# Patient Record
Sex: Female | Born: 1935
Health system: Southern US, Community
[De-identification: ages and names within clinical notes are randomized; demographics above are authoritative.]

## PROBLEM LIST (undated history)

## (undated) DIAGNOSIS — R011 Cardiac murmur, unspecified: Secondary | ICD-10-CM

## (undated) DIAGNOSIS — B019 Varicella without complication: Secondary | ICD-10-CM

## (undated) DIAGNOSIS — I4891 Unspecified atrial fibrillation: Secondary | ICD-10-CM

## (undated) DIAGNOSIS — F32A Depression, unspecified: Secondary | ICD-10-CM

## (undated) DIAGNOSIS — E039 Hypothyroidism, unspecified: Secondary | ICD-10-CM

## (undated) DIAGNOSIS — D649 Anemia, unspecified: Secondary | ICD-10-CM

## (undated) DIAGNOSIS — R001 Bradycardia, unspecified: Secondary | ICD-10-CM

## (undated) DIAGNOSIS — M47816 Spondylosis without myelopathy or radiculopathy, lumbar region: Secondary | ICD-10-CM

## (undated) DIAGNOSIS — K219 Gastro-esophageal reflux disease without esophagitis: Secondary | ICD-10-CM

## (undated) DIAGNOSIS — F329 Major depressive disorder, single episode, unspecified: Secondary | ICD-10-CM

## (undated) DIAGNOSIS — G8929 Other chronic pain: Secondary | ICD-10-CM

## (undated) DIAGNOSIS — R109 Unspecified abdominal pain: Secondary | ICD-10-CM

## (undated) DIAGNOSIS — E785 Hyperlipidemia, unspecified: Secondary | ICD-10-CM

## (undated) DIAGNOSIS — C4490 Unspecified malignant neoplasm of skin, unspecified: Secondary | ICD-10-CM

## (undated) DIAGNOSIS — K5792 Diverticulitis of intestine, part unspecified, without perforation or abscess without bleeding: Secondary | ICD-10-CM

## (undated) DIAGNOSIS — G8918 Other acute postprocedural pain: Secondary | ICD-10-CM

## (undated) DIAGNOSIS — M1990 Unspecified osteoarthritis, unspecified site: Secondary | ICD-10-CM

## (undated) DIAGNOSIS — I10 Essential (primary) hypertension: Secondary | ICD-10-CM

## (undated) DIAGNOSIS — I509 Heart failure, unspecified: Secondary | ICD-10-CM

## (undated) DIAGNOSIS — M7061 Trochanteric bursitis, right hip: Secondary | ICD-10-CM

## (undated) DIAGNOSIS — M5116 Intervertebral disc disorders with radiculopathy, lumbar region: Secondary | ICD-10-CM

## (undated) DIAGNOSIS — S52539A Colles' fracture of unspecified radius, initial encounter for closed fracture: Secondary | ICD-10-CM

## (undated) DIAGNOSIS — M169 Osteoarthritis of hip, unspecified: Secondary | ICD-10-CM

## (undated) HISTORY — DX: Cardiac murmur, unspecified: R01.1

## (undated) HISTORY — DX: Colles' fracture of unspecified radius, initial encounter for closed fracture: S52.539A

## (undated) HISTORY — PX: CATARACT EXTRACTION: SUR2

## (undated) HISTORY — DX: Major depressive disorder, single episode, unspecified: F32.9

## (undated) HISTORY — DX: Intervertebral disc disorders with radiculopathy, lumbar region: M51.16

## (undated) HISTORY — DX: Bradycardia, unspecified: R00.1

## (undated) HISTORY — DX: Other chronic pain: G89.29

## (undated) HISTORY — DX: Anemia, unspecified: D64.9

## (undated) HISTORY — DX: Essential (primary) hypertension: I10

## (undated) HISTORY — DX: Depression, unspecified: F32.A

## (undated) HISTORY — PX: PARTIAL HYSTERECTOMY: SHX80

## (undated) HISTORY — DX: Unspecified osteoarthritis, unspecified site: M19.90

## (undated) HISTORY — DX: Varicella without complication: B01.9

## (undated) HISTORY — DX: Other acute postprocedural pain: G89.18

## (undated) HISTORY — DX: Hyperlipidemia, unspecified: E78.5

## (undated) HISTORY — DX: Diverticulitis of intestine, part unspecified, without perforation or abscess without bleeding: K57.92

## (undated) HISTORY — PX: APPENDECTOMY: SHX54

## (undated) HISTORY — DX: Hypothyroidism, unspecified: E03.9

## (undated) HISTORY — DX: Unspecified atrial fibrillation: I48.91

## (undated) HISTORY — DX: Unspecified malignant neoplasm of skin, unspecified: C44.90

## (undated) HISTORY — DX: Osteoarthritis of hip, unspecified: M16.9

## (undated) HISTORY — DX: Trochanteric bursitis, right hip: M70.61

## (undated) HISTORY — DX: Gastro-esophageal reflux disease without esophagitis: K21.9

## (undated) HISTORY — PX: TONSILLECTOMY: SUR1361

## (undated) HISTORY — DX: Unspecified abdominal pain: R10.9

## (undated) HISTORY — DX: Spondylosis without myelopathy or radiculopathy, lumbar region: M47.816

## (undated) HISTORY — DX: Heart failure, unspecified: I50.9

---

## 2004-09-26 ENCOUNTER — Ambulatory Visit: Payer: Self-pay | Admitting: Family Medicine

## 2005-02-23 ENCOUNTER — Ambulatory Visit: Payer: Self-pay | Admitting: Family Medicine

## 2005-04-16 ENCOUNTER — Ambulatory Visit: Payer: Self-pay | Admitting: Family Medicine

## 2005-10-17 ENCOUNTER — Ambulatory Visit: Payer: Self-pay | Admitting: Ophthalmology

## 2005-10-17 ENCOUNTER — Other Ambulatory Visit: Payer: Self-pay

## 2005-10-19 ENCOUNTER — Ambulatory Visit: Payer: Self-pay | Admitting: Family Medicine

## 2005-10-22 ENCOUNTER — Ambulatory Visit: Payer: Self-pay | Admitting: Family Medicine

## 2005-10-23 ENCOUNTER — Ambulatory Visit: Payer: Self-pay | Admitting: Ophthalmology

## 2005-11-22 ENCOUNTER — Ambulatory Visit: Payer: Self-pay | Admitting: Ophthalmology

## 2005-12-04 ENCOUNTER — Ambulatory Visit: Payer: Self-pay | Admitting: Ophthalmology

## 2006-04-02 ENCOUNTER — Ambulatory Visit: Payer: Self-pay | Admitting: Family Medicine

## 2006-04-17 ENCOUNTER — Ambulatory Visit: Payer: Self-pay | Admitting: Physician Assistant

## 2006-05-13 ENCOUNTER — Ambulatory Visit: Payer: Self-pay | Admitting: General Surgery

## 2006-05-28 ENCOUNTER — Ambulatory Visit: Payer: Self-pay | Admitting: Anesthesiology

## 2006-07-15 ENCOUNTER — Ambulatory Visit: Payer: Self-pay | Admitting: Anesthesiology

## 2006-07-31 ENCOUNTER — Ambulatory Visit: Payer: Self-pay | Admitting: Anesthesiology

## 2006-10-16 ENCOUNTER — Ambulatory Visit: Payer: Self-pay | Admitting: Anesthesiology

## 2006-11-13 ENCOUNTER — Ambulatory Visit: Payer: Self-pay | Admitting: Anesthesiology

## 2007-03-18 ENCOUNTER — Ambulatory Visit: Payer: Self-pay | Admitting: Internal Medicine

## 2007-04-08 ENCOUNTER — Ambulatory Visit: Payer: Self-pay | Admitting: Physical Medicine & Rehabilitation

## 2007-04-10 ENCOUNTER — Ambulatory Visit: Payer: Self-pay | Admitting: Family Medicine

## 2007-04-21 ENCOUNTER — Ambulatory Visit: Payer: Self-pay | Admitting: Physical Medicine & Rehabilitation

## 2007-07-18 ENCOUNTER — Ambulatory Visit: Payer: Self-pay | Admitting: Physical Medicine & Rehabilitation

## 2007-09-03 ENCOUNTER — Ambulatory Visit: Payer: Self-pay | Admitting: Physical Medicine & Rehabilitation

## 2007-12-16 ENCOUNTER — Ambulatory Visit: Payer: Self-pay | Admitting: Internal Medicine

## 2008-04-13 ENCOUNTER — Ambulatory Visit: Payer: Self-pay | Admitting: Internal Medicine

## 2008-06-26 ENCOUNTER — Emergency Department: Payer: Self-pay | Admitting: Emergency Medicine

## 2008-07-20 ENCOUNTER — Ambulatory Visit: Payer: Self-pay | Admitting: Podiatry

## 2009-04-28 ENCOUNTER — Ambulatory Visit: Payer: Self-pay | Admitting: Internal Medicine

## 2009-06-16 ENCOUNTER — Emergency Department: Payer: Self-pay | Admitting: Emergency Medicine

## 2010-02-24 ENCOUNTER — Ambulatory Visit: Payer: Self-pay | Admitting: Internal Medicine

## 2010-05-04 ENCOUNTER — Ambulatory Visit: Payer: Self-pay | Admitting: Internal Medicine

## 2010-07-02 ENCOUNTER — Emergency Department: Payer: Self-pay | Admitting: Emergency Medicine

## 2010-12-19 ENCOUNTER — Ambulatory Visit: Payer: Self-pay

## 2010-12-25 ENCOUNTER — Emergency Department: Payer: Self-pay | Admitting: Emergency Medicine

## 2011-05-22 ENCOUNTER — Ambulatory Visit: Payer: Self-pay | Admitting: Internal Medicine

## 2012-02-08 ENCOUNTER — Ambulatory Visit: Payer: Self-pay | Admitting: Physician Assistant

## 2012-06-11 ENCOUNTER — Ambulatory Visit: Payer: Self-pay | Admitting: Internal Medicine

## 2012-10-21 DIAGNOSIS — Z87898 Personal history of other specified conditions: Secondary | ICD-10-CM | POA: Insufficient documentation

## 2012-10-21 DIAGNOSIS — Z85828 Personal history of other malignant neoplasm of skin: Secondary | ICD-10-CM | POA: Insufficient documentation

## 2012-10-24 ENCOUNTER — Ambulatory Visit: Payer: Self-pay | Admitting: Neurological Surgery

## 2012-12-12 ENCOUNTER — Ambulatory Visit: Payer: Self-pay

## 2013-06-16 ENCOUNTER — Ambulatory Visit: Payer: Self-pay | Admitting: Internal Medicine

## 2013-11-02 ENCOUNTER — Emergency Department: Payer: Self-pay | Admitting: Emergency Medicine

## 2013-11-02 LAB — TROPONIN I: Troponin-I: <0.02 ng/mL

## 2013-11-02 LAB — CBC
HCT: 40.7 % (ref 35.0–47.0)
HGB: 13.4 g/dL (ref 12.0–16.0)
MCH: 32.4 pg (ref 26.0–34.0)
MCHC: 33 g/dL (ref 32.0–36.0)
MCV: 98 fL (ref 80–100)
PLATELETS: 221 10*3/uL (ref 150–440)
RBC: 4.13 10*6/uL (ref 3.80–5.20)
RDW: 14.1 % (ref 11.5–14.5)
WBC: 11.3 10*3/uL — ABNORMAL HIGH (ref 3.6–11.0)

## 2013-11-02 LAB — COMPREHENSIVE METABOLIC PANEL
ALBUMIN: 3.9 g/dL (ref 3.4–5.0)
ALK PHOS: 56 U/L
ALT: 19 U/L (ref 12–78)
Anion Gap: 4 — ABNORMAL LOW (ref 7–16)
BUN: 18 mg/dL (ref 7–18)
Bilirubin,Total: 0.8 mg/dL (ref 0.2–1.0)
CALCIUM: 9 mg/dL (ref 8.5–10.1)
CHLORIDE: 98 mmol/L (ref 98–107)
Co2: 28 mmol/L (ref 21–32)
Creatinine: 0.66 mg/dL (ref 0.60–1.30)
EGFR (African American): >60
EGFR (Non-African Amer.): >60
Glucose: 128 mg/dL — ABNORMAL HIGH (ref 65–99)
OSMOLALITY: 264 (ref 275–301)
Potassium: 3.8 mmol/L (ref 3.5–5.1)
SGOT(AST): 24 U/L (ref 15–37)
Sodium: 130 mmol/L — ABNORMAL LOW (ref 136–145)
TOTAL PROTEIN: 7.6 g/dL (ref 6.4–8.2)

## 2013-11-02 LAB — URINALYSIS, COMPLETE
Bacteria: NONE SEEN
Bilirubin,UR: NEGATIVE
Glucose,UR: NEGATIVE mg/dL (ref 0–75)
KETONE: NEGATIVE
LEUKOCYTE ESTERASE: NEGATIVE
Nitrite: NEGATIVE
PROTEIN: NEGATIVE
Ph: 6 (ref 4.5–8.0)
Specific Gravity: 1.017 (ref 1.003–1.030)
Squamous Epithelial: NONE SEEN
WBC UR: <1 /HPF (ref 0–5)

## 2013-11-02 LAB — LIPASE, BLOOD: Lipase: 181 U/L (ref 73–393)

## 2013-11-18 DIAGNOSIS — M5116 Intervertebral disc disorders with radiculopathy, lumbar region: Secondary | ICD-10-CM

## 2013-11-18 DIAGNOSIS — M5136 Other intervertebral disc degeneration, lumbar region: Secondary | ICD-10-CM | POA: Insufficient documentation

## 2013-11-18 DIAGNOSIS — M7061 Trochanteric bursitis, right hip: Secondary | ICD-10-CM

## 2013-11-18 DIAGNOSIS — M47816 Spondylosis without myelopathy or radiculopathy, lumbar region: Secondary | ICD-10-CM

## 2013-11-18 HISTORY — DX: Spondylosis without myelopathy or radiculopathy, lumbar region: M47.816

## 2013-11-18 HISTORY — DX: Intervertebral disc disorders with radiculopathy, lumbar region: M51.16

## 2013-11-18 HISTORY — DX: Trochanteric bursitis, right hip: M70.61

## 2014-01-16 DIAGNOSIS — F32A Depression, unspecified: Secondary | ICD-10-CM | POA: Insufficient documentation

## 2014-01-16 DIAGNOSIS — F329 Major depressive disorder, single episode, unspecified: Secondary | ICD-10-CM | POA: Insufficient documentation

## 2014-01-16 DIAGNOSIS — F419 Anxiety disorder, unspecified: Secondary | ICD-10-CM

## 2014-01-16 DIAGNOSIS — G8929 Other chronic pain: Secondary | ICD-10-CM

## 2014-01-16 DIAGNOSIS — F3341 Major depressive disorder, recurrent, in partial remission: Secondary | ICD-10-CM | POA: Insufficient documentation

## 2014-01-16 HISTORY — DX: Other chronic pain: G89.29

## 2014-02-05 ENCOUNTER — Inpatient Hospital Stay: Payer: Self-pay | Admitting: Internal Medicine

## 2014-02-05 LAB — URINALYSIS, COMPLETE
BLOOD: NEGATIVE
Bacteria: NONE SEEN
Bilirubin,UR: NEGATIVE
GLUCOSE, UR: NEGATIVE mg/dL (ref 0–75)
Leukocyte Esterase: NEGATIVE
NITRITE: NEGATIVE
PH: 5 (ref 4.5–8.0)
Protein: 30
Specific Gravity: 1.025 (ref 1.003–1.030)
Squamous Epithelial: <1
WBC UR: <1 /HPF (ref 0–5)

## 2014-02-05 LAB — BASIC METABOLIC PANEL
Anion Gap: 11 (ref 7–16)
BUN: 24 mg/dL — ABNORMAL HIGH (ref 7–18)
CO2: 22 mmol/L (ref 21–32)
CREATININE: 1 mg/dL (ref 0.60–1.30)
Calcium, Total: 9 mg/dL (ref 8.5–10.1)
Chloride: 100 mmol/L (ref 98–107)
EGFR (African American): 59 — ABNORMAL LOW
GFR CALC NON AF AMER: 51 — AB
GLUCOSE: 241 mg/dL — AB (ref 65–99)
OSMOLALITY: 278 (ref 275–301)
POTASSIUM: 4.7 mmol/L (ref 3.5–5.1)
SODIUM: 133 mmol/L — AB (ref 136–145)

## 2014-02-05 LAB — CBC
HCT: 41.8 % (ref 35.0–47.0)
HGB: 13.3 g/dL (ref 12.0–16.0)
MCH: 32.3 pg (ref 26.0–34.0)
MCHC: 31.8 g/dL — ABNORMAL LOW (ref 32.0–36.0)
MCV: 102 fL — ABNORMAL HIGH (ref 80–100)
PLATELETS: 242 10*3/uL (ref 150–440)
RBC: 4.12 10*6/uL (ref 3.80–5.20)
RDW: 13.6 % (ref 11.5–14.5)
WBC: 10.9 10*3/uL (ref 3.6–11.0)

## 2014-02-05 LAB — PROTIME-INR
INR: 1.3
Prothrombin Time: 15.5 secs — ABNORMAL HIGH (ref 11.5–14.7)

## 2014-02-05 LAB — CK TOTAL AND CKMB (NOT AT ARMC)
CK, TOTAL: 31 U/L
CK, TOTAL: 31 U/L
CK, Total: 66 U/L
CK-MB: 1.8 ng/mL (ref 0.5–3.6)
CK-MB: 2.1 ng/mL (ref 0.5–3.6)
CK-MB: 2.2 ng/mL (ref 0.5–3.6)

## 2014-02-05 LAB — TROPONIN I
Troponin-I: <0.02 ng/mL
Troponin-I: <0.02 ng/mL

## 2014-02-05 LAB — TSH: Thyroid Stimulating Horm: 3.54 u[IU]/mL

## 2014-02-05 LAB — APTT

## 2014-02-06 DIAGNOSIS — I059 Rheumatic mitral valve disease, unspecified: Secondary | ICD-10-CM

## 2014-02-06 LAB — CBC WITH DIFFERENTIAL/PLATELET
BASOS PCT: 0.4 %
Basophil #: 0 10*3/uL (ref 0.0–0.1)
Eosinophil #: 0.1 10*3/uL (ref 0.0–0.7)
Eosinophil %: 0.7 %
HCT: 35.4 % (ref 35.0–47.0)
HGB: 11.9 g/dL — ABNORMAL LOW (ref 12.0–16.0)
LYMPHS ABS: 2.4 10*3/uL (ref 1.0–3.6)
Lymphocyte %: 27.3 %
MCH: 33 pg (ref 26.0–34.0)
MCHC: 33.7 g/dL (ref 32.0–36.0)
MCV: 98 fL (ref 80–100)
Monocyte #: 0.8 x10 3/mm (ref 0.2–0.9)
Monocyte %: 8.4 %
NEUTROS ABS: 5.7 10*3/uL (ref 1.4–6.5)
NEUTROS PCT: 63.2 %
Platelet: 204 10*3/uL (ref 150–440)
RBC: 3.61 10*6/uL — ABNORMAL LOW (ref 3.80–5.20)
RDW: 13.6 % (ref 11.5–14.5)
WBC: 9 10*3/uL (ref 3.6–11.0)

## 2014-02-06 LAB — BASIC METABOLIC PANEL
Anion Gap: 7 (ref 7–16)
BUN: 22 mg/dL — ABNORMAL HIGH (ref 7–18)
Calcium, Total: 8.1 mg/dL — ABNORMAL LOW (ref 8.5–10.1)
Chloride: 100 mmol/L (ref 98–107)
Co2: 28 mmol/L (ref 21–32)
Creatinine: 0.73 mg/dL (ref 0.60–1.30)
EGFR (African American): >60
GLUCOSE: 83 mg/dL (ref 65–99)
OSMOLALITY: 273 (ref 275–301)
POTASSIUM: 3.3 mmol/L — AB (ref 3.5–5.1)
Sodium: 135 mmol/L — ABNORMAL LOW (ref 136–145)

## 2014-02-07 LAB — CBC WITH DIFFERENTIAL/PLATELET
BASOS ABS: 0 10*3/uL (ref 0.0–0.1)
BASOS PCT: 0.4 %
EOS PCT: 0.8 %
Eosinophil #: 0.1 10*3/uL (ref 0.0–0.7)
HCT: 36.9 % (ref 35.0–47.0)
HGB: 12.6 g/dL (ref 12.0–16.0)
LYMPHS ABS: 2.4 10*3/uL (ref 1.0–3.6)
Lymphocyte %: 22.6 %
MCH: 33.7 pg (ref 26.0–34.0)
MCHC: 34 g/dL (ref 32.0–36.0)
MCV: 99 fL (ref 80–100)
Monocyte #: 1 x10 3/mm — ABNORMAL HIGH (ref 0.2–0.9)
Monocyte %: 9.4 %
NEUTROS ABS: 7 10*3/uL — AB (ref 1.4–6.5)
NEUTROS PCT: 66.8 %
PLATELETS: 232 10*3/uL (ref 150–440)
RBC: 3.73 10*6/uL — ABNORMAL LOW (ref 3.80–5.20)
RDW: 13.5 % (ref 11.5–14.5)
WBC: 10.5 10*3/uL (ref 3.6–11.0)

## 2014-02-07 LAB — BASIC METABOLIC PANEL
Anion Gap: 10 (ref 7–16)
BUN: 21 mg/dL — ABNORMAL HIGH (ref 7–18)
CALCIUM: 8.5 mg/dL (ref 8.5–10.1)
CREATININE: 0.59 mg/dL — AB (ref 0.60–1.30)
Chloride: 99 mmol/L (ref 98–107)
Co2: 28 mmol/L (ref 21–32)
EGFR (African American): >60
Glucose: 89 mg/dL (ref 65–99)
OSMOLALITY: 276 (ref 275–301)
POTASSIUM: 3.9 mmol/L (ref 3.5–5.1)
SODIUM: 137 mmol/L (ref 136–145)

## 2014-02-08 LAB — CBC WITH DIFFERENTIAL/PLATELET
BASOS ABS: 0.1 10*3/uL (ref 0.0–0.1)
Basophil %: 0.8 %
Eosinophil #: 0.1 10*3/uL (ref 0.0–0.7)
Eosinophil %: 0.8 %
HCT: 36.5 % (ref 35.0–47.0)
HGB: 12.2 g/dL (ref 12.0–16.0)
Lymphocyte #: 1.5 10*3/uL (ref 1.0–3.6)
Lymphocyte %: 16.8 %
MCH: 33 pg (ref 26.0–34.0)
MCHC: 33.3 g/dL (ref 32.0–36.0)
MCV: 99 fL (ref 80–100)
MONO ABS: 0.7 x10 3/mm (ref 0.2–0.9)
Monocyte %: 8 %
NEUTROS ABS: 6.7 10*3/uL — AB (ref 1.4–6.5)
Neutrophil %: 73.6 %
Platelet: 235 10*3/uL (ref 150–440)
RBC: 3.69 10*6/uL — AB (ref 3.80–5.20)
RDW: 13.4 % (ref 11.5–14.5)
WBC: 9.1 10*3/uL (ref 3.6–11.0)

## 2014-02-08 LAB — BASIC METABOLIC PANEL
Anion Gap: 7 (ref 7–16)
BUN: 14 mg/dL (ref 7–18)
CALCIUM: 8.3 mg/dL — AB (ref 8.5–10.1)
CREATININE: 0.57 mg/dL — AB (ref 0.60–1.30)
Chloride: 99 mmol/L (ref 98–107)
Co2: 31 mmol/L (ref 21–32)
EGFR (Non-African Amer.): >60
GLUCOSE: 101 mg/dL — AB (ref 65–99)
Osmolality: 274 (ref 275–301)
Potassium: 4 mmol/L (ref 3.5–5.1)
SODIUM: 137 mmol/L (ref 136–145)

## 2014-02-09 LAB — BASIC METABOLIC PANEL
ANION GAP: 8 (ref 7–16)
BUN: 19 mg/dL — ABNORMAL HIGH (ref 7–18)
CALCIUM: 8.4 mg/dL — AB (ref 8.5–10.1)
CREATININE: 0.62 mg/dL (ref 0.60–1.30)
Chloride: 99 mmol/L (ref 98–107)
Co2: 34 mmol/L — ABNORMAL HIGH (ref 21–32)
EGFR (Non-African Amer.): >60
GLUCOSE: 95 mg/dL (ref 65–99)
Osmolality: 283 (ref 275–301)
Potassium: 3.5 mmol/L (ref 3.5–5.1)
Sodium: 141 mmol/L (ref 136–145)

## 2014-02-09 LAB — CBC WITH DIFFERENTIAL/PLATELET
BASOS PCT: 0.6 %
Basophil #: 0 10*3/uL (ref 0.0–0.1)
Eosinophil #: 0.1 10*3/uL (ref 0.0–0.7)
Eosinophil %: 2 %
HCT: 36.2 % (ref 35.0–47.0)
HGB: 11.7 g/dL — AB (ref 12.0–16.0)
Lymphocyte #: 2.1 10*3/uL (ref 1.0–3.6)
Lymphocyte %: 28.3 %
MCH: 32.3 pg (ref 26.0–34.0)
MCHC: 32.4 g/dL (ref 32.0–36.0)
MCV: 100 fL (ref 80–100)
Monocyte #: 0.7 x10 3/mm (ref 0.2–0.9)
Monocyte %: 8.8 %
Neutrophil #: 4.5 10*3/uL (ref 1.4–6.5)
Neutrophil %: 60.3 %
PLATELETS: 248 10*3/uL (ref 150–440)
RBC: 3.63 10*6/uL — ABNORMAL LOW (ref 3.80–5.20)
RDW: 13.7 % (ref 11.5–14.5)
WBC: 7.5 10*3/uL (ref 3.6–11.0)

## 2014-02-10 LAB — CBC WITH DIFFERENTIAL/PLATELET
Basophil #: 0.1 10*3/uL (ref 0.0–0.1)
Basophil %: 0.8 %
EOS ABS: 0.2 10*3/uL (ref 0.0–0.7)
Eosinophil %: 2.1 %
HCT: 36.1 % (ref 35.0–47.0)
HGB: 11.7 g/dL — AB (ref 12.0–16.0)
Lymphocyte #: 2.2 10*3/uL (ref 1.0–3.6)
Lymphocyte %: 30.3 %
MCH: 32.3 pg (ref 26.0–34.0)
MCHC: 32.5 g/dL (ref 32.0–36.0)
MCV: 99 fL (ref 80–100)
MONO ABS: 0.6 x10 3/mm (ref 0.2–0.9)
Monocyte %: 8.6 %
Neutrophil #: 4.2 10*3/uL (ref 1.4–6.5)
Neutrophil %: 58.2 %
PLATELETS: 276 10*3/uL (ref 150–440)
RBC: 3.64 10*6/uL — AB (ref 3.80–5.20)
RDW: 13.8 % (ref 11.5–14.5)
WBC: 7.2 10*3/uL (ref 3.6–11.0)

## 2014-02-10 LAB — BASIC METABOLIC PANEL
Anion Gap: 9 (ref 7–16)
BUN: 19 mg/dL — ABNORMAL HIGH (ref 7–18)
CALCIUM: 8.5 mg/dL (ref 8.5–10.1)
Chloride: 96 mmol/L — ABNORMAL LOW (ref 98–107)
Co2: 34 mmol/L — ABNORMAL HIGH (ref 21–32)
Creatinine: 0.65 mg/dL (ref 0.60–1.30)
GLUCOSE: 97 mg/dL (ref 65–99)
OSMOLALITY: 280 (ref 275–301)
POTASSIUM: 4 mmol/L (ref 3.5–5.1)
Sodium: 139 mmol/L (ref 136–145)

## 2014-02-11 LAB — CBC WITH DIFFERENTIAL/PLATELET
Basophil #: 0.1 10*3/uL (ref 0.0–0.1)
Basophil %: 0.8 %
Eosinophil #: 0.2 10*3/uL (ref 0.0–0.7)
Eosinophil %: 2.1 %
HCT: 37.9 % (ref 35.0–47.0)
HGB: 12.2 g/dL (ref 12.0–16.0)
Lymphocyte #: 2.6 10*3/uL (ref 1.0–3.6)
Lymphocyte %: 30.5 %
MCH: 31.8 pg (ref 26.0–34.0)
MCHC: 32.2 g/dL (ref 32.0–36.0)
MCV: 99 fL (ref 80–100)
Monocyte #: 0.7 x10 3/mm (ref 0.2–0.9)
Monocyte %: 8.2 %
NEUTROS ABS: 4.9 10*3/uL (ref 1.4–6.5)
NEUTROS PCT: 58.4 %
Platelet: 295 10*3/uL (ref 150–440)
RBC: 3.83 10*6/uL (ref 3.80–5.20)
RDW: 13.5 % (ref 11.5–14.5)
WBC: 8.4 10*3/uL (ref 3.6–11.0)

## 2014-02-11 LAB — BASIC METABOLIC PANEL
Anion Gap: 3 — ABNORMAL LOW (ref 7–16)
BUN: 20 mg/dL — ABNORMAL HIGH (ref 7–18)
CALCIUM: 8.3 mg/dL — AB (ref 8.5–10.1)
Chloride: 98 mmol/L (ref 98–107)
Co2: 34 mmol/L — ABNORMAL HIGH (ref 21–32)
Creatinine: 0.7 mg/dL (ref 0.60–1.30)
EGFR (Non-African Amer.): >60
Glucose: 99 mg/dL (ref 65–99)
OSMOLALITY: 273 (ref 275–301)
POTASSIUM: 3.9 mmol/L (ref 3.5–5.1)
SODIUM: 135 mmol/L — AB (ref 136–145)

## 2014-04-02 ENCOUNTER — Ambulatory Visit: Payer: Self-pay | Admitting: Family

## 2014-04-12 ENCOUNTER — Emergency Department: Payer: Self-pay | Admitting: Emergency Medicine

## 2014-08-19 DIAGNOSIS — M169 Osteoarthritis of hip, unspecified: Secondary | ICD-10-CM

## 2014-08-19 HISTORY — DX: Osteoarthritis of hip, unspecified: M16.9

## 2014-10-23 NOTE — Discharge Summary (Signed)
PATIENT NAME:  Amanda Soto, Amanda Soto MR#:  [id number] DATE OF BIRTH:  Feb 02, 1925  DATE OF ADMISSION:  02/05/2014 DATE OF DISCHARGE:  02/11/2014  DISCHARGE DIAGNOSES:  1.  Congestive heart failure, acute, systolic.  2.  Rapid atrial fibrillation, primarily responsible for the above.  3.  Osteoarthritis and generalized weakness noted, longstanding.  4.  Leg lesion, possible squamous cell cancer. Biopsy results pending, per surgery.   DISCHARGE MEDICATIONS: Per [facility] med reconciliation system. Basically, will be on Eliquis 2.5 mg  b.i.d., Toprol XL 50 mg daily, amiodarone 400 mg b.i.d. which we lowered to 400 daily after 5 days and likely 200 daily soon. She will also be on Lasix 20 mg daily, which may need to be stopped soon, and that will be checked soon by home health or we will do it in my office when she follows up next week if they cannot do it prior to that.   HISTORY AND PHYSICAL: Please see detailed history and physical done on admission.   HOSPITAL COURSE: Admitted with rapid atrial fibrillation and shortness of breath. She was diuresed somewhat, she was put on oxygen. Initial admitting doctor had trouble controlling her heart rate with diltiazem and metoprolol given hypotension. Therefore, diltiazem had to be stopped. Amiodarone was added and has worked nicely. Heart rate is now in the 80s, still irregular, however. She wished to try Eliquis instead of warfarin given blood drawn demands, etc. She ambulated 180 feet, felt to be up to going home with home health given that ability. Her echocardiogram showed a left ventricular ejection fraction of 35-40%. She has a severely dilated left atrium with poor windows, given her body habitus, etc. She did rule out for MI by troponins. Urinalysis was basically negative. TSH was normal as well.   Of note, it took approximately 35 minutes to do all discharge tasks today.    ____________________________ Ocie Cornfield. Ouida Sills, MD mwa:lt D: 02/11/2014 07:38:49  ET T: 02/11/2014 08:49:30 ET JOB#: [id number]  cc: Ocie Cornfield. Ouida Sills, MD, <Dictator> Kirk Ruths MD ELECTRONICALLY SIGNED 02/12/2014 8:12

## 2014-10-23 NOTE — H&P (Signed)
PATIENT NAME:  Amanda Soto, Amanda Soto MR#:  [id number] DATE OF BIRTH:  08-22-1924  DATE OF ADMISSION:  02/05/2014  PRIMARY CARE PHYSICIAN:  Kirk Ruths, MD   CHIEF COMPLAINT: Shortness of breath, nausea, vomiting.   HISTORY OF PRESENT ILLNESS: This is a very pleasant 81 year old woman with past medical history of hypothyroidism, recently right leg wound, hypertension, hyperlipidemia, presents after feeling poorly for several days due to nausea, vomiting, decreased p.o. intake. Symptoms escalated to a profound weakness upon waking and extreme dyspnea with any activity this morning. She is accompanied by her daughter-in-law at the time of interview. She denies any recent fevers, chills, sweats, chest pain, or syncope.   PAST MEDICAL HISTORY:  Problem:  1.  Hypothyroidism.  2.  Hypertension.  3.  Chronic back pain.  4.  Right leg wound, this was recently biopsied by surgery. She has a history of skin cancer on the left leg. There is some concern of possible MRSA infection.   PAST SURGICAL HISTORY: 1.  Hysterectomy.  2.  Appendectomy.  3.  Tonsillectomy.   HOME MEDICATIONS:  1.  Vitamin D3 of 2000 units 1 tablet daily.  2.  Tramadol 50 mg 2 tablets 3 times a day as needed for pain.  3.  Tylenol extra strength 2 tablets twice a day as needed for pain.  4.  Synthroid 50 mcg 1 tablet orally once a day.  5.  Ocuvite antioxidant tablet 1 tablet once a day.  6.  Metoprolol tartrate 50 mg 1 tablet 2 times a day.  7.  Clindamycin 150 mg orally 3 times a day.  8.  Atorvastatin 10 mg 1 tablet once a day.  9.  Aspirin 81 mg 1 tablet daily.   ALLERGIES:  The patient has no known allergies.   SOCIAL HISTORY:  The patient lives alone. She is accompanied by her daughter-in-law. They deny any smoking, alcohol, or illicit substances. She reports that she walks at home without assistance of a cane or walker. She does not use oxygen at home. She performs all of her own activities of daily living.    FAMILY HISTORY:  Noncontributory.   REVIEW OF SYSTEMS:  GENERAL:  Positive for fatigue, shortness of breath, negative for fevers, chills, weight change.  HEENT:  Negative for change in vision, eye pain, change in hearing, ear pain, difficulty swallowing.  PULMONARY:  Positive for shortness of breath, positive for wheezing, negative for cough, sputum, hemoptysis.  CARDIOVASCULAR:  Negative for chest pain, positive for palpitations, negative for syncope, negative for edema.  ABDOMEN:  Positive for nausea, vomiting, negative for diarrhea, abdominal pain, hematochezia, or hematemesis.  MUSCULOSKELETAL:  Negative for recent trauma, swollen or tender joints, muscle weakness.  NEUROLOGIC:  Negative for seizure, headache, syncope.  PSYCHIATRIC:  Negative for any new onset depression or anxiety.   PHYSICAL EXAMINATION: VITAL SIGNS: Temperature 98.1, pulse 86, respirations 20, blood pressure 126/70, pulse oximetry 96% on 2 liters.  GENERAL:  The patient is uncomfortable, resting in bed, does seem to be having some difficulty with rapid respiration.   HEENT:  Pupils are equal, round, and reactive; conjunctivae are clear, extraocular motion is intact. Oral mucous membranes are pink and moist; oropharynx is clear with no exudate or ulcer, trachea is midline, no cervical lymphadenopathy, no thyromegaly.  PULMONARY:  There are bibasilar crackles to the mid lung fields, no wheezes, no rhonchi, fair air movement with rapid respirations.  CARDIOVASCULAR:  Irregular, rate controlled, 3/6 systolic ejection murmur, peripheral pulses are 1+,  there is no edema.  ABDOMEN:  Bowel sounds are positive. Abdomen is soft, nontender, no guarding, no rebound, no mass; no hepatosplenomegaly.  MUSCULOSKELETAL:  No tender or swollen joints. Range of motion is normal in all joints; moves all 4 extremities without difficulty.  NEUROLOGIC:  Cranial nerves II through XII are grossly intact, neurologic exam is nonfocal.   PSYCHIATRIC:  The patient is anxious, affect is appropriate.   LABORATORY:  Troponin less than 0.02, sodium 133, potassium 4.7, chloride 100, bicarbonate 22, BUN 24, creatinine 1.0, glucose 241,000, thyroid stimulating hormone is 3.5, white blood cells 10.9, hemoglobin 13.3, platelets 242,000, MCV is 102, INR is 1.3, urinalysis is negative for signs of infection.   IMAGING:  Chest x-ray shows cardiomegaly, bilateral perihilar and lower lobe opacities most compatible with edema/congestive heart failure, there are layering bilateral effusions.   ASSESSMENT AND PLAN: 1.  New onset atrial fibrillation with rapid ventricular rate: She has received diltiazem in the Emergency Room and now started on oral diltiazem with good rate control so far. Also continue home metoprolol. A 2D echocardiogram is ordered. Will need to discuss anticoagulation; she has a high CHADS score with risk factors of age, hypertension.  2.  Pulmonary edema most likely due to uncontrolled atrial fibrillation with rapid ventricular rate: Congestive heart failure is possible, 2D echocardiogram is pending. She has received Lasix 40 mg IV x 1.  3.  Acute respiratory failure with hypoxia due to numbers 1 and 2: She was initially on BiPAP in the Emergency Room, now doing much better after rate control and diuresis. She is currently on nasal cannula with oxygen saturation of 95%. We will continue oxygen and titrate for sats over 90%.  4.  Right leg wound: Possible skin cancer given history of skin cancer on the left leg. She had been treated with clindamycin for possible methicillin resistant Staphylococcus aureus infection. I am not sure where she is in the course of antibiotic treatment. She states that this antibiotic has been causing profound nausea and vomiting so we will hold off for now.  5.  Hypothyroidism: Check thyroid stimulating hormone.  6.  Hypertension: Continue metoprolol.  7.  Back pain: Continue tramadol.   TIME SPENT THIS  ADMISSION:  Was 45 minutes.    ____________________________ Earleen Newport. Volanda Napoleon, MD cpw:nt D: 02/05/2014 21:08:01 ET T: 02/05/2014 22:10:02 ET JOB#: [id number]  cc: Barnetta Chapel P. Volanda Napoleon, MD, <Dictator> Aldean Jewett MD ELECTRONICALLY SIGNED 02/13/2014 13:56

## 2014-10-23 NOTE — Consult Note (Signed)
Chief Complaint:  Subjective/Chief Complaint Patient still has palpitation shortness of breath with weakness slightly better than yesterday   VITAL SIGNS/ANCILLARY NOTES: **Vital Signs.:   10-Aug-15 11:15  Vital Signs Type Routine  Temperature Temperature (F) 98.1  Celsius 36.7  Temperature Source oral  Pulse Pulse 117  Respirations Respirations 20  Systolic BP Systolic BP 826  Diastolic BP (mmHg) Diastolic BP (mmHg) 81  Mean BP 90  Pulse Ox % Pulse Ox % 96  Pulse Ox Activity Level  At rest  Oxygen Delivery 1L  *Intake and Output.:   Daily 10-Aug-15 07:00  Grand Totals Intake:  480 Output:  2050    Net:  -4158 30 Hr.:  -[id number]  Oral Intake      In:  480  Urine ml     Out:  2050  Length of Stay Totals Intake:  600 Output:  3940    Net:  -6808   Brief Assessment:  GEN well developed, well nourished, no acute distress   Cardiac Irregular  murmur present   Respiratory normal resp effort  rhonchi   Gastrointestinal Normal   Gastrointestinal details normal Soft   EXTR negative cyanosis/clubbing, negative edema   Lab Results: Routine Chem:  10-Aug-15 05:09   Glucose, Serum  101  BUN 14  Creatinine (comp)  0.57  Sodium, Serum 137  Potassium, Serum 4.0  Chloride, Serum 99  CO2, Serum 31  Calcium (Total), Serum  8.3  Anion Gap 7  Osmolality (calc) 274  eGFR (African American) >60  eGFR (Non-African American) >60 (eGFR values <43m/min/1.73 m2 may be an indication of chronic kidney disease (CKD). Calculated eGFR is useful in patients with stable renal function. The eGFR calculation will not be reliable in acutely ill patients when serum creatinine is changing rapidly. It is not useful in  patients on dialysis. The eGFR calculation may not be applicable to patients at the low and high extremes of body sizes, pregnant women, and vegetarians.)  Routine Hem:  10-Aug-15 05:09   WBC (CBC) 9.1  RBC (CBC)  3.69  Hemoglobin (CBC) 12.2  Hematocrit (CBC) 36.5   Platelet Count (CBC) 235  MCV 99  MCH 33.0  MCHC 33.3  RDW 13.4  Neutrophil % 73.6  Lymphocyte % 16.8  Monocyte % 8.0  Eosinophil % 0.8  Basophil % 0.8  Neutrophil #  6.7  Lymphocyte # 1.5  Monocyte # 0.7  Eosinophil # 0.1  Basophil # 0.1 (Result(s) reported on 08 Feb 2014 at 05:41AM.)   Radiology Results: XRay:    07-Aug-15 14:09, Chest Portable Single View  Chest Portable Single View   REASON FOR EXAM:    sob  COMMENTS:       PROCEDURE: DXR - DXR PORTABLE CHEST SINGLE VIEW  - Feb 05 2014  2:09PM     CLINICAL DATA:  Shortness of breath, low O2 sats.    EXAM:  PORTABLE CHEST - 1 VIEW    COMPARISON:  11/02/2013    FINDINGS:  Cardiomegaly. Bilateral perihilar and lower lobe opacities most  compatible with edema/ CHF. Layering bilateral effusions. No acute  bony abnormality.     IMPRESSION:  Moderate CHF.  Bilateral effusions.      Electronically Signed    By: KRolm BaptiseM.D.    On: 02/05/2014 14:11         Verified By: KRaelyn Number M.D.,  Cardiology:    07-Aug-15 13:33, ED ECG  Ventricular Rate 125  Atrial Rate 97  QRS  Duration 134  QT 358  QTc 516  R Axis -22  T Axis 139  ECG interpretation   Atrial fibrillation with rapid ventricular response  Left bundle branch block  Abnormal ECG  When compared with ECG of 02-Nov-2013 11:14,  Atrial fibrillation has replaced Sinus rhythm  Vent. rate has increased BY  44 BPM  Left bundle branch block has replaced Non-specific intra-ventricular conduction block  Minimal criteria for Anterior infarct are no longer Present  Criteria for Inferior infarct are no longer Present  ----------unconfirmed----------  Confirmed by OVERREAD, NOT ([id number]), editor PEARSON, BARBARA ([id number]) on 02/09/2014 9:39:21 AM  ED ECG     08-Aug-15 09:00, Echo Doppler  Echo Doppler   REASON FOR EXAM:      COMMENTS:       PROCEDURE: [place] - ECHO DOPPLER COMPLETE(TRANSTHOR)  - Feb 06 2014  9:00AM     RESULT: Echocardiogram  Report    Patient Name:   Amanda Soto Date of Exam: 02/06/2014  Medical Rec #:  [id number]        Custom1:  Date of Birth:  07/02/1925     Height:       59.0 in  Patient Age:    81 years      Weight:       124.0 lb  Patient Gender: F             BSA:          1.50 m??    Indications: Atrial Fib  Sonographer:    Arville Go RDCS  Referring Phys: Frazier Richards, W    Summary:   1. Left ventricular ejection fraction, by visual estimation, is 35 to   40%.   2. Mildly increased left ventricular septal thickness.   3. Decreased left ventricular internal cavity size.   4. Severely dilated left atrium.   5. Mild mitral valve regurgitation.   6. Mild to moderate aortic valve sclerosis/calcification without any   evidence of aortic stenosis.   7. Mildly elevated pulmonary artery systolic pressure.   8. Mild to moderate tricuspid regurgitation.   9. Moderately increased left ventricular posterior wall thickness.  10. Poor acoustic windows limit study Difficult to fully evaluate     regional wall motion as endocardium is hard to see.  2D AND M-MODE MEASUREMENTS (normal ranges within parentheses):  Left Ventricle:          Normal  IVSd (2D):      1.44 cm (0.7-1.1)  LVPWd (2D):     1.33 cm (0.7-1.1) Aorta/LA:                  Normal  LVIDd (2D):     2.86 cm (3.4-5.7) Aortic Root (2D): 2.50 cm (2.4-3.7)  LVIDs (2D):     2.16 cm           Left Atrium (2D): 5.30 cm (1.9-4.0)  LV FS (2D):     24.5 %   (>25%)  LV EF (2D):     50.3 %   (>50%)                                    Right Ventricle:                                    RVd (2D):  LV  DIASTOLIC FUNCTION:  MV Peak E: 1.27 m/s  SPECTRAL DOPPLER ANALYSIS (where applicable):  Aortic Valve: AoV Max Vel: 1.52 m/s AoV Peak PG: 9.2 mmHg AoV Mean PG:  LVOT Vmax: 0.67 m/s LVOT VTI:  LVOT Diameter: 1.90 cm  AoV Area, Vmax: 1.25 cm?? AoV Area, VTI:  AoV Area, Vmn:  Tricuspid Valve and PA/RV Systolic Pressure: TR Max Velocity: 2.92 m/s RA    Pressure: 10 mmHg RVSP/PASP: 44.2 mmHg  Pulmonic Valve:  PV Max Velocity: 0.94 m/s PV Max PG: 3.5 mmHg PV Mean PG:    PHYSICIAN INTERPRETATION:  Left Ventricle: The left ventricular internal cavity size was decreased.   LV septal wall thickness was mildly increased. LV posterior wall   thickness was moderately increased. Left ventricular ejection fraction,   by visual estimation, is 35 to 40%.  Right Ventricle: The right ventricular size is normal. Global RV systolic   function is normal.  Left Atrium: The left atrium is severely dilated.  Right Atrium: The right atrium is normal in size.  Pericardium: There is no evidence of pericardial effusion.  Mitral Valve: Mild mitral valve regurgitation is seen.  Tricuspid Valve: The tricuspid valve is normal. Mild to moderate   tricuspid regurgitation is visualized. The tricuspid regurgitant velocity   is 2.92 m/s, and with an assumed right atrial pressure of 10 mmHg, the   estimated right ventricular systolic pressureis mildly elevated at 44.2   mmHg.  Aortic Valve: Mild to moderate aortic valve sclerosis/calcification is   present, without any evidence of aortic stenosis.  Pulmonic Valve: The pulmonic valve is not well seen. No indication of   pulmonic valve regurgitation.  Venous: The inferior vena cava was dilated with respiratory size   variation less than 50%.  [id number] Dorris Carnes MD  Electronically signed by [id number] Dorris Carnes MD  Signature Date/Time: 02/06/2014/1:50:53 PM    *** Final ***    IMPRESSION: .        Verified By: Fay Records, M.D., MD   Assessment/Plan:  Assessment/Plan:  Assessment IMP  atrial fibrillation  Congestive Heart Failure  Chronic obstructive pulmonary disease  cardiomyopathy  weakness  chronic back pain  shortness of breath .   Plan PLAN  continue Lasix therapy for failure  recommend amiodarone for atrial fibrillation  recommend long-term anticoagulation for AFib  continue pain management  for chronic pain  ACE-inhibitor for cardiomyopathy  rate control for AFib  physical therapy   Electronic Signatures: Lujean Amel D (MD)  (Signed 30-Aug-15 22:42)  Authored: Chief Complaint, VITAL SIGNS/ANCILLARY NOTES, Brief Assessment, Lab Results, Radiology Results, Assessment/Plan   Last Updated: 30-Aug-15 22:42 by Yolonda Kida (MD)

## 2014-10-23 NOTE — Consult Note (Signed)
Chief Complaint:  Subjective/Chief Complaint Patient states improved palpitations tachycardia she does not feel short of breath today has more energy   VITAL SIGNS/ANCILLARY NOTES: **Vital Signs.:   12-Aug-15 11:37  Temperature Temperature (F) 98.9  Celsius 37.1  Temperature Source oral  Pulse Pulse 98  Respirations Respirations 19  Systolic BP Systolic BP 540  Diastolic BP (mmHg) Diastolic BP (mmHg) 78  Mean BP 89  Pulse Ox % Pulse Ox % 94  Pulse Ox Activity Level  At rest  Oxygen Delivery Room Air/ 21 %  *Intake and Output.:   12-Aug-15 13:03  Grand Totals Intake:   Output:  400    Net:  -400 24 Hr.:  -1300  Urine ml     Out:  400  Urinary Method  Void; BSC   Brief Assessment:  GEN well developed, well nourished, no acute distress   Cardiac Irregular   Respiratory normal resp effort   Gastrointestinal Normal   Gastrointestinal details normal Soft   EXTR negative cyanosis/clubbing, negative edema   Lab Results: Routine Chem:  10-Aug-15 05:09   Glucose, Serum  101  BUN 14  Creatinine (comp)  0.57  Sodium, Serum 137  Potassium, Serum 4.0  Chloride, Serum 99  CO2, Serum 31  Calcium (Total), Serum  8.3  Anion Gap 7  Osmolality (calc) 274  eGFR (African American) >60  eGFR (Non-African American) >60 (eGFR values <73m/min/1.73 m2 may be an indication of chronic kidney disease (CKD). Calculated eGFR is useful in patients with stable renal function. The eGFR calculation will not be reliable in acutely ill patients when serum creatinine is changing rapidly. It is not useful in  patients on dialysis. The eGFR calculation may not be applicable to patients at the low and high extremes of body sizes, pregnant women, and vegetarians.)  12-Aug-15 04:57   Glucose, Serum 97  BUN  19  Creatinine (comp) 0.65  Sodium, Serum 139  Potassium, Serum 4.0  Chloride, Serum  96  CO2, Serum  34  Calcium (Total), Serum 8.5  Anion Gap 9  Osmolality (calc) 280  eGFR  (African American) >60  eGFR (Non-African American) >60 (eGFR values <674mmin/1.73 m2 may be an indication of chronic kidney disease (CKD). Calculated eGFR is useful in patients with stable renal function. The eGFR calculation will not be reliable in acutely ill patients when serum creatinine is changing rapidly. It is not useful in  patients on dialysis. The eGFR calculation may not be applicable to patients at the low and high extremes of body sizes, pregnant women, and vegetarians.)  Routine Hem:  10-Aug-15 05:09   WBC (CBC) 9.1  RBC (CBC)  3.69  Hemoglobin (CBC) 12.2  Hematocrit (CBC) 36.5  Platelet Count (CBC) 235  MCV 99  MCH 33.0  MCHC 33.3  RDW 13.4  Neutrophil % 73.6  Lymphocyte % 16.8  Monocyte % 8.0  Eosinophil % 0.8  Basophil % 0.8  Neutrophil #  6.7  Lymphocyte # 1.5  Monocyte # 0.7  Eosinophil # 0.1  Basophil # 0.1 (Result(s) reported on 08 Feb 2014 at 05:41AM.)  12-Aug-15 04:57   WBC (CBC) 7.2  RBC (CBC)  3.64  Hemoglobin (CBC)  11.7  Hematocrit (CBC) 36.1  Platelet Count (CBC) 276  MCV 99  MCH 32.3  MCHC 32.5  RDW 13.8  Neutrophil % 58.2  Lymphocyte % 30.3  Monocyte % 8.6  Eosinophil % 2.1  Basophil % 0.8  Neutrophil # 4.2  Lymphocyte # 2.2  Monocyte # 0.6  Eosinophil # 0.2  Basophil # 0.1 (Result(s) reported on 10 Feb 2014 at 05:19AM.)   Radiology Results: XRay:    07-Aug-15 14:09, Chest Portable Single View  Chest Portable Single View   REASON FOR EXAM:    sob  COMMENTS:       PROCEDURE: DXR - DXR PORTABLE CHEST SINGLE VIEW  - Feb 05 2014  2:09PM     CLINICAL DATA:  Shortness of breath, low O2 sats.    EXAM:  PORTABLE CHEST - 1 VIEW    COMPARISON:  11/02/2013    FINDINGS:  Cardiomegaly. Bilateral perihilar and lower lobe opacities most  compatible with edema/ CHF. Layering bilateral effusions. No acute  bony abnormality.     IMPRESSION:  Moderate CHF.  Bilateral effusions.      Electronically Signed    By: Rolm Baptise  M.D.    On: 02/05/2014 14:11         Verified By: Raelyn Number, M.D.,  Cardiology:    07-Aug-15 13:33, ED ECG  Ventricular Rate 125  Atrial Rate 97  QRS Duration 134  QT 358  QTc 516  R Axis -22  T Axis 139  ECG interpretation   Atrial fibrillation with rapid ventricular response  Left bundle branch block  Abnormal ECG  When compared with ECG of 02-Nov-2013 11:14,  Atrial fibrillation has replaced Sinus rhythm  Vent. rate has increased BY  44 BPM  Left bundle branch block has replaced Non-specific intra-ventricular conduction block  Minimal criteria for Anterior infarct are no longer Present  Criteria for Inferior infarct are no longer Present  ----------unconfirmed----------  Confirmed by OVERREAD, NOT ([id number]), editor PEARSON, BARBARA ([id number]) on 02/09/2014 9:39:21 AM  ED ECG     08-Aug-15 09:00, Echo Doppler  Echo Doppler   REASON FOR EXAM:      COMMENTS:       PROCEDURE: [place] - ECHO DOPPLER COMPLETE(TRANSTHOR)  - Feb 06 2014  9:00AM     RESULT: Echocardiogram Report    Patient Name:   Amanda Soto Date of Exam: 02/06/2014  Medical Rec #:  [id number]        Custom1:  Date of Birth:  1925-02-08     Height:       59.0 in  Patient Age:    81 years      Weight:       124.0 lb  Patient Gender: F             BSA:          1.50 m??    Indications: Atrial Fib  Sonographer:    Arville Go RDCS  Referring Phys: Frazier Richards, W    Summary:   1. Left ventricular ejection fraction, by visual estimation, is 35 to   40%.   2. Mildly increased left ventricular septal thickness.   3. Decreased left ventricular internal cavity size.   4. Severely dilated left atrium.   5. Mild mitral valve regurgitation.   6. Mild to moderate aortic valve sclerosis/calcification without any   evidence of aortic stenosis.   7. Mildly elevated pulmonary artery systolic pressure.   8. Mild to moderate tricuspid regurgitation.   9. Moderately increased left ventricular posterior wall  thickness.  10. Poor acoustic windows limit study Difficult to fully evaluate     regional wall motion as endocardium is hard to see.  2D AND M-MODE MEASUREMENTS (normal ranges within parentheses):  Left Ventricle:  Normal  IVSd (2D):      1.44 cm (0.7-1.1)  LVPWd (2D):     1.33 cm (0.7-1.1) Aorta/LA:                  Normal  LVIDd (2D):     2.86 cm (3.4-5.7) Aortic Root (2D): 2.50 cm (2.4-3.7)  LVIDs (2D):     2.16 cm           Left Atrium (2D): 5.30 cm (1.9-4.0)  LV FS (2D):     24.5 %   (>25%)  LV EF (2D):     50.3 %   (>50%)                                    Right Ventricle:                                    RVd (2D):  LV DIASTOLIC FUNCTION:  MV Peak E: 1.27 m/s  SPECTRAL DOPPLER ANALYSIS (where applicable):  Aortic Valve: AoV Max Vel: 1.52 m/s AoV Peak PG: 9.2 mmHg AoV Mean PG:  LVOT Vmax: 0.67 m/s LVOT VTI:  LVOT Diameter: 1.90 cm  AoV Area, Vmax: 1.25 cm?? AoV Area, VTI:  AoV Area, Vmn:  Tricuspid Valve and PA/RV Systolic Pressure: TR Max Velocity: 2.92 m/s RA   Pressure: 10 mmHg RVSP/PASP: 44.2 mmHg  Pulmonic Valve:  PV Max Velocity: 0.94 m/s PV Max PG: 3.5 mmHg PV Mean PG:    PHYSICIAN INTERPRETATION:  Left Ventricle: The left ventricular internal cavity size was decreased.   LV septal wall thickness was mildly increased. LV posterior wall   thickness was moderately increased. Left ventricular ejection fraction,   by visual estimation, is 35 to 40%.  Right Ventricle: The right ventricular size is normal. Global RV systolic   function is normal.  Left Atrium: The left atrium is severely dilated.  Right Atrium: The right atrium is normal in size.  Pericardium: There is no evidence of pericardial effusion.  Mitral Valve: Mild mitral valve regurgitation is seen.  Tricuspid Valve: The tricuspid valve is normal. Mild to moderate   tricuspid regurgitation is visualized. The tricuspid regurgitant velocity   is 2.92 m/s, and with an assumed right atrial pressure of 10  mmHg, the   estimated right ventricular systolic pressureis mildly elevated at 44.2   mmHg.  Aortic Valve: Mild to moderate aortic valve sclerosis/calcification is   present, without any evidence of aortic stenosis.  Pulmonic Valve: The pulmonic valve is not well seen. No indication of   pulmonic valve regurgitation.  Venous: The inferior vena cava was dilated with respiratory size   variation less than 50%.  [id number] Dorris Carnes MD  Electronically signed by [id number] Dorris Carnes MD  Signature Date/Time: 02/06/2014/1:50:53 PM    *** Final ***    IMPRESSION: .        Verified By: Fay Records, M.D., MD   Assessment/Plan:  Assessment/Plan:  Assessment IMP AFIB SOB Weakness Abn Ekg DJD CHF CM   Plan PLAN  continue amiodarone for rapid atrial fibrillation  Lasix for diuresis  ACE-inhibitor for cardiomyopathy when her blood pressure  is able to tolerate  pain management for DJD  physical therapy for ambulation  hypotension improved  followup Cardiology in 1-2 weeks Do not recommend cardiac catheterization at this point  Electronic Signatures: Lujean Amel D (MD)  (Signed 12-Aug-15 20:18)  Authored: Chief Complaint, VITAL SIGNS/ANCILLARY NOTES, Brief Assessment, Lab Results, Radiology Results, Assessment/Plan   Last Updated: 12-Aug-15 20:18 by Lujean Amel D (MD)

## 2014-10-23 NOTE — Consult Note (Signed)
Chief Complaint:  Subjective/Chief Complaint Patient feeling much better no real palpitations today still has chronic back pain shortness of breath improved   VITAL SIGNS/ANCILLARY NOTES: **Vital Signs.:   11-Aug-15 12:00  Vital Signs Type Routine  Temperature Temperature (F) 98  Celsius 36.6  Temperature Source oral  Pulse Pulse 127  Respirations Respirations 23  Systolic BP Systolic BP 250  Diastolic BP (mmHg) Diastolic BP (mmHg) 71  Mean BP 84  Pulse Ox % Pulse Ox % 91  Pulse Ox Activity Level  At rest  Oxygen Delivery Room Air/ 21 %  *Intake and Output.:   Daily 11-Aug-15 07:00  Grand Totals Intake:  240 Output:  1750    Net:  -1510 36 Hr.:  -1510  Oral Intake      In:  240  Urine ml     Out:  1750  Length of Stay Totals Intake:  840 Output:  5690    Net:  -4850   Brief Assessment:  GEN well developed, well nourished, no acute distress   Cardiac Regular  murmur present  -- LE edema  -- JVD   Respiratory normal resp effort  clear BS   Gastrointestinal Normal   Gastrointestinal details normal Soft  Nontender   EXTR negative cyanosis/clubbing, negative edema   Lab Results: Routine Chem:  10-Aug-15 05:09   Glucose, Serum  101  BUN 14  Creatinine (comp)  0.57  Sodium, Serum 137  Potassium, Serum 4.0  Chloride, Serum 99  CO2, Serum 31  Calcium (Total), Serum  8.3  Anion Gap 7  Osmolality (calc) 274  eGFR (African American) >60  eGFR (Non-African American) >60 (eGFR values <61m/min/1.73 m2 may be an indication of chronic kidney disease (CKD). Calculated eGFR is useful in patients with stable renal function. The eGFR calculation will not be reliable in acutely ill patients when serum creatinine is changing rapidly. It is not useful in  patients on dialysis. The eGFR calculation may not be applicable to patients at the low and high extremes of body sizes, pregnant women, and vegetarians.)  11-Aug-15 03:56   Glucose, Serum 95  BUN  19  Creatinine  (comp) 0.62  Sodium, Serum 141  Potassium, Serum 3.5  Chloride, Serum 99  CO2, Serum  34  Calcium (Total), Serum  8.4  Anion Gap 8  Osmolality (calc) 283  eGFR (African American) >60  eGFR (Non-African American) >60 (eGFR values <657mmin/1.73 m2 may be an indication of chronic kidney disease (CKD). Calculated eGFR is useful in patients with stable renal function. The eGFR calculation will not be reliable in acutely ill patients when serum creatinine is changing rapidly. It is not useful in  patients on dialysis. The eGFR calculation may not be applicable to patients at the low and high extremes of body sizes, pregnant women, and vegetarians.)  Routine Hem:  10-Aug-15 05:09   WBC (CBC) 9.1  RBC (CBC)  3.69  Hemoglobin (CBC) 12.2  Hematocrit (CBC) 36.5  Platelet Count (CBC) 235  MCV 99  MCH 33.0  MCHC 33.3  RDW 13.4  Neutrophil % 73.6  Lymphocyte % 16.8  Monocyte % 8.0  Eosinophil % 0.8  Basophil % 0.8  Neutrophil #  6.7  Lymphocyte # 1.5  Monocyte # 0.7  Eosinophil # 0.1  Basophil # 0.1 (Result(s) reported on 08 Feb 2014 at 05:41AM.)  11-Aug-15 03:56   WBC (CBC) 7.5  RBC (CBC)  3.63  Hemoglobin (CBC)  11.7  Hematocrit (CBC) 36.2  Platelet Count (CBC) 248  MCV 100  MCH 32.3  MCHC 32.4  RDW 13.7  Neutrophil % 60.3  Lymphocyte % 28.3  Monocyte % 8.8  Eosinophil % 2.0  Basophil % 0.6  Neutrophil # 4.5  Lymphocyte # 2.1  Monocyte # 0.7  Eosinophil # 0.1  Basophil # 0.0 (Result(s) reported on 09 Feb 2014 at 05:26AM.)   Radiology Results: XRay:    07-Aug-15 14:09, Chest Portable Single View  Chest Portable Single View   REASON FOR EXAM:    sob  COMMENTS:       PROCEDURE: DXR - DXR PORTABLE CHEST SINGLE VIEW  - Feb 05 2014  2:09PM     CLINICAL DATA:  Shortness of breath, low O2 sats.    EXAM:  PORTABLE CHEST - 1 VIEW    COMPARISON:  11/02/2013    FINDINGS:  Cardiomegaly. Bilateral perihilar and lower lobe opacities most  compatible with edema/ CHF.  Layering bilateral effusions. No acute  bony abnormality.     IMPRESSION:  Moderate CHF.  Bilateral effusions.      Electronically Signed    By: Rolm Baptise M.D.    On: 02/05/2014 14:11         Verified By: Raelyn Number, M.D.,  Cardiology:    07-Aug-15 13:33, ED ECG  Ventricular Rate 125  Atrial Rate 97  QRS Duration 134  QT 358  QTc 516  R Axis -22  T Axis 139  ECG interpretation   Atrial fibrillation with rapid ventricular response  Left bundle branch block  Abnormal ECG  When compared with ECG of 02-Nov-2013 11:14,  Atrial fibrillation has replaced Sinus rhythm  Vent. rate has increased BY  44 BPM  Left bundle branch block has replaced Non-specific intra-ventricular conduction block  Minimal criteria for Anterior infarct are no longer Present  Criteria for Inferior infarct are no longer Present  ----------unconfirmed----------  Confirmed by OVERREAD, NOT ([id number]), editor PEARSON, BARBARA ([id number]) on 02/09/2014 9:39:21 AM  ED ECG     08-Aug-15 09:00, Echo Doppler  Echo Doppler   REASON FOR EXAM:      COMMENTS:       PROCEDURE: [place] - ECHO DOPPLER COMPLETE(TRANSTHOR)  - Feb 06 2014  9:00AM     RESULT: Echocardiogram Report    Patient Name:   Amanda Soto Date of Exam: 02/06/2014  Medical Rec #:  [id number]        Custom1:  Date of Birth:  [birth year]     Height:       59.0 in  Patient Age:    81 years      Weight:       124.0 lb  Patient Gender: F             BSA:          1.50 m??    Indications: Atrial Fib  Sonographer:    Arville Go RDCS  Referring Phys: Frazier Richards, W    Summary:   1. Left ventricular ejection fraction, by visual estimation, is 35 to   40%.   2. Mildly increased left ventricular septal thickness.   3. Decreased left ventricular internal cavity size.   4. Severely dilated left atrium.   5. Mild mitral valve regurgitation.   6. Mild to moderate aortic valve sclerosis/calcification without any   evidence of aortic stenosis.   7. Mildly  elevated pulmonary artery systolic pressure.   8. Mild to moderate tricuspid regurgitation.   9. Moderately increased left ventricular  posterior wall thickness.  10. Poor acoustic windows limit study Difficult to fully evaluate     regional wall motion as endocardium is hard to see.  2D AND M-MODE MEASUREMENTS (normal ranges within parentheses):  Left Ventricle:          Normal  IVSd (2D):      1.44 cm (0.7-1.1)  LVPWd (2D):     1.33 cm (0.7-1.1) Aorta/LA:                  Normal  LVIDd (2D):     2.86 cm (3.4-5.7) Aortic Root (2D): 2.50 cm (2.4-3.7)  LVIDs (2D):     2.16 cm           Left Atrium (2D): 5.30 cm (1.9-4.0)  LV FS (2D):     24.5 %   (>25%)  LV EF (2D):     50.3 %   (>50%)                                    Right Ventricle:                                    RVd (2D):  LV DIASTOLIC FUNCTION:  MV Peak E: 1.27 m/s  SPECTRAL DOPPLER ANALYSIS (where applicable):  Aortic Valve: AoV Max Vel: 1.52 m/s AoV Peak PG: 9.2 mmHg AoV Mean PG:  LVOT Vmax: 0.67 m/s LVOT VTI:  LVOT Diameter: 1.90 cm  AoV Area, Vmax: 1.25 cm?? AoV Area, VTI:  AoV Area, Vmn:  Tricuspid Valve and PA/RV Systolic Pressure: TR Max Velocity: 2.92 m/s RA   Pressure: 10 mmHg RVSP/PASP: 44.2 mmHg  Pulmonic Valve:  PV Max Velocity: 0.94 m/s PV Max PG: 3.5 mmHg PV Mean PG:    PHYSICIAN INTERPRETATION:  Left Ventricle: The left ventricular internal cavity size was decreased.   LV septal wall thickness was mildly increased. LV posterior wall   thickness was moderately increased. Left ventricular ejection fraction,   by visual estimation, is 35 to 40%.  Right Ventricle: The right ventricular size is normal. Global RV systolic   function is normal.  Left Atrium: The left atrium is severely dilated.  Right Atrium: The right atrium is normal in size.  Pericardium: There is no evidence of pericardial effusion.  Mitral Valve: Mild mitral valve regurgitation is seen.  Tricuspid Valve: The tricuspid valve is normal. Mild  to moderate   tricuspid regurgitation is visualized. The tricuspid regurgitant velocity   is 2.92 m/s, and with an assumed right atrial pressure of 10 mmHg, the   estimated right ventricular systolic pressureis mildly elevated at 44.2   mmHg.  Aortic Valve: Mild to moderate aortic valve sclerosis/calcification is   present, without any evidence of aortic stenosis.  Pulmonic Valve: The pulmonic valve is not well seen. No indication of   pulmonic valve regurgitation.  Venous: The inferior vena cava was dilated with respiratory size   variation less than 50%.  [id number] Dorris Carnes MD  Electronically signed by [id number] Dorris Carnes MD  Signature Date/Time: 02/06/2014/1:50:53 PM    *** Final ***    IMPRESSION: .        Verified By: Fay Records, M.D., MD   Assessment/Plan:  Assessment/Plan:  Assessment IMP  atrial fibrillation  Congestive Heart Failure  shortness of breath  weakness  DJD  chronic  pain  cardiomyopathy .   Plan PLAN  recommend long-term anticoagulation for AFib  continue low-dose amiodarone for rhythm control  continue rate control  recommend physical therapy  continue pain medications for chronic back pain  would recommend medical therapy for cardiomyopathy  have the patient follow up with Cardiology 1-2 weeks   Electronic Signatures: Lujean Amel D (MD)  (Signed 30-Aug-15 22:45)  Authored: Chief Complaint, VITAL SIGNS/ANCILLARY NOTES, Brief Assessment, Lab Results, Radiology Results, Assessment/Plan   Last Updated: 30-Aug-15 22:45 by Lujean Amel D (MD)

## 2014-10-23 NOTE — Consult Note (Signed)
Brief Consult Note: Diagnosis: AFIB/CHF.   Patient was seen by consultant.   Consult note dictated.   Recommend further assessment or treatment.   Orders entered.   Discussed with Attending MD.   Comments: IMP AFIB LBBB CHF Resp Failure Chronic leg wound Hypothyroid HTN . PLAN Laisx IV for CHF Agree with ECHO Rate control with b-blockers Consider long term anticoug with CHADS score Continue Bp control Lipid therapy with lipitor Increase activity.  Electronic Signatures: Lujean Amel D (MD)  (Signed 10-Aug-15 07:35)  Authored: Brief Consult Note   Last Updated: 10-Aug-15 07:35 by Yolonda Kida (MD)

## 2014-10-23 NOTE — Consult Note (Signed)
PATIENT NAME:  Amanda Soto, Amanda Soto MR#:  [id number] DATE OF BIRTH:  December 11, 1924  DATE OF CONSULTATION:  02/07/2014  REFERRING PHYSICIAN:  Frazier Richards, MD CONSULTING PHYSICIAN:  Dwayne D. Callwood, MD  INDICATION: Atrial fibrillation and congestive heart failure with shortness of breath.  HISTORY OF PRESENT ILLNESS: Amanda Soto is an 81 year old white female with past history of hypothyroidism, chronic leg wound, hypertension and hyperlipidemia who presents with several days of nausea, vomiting and decreased p.o. intake. She had some profound weakness. When she woke up she felt significantly dyspneic with any activity or exertion. Denied any chest pain. The patient came into the Emergency Room with her family with severe shortness of breath and dyspnea and was found to be in rapid atrial fibrillation so was advised to be admitted for further evaluation and care.   PAST MEDICAL HISTORY: Hypothyroidism, hypertension, chronic back pain, right leg pain.   PAST SURGICAL HISTORY: Biopsy of her right leg wound, hysterectomy, appendectomy, tonsillectomy.   HOME MEDICATIONS: Vitamin D, tramadol 50 mg 3 tablets 3 times a day, Tylenol p.r.n., Synthroid 50 mcg once a day, Optivite 1 tablet daily, metoprolol 50 mg twice a day, clindamycin 150 mg 3 times a day, atorvastatin 10 mg a day, aspirin 81 mg a day.   ALLERGIES: None.   FAMILY HISTORY: Noncontributory.   SOCIAL HISTORY: Widowed. No smoking. No alcohol consumption. Lives alone.   REVIEW OF SYSTEMS: No blackout spells. No syncope. She has had some nausea and vomiting. No diarrhea. Denies fever, chills and sweats. No weight loss. No weight gain. No hemoptysis or hematemesis. Denies bright red blood per rectum. No vision change or hearing change. Denies sputum production and cough. She has had a leg wound that has been nonhealing for a few weeks.   PHYSICAL EXAMINATION: VITAL SIGNS: Blood pressure was 130/70, pulse at this time was 90 and regular,  respiratory rate 14, afebrile.  HEENT: Normocephalic, atraumatic. Pupils equal and reactive to light.  NECK: Supple. No significant JVD, bruits or adenopathy.  LUNGS: Bilateral rales in the bases. Adequate air movement. No wheezing.  HEART: Slightly irregular. Systolic ejection murmur at the apex. PMI nondisplaced.  ABDOMEN: Benign. Positive bowel sounds. No rebound, guarding or tenderness.  EXTREMITIES: Within normal limits. NEUROLOGIC: Intact. SKIN: Normal.  DIAGNOSTIC DATA: Troponin less than 0.02. Sodium 133, potassium 4.7, chloride 100, bicarbonate 22, BUN 24, creatinine 1.0, glucose 241. White count 10.9. TSH 3.5. Hemoglobin 13, platelet count 242,000, MCV 102. Urinalysis was negative.   Chest x-ray: Cardiomegaly, bilateral pulmonary infiltrates suggestive of pulmonary edema.   EKG: Atrial fibrillation, left bundle branch block, rapid ventricular response.   ASSESSMENT: 1.  Rapid atrial fibrillation. 2.  Congestive heart failure.  3.  Respiratory failure. 4.  Bundle branch block. 5.  Leg wound. 6.  Hypothyroidism. 7.  History of hypertension. 8.  Chronic back pain.   PLAN: Agree with admit. Place on telemetry. Rule out for myocardial infarction. Follow up cardiac enzymes. Follow up EKG. I agree with echocardiogram for assessment of left ventricular function and valvular structures and wall motion. Followup chest x-ray. Recommend continue Lasix therapy, IV, for diuresis. Consider adding ACE inhibitor therapy. For the heart failure, continue beta blockade therapy. Would consider long-term anticoagulation because of significant CHADS score. Continue thyroid medications. Continue Lipitor for hyperlipidemia. Do not recommend cardiac catheterization at this stage, but may consider functional study in light of possible coronary artery disease. The patient has had no anginal symptoms but the shortness of breath could be an  anginal equivalent with heart failure. Continue antibiotic therapy for  wound. Consider surgical consult for wound care. Recommend physical therapy for increase in activity. Will recommend conservative therapy for atrial fibrillation, anticoagulation. No clear indication for cardioversion at this point and do not recommend cardiac catheterization at least at this stage. Continue to follow. ____________________________ Amanda Senters Clayborn Bigness, MD ddc:sb D: 02/08/2014 09:00:17 ET T: 02/08/2014 09:44:21 ET JOB#: [id number]  cc: Dwayne D. Clayborn Bigness, MD, <Dictator> Yolonda Kida MD ELECTRONICALLY SIGNED 03/04/2014 12:50

## 2014-11-09 DIAGNOSIS — I482 Chronic atrial fibrillation, unspecified: Secondary | ICD-10-CM

## 2014-11-09 DIAGNOSIS — R001 Bradycardia, unspecified: Secondary | ICD-10-CM

## 2014-11-09 DIAGNOSIS — I10 Essential (primary) hypertension: Secondary | ICD-10-CM

## 2014-11-09 DIAGNOSIS — I4891 Unspecified atrial fibrillation: Secondary | ICD-10-CM | POA: Insufficient documentation

## 2014-11-09 DIAGNOSIS — I5022 Chronic systolic (congestive) heart failure: Secondary | ICD-10-CM | POA: Insufficient documentation

## 2014-12-02 ENCOUNTER — Other Ambulatory Visit: Payer: Self-pay | Admitting: Orthopedic Surgery

## 2014-12-02 DIAGNOSIS — M545 Low back pain, unspecified: Secondary | ICD-10-CM

## 2014-12-09 ENCOUNTER — Ambulatory Visit
Admission: RE | Admit: 2014-12-09 | Discharge: 2014-12-09 | Disposition: A | Discharge status: Home / Self Care | Payer: Medicare Other | Source: Ambulatory Visit | Attending: Orthopedic Surgery | Admitting: Orthopedic Surgery

## 2014-12-09 ENCOUNTER — Encounter
Admission: RE | Admit: 2014-12-09 | Discharge: 2014-12-09 | Disposition: A | Discharge status: Home / Self Care | Payer: Medicare Other | Source: Ambulatory Visit | Attending: Orthopedic Surgery | Admitting: Orthopedic Surgery

## 2014-12-09 DIAGNOSIS — M545 Low back pain, unspecified: Secondary | ICD-10-CM

## 2014-12-09 MED ORDER — TECHNETIUM TC 99M MEDRONATE IV KIT
25.0000 | PACK | Freq: Once | INTRAVENOUS | Status: AC | PRN
  Administered 2014-12-09: 22.41 via INTRAVENOUS

## 2015-05-23 DIAGNOSIS — Z0000 Encounter for general adult medical examination without abnormal findings: Secondary | ICD-10-CM | POA: Insufficient documentation

## 2015-05-23 DIAGNOSIS — Z79899 Other long term (current) drug therapy: Secondary | ICD-10-CM | POA: Insufficient documentation

## 2015-07-06 ENCOUNTER — Ambulatory Visit: Payer: Medicare Other | Admitting: Family Medicine

## 2015-07-25 ENCOUNTER — Encounter: Payer: Self-pay | Admitting: Family Medicine

## 2015-07-25 ENCOUNTER — Ambulatory Visit (INDEPENDENT_AMBULATORY_CARE_PROVIDER_SITE_OTHER): Payer: Medicare Other | Admitting: Family Medicine

## 2015-07-25 VITALS — BP 110/68 | HR 93 | Temp 97.9°F | Ht <= 58 in | Wt 132.0 lb

## 2015-07-25 DIAGNOSIS — R109 Unspecified abdominal pain: Secondary | ICD-10-CM

## 2015-07-25 DIAGNOSIS — M545 Low back pain, unspecified: Secondary | ICD-10-CM

## 2015-07-25 DIAGNOSIS — I482 Chronic atrial fibrillation, unspecified: Secondary | ICD-10-CM

## 2015-07-25 DIAGNOSIS — G8929 Other chronic pain: Secondary | ICD-10-CM

## 2015-07-25 MED ORDER — TRAMADOL HCL 50 MG PO TABS: 100.0000 mg | ORAL_TABLET | Freq: Two times a day (BID) | ORAL | Status: DC | PRN

## 2015-07-25 NOTE — Patient Instructions (Signed)
Nice to meet you. We will order an MRI of her back to evaluate her back pain further. Please monitor her abdominal discomfort and follow-up with GI. You can stop the amitriptyline as we discussed as it can interact with the tramadol. If you develop numbness, weakness, bladder incontinence, numbness between her legs, fever, bleeding, or any new or change in symptoms please seek medical attention.

## 2015-07-25 NOTE — Progress Notes (Signed)
Pre visit review using our clinic review tool, if applicable. No additional management support is needed unless otherwise documented below in the visit note. 

## 2015-07-27 ENCOUNTER — Encounter: Payer: Self-pay | Admitting: Family Medicine

## 2015-07-27 DIAGNOSIS — R109 Unspecified abdominal pain: Secondary | ICD-10-CM | POA: Insufficient documentation

## 2015-07-27 NOTE — Assessment & Plan Note (Signed)
Patient with chronic right low back pain. Possible that the discomfort she has in her right abdomen is radiating pain from her back. Discussed this at length with patient. Also discussed that her leakage of stool could be related to her back pain. Given that this is not an acute change there is no need for emergent imaging. She otherwise has no red flags and is neurologically intact. Discussed the options for workup and treatment which included referral back to the orthopedic surgery, referral to  neurosurgery, or completing lumbar spine imaging with MRI. Patient opted for MRI and then determine where to refer. We will refill her tramadol today. Discussed possibility of serotonin syndrome with tramadol and amitriptyline and patient opted to discontinue the amitriptyline. She's given return precautions.

## 2015-07-27 NOTE — Assessment & Plan Note (Signed)
Chronic right-sided abdominal discomfort. She has a benign abdominal exam today. She's being followed by GI for this. She had negative FOBT in the office today. This could be referred pain from her back. We will workup her back pain. If that is negative would consider further workup of her abdomen. She'll continue to follow-up with GI. She's given return precautions.

## 2015-07-27 NOTE — Progress Notes (Signed)
Patient ID: Amanda Soto, female   DOB: 05-20-1925, 82 y.o.   MRN: [id number]  [name], MD Phone: [phone]  Amanda Soto is a 82 y.o. female who presents today for new patient visit.  Patient notes for 3-4 years she has been having an intermittent discomfort in her right abdomen. She notes it is a soreness. It runs down the side of her abdomen to her groin. She notes she has seen GI for this. She was given amitriptyline as it was felt as though this was related to her back pain. She's never had imaging of her abdomen. She does note she gets loose stools relatively frequently. When she gets these she goes 2-3 times a day. She notes when she has loose stools she leaks stool on herself as well. She states she has had black stool past, and was told that she had blood in her stool previously. She's being followed by GI for this. Denies nausea and vomiting.  Back pain: Patient notes right-sided back pain for about 10 years. No prior injury. She's been followed by orthopedics for this. She's gotten injections and epidurals. She is seeing chiropractor and acupuncturist. Notes MRI of her back has not shown anything in the past. The pain does not radiate. There is no numbness or weakness. She denies urinary incontinence. No saddle anesthesia. No fevers. No history of cancer. She does note some leakage of stool for the last 3-4 years intermittently. She takes tramadol 100 mg 1-2 times a day.  A. fib: Patient notes she is on Eliquis for this. She's also on amiodarone. Denies recent palpitations. No chest pain or shortness of breath. No lightheadedness. Does note black stool though prior hemoglobins have been stable. No other bleeding.  Active Ambulatory Problems    Diagnosis Date Noted  . Chronic systolic heart failure ([place]) 11/09/2014  . Bradycardia 11/09/2014  . Atrial fibrillation ([place]) 11/09/2014  . HTN (hypertension) 11/09/2014  . Back pain 07/27/2015  . Abdominal discomfort 07/27/2015    Resolved Ambulatory Problems    Diagnosis Date Noted  . No Resolved Ambulatory Problems   Past Medical History  Diagnosis Date  . CHF (congestive heart failure) ([place])   . Hypertension   . Hyperlipidemia   . Hypothyroid   . Arthritis   . Depression   . Skin cancer   . Chickenpox   . Diverticulitis   . GERD (gastroesophageal reflux disease)   . Heart murmur     Family History  Problem Relation Age of Onset  . Stroke Maternal Grandmother   . Breast cancer Mother   . Breast cancer Sister   . Heart disease      Parent, grandparent, sons  . Hypertension      Parent, grandparent    Social History   Social History  . Marital Status: Married    Spouse Name: N/A  . Number of Children: N/A  . Years of Education: N/A   Occupational History  . Not on file.   Social History Main Topics  . Smoking status: Never Smoker   . Smokeless tobacco: Never Used  . Alcohol Use: 0.0 oz/week    0 Standard drinks or equivalent per week     Comment: occasional alcohol  . Drug Use: No  . Sexual Activity: Not on file   Other Topics Concern  . Not on file   Social History Narrative    ROS   General:  Negative for nexplained weight loss, fever Skin: Negative for new or  changing mole, sore that won't heal HEENT: Positive for trouble seeing, Negative for trouble hearing, ringing in ears, mouth sores, hoarseness, change in voice, dysphagia. CV:  Has a for palpitations, Negative for chest pain, dyspnea, edema Resp: Negative for cough, dyspnea, hemoptysis GI: Positive for diarrhea, abdominal pain, melena, Negative for nausea, vomiting, constipation, hematochezia GU: Negative for dysuria, incontinence, urinary hesitance, hematuria, vaginal or penile discharge, polyuria, sexual difficulty, lumps in testicle or breasts MSK: Positive for back pain, Negative for muscle cramps or aches, joint pain or swelling Neuro: Negative for headaches, weakness, numbness, dizziness, passing  out/fainting Psych: Negative for depression, anxiety, memory problems  Objective  Physical Exam Filed Vitals:   07/25/15 1322  BP: 110/68  Pulse: 93  Temp: 97.9 F (36.6 C)    BP Readings from Last 3 Encounters:  07/25/15 110/68  04/02/14 135/58   Wt Readings from Last 3 Encounters:  07/25/15 132 lb (59.875 kg)  04/02/14 119 lb (53.978 kg)    Physical Exam  Constitutional: She is well-developed, well-nourished, and in no distress.  HENT:  Head: Normocephalic and atraumatic.  Right Ear: External ear normal.  Left Ear: External ear normal.  Mouth/Throat: Oropharynx is clear and moist.  Eyes: Conjunctivae are normal. Pupils are equal, round, and reactive to light.  Neck: Neck supple.  Cardiovascular: Normal rate, regular rhythm and normal heart sounds.  Exam reveals no gallop and no friction rub.   No murmur heard. Pulmonary/Chest: Effort normal and breath sounds normal. No respiratory distress. She has no wheezes. She has no rales.  Abdominal: Soft. Bowel sounds are normal. She exhibits no distension. There is no tenderness. There is no rebound and no guarding.  Genitourinary: Guaiac negative stool.  Decreased rectal tone, brown stool noted, no blood, intact perineal sensation  Musculoskeletal:  No midline spine tenderness, no midline spine step-off, mild right low back muscular tenderness, no swelling or erythema of the back  Lymphadenopathy:    She has no cervical adenopathy.  Neurological: She is alert. Gait normal.  5 out of 5 strength in bilateral quads, hamstrings, plantar flexion, and dorsiflexion, sensation to light touch intact in bilateral lower extremities, patellar reflexes absent  Skin: Skin is warm and dry. She is not diaphoretic.  Psychiatric: Mood and affect normal.     Assessment/Plan:   Atrial fibrillation Rate controlled at this time. Stool is negative for blood. Doubt significant bleeding if she has bleeding. Hemoglobin has been stable on last  check. We'll continue current medicines and follow up with cardiology.  Back pain Patient with chronic right low back pain. Possible that the discomfort she has in her right abdomen is radiating pain from her back. Discussed this at length with patient. Also discussed that her leakage of stool could be related to her back pain. Given that this is not an acute change there is no need for emergent imaging. She otherwise has no red flags and is neurologically intact. Discussed the options for workup and treatment which included referral back to the orthopedic surgery, referral to  neurosurgery, or completing lumbar spine imaging with MRI. Patient opted for MRI and then determine where to refer. We will refill her tramadol today. Discussed possibility of serotonin syndrome with tramadol and amitriptyline and patient opted to discontinue the amitriptyline. She's given return precautions.   Abdominal discomfort Chronic right-sided abdominal discomfort. She has a benign abdominal exam today. She's being followed by GI for this. She had negative FOBT in the office today. This could be referred pain  from her back. We will workup her back pain. If that is negative would consider further workup of her abdomen. She'll continue to follow-up with GI. She's given return precautions.    Orders Placed This Encounter  Procedures  . MR Lumbar Spine Wo Contrast    Standing Status: Future     Number of Occurrences:      Standing Expiration Date: 09/21/2016    Order Specific Question:  Reason for Exam (SYMPTOM  OR DIAGNOSIS REQUIRED)    Answer:  chronic low back pain with chronic intermittent fecal incontinence    Order Specific Question:  Preferred imaging location?    Answer:  [facility]    Order Specific Question:  Does the patient have a pacemaker or implanted devices?    Answer:  No    Order Specific Question:  What is the patient's sedation requirement?    Answer:  No Sedation    Meds ordered this  encounter  Medications  . DISCONTD: amitriptyline (ELAVIL) 25 MG tablet    Sig: Take by mouth.  . traMADol (ULTRAM) 50 MG tablet    Sig: Take 2 tablets (100 mg total) by mouth every 12 (twelve) hours as needed for moderate pain.    Dispense:  100 tablet    Refill:  0     [name]

## 2015-07-27 NOTE — Assessment & Plan Note (Signed)
Rate controlled at this time. Stool is negative for blood. Doubt significant bleeding if she has bleeding. Hemoglobin has been stable on last check. We'll continue current medicines and follow up with cardiology.

## 2015-08-10 ENCOUNTER — Ambulatory Visit: Payer: Medicare Other | Attending: Gastroenterology | Admitting: Physical Therapy

## 2015-08-10 DIAGNOSIS — R279 Unspecified lack of coordination: Secondary | ICD-10-CM | POA: Diagnosis not present

## 2015-08-10 DIAGNOSIS — Z7409 Other reduced mobility: Secondary | ICD-10-CM | POA: Insufficient documentation

## 2015-08-10 DIAGNOSIS — M629 Disorder of muscle, unspecified: Secondary | ICD-10-CM | POA: Insufficient documentation

## 2015-08-10 DIAGNOSIS — M533 Sacrococcygeal disorders, not elsewhere classified: Secondary | ICD-10-CM | POA: Diagnosis not present

## 2015-08-11 ENCOUNTER — Ambulatory Visit: Payer: Medicare Other

## 2015-08-11 NOTE — Therapy (Signed)
[place] MAIN [facility] SERVICES [address] [place], [place], [address] Phone: [phone]   Fax:  [phone]  Physical Therapy Evaluation  Patient Details  Name: Amanda Soto MRN: [id number] Date of Birth: 1924-09-04 Referring Provider: Ronney Asters, PA-C  Encounter Date: 08/10/2015      PT End of Session - 08/10/15 1554    Visit Number 1   Number of Visits 12   Date for PT Re-Evaluation 11/02/15   Authorization Type 1/10 g code    PT Start Time 1410   PT Stop Time 1525   PT Time Calculation (min) 75 min   Activity Tolerance Patient tolerated treatment well;No increased pain   Behavior During Therapy [facility] for tasks assessed/performed      Past Medical History  Diagnosis Date  . CHF (congestive heart failure) ([place])   . Hypertension   . Hyperlipidemia   . Atrial fibrillation ([place])   . Hypothyroid   . Arthritis   . Depression   . Skin cancer   . Chickenpox   . Diverticulitis   . GERD (gastroesophageal reflux disease)   . Heart murmur     Past Surgical History  Procedure Laterality Date  . Cataract extraction    . Partial hysterectomy    . Appendectomy    . Tonsillectomy      There were no vitals filed for this visit.  Visit Diagnosis:  Sacroiliac joint dysfunction  Fascial defect  Mobility impaired  Lack of coordination      Subjective Assessment - 08/11/15 2253    Subjective Pt reports her chronic low back pain (82 yo) and soreness in her abdomen. Both areas of complaints occur on her right side.  CLBP: 10/10 after waking up and completing the following tasks: make coffee and make her bed. Pt reports having difficulty donning her R sock and shoe and walking her ramp.  Pt takes Tramadol but the relief is short lasting with pain decreasing to 3/10.  Pain increases wtih activites when she is on her feet which includes sweeping, vaccuuming, lifting groceries, making bed.  Pt takes a tylenol  by lunch time to manage  pain, and a tynlenol /tramadol by night time. Denied radiating pain. This LBP has increased across the past week. 2) Abdominal  "sore" (4-5 yo) on R side by groin. 4/10 which occurs intermittently without a dependence on positions nor movement.  Pt reports loose stools for the past year and "not knowing when she is going".  Pt wears pads 2-3 days per week. Stool regularity occurs every 2 days with [facility] Stool Type 6.         Patient is accompained by: --  Amanda Soto   Pertinent History Pt has tried PT, steroid shots, and predenosone in the past but they did not help.    Patient Stated Goals go shopping, church by herself            [facility] PT Assessment - 08/11/15 2152    Assessment   Medical Diagnosis abdomnal pain    Referring Provider Ronney Asters, PA-C   Precautions   Precautions None   Restrictions   Weight Bearing Restrictions No   Home Environment   Living Environment Private residence   Additional Comments 5 STE with rail , ramp    Prior Function   Level of Independence Independent   Observation/Other Assessments   Observations pt arrived in [facility],  severe thoracic kyphosis   Skin Integrity  Other Surveys  --  [place] 74% , ODI 30%    Other:   Other/ Comments sweeping w/ forward flexion, difficulty for sidestepping technique 2/2 limited spinal mobility   AROM   Overall AROM Comments hip ER/abd  R limited > R,  spinal flexion 90 deg w/p!, sidebend L 40% w/ p!, rotaion 405 L, 20% on R no p! (post_Tx: forward bending and side bend without p! )    p! w/ hip flexion on R    PROM   Overall PROM Comments hip 90-90 hip IR ~10 deg R, ~15 deg on L,  hip ext on R in sidelying ~15 deg w/ p!, (post-Tx: hip ext w/o p!)     Strength   Overall Strength Comments 4/5 hip flexion L, 3/5 on R w/ p!    Palpation   SI assessment  R PSIS limited mobility, R sacral torsion, p! w/ counternutation     Palpation comment increased tensions / tenderness around R PSIS, R LQ abdominal scar with  restricted mobility    Bed Mobility   Bed Mobility --  half crunch OOB, excessive cuing for log rolling    Ambulation/Gait   Gait Pattern --  decreased stride, narrow BOS    Gait Comments to be assessed at next session                    [facility] Adult PT Treatment/Exercise - 08/11/15 2152    Self-Care   Self-Care --  POC, anatomy/ physiology, goals, HEP   Exercises   Exercises --  see pt instructions   Manual Therapy   Manual therapy comments SIJ mobility sequence to address sacral torsion, increase mobility R PSIS and sacral nutation   scar massage (myofascial) , guided pt on self massage                PT Education - 08/11/15 2229    Education provided Yes   Education Details HEP, POC, anatomy/physiology, goals, education about role on PT and research on MRIs    Person(s) Educated Patient   Methods Explanation;Demonstration;Tactile cues;Verbal cues   Comprehension Returned demonstration;Verbalized understanding             PT Long Term Goals - 08/10/15 1434    PT LONG TERM GOAL #1   Title Pt will demo proper body mechanics with sweeping, vacuuming, getting in / out car in order to perform ADLs.    Time 12   Period Weeks   Status New   PT LONG TERM GOAL #2   Title Pt will increased scar mobility with no report of pain with deep palpation over scar in order to decrease pain and improve QOL.   Time 12   Period Weeks   Status New   PT LONG TERM GOAL #3   Title Pt will decrease her Collins score from 74% to < 50% in order to participate in community events.    Time 12   Period Weeks   Status New   PT LONG TERM GOAL #4   Title Pt will decrease her ODI score from 30% to < 15% in order to return to shopping.   Time 12   Period Weeks   Status New   PT LONG TERM GOAL #5   Title Pt will demo increase hip ext ~15 deg PROM on RLE in order to progress to walking with less pain.   Time 12   Period Weeks   Status New   Additional Long  Term Goals    Additional Long Term Goals Yes   PT LONG TERM GOAL #6   Title Pt will demo no lumbopelvic instability with deep core ex level 1-4 5 reps in order to perform household chores.    Time 12   Period Weeks   Status New   PT LONG TERM GOAL #7   Title Pt will report decreased pad use from 2-3 x/ week to 1-2x/week in order to demo improved pelvic floor function and improved GI function.   Time 12   Period Weeks   Status New               Plan - 08/10/15 1556    Clinical Impression Statement Pt is a 82 yo female who c/o chronic R abdominal and lumbar pain that impact her ability to perform household chores, and participate in community events. Pt's clinical resonation showed decreased scar mobility over R LQ of abdomen with decreased hip,SIJ, spinal mobility, tenderness and increased tensions over SIJ, pelvic malalignment, and poor deep core coordination and strength. Pt's personal factors include loose stools with dependence on pad wear, chronicity of pain with failed outcomes, decreased activity level in the community,  increased side effects with steroid use, and co-morbidities.  With these factors combined, pt's condition is moderate in complexity and is evolving. Post-Tx, pt reported feeling more relaxed than she has been in a long time.Pt demo'd increased PROM hip ext on RLE, ability to perform spinal side flexion and forward flexion with decreased pain.      Pt will benefit from skilled therapeutic intervention in order to improve on the following deficits Abnormal gait;Cardiopulmonary status limiting activity;Decreased activity tolerance;Decreased balance;Difficulty walking;Impaired flexibility;Decreased safety awareness;Decreased range of motion;Decreased endurance;Hypomobility;Decreased strength;Decreased coordination;Decreased mobility;Decreased scar mobility;Increased muscle spasms;Postural dysfunction;Pain;Improper body mechanics;Increased fascial restrictions;Decreased cognition   Rehab  Potential Good   Clinical Impairments Affecting Rehab Potential high co pay    PT Frequency 1x / week   PT Duration 12 weeks   PT Treatment/Interventions ADLs/Self Care Home Management;Aquatic Therapy;Electrical Stimulation;Cryotherapy;Gait training;Moist Heat;Stair training;Functional mobility training;Therapeutic activities;Therapeutic exercise;Balance training;Neuromuscular re-education;Taping;Manual techniques;Patient/family education;Scar mobilization;Energy conservation;Passive range of motion   Consulted and Agree with Plan of Care Patient;Family member/caregiver          G-Codes - Aug 18, 2015 2228/10/08    Functional Assessment Tool Used PDI 74%   Functional Limitation Mobility: Walking and moving around   Mobility: Walking and Moving Around Current Status [phone]) At least 60 percent but less than 80 percent impaired, limited or restricted   Mobility: Walking and Moving Around Goal Status [phone]) At least 40 percent but less than 60 percent impaired, limited or restricted       Problem List Patient Active Problem List   Diagnosis Date Noted  . Back pain 07/27/2015  . Abdominal discomfort 07/27/2015  . Chronic systolic heart failure ([place]) 11/09/2014  . Bradycardia 11/09/2014  . Atrial fibrillation ([place]) 11/09/2014  . HTN (hypertension) 11/09/2014    Jerl Mina ,PT, DPT, E-RYT  2015/08/18, 10:54 PM  [place] MAIN [facility] SERVICES [address] [place], [place], [address] Phone: [phone]   Fax:  [phone]  Name: Amanda Soto MRN: [id number] Date of Birth: August 07, 1924

## 2015-08-11 NOTE — Patient Instructions (Addendum)
Stretch:  Holding ends of sheet that is placed under R thigh, place foot next to L knee. Inhale , do nothing, exhale, pull sheet  and lift foot off bed 1-2" before the point of pain.  10x 3x day    Scar massage: using a drop of coconut oil / olive oil over R abdominal scar, gentle pressure zig zag (perpendicular) 5 min each night

## 2015-08-15 ENCOUNTER — Ambulatory Visit: Payer: Medicare Other | Admitting: Physical Therapy

## 2015-08-15 DIAGNOSIS — R279 Unspecified lack of coordination: Secondary | ICD-10-CM

## 2015-08-15 DIAGNOSIS — M533 Sacrococcygeal disorders, not elsewhere classified: Secondary | ICD-10-CM

## 2015-08-15 DIAGNOSIS — L570 Actinic keratosis: Secondary | ICD-10-CM | POA: Diagnosis not present

## 2015-08-15 DIAGNOSIS — Z7409 Other reduced mobility: Secondary | ICD-10-CM | POA: Diagnosis not present

## 2015-08-15 DIAGNOSIS — M629 Disorder of muscle, unspecified: Secondary | ICD-10-CM | POA: Diagnosis not present

## 2015-08-15 DIAGNOSIS — D0439 Carcinoma in situ of skin of other parts of face: Secondary | ICD-10-CM | POA: Diagnosis not present

## 2015-08-15 NOTE — Therapy (Addendum)
[place] MAIN [facility] SERVICES [address] [place], [place], [address] Phone: [phone]   Fax:  [phone]  Physical Therapy Treatment  Patient Details  Name: Amanda Soto MRN: [id number] Date of Birth: 1925/01/28 Referring Provider: Ronney Asters, PA-C  Encounter Date: 08/15/2015      PT End of Session - 08/15/15 2322    Visit Number 2   Number of Visits 12   Date for PT Re-Evaluation 11/02/15   Authorization Type 2/10 g code    PT Start Time 1100   PT Stop Time 1230   PT Time Calculation (min) 90 min   Equipment Utilized During Treatment Gait belt  SPV   Activity Tolerance Patient tolerated treatment well;No increased pain   Behavior During Therapy [facility] for tasks assessed/performed      Past Medical History  Diagnosis Date  . CHF (congestive heart failure) ([place])   . Hypertension   . Hyperlipidemia   . Atrial fibrillation ([place])   . Hypothyroid   . Arthritis   . Depression   . Skin cancer   . Chickenpox   . Diverticulitis   . GERD (gastroesophageal reflux disease)   . Heart murmur     Past Surgical History  Procedure Laterality Date  . Cataract extraction    . Partial hysterectomy    . Appendectomy    . Tonsillectomy      There were no vitals filed for this visit.  Visit Diagnosis: Sacroiliac joint dysfunction Fascial defect Mobility impaired  Lack of coordination      Subjective Assessment - 08/15/15 2309    Subjective Pt reported she has been performing her HEP but did not have time this morning. Pt still feels pain after performing activties in in thehome.  Pt stated the stretchign exercise caused pain after performing it.    Patient is accompained by: Family member  [employer]   Pertinent History Pt has tried PT, steroid shots, and predenosone in the past but they did not help.    Patient Stated Goals go shopping, church by herself            [facility] PT Assessment - 08/15/15 1121    Observation/Other Assessments   Observations SIJ pain after 5 reps of deep Core 2    Sit to Stand   Comments --   Palpation   Palpation comment Decreased scar restriction   pain over pubic symphsis, along thoracic SP    Bed Mobility   Bed Mobility --  Required excessive tactile. Verbal cuing for log rolling 2 x   Transfers   Five time sit to stand comments  56.55sec    Comments poor eccentric control on descent, no arms on chairs   Ambulation/Gait   Gait velocity --  11.72 sec/ 10 ft (Pre-Tx), 10.26 sec / 10 ft ( post-Tx)    Gait Comments foward head, wide step length                      [facility] Adult PT Treatment/Exercise - 08/15/15 1121    Therapeutic Activites    Therapeutic Activities --  Assessed 5 STS and gait    Neuro Re-ed    Neuro Re-ed Details  Cues for log rolling    Exercises   Exercises --  see pt instructions   Manual Therapy   Manual therapy comments scar massage locally and pubic symphysis   grade II AP mob along thoracic spine in hooklying  PT Education - 08/15/15 2322    Education provided Yes   Education Details HEP   Person(s) Educated Patient   Methods Explanation;Demonstration;Verbal cues;Handout;Tactile cues   Comprehension Verbalized understanding;Returned demonstration             PT Long Term Goals - 08/15/15 2317    PT LONG TERM GOAL #1   Title Pt will demo proper body mechanics with sweeping, vacuuming, getting in / out car in order to perform ADLs.    Time 12   Period Weeks   Status New   PT LONG TERM GOAL #2   Title Pt will increased scar mobility with no report of pain with deep palpation over scar in order to decrease pain and improve QOL.   Time 12   Period Weeks   Status New   PT LONG TERM GOAL #3   Title Pt will decrease her [place] score from 74% to < 50% in order to participate in community events.    Time 12   Period Weeks   Status New   PT LONG TERM GOAL #4   Title Pt will decrease  her ODI score from 30% to < 15% in order to return to shopping.   Time 12   Period Weeks   Status New   PT LONG TERM GOAL #5   Title Pt will demo increase hip ext ~15 deg PROM on RLE in order to progress to walking with less pain.   Time 12   Period Weeks   Status New   Additional Long Term Goals   Additional Long Term Goals Yes   PT LONG TERM GOAL #6   Title Pt will demo no lumbopelvic instability with deep core ex level 1-4 5 reps in order to perform household chores.    Time 12   Period Weeks   Status New   PT LONG TERM GOAL #7   Title Pt will report decreased pad use from 2-3 x/ week to 1-2x/week in order to demo improved pelvic floor function and improved GI function.   Time 12   Period Weeks   Status New   PT LONG TERM GOAL #8   Title Pt will decrease her 5 STS from 56 sec to < 46 sec in order to demo decreased risk of falls and deep core strength, decreased pain.    Time 12   Period Weeks   Status New               Plan - 08/15/15 2323    Clinical Impression Statement Pt tolerated manual Tx over abdominal scar which showed decreased restriction compared to last session. Pt responded to  passive ROM HEP without complaint but had pain at SIJ with deep core 2 which was then withheld along with piriformis stretch (due to pain) .  Pt showed improved gait speed post Tx. Pt continues to require PT to learn log rolling in order to maintain spinal health and decrease abdominal pain.   Pt will benefit from skilled therapeutic intervention in order to improve on the following deficits Abnormal gait;Cardiopulmonary status limiting activity;Decreased activity tolerance;Decreased balance;Difficulty walking;Impaired flexibility;Decreased safety awareness;Decreased range of motion;Decreased endurance;Hypomobility;Decreased strength;Decreased coordination;Decreased mobility;Decreased scar mobility;Increased muscle spasms;Postural dysfunction;Pain;Improper body mechanics;Increased fascial  restricitons;Decreased cognition   Rehab Potential Good   Clinical Impairments Affecting Rehab Potential high co pay    PT Frequency 1x / week   PT Duration 12 weeks   PT Treatment/Interventions ADLs/Self Care Home Management;Aquatic Therapy;Electrical Stimulation;Cryotherapy;Gait training;Moist Heat;Stair training;Functional  mobility training;Therapeutic activities;Therapeutic exercise;Balance training;Neuromuscular re-education;Taping;Manual techniques;Patient/family education;Scar mobilization;Energy conservation;Passive range of motion   Consulted and Agree with Plan of Care Patient;Family member/caregiver        Problem List Patient Active Problem List   Diagnosis Date Noted  . Back pain 07/27/2015  . Abdominal discomfort 07/27/2015  . Chronic systolic heart failure ([place]) 11/09/2014  . Bradycardia 11/09/2014  . Atrial fibrillation ([place]) 11/09/2014  . HTN (hypertension) 11/09/2014    Jerl Mina ,PT, DPT, E-RYT  08/15/2015, 11:29 PM  [place] MAIN [facility] SERVICES [address] [place], [place], [address] Phone: [phone]   Fax:  [phone]  Name: Amanda Soto MRN: [id number] Date of Birth: 04-29-1925

## 2015-08-23 DIAGNOSIS — H353132 Nonexudative age-related macular degeneration, bilateral, intermediate dry stage: Secondary | ICD-10-CM | POA: Diagnosis not present

## 2015-08-25 ENCOUNTER — Encounter: Payer: Self-pay | Admitting: Emergency Medicine

## 2015-08-25 ENCOUNTER — Ambulatory Visit (INDEPENDENT_AMBULATORY_CARE_PROVIDER_SITE_OTHER): Payer: Medicare Other | Admitting: Family Medicine

## 2015-08-25 ENCOUNTER — Encounter: Payer: Self-pay | Admitting: Family Medicine

## 2015-08-25 ENCOUNTER — Emergency Department
Admission: EM | Admit: 2015-08-25 | Discharge: 2015-08-25 | Disposition: A | Discharge status: Home / Self Care | Payer: Medicare Other | Attending: Emergency Medicine | Admitting: Emergency Medicine

## 2015-08-25 ENCOUNTER — Emergency Department: Payer: Medicare Other

## 2015-08-25 VITALS — BP 118/78 | HR 84 | Temp 97.4°F | Ht <= 58 in | Wt 133.0 lb

## 2015-08-25 DIAGNOSIS — I499 Cardiac arrhythmia, unspecified: Secondary | ICD-10-CM | POA: Diagnosis not present

## 2015-08-25 DIAGNOSIS — R0602 Shortness of breath: Secondary | ICD-10-CM | POA: Diagnosis not present

## 2015-08-25 DIAGNOSIS — R05 Cough: Secondary | ICD-10-CM | POA: Insufficient documentation

## 2015-08-25 DIAGNOSIS — R531 Weakness: Secondary | ICD-10-CM | POA: Insufficient documentation

## 2015-08-25 DIAGNOSIS — I10 Essential (primary) hypertension: Secondary | ICD-10-CM | POA: Insufficient documentation

## 2015-08-25 DIAGNOSIS — Z79899 Other long term (current) drug therapy: Secondary | ICD-10-CM | POA: Insufficient documentation

## 2015-08-25 DIAGNOSIS — Z7901 Long term (current) use of anticoagulants: Secondary | ICD-10-CM | POA: Diagnosis not present

## 2015-08-25 LAB — COMPREHENSIVE METABOLIC PANEL
ALT: 14 U/L (ref 14–54)
AST: 25 U/L (ref 15–41)
Albumin: 3.9 g/dL (ref 3.5–5.0)
Alkaline Phosphatase: 55 U/L (ref 38–126)
Anion gap: 9 (ref 5–15)
BILIRUBIN TOTAL: 1 mg/dL (ref 0.3–1.2)
BUN: 15 mg/dL (ref 6–20)
CO2: 26 mmol/L (ref 22–32)
CREATININE: 0.52 mg/dL (ref 0.44–1.00)
Calcium: 9 mg/dL (ref 8.9–10.3)
Chloride: 100 mmol/L — ABNORMAL LOW (ref 101–111)
GFR calc Af Amer: >60 mL/min (ref >60)
Glucose, Bld: 165 mg/dL — ABNORMAL HIGH (ref 65–99)
Potassium: 3.9 mmol/L (ref 3.5–5.1)
Sodium: 135 mmol/L (ref 135–145)
TOTAL PROTEIN: 7.3 g/dL (ref 6.5–8.1)

## 2015-08-25 LAB — CBC WITH DIFFERENTIAL/PLATELET
BASOS ABS: 0 10*3/uL (ref 0–0.1)
Basophils Relative: 1 %
EOS ABS: 0 10*3/uL (ref 0–0.7)
EOS PCT: 0 %
HCT: 34 % — ABNORMAL LOW (ref 35.0–47.0)
Hemoglobin: 11.3 g/dL — ABNORMAL LOW (ref 12.0–16.0)
Lymphocytes Relative: 21 %
Lymphs Abs: 1.6 10*3/uL (ref 1.0–3.6)
MCH: 31.4 pg (ref 26.0–34.0)
MCHC: 33.3 g/dL (ref 32.0–36.0)
MCV: 94.3 fL (ref 80.0–100.0)
Monocytes Absolute: 0.5 10*3/uL (ref 0.2–0.9)
Monocytes Relative: 7 %
Neutro Abs: 5.3 10*3/uL (ref 1.4–6.5)
Neutrophils Relative %: 71 %
PLATELETS: 279 10*3/uL (ref 150–440)
RBC: 3.61 MIL/uL — AB (ref 3.80–5.20)
RDW: 13.8 % (ref 11.5–14.5)
WBC: 7.4 10*3/uL (ref 3.6–11.0)

## 2015-08-25 LAB — TROPONIN I: Troponin I: 0.03 ng/mL (ref <0.031)

## 2015-08-25 NOTE — Patient Instructions (Signed)
Nice to see you. It is concerning that you're short of breath with exertion. I believe you should be evaluated in the emergency room given your constellation of symptoms for a quicker workup. Please go to the emergency room immediately for evaluation.

## 2015-08-25 NOTE — Assessment & Plan Note (Addendum)
She presents with exertional shortness of breath over the last week. She is in A. fib at this time. Highest heart rate in the office was 114. Lowest oxygen saturation was 90% in the office. Concern would be that the patient's heart rate is intermittently higher than this when active leading to her shortness of breath. Given patient's shortness of breath and increased heart rate with A. fib at rest felt it was warranted to get a more immediate evaluation in the emergency room with lab work and chest x-ray to evaluate this issue further. Discussed transport by EMS, though patient opted for private vehicle transport. Discussed precautions to call EMS in route. CMA called the charge nurse to inform them that the patient was on her way.

## 2015-08-25 NOTE — ED Notes (Signed)
Ambulated patient with 2-person assist Christine. O2 stats remained at 97%.

## 2015-08-25 NOTE — ED Notes (Signed)
Reports getting sob with exertion x 1 wk, states she went in today for her regular check up and told her to come here for rapid a fib

## 2015-08-25 NOTE — Discharge Instructions (Signed)
Please seek medical attention for any high fevers, chest pain, shortness of breath, change in behavior, persistent vomiting, bloody stool or any other new or concerning symptoms. ° ° °Shortness of Breath °Shortness of breath means you have trouble breathing. It could also mean that you have a medical problem. You should get immediate medical care for shortness of breath. °CAUSES  °· Not enough oxygen in the air such as with high altitudes or a smoke-filled room. °· Certain lung diseases, infections, or problems. °· Heart disease or conditions, such as angina or heart failure. °· Low red blood cells (anemia). °· Poor physical fitness, which can cause shortness of breath when you exercise. °· Chest or back injuries or stiffness. °· Being overweight. °· Smoking. °· Anxiety, which can make you feel like you are not getting enough air. °DIAGNOSIS  °Serious medical problems can often be found during your physical exam. Tests may also be done to determine why you are having shortness of breath. Tests may include: °· Chest X-rays. °· Lung function tests. °· Blood tests. °· An electrocardiogram (ECG). °· An ambulatory electrocardiogram. An ambulatory ECG records your heartbeat patterns over a 24-hour period. °· Exercise testing. °· A transthoracic echocardiogram (TTE). During echocardiography, sound waves are used to evaluate how blood flows through your heart. °· A transesophageal echocardiogram (TEE). °· Imaging scans. °Your health care provider may not be able to find a cause for your shortness of breath after your exam. In this case, it is important to have a follow-up exam with your health care provider as directed.  °TREATMENT  °Treatment for shortness of breath depends on the cause of your symptoms and can vary greatly. °HOME CARE INSTRUCTIONS  °· Do not smoke. Smoking is a common cause of shortness of breath. If you smoke, ask for help to quit. °· Avoid being around chemicals or things that may bother your breathing,  such as paint fumes and dust. °· Rest as needed. Slowly resume your usual activities. °· If medicines were prescribed, take them as directed for the full length of time directed. This includes oxygen and any inhaled medicines. °· Keep all follow-up appointments as directed by your health care provider. °SEEK MEDICAL CARE IF:  °· Your condition does not improve in the time expected. °· You have a hard time doing your normal activities even with rest. °· You have any new symptoms. °SEEK IMMEDIATE MEDICAL CARE IF:  °· Your shortness of breath gets worse. °· You feel light-headed, faint, or develop a cough not controlled with medicines. °· You start coughing up blood. °· You have pain with breathing. °· You have chest pain or pain in your arms, shoulders, or abdomen. °· You have a fever. °· You are unable to walk up stairs or exercise the way you normally do. °MAKE SURE YOU: °· Understand these instructions. °· Will watch your condition. °· Will get help right away if you are not doing well or get worse. °  °This information is not intended to replace advice given to you by your health care provider. Make sure you discuss any questions you have with your health care provider. °  °Document Released: 03/13/2001 Document Revised: 06/23/2013 Document Reviewed: 09/03/2011 °Elsevier Interactive Patient Education ©2016 Elsevier Inc. ° °

## 2015-08-25 NOTE — Progress Notes (Signed)
Pre visit review using our clinic review tool, if applicable. No additional management support is needed unless otherwise documented below in the visit note. 

## 2015-08-25 NOTE — Progress Notes (Signed)
Patient ID: Amanda Soto, female   DOB: June 04, 1925, 82 y.o.   MRN: [id number]  [name], MD Phone: [phone]  Amanda Soto is a 82 y.o. female who presents today for follow-up.  Patient notes over the last week she has developed shortness of breath with walking. She also feels overall weak. She has some mild dizziness particularly described as lightheaded when rising. She denies chest pains and palpitations. No numbness or focal weakness. No vision changes. No loss of consciousness. No fevers. No cough. No orthopnea. No PND. She notes she will get warm and feel cold. She has had some weight gain as well. She has a history of atrial fibrillation. She is currently on Eliquis. She's also on amiodarone. She additionally has a history of CHF. No swelling in her legs.  PMH: nonsmoker.   ROS see HPI  Objective  Physical Exam Filed Vitals:   08/25/15 1321  BP: 118/78  Pulse: 84  Temp: 97.4 F (36.3 C)    BP Readings from Last 3 Encounters:  08/25/15 115/40  08/25/15 118/78  07/25/15 110/68   Wt Readings from Last 3 Encounters:  08/25/15 133 lb (60.328 kg)  08/25/15 133 lb (60.328 kg)  07/25/15 132 lb (59.875 kg)   Laying blood pressure 112/74 pulse 102 Sitting blood pressure 98/64 pulse 64 Standing blood pressure 96/66 pulse 74  Physical Exam  Constitutional: No distress.  HENT:  Head: Normocephalic and atraumatic.  Right Ear: External ear normal.  Left Ear: External ear normal.  Mouth/Throat: Oropharynx is clear and moist. No oropharyngeal exudate.  Eyes: Conjunctivae are normal. Pupils are equal, round, and reactive to light.  Neck: Neck supple.  Cardiovascular: Exam reveals no gallop and no friction rub.   No murmur heard. Irregularly irregular, tachycardic  Pulmonary/Chest: Effort normal and breath sounds normal. No respiratory distress. She has no wheezes. She has no rales.  Abdominal: Soft. Bowel sounds are normal. She exhibits no distension. There is no  tenderness. There is no rebound and no guarding.  Musculoskeletal: She exhibits no edema.  Lymphadenopathy:    She has no cervical adenopathy.  Neurological: She is alert.  CN 2-12 intact, 5/5 strength in bilateral biceps, triceps, grip, quads, hamstrings, plantar and dorsiflexion, sensation to light touch intact in bilateral UE and LE, normal gait, 2+ patellar reflexes, negative Romberg, no pronator drift  Skin: Skin is warm and dry. She is not diaphoretic.   EKG: Atrial fibrillation, rate 114, left bundle branch block  Assessment/Plan: Please see individual problem list.  Exertional shortness of breath She presents with exertional shortness of breath over the last week. She is in A. fib at this time. Highest heart rate in the office was 114. Lowest oxygen saturation was 90% in the office. Concern would be that the patient's heart rate is intermittently higher than this when active leading to her shortness of breath. Given patient's shortness of breath and increased heart rate with A. fib at rest felt it was warranted to get a more immediate evaluation in the emergency room with lab work and chest x-ray to evaluate this issue further. Discussed transport by EMS, though patient opted for private vehicle transport. Discussed precautions to call EMS in route. CMA called the charge nurse to inform them that the patient was on her way.    Orders Placed This Encounter  Procedures  . EKG 12-Lead     [name]

## 2015-08-25 NOTE — ED Provider Notes (Signed)
[facility] Emergency Department Provider Note   ____________________________________________  Time seen: ~1930  I have reviewed the triage vital signs and the nursing notes.   HISTORY  Chief Complaint Shortness of Breath   History limited by: Not Limited   HPI Amanda Soto is a 82 y.o. female who presented to the emergency department today from primary care doctor's office because of concerns for weakness and shortness of breath. Patient states that this was a scheduled checkup. She states that for the past roughly week or so she has been feeling more short of breath. She has been having worsening shortness of breath while ambulating.The patient has not had any associated cough. Additionally she has not noticed any associated shortness of breath. At the doctor's office she was noted to be in A. fib with a heart rate into the mid teens. Patient states she has had one admission in the past for rapid atrial fibrillation that was a couple of years ago. She denies any recent fevers, nausea or vomiting    Past Medical History  Diagnosis Date  . CHF (congestive heart failure) ([place])   . Hypertension   . Hyperlipidemia   . Atrial fibrillation ([place])   . Hypothyroid   . Arthritis   . Depression   . Skin cancer   . Chickenpox   . Diverticulitis   . GERD (gastroesophageal reflux disease)   . Heart murmur     Patient Active Problem List   Diagnosis Date Noted  . Exertional shortness of breath 08/25/2015  . Back pain 07/27/2015  . Abdominal discomfort 07/27/2015  . Chronic systolic heart failure ([place]) 11/09/2014  . Bradycardia 11/09/2014  . Atrial fibrillation ([place]) 11/09/2014  . HTN (hypertension) 11/09/2014    Past Surgical History  Procedure Laterality Date  . Cataract extraction    . Partial hysterectomy    . Appendectomy    . Tonsillectomy      Current Outpatient Rx  Name  Route  Sig  Dispense  Refill  . amiodarone (PACERONE) 400 MG tablet    Oral   Take 100 mg by mouth daily.          Marland Kitchen apixaban (ELIQUIS) 2.5 MG TABS tablet   Oral   Take 2.5 mg by mouth 2 (two) times daily.         Marland Kitchen atorvastatin (LIPITOR) 10 MG tablet   Oral   Take 10 mg by mouth daily.         . furosemide (LASIX) 20 MG tablet   Oral   Take 20 mg by mouth daily.         Marland Kitchen levothyroxine (SYNTHROID, LEVOTHROID) 50 MCG tablet   Oral   Take 50 mcg by mouth daily before breakfast.         . traMADol (ULTRAM) 50 MG tablet   Oral   Take 2 tablets (100 mg total) by mouth every 12 (twelve) hours as needed for moderate pain.   100 tablet   0     Allergies Review of patient's allergies indicates no known allergies.  Family History  Problem Relation Age of Onset  . Stroke Maternal Grandmother   . Breast cancer Mother   . Breast cancer Sister   . Heart disease      Parent, grandparent, sons  . Hypertension      Parent, grandparent    Social History Social History  Substance Use Topics  . Smoking status: Never Smoker   . Smokeless tobacco: Never  Used  . Alcohol Use: 0.0 oz/week    0 Standard drinks or equivalent per week     Comment: occasional alcohol    Review of Systems  Constitutional: Negative for fever. Cardiovascular: Negative for chest pain. Respiratory: Positive for shortness of breath. Gastrointestinal: Negative for abdominal pain, vomiting and diarrhea. Neurological: Negative for headaches, focal weakness or numbness.  10-point ROS otherwise negative.  ____________________________________________   PHYSICAL EXAM:  VITAL SIGNS: ED Triage Vitals  Enc Vitals Group     BP 08/25/15 1443 115/40 mmHg     Pulse Rate 08/25/15 1443 105     Resp 08/25/15 1443 20     Temp 08/25/15 1443 97.8 F (36.6 C)     Temp Source 08/25/15 1443 Oral     SpO2 08/25/15 1443 96 %     Weight 08/25/15 1443 133 lb (60.328 kg)     Height 08/25/15 1443 4\' 11"  (1.499 m)     Head Cir --      Peak Flow --      Pain Score 08/25/15  1444 1   Constitutional: Alert and oriented. Well appearing and in no distress. Eyes: Conjunctivae are normal. PERRL. Normal extraocular movements. ENT   Head: Normocephalic and atraumatic.   Nose: No congestion/rhinnorhea.   Mouth/Throat: Mucous membranes are moist.   Neck: No stridor. Hematological/Lymphatic/Immunilogical: No cervical lymphadenopathy. Cardiovascular: Irregularly irregular rhythm.  No murmurs, rubs, or gallops. Respiratory: Normal respiratory effort without tachypnea nor retractions. Breath sounds are clear and equal bilaterally. No wheezes/rales/rhonchi. Gastrointestinal: Soft and nontender. No distention. There is no CVA tenderness. Genitourinary: Deferred Musculoskeletal: Normal range of motion in all extremities. No joint effusions.  No lower extremity tenderness nor edema. Neurologic:  Normal speech and language. No gross focal neurologic deficits are appreciated.  Skin:  Skin is warm, dry and intact. No rash noted. Psychiatric: Mood and affect are normal. Speech and behavior are normal. Patient exhibits appropriate insight and judgment.  ____________________________________________    LABS (pertinent positives/negatives)  Labs Reviewed  COMPREHENSIVE METABOLIC PANEL - Abnormal; Notable for the following:    Chloride 100 (*)    Glucose, Bld 165 (*)    All other components within normal limits  CBC WITH DIFFERENTIAL/PLATELET - Abnormal; Notable for the following:    RBC 3.61 (*)    Hemoglobin 11.3 (*)    HCT 34.0 (*)    All other components within normal limits  TROPONIN I    ____________________________________________   EKG  I, Nance Pear, attending physician, personally viewed and interpreted this EKG  EKG Time: 1447 Rate: 97 Rhythm: atrial fibrillation Axis: left axis deviation Intervals: qtc 431 QRS: LBBB ST changes: no st elevation equivalent Impression: abnormal ekg  LBBB present august 7th  2015 ____________________________________________    RADIOLOGY  CXR IMPRESSION: No acute chest findings.  ____________________________________________   PROCEDURES  Procedure(s) performed: None  Critical Care performed: No  ____________________________________________   INITIAL IMPRESSION / ASSESSMENT AND PLAN / ED COURSE  Pertinent labs & imaging results that were available during my care of the patient were reviewed by me and considered in my medical decision making (see chart for details).  Patient came to the emergency department today from primary care because of concerns for fast heart rate and shortness of breath. Patient's heart rate in the emergency department variably minimally tachypneic 2 normal rate. Patient did not have any concerning findings on workup. We did ambulate the patient with a good ambulatory O2 sat. Patient additionally states that she felt  well during the ambulation and stated that she did not feel short of breath. Patient does have a history of atrial fibrillation and is on a liquids for this. This point no concerning signs of heart damage or lung disease. Will plan on discharging home to follow up with primary care.  ____________________________________________   FINAL CLINICAL IMPRESSION(S) / ED DIAGNOSES  Final diagnoses:  Shortness of breath     Nance Pear, MD 08/25/15 2248

## 2015-08-26 ENCOUNTER — Telehealth: Payer: Self-pay | Admitting: Family Medicine

## 2015-08-26 ENCOUNTER — Telehealth: Payer: Self-pay | Admitting: Physical Therapy

## 2015-08-26 ENCOUNTER — Ambulatory Visit: Payer: Medicare Other | Admitting: Physical Therapy

## 2015-08-26 NOTE — Telephone Encounter (Signed)
I am unsure if the patient has been evaluated for sleep apnea in the past. She has not since I have taken over her care. We will discuss this further with her at her next office visit.

## 2015-08-26 NOTE — Telephone Encounter (Signed)
PT called pt to f/u on appt. Pt stated she went to the ED due to A-fib episode. Pt reported she is following with her PCP. PT will await clearance from MD to continue with next appt on 3/6@2pm . Pt stated her abdominal pain has improved by 95% but her back pain continues to limit her activities. PT explained to pt the focus on deep core strengthening and spinal flexibility at future visits to address her remaining goals. Pt voiced understanding.

## 2015-08-26 NOTE — Telephone Encounter (Signed)
Amanda Soto [id number] PT called from [facility] regarding pt being referred to her for abdomen pain and she had 95 percent improvement with that. Pt missed her appt pt stated she had a Afib episode and went to the ED. Amanda Soto would like to get verbal order to continue PT or if any precaution with her cardiac condition pt next appt is in two weeks. She plan on doing gentle stretches and low intensity activities. Thank you!

## 2015-08-26 NOTE — Telephone Encounter (Signed)
Continue, Amanda Soto called back regarding based on the resrearch that she came across she was wondering if pt had been screened  for sleep apnea it's a association with sleep apnea and Afib she will also fax over some articles. Thank you!

## 2015-08-26 NOTE — Telephone Encounter (Signed)
PT left messages w/ RN at  Dr. Ellen Henri office (Pt's PCP)  requesting clearance for PT (next appt on 3/6) 2/2 pt's recent A-fib episode and RN at Ms. Bridge's office (pt's referring provider) updating her on her recent medical status and progress on abdominal pain. RNs verbalized message will be relayed to providers.

## 2015-08-26 NOTE — Telephone Encounter (Signed)
PT spoke with Burr Medico (PA-C) pt's referring provider and inquired whether pt may benefit from getting screened for OSA, given research showing the correlation with OSA and A-fib. She deferred this question to her PCP.  PT left message with RN of Dr. Biagio Quint re: this question and faxed articles. PT also emailed articles to grandson.   [email or web address]  [email or web address]

## 2015-08-26 NOTE — Telephone Encounter (Signed)
Please advise 

## 2015-08-31 ENCOUNTER — Other Ambulatory Visit: Payer: Self-pay | Admitting: Family Medicine

## 2015-08-31 MED ORDER — TRAMADOL HCL 50 MG PO TABS: 100.0000 mg | ORAL_TABLET | Freq: Two times a day (BID) | ORAL | Status: DC | PRN

## 2015-08-31 NOTE — Telephone Encounter (Signed)
Pt called needing a refill for traMADol (ULTRAM) 50 MG tablet. Pharmacy is [id number] [place], [place] - [address]. Call pt @ [phone]. Pt tried calling [facility] and the number was disconnected and I also tried calling as well same message on number [phone] and [phone].  Thank you!

## 2015-08-31 NOTE — Telephone Encounter (Signed)
Pt is requesting a refill on tramadol 50mg . Pt's last OV 08/25/15, last filled 07/25/15 #100tabs with 0refills. Please advise, okay to refill. Thanks

## 2015-09-01 NOTE — Telephone Encounter (Signed)
Filled

## 2015-09-05 ENCOUNTER — Ambulatory Visit: Payer: Medicare Other | Attending: Gastroenterology | Admitting: Physical Therapy

## 2015-09-05 DIAGNOSIS — Z8719 Personal history of other diseases of the digestive system: Secondary | ICD-10-CM | POA: Diagnosis not present

## 2015-09-05 DIAGNOSIS — I480 Paroxysmal atrial fibrillation: Secondary | ICD-10-CM | POA: Diagnosis not present

## 2015-09-05 DIAGNOSIS — I499 Cardiac arrhythmia, unspecified: Secondary | ICD-10-CM | POA: Diagnosis not present

## 2015-09-05 DIAGNOSIS — M5440 Lumbago with sciatica, unspecified side: Secondary | ICD-10-CM | POA: Diagnosis not present

## 2015-09-06 ENCOUNTER — Encounter: Payer: Medicare Other | Admitting: Physical Therapy

## 2015-09-07 ENCOUNTER — Encounter: Payer: Self-pay | Admitting: Family Medicine

## 2015-09-07 ENCOUNTER — Ambulatory Visit (INDEPENDENT_AMBULATORY_CARE_PROVIDER_SITE_OTHER): Payer: Medicare Other | Admitting: Family Medicine

## 2015-09-07 VITALS — BP 132/74 | HR 91 | Temp 98.3°F | Ht <= 58 in | Wt 133.0 lb

## 2015-09-07 DIAGNOSIS — R7309 Other abnormal glucose: Secondary | ICD-10-CM | POA: Diagnosis not present

## 2015-09-07 DIAGNOSIS — M545 Low back pain, unspecified: Secondary | ICD-10-CM

## 2015-09-07 DIAGNOSIS — I482 Chronic atrial fibrillation, unspecified: Secondary | ICD-10-CM

## 2015-09-07 DIAGNOSIS — G8929 Other chronic pain: Secondary | ICD-10-CM

## 2015-09-07 DIAGNOSIS — D649 Anemia, unspecified: Secondary | ICD-10-CM

## 2015-09-07 LAB — HEMOGLOBIN A1C: HEMOGLOBIN A1C: 5.9 % (ref 4.6–6.5)

## 2015-09-07 LAB — CBC
HCT: 32.1 % — ABNORMAL LOW (ref 36.0–46.0)
Hemoglobin: 10.4 g/dL — ABNORMAL LOW (ref 12.0–15.0)
MCHC: 32.5 g/dL (ref 30.0–36.0)
MCV: 93 fl (ref 78.0–100.0)
PLATELETS: 324 10*3/uL (ref 150.0–400.0)
RBC: 3.46 Mil/uL — AB (ref 3.87–5.11)
RDW: 14.3 % (ref 11.5–15.5)
WBC: 9.1 10*3/uL (ref 4.0–10.5)

## 2015-09-07 NOTE — Assessment & Plan Note (Signed)
Noted on lab work from the emergency room. We will check an A1c today.

## 2015-09-07 NOTE — Assessment & Plan Note (Signed)
Rate control today. Asymptomatic. She will continue to monitor. She was mildly anemic and given that she is on anticoagulation we will check another CBC. Given return precautions.

## 2015-09-07 NOTE — Progress Notes (Signed)
Patient ID: Amanda Soto, female   DOB: 07-14-1924, 82 y.o.   MRN: [id number]  [name], MD Phone: [phone]  Amanda Soto is a 82 y.o. female who presents today for follow-up.  Atrial fibrillation: Patient sent to the hospital after last visit for elevated heart rate. She had a negative troponin and her heart rate returned to normal range. Negative chest x-ray as well. Since then no palpitations, shortness breath, or chest pain. She has followed up with her cardiologist. She feels well from this perspective.  Elevated glucose: This was noted when in the emergency room. She notes no history of diabetes. No polyuria or polydipsia. No prior A1c check per her report. She was also noted to be mildly anemic in the emergency room.  Chronic low back pain: Patient notes chronic lower back pain in the right side. Review of her last lumbar spine film revealed degenerative changes. She's undergone multiple epidurals, injections, physical therapy, chiropractor, and acupuncture for this. She takes tramadol now that doesn't help very much. She notes infrequent bilateral lateral lower extremity numbness just below her knees that occurs very infrequently. No bowel or bladder incontinence. No saddle anesthesia. No fevers. She does have a history of skin cancer. She is interested in a referral to pain management.  PMH: nonsmoker.   ROS see HPI  Objective  Physical Exam Filed Vitals:   09/07/15 1429  BP: 132/74  Pulse: 91  Temp: 98.3 F (36.8 C)    BP Readings from Last 3 Encounters:  09/07/15 132/74  08/25/15 133/68  08/25/15 118/78   Wt Readings from Last 3 Encounters:  09/07/15 133 lb (60.328 kg)  08/25/15 133 lb (60.328 kg)  08/25/15 133 lb (60.328 kg)    Physical Exam  Constitutional: No distress.  HENT:  Head: Normocephalic and atraumatic.  Right Ear: External ear normal.  Left Ear: External ear normal.  Cardiovascular:  Irregularly irregular, normal rate    Pulmonary/Chest: Effort normal and breath sounds normal. No respiratory distress. She has no wheezes. She has no rales.  Musculoskeletal:  No midline spine tenderness or step off, mild right low back muscular tenderness with no skin changes or swelling  Neurological: She is alert. Gait normal.  5/5 strength in bilateral quads, hamstrings, plantar and dorsiflexion, sensation to light touch intact in bilateral LE, normal gait with help of walker, absent patellar reflexes  Skin: Skin is warm and dry. She is not diaphoretic.     Assessment/Plan: Please see individual problem list.  Atrial fibrillation Rate control today. Asymptomatic. She will continue to monitor. She was mildly anemic and given that she is on anticoagulation we will check another CBC. Given return precautions.  Back pain Chronic issue. Neurologically intact at this time. Discussed potentially repeating imaging though this has not changed recently and she is neurologically intact thus we will hold off on further imaging at this time. We will refer to pain management for further evaluation given persistence. If there is any change to her discomfort would consider MRI. Given return precautions  Elevated glucose Noted on lab work from the emergency room. We will check an A1c today.    Orders Placed This Encounter  Procedures  . HgB A1c  . CBC  . Ambulatory referral to Pain Clinic    Referral Priority:  Routine    Referral Type:  Consultation    Referral Reason:  Specialty Services Required    Requested Specialty:  Pain Medicine    Number of Visits Requested:  1  [name], MD [place]

## 2015-09-07 NOTE — Patient Instructions (Addendum)
Nice to see you. We will place a referral to the pain clinic for your back pain. Please monitor for palpitations, chest pain, and shortness of breath. If these occur seek medical attention.  We will check an A1c today to evaluate for diabetes. If you develop chest pain, shortness of breath, numbness, weakness, loss of bowel or bladder function, numbness between your legs, fevers, or any new or changing symptoms please seek medical attention.

## 2015-09-07 NOTE — Progress Notes (Signed)
Pre visit review using our clinic review tool, if applicable. No additional management support is needed unless otherwise documented below in the visit note. 

## 2015-09-07 NOTE — Assessment & Plan Note (Signed)
Chronic issue. Neurologically intact at this time. Discussed potentially repeating imaging though this has not changed recently and she is neurologically intact thus we will hold off on further imaging at this time. We will refer to pain management for further evaluation given persistence. If there is any change to her discomfort would consider MRI. Given return precautions

## 2015-09-09 ENCOUNTER — Telehealth: Payer: Self-pay | Admitting: *Deleted

## 2015-09-09 DIAGNOSIS — D649 Anemia, unspecified: Secondary | ICD-10-CM

## 2015-09-09 NOTE — Telephone Encounter (Signed)
Pt coming in Monday for labs. Needs orders placed

## 2015-09-12 ENCOUNTER — Other Ambulatory Visit (INDEPENDENT_AMBULATORY_CARE_PROVIDER_SITE_OTHER): Payer: Medicare Other

## 2015-09-12 DIAGNOSIS — D649 Anemia, unspecified: Secondary | ICD-10-CM | POA: Diagnosis not present

## 2015-09-12 LAB — CBC
HEMATOCRIT: 33.3 % — AB (ref 36.0–46.0)
Hemoglobin: 10.8 g/dL — ABNORMAL LOW (ref 12.0–15.0)
MCHC: 32.5 g/dL (ref 30.0–36.0)
MCV: 91.5 fl (ref 78.0–100.0)
PLATELETS: 364 10*3/uL (ref 150.0–400.0)
RBC: 3.64 Mil/uL — AB (ref 3.87–5.11)
RDW: 14.6 % (ref 11.5–15.5)
WBC: 9.1 10*3/uL (ref 4.0–10.5)

## 2015-09-12 NOTE — Telephone Encounter (Signed)
Orders placed.

## 2015-09-16 ENCOUNTER — Encounter: Payer: Medicare Other | Admitting: Physical Therapy

## 2015-09-19 ENCOUNTER — Ambulatory Visit: Payer: Medicare Other | Admitting: Physical Therapy

## 2015-09-19 ENCOUNTER — Encounter: Payer: Self-pay | Admitting: Physical Therapy

## 2015-09-19 ENCOUNTER — Telehealth: Payer: Self-pay | Admitting: Physical Therapy

## 2015-09-19 DIAGNOSIS — R279 Unspecified lack of coordination: Secondary | ICD-10-CM

## 2015-09-19 DIAGNOSIS — Z7409 Other reduced mobility: Secondary | ICD-10-CM

## 2015-09-19 DIAGNOSIS — M629 Disorder of muscle, unspecified: Secondary | ICD-10-CM

## 2015-09-19 DIAGNOSIS — M533 Sacrococcygeal disorders, not elsewhere classified: Secondary | ICD-10-CM

## 2015-09-19 NOTE — Telephone Encounter (Signed)
PT called pt to f/u. Pt reported she had called the front desk that she would not be able to come for a while. Pt told PT that her blood levels are being monitored as her energy is low. Pt's primary concern is her back pain.  Her abdominal pain has improved by 80%. Pt will be visiting Pain Clinic as recommended by her MD and cardiologist.  PT explained PT can be beneficial when she is ready to return. Pt voiced understanding.

## 2015-09-19 NOTE — Therapy (Addendum)
[place] [facility] SERVICES [address] [place], [place], [address] Phone: [phone]   Fax:  [phone]  Patient Details  Name: Amanda Soto MRN: [id number] Date of Birth: 1925/03/02 Referring Provider:  Burr Medico   Encounter Date: 09/19/2015   Discharge Summary  Pt has reported her abdominal pain has improved by 80% across two visits. At her last visit, pt's abdominal scar showed significantly decreased scar restriction. Pt is getting d/c at this time as pt has not attended PT for one month 2/2/ A-fib epsiode and additional medical conditions.    PT called pt to f/u today. Pt reported she had called the front desk that she would not be able to come to PT for a while. Pt told PT that her blood levels are being monitored as her energy is low.  Pt's primary concern now  is her back pain.Pt will be visiting Pain Clinic as recommended by her MD and cardiologist. PT explained PT can help with her LBP and when she is ready to return, a new order will be requested from MD for treatment of LBP. Pt voiced understanding.   Pt would benefit from [facility] in order to help pt manage her LBP and perform ADLs.   Thank you for your referral! Lianne Cure  [id number] [email or web address]       Jerl Mina ,PT, DPT, E-RYT  09/19/2015, 3:52 PM  [place] [facility] SERVICES [address] [place], [place], [address] Phone: [phone]   Fax:  [phone]

## 2015-09-23 ENCOUNTER — Telehealth: Payer: Self-pay | Admitting: Physical Therapy

## 2015-09-23 ENCOUNTER — Telehealth: Payer: Self-pay | Admitting: Family Medicine

## 2015-09-23 ENCOUNTER — Encounter: Payer: Medicare Other | Admitting: Physical Therapy

## 2015-09-23 DIAGNOSIS — G8929 Other chronic pain: Secondary | ICD-10-CM

## 2015-09-23 DIAGNOSIS — M545 Low back pain: Principal | ICD-10-CM

## 2015-09-23 NOTE — Telephone Encounter (Signed)
She was referred to Dr. Julious Oka for Right lower abdominal pain through Burr Medico at [place]. She also has issues with her lower back ,however given her other health issues she thinks that the patient would benefit by having home health PT. The patient agrees with the physician. If you are needing to contact Dr. Annamaria Boots her direct # is [phone].

## 2015-09-23 NOTE — Telephone Encounter (Signed)
1) Pelvic health PT called Jaynie Collins -Constance Haw, PA-C, referring provider for her R abd pain and informed her of pt's significant improvement with R LQ pain. PT recommended that pt receive Home Health PT for her remaining c/o LBP 2/2 severe thoracic kyphosis. Pain is limited her ADLs, including driving. Janic Ann-Bridges referred PT to contact PCP with this recommendation.   2) PT left message at Dr. Ellen Henri office re: this recommendation.   3) PT spoke with pt and she thinks Home Health PT would be helpful for her. Pt was concerned about insurance coverage, PT informed pt to call her MD office and insurance to gather more information about coverage.  Pt's son was also emailed.   Jerl Mina, PT, DPT, E-RYT  [phone]

## 2015-09-26 ENCOUNTER — Other Ambulatory Visit (INDEPENDENT_AMBULATORY_CARE_PROVIDER_SITE_OTHER): Payer: Medicare Other

## 2015-09-26 ENCOUNTER — Encounter: Payer: Medicare Other | Admitting: Physical Therapy

## 2015-09-26 DIAGNOSIS — D508 Other iron deficiency anemias: Secondary | ICD-10-CM

## 2015-09-26 NOTE — Telephone Encounter (Signed)
Please advise 

## 2015-09-27 ENCOUNTER — Other Ambulatory Visit: Payer: Self-pay | Admitting: Surgical

## 2015-09-27 ENCOUNTER — Other Ambulatory Visit: Payer: Medicare Other

## 2015-09-27 DIAGNOSIS — D508 Other iron deficiency anemias: Secondary | ICD-10-CM

## 2015-09-28 ENCOUNTER — Other Ambulatory Visit: Payer: Self-pay | Admitting: Family Medicine

## 2015-09-28 LAB — FECAL OCCULT BLOOD, IMMUNOCHEMICAL: FECAL OCCULT BLD: NEGATIVE

## 2015-09-29 NOTE — Telephone Encounter (Signed)
Please advise on refill of Tramadol. 

## 2015-09-30 ENCOUNTER — Encounter: Payer: Medicare Other | Admitting: Physical Therapy

## 2015-10-01 NOTE — Telephone Encounter (Signed)
Order placed for home health PT.

## 2015-10-03 DIAGNOSIS — R21 Rash and other nonspecific skin eruption: Secondary | ICD-10-CM | POA: Diagnosis not present

## 2015-10-05 DIAGNOSIS — R262 Difficulty in walking, not elsewhere classified: Secondary | ICD-10-CM | POA: Diagnosis not present

## 2015-10-05 DIAGNOSIS — I4891 Unspecified atrial fibrillation: Secondary | ICD-10-CM | POA: Diagnosis not present

## 2015-10-05 DIAGNOSIS — M545 Low back pain: Secondary | ICD-10-CM | POA: Diagnosis not present

## 2015-10-05 DIAGNOSIS — I5022 Chronic systolic (congestive) heart failure: Secondary | ICD-10-CM | POA: Diagnosis not present

## 2015-10-06 ENCOUNTER — Telehealth: Payer: Self-pay | Admitting: Family Medicine

## 2015-10-06 NOTE — Telephone Encounter (Signed)
Left message on PT voice mail that it was ok to do PT. If he has any questions can call back.

## 2015-10-06 NOTE — Telephone Encounter (Signed)
Amanda Soto [phone] called from [facility] home health PT. Pt was eval with sig pain and weakness of lower extremity increase fall risk. Pt will be seen two times a week for three weeks to treat those issues. Verbal approval. Thank you!

## 2015-10-07 DIAGNOSIS — I5022 Chronic systolic (congestive) heart failure: Secondary | ICD-10-CM | POA: Diagnosis not present

## 2015-10-07 DIAGNOSIS — I4891 Unspecified atrial fibrillation: Secondary | ICD-10-CM | POA: Diagnosis not present

## 2015-10-07 DIAGNOSIS — R262 Difficulty in walking, not elsewhere classified: Secondary | ICD-10-CM | POA: Diagnosis not present

## 2015-10-07 DIAGNOSIS — M545 Low back pain: Secondary | ICD-10-CM | POA: Diagnosis not present

## 2015-10-10 DIAGNOSIS — I4891 Unspecified atrial fibrillation: Secondary | ICD-10-CM | POA: Diagnosis not present

## 2015-10-10 DIAGNOSIS — I5022 Chronic systolic (congestive) heart failure: Secondary | ICD-10-CM | POA: Diagnosis not present

## 2015-10-10 DIAGNOSIS — M545 Low back pain: Secondary | ICD-10-CM | POA: Diagnosis not present

## 2015-10-10 DIAGNOSIS — R262 Difficulty in walking, not elsewhere classified: Secondary | ICD-10-CM | POA: Diagnosis not present

## 2015-10-13 DIAGNOSIS — I5022 Chronic systolic (congestive) heart failure: Secondary | ICD-10-CM | POA: Diagnosis not present

## 2015-10-13 DIAGNOSIS — M545 Low back pain: Secondary | ICD-10-CM | POA: Diagnosis not present

## 2015-10-13 DIAGNOSIS — R262 Difficulty in walking, not elsewhere classified: Secondary | ICD-10-CM | POA: Diagnosis not present

## 2015-10-13 DIAGNOSIS — I4891 Unspecified atrial fibrillation: Secondary | ICD-10-CM | POA: Diagnosis not present

## 2015-10-17 DIAGNOSIS — R262 Difficulty in walking, not elsewhere classified: Secondary | ICD-10-CM | POA: Diagnosis not present

## 2015-10-17 DIAGNOSIS — M545 Low back pain: Secondary | ICD-10-CM | POA: Diagnosis not present

## 2015-10-17 DIAGNOSIS — I5022 Chronic systolic (congestive) heart failure: Secondary | ICD-10-CM | POA: Diagnosis not present

## 2015-10-17 DIAGNOSIS — I4891 Unspecified atrial fibrillation: Secondary | ICD-10-CM | POA: Diagnosis not present

## 2015-10-20 ENCOUNTER — Telehealth: Payer: Self-pay | Admitting: Surgical

## 2015-10-20 DIAGNOSIS — I4891 Unspecified atrial fibrillation: Secondary | ICD-10-CM | POA: Diagnosis not present

## 2015-10-20 DIAGNOSIS — I5022 Chronic systolic (congestive) heart failure: Secondary | ICD-10-CM | POA: Diagnosis not present

## 2015-10-20 DIAGNOSIS — R262 Difficulty in walking, not elsewhere classified: Secondary | ICD-10-CM | POA: Diagnosis not present

## 2015-10-20 DIAGNOSIS — M545 Low back pain: Secondary | ICD-10-CM | POA: Diagnosis not present

## 2015-10-20 NOTE — Telephone Encounter (Signed)
Noted  

## 2015-10-20 NOTE — Telephone Encounter (Signed)
Lemuel from [facility] called for verbal to extend PT for Amanda Soto. Going to extend 2 times a week for 1 week and then 1 time a week for 2 weeks. Gave verbal for this.

## 2015-10-24 DIAGNOSIS — M545 Low back pain: Secondary | ICD-10-CM | POA: Diagnosis not present

## 2015-10-24 DIAGNOSIS — I5022 Chronic systolic (congestive) heart failure: Secondary | ICD-10-CM | POA: Diagnosis not present

## 2015-10-24 DIAGNOSIS — I4891 Unspecified atrial fibrillation: Secondary | ICD-10-CM | POA: Diagnosis not present

## 2015-10-24 DIAGNOSIS — R262 Difficulty in walking, not elsewhere classified: Secondary | ICD-10-CM | POA: Diagnosis not present

## 2015-10-27 ENCOUNTER — Other Ambulatory Visit: Payer: Self-pay | Admitting: Family Medicine

## 2015-10-27 DIAGNOSIS — I4891 Unspecified atrial fibrillation: Secondary | ICD-10-CM | POA: Diagnosis not present

## 2015-10-27 DIAGNOSIS — I5022 Chronic systolic (congestive) heart failure: Secondary | ICD-10-CM | POA: Diagnosis not present

## 2015-10-27 DIAGNOSIS — M545 Low back pain: Secondary | ICD-10-CM | POA: Diagnosis not present

## 2015-10-27 DIAGNOSIS — R262 Difficulty in walking, not elsewhere classified: Secondary | ICD-10-CM | POA: Diagnosis not present

## 2015-10-27 NOTE — Telephone Encounter (Signed)
Refill given

## 2015-10-27 NOTE — Telephone Encounter (Signed)
Refill request for Tramadol 

## 2015-10-28 NOTE — Telephone Encounter (Signed)
RX faxed to pharmacy.

## 2015-10-31 DIAGNOSIS — R262 Difficulty in walking, not elsewhere classified: Secondary | ICD-10-CM | POA: Diagnosis not present

## 2015-10-31 DIAGNOSIS — I5022 Chronic systolic (congestive) heart failure: Secondary | ICD-10-CM | POA: Diagnosis not present

## 2015-10-31 DIAGNOSIS — M545 Low back pain: Secondary | ICD-10-CM | POA: Diagnosis not present

## 2015-10-31 DIAGNOSIS — I4891 Unspecified atrial fibrillation: Secondary | ICD-10-CM | POA: Diagnosis not present

## 2015-11-07 ENCOUNTER — Encounter: Payer: Self-pay | Admitting: Family Medicine

## 2015-11-07 ENCOUNTER — Ambulatory Visit (INDEPENDENT_AMBULATORY_CARE_PROVIDER_SITE_OTHER): Payer: Medicare Other | Admitting: Family Medicine

## 2015-11-07 VITALS — BP 102/64 | HR 96 | Temp 97.5°F | Ht <= 58 in | Wt 127.1 lb

## 2015-11-07 DIAGNOSIS — R42 Dizziness and giddiness: Secondary | ICD-10-CM

## 2015-11-07 DIAGNOSIS — E039 Hypothyroidism, unspecified: Secondary | ICD-10-CM

## 2015-11-07 DIAGNOSIS — M545 Low back pain, unspecified: Secondary | ICD-10-CM

## 2015-11-07 LAB — CBC
HCT: 25.1 % — ABNORMAL LOW (ref 36.0–46.0)
MCHC: 32.2 g/dL (ref 30.0–36.0)
MCV: 80.2 fl (ref 78.0–100.0)
Platelets: 369 10*3/uL (ref 150.0–400.0)
RBC: 3.13 Mil/uL — ABNORMAL LOW (ref 3.87–5.11)
RDW: 17.3 % — AB (ref 11.5–15.5)
WBC: 10.9 10*3/uL — AB (ref 4.0–10.5)

## 2015-11-07 LAB — TSH: TSH: 0.66 u[IU]/mL (ref 0.35–4.50)

## 2015-11-07 MED ORDER — TRAMADOL HCL ER 200 MG PO TB24: 200.0000 mg | ORAL_TABLET | Freq: Every day | ORAL | Status: DC

## 2015-11-07 NOTE — Assessment & Plan Note (Signed)
Intermittent issue. Orthostatics positive from a diastolic perspective. Blood pressures are borderline low. Per review of her cardiologist last note she was to hold antihypertensives. Based on her last cardiology note it appears her most recent EF was 55%. Discussed holding her Lasix and monitoring her weight as outlined in the AVS. We'll additionally check a CBC to ensure that she is not more anemic than previously. She's given return precautions.

## 2015-11-07 NOTE — Assessment & Plan Note (Signed)
Check TSH.  Continue Synthroid. 

## 2015-11-07 NOTE — Assessment & Plan Note (Signed)
Chronic issue. Not improved. No red flags. Neurologically intact. We'll trial tramadol extended release. Warned of this making her drowsy. She'll see the pain clinic next week. Given return precautions.

## 2015-11-07 NOTE — Progress Notes (Signed)
Pre-visit discussion using our clinic review tool. No additional management support is needed unless otherwise documented below in the visit note.  

## 2015-11-07 NOTE — Patient Instructions (Addendum)
Nice to see you. Please keep your follow-up appointment with pain clinic. Please rise slowly from seated or lying positions. You can hold the Lasix at this time. Please monitor your weight daily. If you develop weight gain of 3 pounds or more in one day or 5 pounds or more in one week please let us know immediately. If you develop shortness of breath or difficulty breathing when lying flat he seek medical attention. We will change you to the extended release tramadol. You will take this once a day. If you become too drowsy while taking this please let us know. If you develop chest pain, shortness of breath, palpitations, numbness, weakness, loss of bowel or bladder function, numbness between her legs, drowsiness, lightheadedness, or any new or changing symptoms please seek medical attention.

## 2015-11-07 NOTE — Progress Notes (Signed)
Patient ID: Amanda Soto, female   DOB: March 20, 1925, 82 y.o.   MRN: [id number]  [name], MD Phone: [phone]  Amanda Soto is a 82 y.o. female who presents today for follow-up visit.  Right low back pain: Patient notes this is stable. She's been doing physical therapy though this has not been very beneficial. She notes she is using a walker and this does help some. No numbness, weakness, loss of bowel or bladder function, saddle anesthesia, fevers, or history of cancer. She is being evaluated at the pain clinic on Monday. Tramadol 2 times daily does not help. Does not quite last long enough.  Occasionally she notes she does feel a little lightheaded. Can occur on standing or with sitting. Blood pressures at home typically run between 100-110/50-60. She not had any chest pain, shortness breath, or palpitations. She is taking her fluid pill.  HYPOTHYROIDISM Disease Monitoring Weight changes: None noted  Skin Changes: No changes though does note dry skin Palpitations: No       Does note feeling tired. No constipation. Medication Monitoring Compliance:  Taking Synthroid    PMH: nonsmoker.   ROS see history of present illness  Objective  Physical Exam Filed Vitals:   11/07/15 1411  BP: 102/64  Pulse: 96  Temp: 97.5 F (36.4 C)    BP Readings from Last 3 Encounters:  11/07/15 102/64  09/07/15 132/74  08/25/15 133/68   Wt Readings from Last 3 Encounters:  11/07/15 127 lb 2 oz (57.664 kg)  09/07/15 133 lb (60.328 kg)  08/25/15 133 lb (60.328 kg)   Laying blood pressure 120/82 pulse 85 Sitting blood pressure 108/72 pulse 87 Standing blood pressure 120/62 pulse 64  Physical Exam  Constitutional: She is well-developed, well-nourished, and in no distress.  HENT:  Head: Normocephalic and atraumatic.  Right Ear: External ear normal.  Left Ear: External ear normal.  Cardiovascular: Normal rate and normal heart sounds.  An irregularly irregular rhythm present.    Pulmonary/Chest: Effort normal and breath sounds normal.  Musculoskeletal: She exhibits no edema.  No midline spine tenderness, no midline spine step-off, no muscular back tenderness, no back swelling  Neurological: She is alert.  CN 2-12 intact, 5/5 strength in bilateral biceps, triceps, grip, quads, hamstrings, plantar and dorsiflexion, sensation to light touch intact in bilateral UE and LE, normal gait, 2+ patellar reflexes  Skin: Skin is warm and dry. She is not diaphoretic.     Assessment/Plan: Please see individual problem list.  Hypothyroidism Check TSH. Continue Synthroid.  Back pain Chronic issue. Not improved. No red flags. Neurologically intact. We'll trial tramadol extended release. Warned of this making her drowsy. She'll see the pain clinic next week. Given return precautions.  Lightheadedness Intermittent issue. Orthostatics positive from a diastolic perspective. Blood pressures are borderline low. Per review of her cardiologist last note she was to hold antihypertensives. Based on her last cardiology note it appears her most recent EF was 55%. Discussed holding her Lasix and monitoring her weight as outlined in the AVS. We'll additionally check a CBC to ensure that she is not more anemic than previously. She's given return precautions.    Orders Placed This Encounter  Procedures  . TSH  . CBC    Meds ordered this encounter  Medications  . traMADol (ULTRAM-ER) 200 MG 24 hr tablet    Sig: Take 1 tablet (200 mg total) by mouth daily.    Dispense:  30 tablet    Refill:  0  [name], MD [place]

## 2015-11-08 ENCOUNTER — Other Ambulatory Visit: Payer: Self-pay | Admitting: Family Medicine

## 2015-11-08 DIAGNOSIS — R262 Difficulty in walking, not elsewhere classified: Secondary | ICD-10-CM | POA: Diagnosis not present

## 2015-11-08 DIAGNOSIS — M545 Low back pain: Secondary | ICD-10-CM | POA: Diagnosis not present

## 2015-11-08 DIAGNOSIS — I5022 Chronic systolic (congestive) heart failure: Secondary | ICD-10-CM | POA: Diagnosis not present

## 2015-11-08 DIAGNOSIS — I4891 Unspecified atrial fibrillation: Secondary | ICD-10-CM | POA: Diagnosis not present

## 2015-11-08 DIAGNOSIS — D649 Anemia, unspecified: Secondary | ICD-10-CM

## 2015-11-09 ENCOUNTER — Other Ambulatory Visit (INDEPENDENT_AMBULATORY_CARE_PROVIDER_SITE_OTHER): Payer: Medicare Other

## 2015-11-09 ENCOUNTER — Encounter: Payer: Self-pay | Admitting: *Deleted

## 2015-11-09 ENCOUNTER — Telehealth: Payer: Self-pay | Admitting: *Deleted

## 2015-11-09 ENCOUNTER — Observation Stay
Admission: EM | Admit: 2015-11-09 | Discharge: 2015-11-11 | Disposition: A | Discharge status: Home / Self Care | Payer: Medicare Other | Attending: Internal Medicine | Admitting: Internal Medicine

## 2015-11-09 ENCOUNTER — Telehealth: Payer: Self-pay | Admitting: Family Medicine

## 2015-11-09 DIAGNOSIS — Z79899 Other long term (current) drug therapy: Secondary | ICD-10-CM | POA: Diagnosis not present

## 2015-11-09 DIAGNOSIS — Z90710 Acquired absence of both cervix and uterus: Secondary | ICD-10-CM | POA: Diagnosis not present

## 2015-11-09 DIAGNOSIS — I5022 Chronic systolic (congestive) heart failure: Secondary | ICD-10-CM | POA: Diagnosis not present

## 2015-11-09 DIAGNOSIS — E785 Hyperlipidemia, unspecified: Secondary | ICD-10-CM | POA: Insufficient documentation

## 2015-11-09 DIAGNOSIS — Z7901 Long term (current) use of anticoagulants: Secondary | ICD-10-CM | POA: Insufficient documentation

## 2015-11-09 DIAGNOSIS — I4891 Unspecified atrial fibrillation: Secondary | ICD-10-CM | POA: Diagnosis not present

## 2015-11-09 DIAGNOSIS — Z9049 Acquired absence of other specified parts of digestive tract: Secondary | ICD-10-CM | POA: Diagnosis not present

## 2015-11-09 DIAGNOSIS — D649 Anemia, unspecified: Secondary | ICD-10-CM

## 2015-11-09 DIAGNOSIS — R42 Dizziness and giddiness: Secondary | ICD-10-CM | POA: Diagnosis not present

## 2015-11-09 DIAGNOSIS — I110 Hypertensive heart disease with heart failure: Secondary | ICD-10-CM | POA: Diagnosis not present

## 2015-11-09 DIAGNOSIS — Z85828 Personal history of other malignant neoplasm of skin: Secondary | ICD-10-CM | POA: Diagnosis not present

## 2015-11-09 DIAGNOSIS — E039 Hypothyroidism, unspecified: Secondary | ICD-10-CM | POA: Insufficient documentation

## 2015-11-09 DIAGNOSIS — D509 Iron deficiency anemia, unspecified: Secondary | ICD-10-CM | POA: Diagnosis not present

## 2015-11-09 DIAGNOSIS — E876 Hypokalemia: Secondary | ICD-10-CM | POA: Diagnosis not present

## 2015-11-09 DIAGNOSIS — G8929 Other chronic pain: Secondary | ICD-10-CM | POA: Insufficient documentation

## 2015-11-09 DIAGNOSIS — Z881 Allergy status to other antibiotic agents status: Secondary | ICD-10-CM | POA: Insufficient documentation

## 2015-11-09 DIAGNOSIS — Z803 Family history of malignant neoplasm of breast: Secondary | ICD-10-CM | POA: Insufficient documentation

## 2015-11-09 DIAGNOSIS — Z8249 Family history of ischemic heart disease and other diseases of the circulatory system: Secondary | ICD-10-CM | POA: Insufficient documentation

## 2015-11-09 DIAGNOSIS — M549 Dorsalgia, unspecified: Secondary | ICD-10-CM | POA: Diagnosis not present

## 2015-11-09 DIAGNOSIS — M1990 Unspecified osteoarthritis, unspecified site: Secondary | ICD-10-CM | POA: Insufficient documentation

## 2015-11-09 DIAGNOSIS — F329 Major depressive disorder, single episode, unspecified: Secondary | ICD-10-CM | POA: Diagnosis not present

## 2015-11-09 DIAGNOSIS — Z823 Family history of stroke: Secondary | ICD-10-CM | POA: Insufficient documentation

## 2015-11-09 DIAGNOSIS — K219 Gastro-esophageal reflux disease without esophagitis: Secondary | ICD-10-CM | POA: Diagnosis not present

## 2015-11-09 DIAGNOSIS — Z885 Allergy status to narcotic agent status: Secondary | ICD-10-CM | POA: Insufficient documentation

## 2015-11-09 DIAGNOSIS — R531 Weakness: Secondary | ICD-10-CM | POA: Diagnosis not present

## 2015-11-09 HISTORY — DX: Anemia, unspecified: D64.9

## 2015-11-09 LAB — URINALYSIS COMPLETE WITH MICROSCOPIC (ARMC ONLY)
BACTERIA UA: NONE SEEN
BILIRUBIN URINE: NEGATIVE
GLUCOSE, UA: NEGATIVE mg/dL
Hgb urine dipstick: NEGATIVE
Ketones, ur: NEGATIVE mg/dL
NITRITE: NEGATIVE
Protein, ur: 30 mg/dL — AB
SPECIFIC GRAVITY, URINE: 1.019 (ref 1.005–1.030)
pH: 5 (ref 5.0–8.0)

## 2015-11-09 LAB — BASIC METABOLIC PANEL
Anion gap: 11 (ref 5–15)
BUN: 14 mg/dL (ref 6–20)
CALCIUM: 8.7 mg/dL — AB (ref 8.9–10.3)
CO2: 22 mmol/L (ref 22–32)
CREATININE: 0.66 mg/dL (ref 0.44–1.00)
Chloride: 102 mmol/L (ref 101–111)
GFR calc Af Amer: >60 mL/min (ref >60)
GLUCOSE: 139 mg/dL — AB (ref 65–99)
Potassium: 3.1 mmol/L — ABNORMAL LOW (ref 3.5–5.1)
SODIUM: 135 mmol/L (ref 135–145)

## 2015-11-09 LAB — CBC
HCT: 24.5 % — ABNORMAL LOW (ref 36.0–46.0)
HCT: 24 % — ABNORMAL LOW (ref 35.0–47.0)
Hemoglobin: 7.8 g/dL — CL (ref 12.0–15.0)
Hemoglobin: 7.5 g/dL — ABNORMAL LOW (ref 12.0–16.0)
MCH: 24.6 pg — ABNORMAL LOW (ref 26.0–34.0)
MCHC: 31.8 g/dL (ref 30.0–36.0)
MCHC: 31.2 g/dL — AB (ref 32.0–36.0)
MCV: 80.1 fl (ref 78.0–100.0)
MCV: 78.8 fL — ABNORMAL LOW (ref 80.0–100.0)
PLATELETS: 366 10*3/uL (ref 150.0–400.0)
PLATELETS: 329 10*3/uL (ref 150–440)
RBC: 3.06 Mil/uL — AB (ref 3.87–5.11)
RBC: 3.05 MIL/uL — ABNORMAL LOW (ref 3.80–5.20)
RDW: 17.2 % — ABNORMAL HIGH (ref 11.5–15.5)
RDW: 17.3 % — AB (ref 11.5–14.5)
WBC: 10.1 10*3/uL (ref 4.0–10.5)
WBC: 8.7 10*3/uL (ref 3.6–11.0)

## 2015-11-09 LAB — ABO/RH: ABO/RH(D): A POS

## 2015-11-09 LAB — PREPARE RBC (CROSSMATCH)

## 2015-11-09 LAB — TROPONIN I: Troponin I: <0.03 ng/mL (ref <0.031)

## 2015-11-09 MED ORDER — ONDANSETRON HCL 4 MG PO TABS: 4.0000 mg | ORAL_TABLET | Freq: Four times a day (QID) | ORAL | Status: DC | PRN

## 2015-11-09 MED ORDER — LEVOTHYROXINE SODIUM 75 MCG PO TABS
75.0000 ug | ORAL_TABLET | Freq: Every day | ORAL | Status: DC
  Administered 2015-11-10 – 2015-11-11 (×2): 75 ug via ORAL
  Filled 2015-11-09 (×2): qty 1

## 2015-11-09 MED ORDER — FUROSEMIDE 20 MG PO TABS
20.0000 mg | ORAL_TABLET | Freq: Every day | ORAL | Status: DC
  Administered 2015-11-10 – 2015-11-11 (×2): 20 mg via ORAL
  Filled 2015-11-09 (×2): qty 1

## 2015-11-09 MED ORDER — ACETAMINOPHEN 650 MG RE SUPP: 650.0000 mg | Freq: Four times a day (QID) | RECTAL | Status: DC | PRN

## 2015-11-09 MED ORDER — TRAZODONE HCL 50 MG PO TABS: 25.0000 mg | ORAL_TABLET | Freq: Every evening | ORAL | Status: DC | PRN

## 2015-11-09 MED ORDER — POTASSIUM CHLORIDE CRYS ER 20 MEQ PO TBCR
40.0000 meq | EXTENDED_RELEASE_TABLET | Freq: Once | ORAL | Status: AC
  Administered 2015-11-10: 40 meq via ORAL
  Filled 2015-11-09: qty 2

## 2015-11-09 MED ORDER — AMIODARONE HCL 200 MG PO TABS
100.0000 mg | ORAL_TABLET | Freq: Every day | ORAL | Status: DC
  Administered 2015-11-11: 100 mg via ORAL
  Filled 2015-11-09 (×2): qty 1

## 2015-11-09 MED ORDER — SODIUM CHLORIDE 0.9% FLUSH
3.0000 mL | Freq: Two times a day (BID) | INTRAVENOUS | Status: DC
Start: 2015-11-09 — End: 2015-11-11
  Administered 2015-11-10 – 2015-11-11 (×2): 3 mL via INTRAVENOUS

## 2015-11-09 MED ORDER — ONDANSETRON HCL 4 MG/2ML IJ SOLN: 4.0000 mg | Freq: Four times a day (QID) | INTRAMUSCULAR | Status: DC | PRN

## 2015-11-09 MED ORDER — TRAMADOL HCL 50 MG PO TABS
50.0000 mg | ORAL_TABLET | Freq: Four times a day (QID) | ORAL | Status: DC
  Administered 2015-11-10 – 2015-11-11 (×5): 50 mg via ORAL
  Filled 2015-11-09 (×5): qty 1

## 2015-11-09 MED ORDER — SODIUM CHLORIDE 0.9 % IV SOLN
INTRAVENOUS | Status: DC
Start: 2015-11-09 — End: 2015-11-10
  Administered 2015-11-10 (×2): via INTRAVENOUS

## 2015-11-09 MED ORDER — ACETAMINOPHEN 325 MG PO TABS: 650.0000 mg | ORAL_TABLET | Freq: Four times a day (QID) | ORAL | Status: DC | PRN

## 2015-11-09 MED ORDER — BISACODYL 5 MG PO TBEC: 5.0000 mg | DELAYED_RELEASE_TABLET | Freq: Every day | ORAL | Status: DC | PRN

## 2015-11-09 MED ORDER — ATORVASTATIN CALCIUM 10 MG PO TABS
10.0000 mg | ORAL_TABLET | Freq: Every day | ORAL | Status: DC
  Administered 2015-11-10 – 2015-11-11 (×2): 10 mg via ORAL
  Filled 2015-11-09 (×2): qty 1

## 2015-11-09 MED ORDER — SODIUM CHLORIDE 0.9 % IV SOLN
10.0000 mL/h | Freq: Once | INTRAVENOUS | Status: AC
  Administered 2015-11-10: 10 mL/h via INTRAVENOUS

## 2015-11-09 MED ORDER — DOCUSATE SODIUM 100 MG PO CAPS
100.0000 mg | ORAL_CAPSULE | Freq: Two times a day (BID) | ORAL | Status: DC
  Administered 2015-11-10 – 2015-11-11 (×3): 100 mg via ORAL
  Filled 2015-11-09 (×4): qty 1

## 2015-11-09 NOTE — ED Notes (Signed)
Pt to triage via wheelchair.  Pt reports she had lab drawn today and her hgb is low.  Pt reports dizziness.  Pt alert.

## 2015-11-09 NOTE — ED Provider Notes (Signed)
[facility] Emergency Department Provider Note   ____________________________________________  Time seen: Approximately 9 PM  I have reviewed the triage vital signs and the nursing notes.   HISTORY  Chief Complaint Abnormal Lab   HPI Amanda Soto is a 82 y.o. female on eliquis for atrial fibrillation was presenting to the emergency Department for symptomatically anemia. She says that over the past several months she has had increasing weakness as well as dizziness. Her family has also noticed that she has been held. She says that she has had lack stools. However, several months ago she sent a card for Hemoccult attacks which was negative. Denies any vomiting or nausea.   Past Medical History  Diagnosis Date  . CHF (congestive heart failure) ([place])   . Hypertension   . Hyperlipidemia   . Atrial fibrillation ([place])   . Hypothyroid   . Arthritis   . Depression   . Skin cancer   . Chickenpox   . Diverticulitis   . GERD (gastroesophageal reflux disease)   . Heart murmur     Patient Active Problem List   Diagnosis Date Noted  . Hypothyroidism 11/07/2015  . Lightheadedness 11/07/2015  . Elevated glucose 09/07/2015  . Exertional shortness of breath 08/25/2015  . Back pain 07/27/2015  . Abdominal discomfort 07/27/2015  . Chronic systolic heart failure ([place]) 11/09/2014  . Bradycardia 11/09/2014  . Atrial fibrillation ([place]) 11/09/2014  . HTN (hypertension) 11/09/2014    Past Surgical History  Procedure Laterality Date  . Cataract extraction    . Partial hysterectomy    . Appendectomy    . Tonsillectomy      Current Outpatient Rx  Name  Route  Sig  Dispense  Refill  . amiodarone (PACERONE) 400 MG tablet   Oral   Take 100 mg by mouth daily.          Marland Kitchen apixaban (ELIQUIS) 2.5 MG TABS tablet   Oral   Take 2.5 mg by mouth 2 (two) times daily.         Marland Kitchen atorvastatin (LIPITOR) 10 MG tablet   Oral   Take 10 mg by mouth daily.           . furosemide (LASIX) 20 MG tablet   Oral   Take 20 mg by mouth daily.         Marland Kitchen levothyroxine (SYNTHROID, LEVOTHROID) 50 MCG tablet   Oral   Take 75 mcg by mouth daily before breakfast.          . traMADol (ULTRAM-ER) 200 MG 24 hr tablet   Oral   Take 1 tablet (200 mg total) by mouth daily.   30 tablet   0     Allergies Hydrocodone and Ciprofloxacin  Family History  Problem Relation Age of Onset  . Stroke Maternal Grandmother   . Breast cancer Mother   . Breast cancer Sister   . Heart disease      Parent, grandparent, sons  . Hypertension      Parent, grandparent    Social History Social History  Substance Use Topics  . Smoking status: Never Smoker   . Smokeless tobacco: Never Used  . Alcohol Use: No     Comment: occasional alcohol    Review of Systems Constitutional: No fever/chills Eyes: No visual changes. ENT: No sore throat. Cardiovascular: Denies chest pain. Respiratory: Denies shortness of breath. Gastrointestinal: No abdominal pain.  No nausea, no vomiting.  No diarrhea.  No constipation. Genitourinary: Negative for  dysuria. Musculoskeletal: Negative for back pain. Skin: Negative for rash. Neurological: Negative for headaches, focal weakness or numbness.  10-point ROS otherwise negative.  ____________________________________________   PHYSICAL EXAM:  VITAL SIGNS: ED Triage Vitals  Enc Vitals Group     BP 11/09/15 1742 126/65 mmHg     Pulse Rate 11/09/15 1742 95     Resp 11/09/15 1742 20     Temp 11/09/15 1742 98 F (36.7 C)     Temp Source 11/09/15 1742 Oral     SpO2 11/09/15 1742 95 %     Weight 11/09/15 1742 123 lb (55.792 kg)     Height 11/09/15 1742 4\' 11"  (1.499 m)     Head Cir --      Peak Flow --      Pain Score --      Pain Loc --      Pain Edu? --      Excl. in [place]? --     Constitutional: Alert and oriented. Well appearing and in no acute distress. Eyes: Conjunctivae are pale. PERRL. EOMI. Head: Atraumatic. Nose: No  congestion/rhinnorhea. Mouth/Throat: Mucous membranes are moist.   Neck: No stridor.   Cardiovascular: Normal rate, regular rhythm. Grossly normal heart sounds.   Respiratory: Normal respiratory effort.  No retractions. Lungs CTAB. Gastrointestinal: Soft and nontender. No distention. No abdominal bruits. No CVA tenderness. Heme-negative brown stool on the rectal exam. Musculoskeletal: No lower extremity tenderness nor edema.  No joint effusions. Neurologic:  Normal speech and language. No gross focal neurologic deficits are appreciated.  Skin:  Skin is warm, dry and intact. No rash noted. Psychiatric: Mood and affect are normal. Speech and behavior are normal.  ____________________________________________   LABS (all labs ordered are listed, but only abnormal results are displayed)  Labs Reviewed  BASIC METABOLIC PANEL - Abnormal; Notable for the following:    Potassium 3.1 (*)    Glucose, Bld 139 (*)    Calcium 8.7 (*)    All other components within normal limits  CBC - Abnormal; Notable for the following:    RBC 3.05 (*)    Hemoglobin 7.5 (*)    HCT 24.0 (*)    MCV 78.8 (*)    MCH 24.6 (*)    MCHC 31.2 (*)    RDW 17.3 (*)    All other components within normal limits  URINALYSIS COMPLETEWITH MICROSCOPIC ([facility] ONLY) - Abnormal; Notable for the following:    Color, Urine YELLOW (*)    APPearance CLEAR (*)    Protein, ur 30 (*)    Leukocytes, UA 1+ (*)    Squamous Epithelial / LPF 0-5 (*)    All other components within normal limits  TROPONIN I  TYPE AND SCREEN  ABO/RH   ____________________________________________  EKG  ED ECG REPORT I, Doran Stabler, the attending physician, personally viewed and interpreted this ECG.   Date: 11/09/2015  EKG Time: 1759  Rate: 96  Rhythm: atrial fibrillation, rate 96  Axis: Normal axis  Intervals:left bundle branch block  ST&T Change: No ST segment elevation or depression. T-wave inversion in 1 as well as  aVL.  ____________________________________________  RADIOLOGY   ____________________________________________   PROCEDURES    ____________________________________________   INITIAL IMPRESSION / ASSESSMENT AND PLAN / ED COURSE  Pertinent labs & imaging results that were available during my care of the patient were reviewed by me and considered in my medical decision making (see chart for details).  Patient with symptomatic anemia but with  an unclear cause. She says she has been eating less lately. Possibly nutritionally related. Appears to be microcytic. Discussed the need for admission to the hospital as well as blood transfusion. The patient and family understand and are willing to comply. Signed out to Dr. Manuella Ghazi.   ____________________________________________   FINAL CLINICAL IMPRESSION(S) / ED DIAGNOSES  Symptomatic anemia.    NEW MEDICATIONS STARTED DURING THIS VISIT:  New Prescriptions   No medications on file     Note:  This document was prepared using Dragon voice recognition software and may include unintentional dictation errors.    Orbie Pyo, MD 11/09/15 [phone]

## 2015-11-09 NOTE — Telephone Encounter (Signed)
Spoke with patient and advised patient that she will need to go to the ED due to her hemoglobin being a 7.8 for further evaluation. Patient stated that she was still feeling very lightheaded. She was going to call a family member to come pick her up to take her.

## 2015-11-09 NOTE — Telephone Encounter (Signed)
See other phone note from today.

## 2015-11-09 NOTE — Telephone Encounter (Signed)
Critical lab value called in of hemoglobin of 7.8. Given down trending hemoglobin in patient with light headedness and cardiac disease it would be prudent to be evaluated in the ED for potential transfusion and possible GI work up for cause of bleeding. Will have CMA call the patient to inform of this.

## 2015-11-09 NOTE — H&P (Addendum)
Amanda Soto at [place] NAME: Amanda Soto    MR#:  [id number]  DATE OF BIRTH:  1925-03-21  DATE OF ADMISSION:  11/09/2015  PRIMARY CARE PHYSICIAN: [name], MD   REQUESTING/REFERRING PHYSICIAN: Orbie Pyo, MD  CHIEF COMPLAINT:   Chief Complaint  Patient presents with  . Abnormal Lab    HISTORY OF PRESENT ILLNESS:  Amanda Soto  is a 82 y.o. female with a known history of CHF, hypertension, atrial fibrillation on Eliquis was sent over to the emergency department by her primary care physician for symptomatic anemia. She says that over the past several months she has had increasing weakness as well as dizziness. Her family has also noticed that she has been pale. She says that she has had dark stools. However, several months ago she sent a card for Hemoccult attacks which was negative. Denies any vomiting or nausea.She also had stool Hemoccult negative in the emergency department. PAST MEDICAL HISTORY:   Past Medical History  Diagnosis Date  . CHF (congestive heart failure) ([place])   . Hypertension   . Hyperlipidemia   . Atrial fibrillation ([place])   . Hypothyroid   . Arthritis   . Depression   . Skin cancer   . Chickenpox   . Diverticulitis   . GERD (gastroesophageal reflux disease)   . Heart murmur     PAST SURGICAL HISTORY:   Past Surgical History  Procedure Laterality Date  . Cataract extraction    . Partial hysterectomy    . Appendectomy    . Tonsillectomy      SOCIAL HISTORY:   Social History  Substance Use Topics  . Smoking status: Never Smoker   . Smokeless tobacco: Never Used  . Alcohol Use: No     Comment: occasional alcohol    FAMILY HISTORY:   Family History  Problem Relation Age of Onset  . Stroke Maternal Grandmother   . Breast cancer Mother   . Breast cancer Sister   . Heart disease      Parent, grandparent, sons  . Hypertension      Parent, grandparent    DRUG ALLERGIES:    Allergies  Allergen Reactions  . Hydrocodone Other (See Comments)    "Funny feeling" "Funny feeling"  . Ciprofloxacin Nausea And Vomiting    REVIEW OF SYSTEMS:   Review of Systems  Constitutional: Positive for malaise/fatigue. Negative for fever, weight loss and diaphoresis.  HENT: Negative for ear discharge, ear pain, hearing loss, nosebleeds, sore throat and tinnitus.   Eyes: Negative for blurred vision and pain.  Respiratory: Negative for cough, hemoptysis, shortness of breath and wheezing.   Cardiovascular: Negative for chest pain, palpitations, orthopnea and leg swelling.  Gastrointestinal: Negative for heartburn, nausea, vomiting, abdominal pain, diarrhea, constipation and blood in stool.  Genitourinary: Negative for dysuria, urgency and frequency.  Musculoskeletal: Negative for myalgias and back pain.  Skin: Negative for itching and rash.  Neurological: Positive for dizziness and weakness. Negative for tingling, tremors, focal weakness, seizures and headaches.  Psychiatric/Behavioral: Negative for depression. The patient is not nervous/anxious.    MEDICATIONS AT HOME:   Prior to Admission medications   Medication Sig Start Date End Date Taking? Authorizing Provider  amiodarone (PACERONE) 400 MG tablet Take 100 mg by mouth daily.     Historical Provider, MD  apixaban (ELIQUIS) 2.5 MG TABS tablet Take 2.5 mg by mouth 2 (two) times daily.    Historical Provider, MD  atorvastatin (LIPITOR)  10 MG tablet Take 10 mg by mouth daily.    Historical Provider, MD  furosemide (LASIX) 20 MG tablet Take 20 mg by mouth daily.    Historical Provider, MD  levothyroxine (SYNTHROID, LEVOTHROID) 50 MCG tablet Take 75 mcg by mouth daily before breakfast.     Historical Provider, MD  traMADol (ULTRAM-ER) 200 MG 24 hr tablet Take 1 tablet (200 mg total) by mouth daily. 11/07/15   [name], MD      VITAL SIGNS:  Blood pressure 118/91, pulse 92, temperature 98 F (36.7 C), temperature  source Oral, resp. rate 19, height 4\' 11"  (1.499 m), weight 55.792 kg (123 lb), SpO2 98 %.  PHYSICAL EXAMINATION:  Physical Exam  Constitutional: She is oriented to person, place, and time and well-developed, well-nourished, and in no distress.  HENT:  Head: Normocephalic and atraumatic.  Eyes: Conjunctivae and EOM are normal. Pupils are equal, round, and reactive to light.  Neck: Normal range of motion. Neck supple. No tracheal deviation present. No thyromegaly present.  Cardiovascular: Normal rate, regular rhythm and normal heart sounds.   Pulmonary/Chest: Effort normal and breath sounds normal. No respiratory distress. She has no wheezes. She exhibits no tenderness.  Abdominal: Soft. Bowel sounds are normal. She exhibits no distension. There is no tenderness.  Musculoskeletal: Normal range of motion.  Neurological: She is alert and oriented to person, place, and time. No cranial nerve deficit.  Skin: Skin is warm and dry. No rash noted.  Psychiatric: Mood and affect normal.   LABORATORY PANEL:   CBC  Recent Labs Lab 11/09/15 1747  WBC 8.7  HGB 7.5*  HCT 24.0*  PLT 329   ------------------------------------------------------------------------------------------------------------------  Chemistries   Recent Labs Lab 11/09/15 1747  NA 135  K 3.1*  CL 102  CO2 22  GLUCOSE 139*  BUN 14  CREATININE 0.66  CALCIUM 8.7*   ------------------------------------------------------------------------------------------------------------------  Cardiac Enzymes  Recent Labs Lab 11/09/15 1747  TROPONINI <0.03   ------------------------------------------------------------------------------------------------------------------  RADIOLOGY:  No results found.  IMPRESSION AND PLAN:  82 year old female with known history of hypertension, CHF, atrial fibrillation on Eliquis is being admitted for  * Symptomatic anemia - Hemoglobin of 7.5  - Hold Eliquis - Stool for Hemoccult  negative While in the emergency department - 1 unit of packed red blood cells ordered by ED physician for transfusion - We will consult GI - Order anemia panel - She is scheduled to have outpatient GI follow-up with [facility] clinic next Wednesday  * Hypokalemia - Replete and recheck  * Atrial fibrillation - Rate is controlled.  We will place on off unit telemetry - Hold Eliquis, but continue amiodarone  * Chronic back pain - Has scheduled follow-up on coming Monday with her pain specialist - Continue tramadol , although she says it is not working well.  She normally takes extended-release tramadol at 200 mg once a day.  We will split into 50 mg every 6 hours for regular tramadol  * Advance care planning - Son and daughter-in-law at bedside, son is the decision maker and they would like to keep her full code for now   All the records are reviewed and case discussed with ED provider. Management plans discussed with the patient, family and they are in agreement.  CODE STATUS: Full code  TOTAL TIME TAKING CARE OF THIS PATIENT: 45 minutes.    [facility], Marcianne Ozbun M.D on 11/09/2015 at 10:07 PM  Between 7am to 6pm - Pager - [phone]  After 6pm go to  [email or web address] - password [email or web address] [place] [facility]  Office  [phone]  CC: Primary care physician; [name], MD   Note: This dictation was prepared with Dragon dictation along with smaller phrase technology. Any transcriptional errors that result from this process are unintentional.

## 2015-11-09 NOTE — ED Notes (Signed)
Pt in via triage; pt reports was called by PCP and advised to come to ER to be evaluated due to low Hemoglobin which has been trending down.  Pt reports being on eliquis for her Afibb, reports increasing dizziness, weakness x a few weeks.  Pt reports dark stools, but with negative hemacult test at PCP; appointment schedule with GI for next Wed.  Pt A/Ox4, vitals WDL, pale in color, no immediate distress at this time.

## 2015-11-09 NOTE — Telephone Encounter (Signed)
Noted  

## 2015-11-09 NOTE — Telephone Encounter (Signed)
Critical  Hemoglobin: 7.8  Hematocrit: 24.5

## 2015-11-10 DIAGNOSIS — D649 Anemia, unspecified: Secondary | ICD-10-CM

## 2015-11-10 DIAGNOSIS — E876 Hypokalemia: Secondary | ICD-10-CM | POA: Diagnosis not present

## 2015-11-10 DIAGNOSIS — M549 Dorsalgia, unspecified: Secondary | ICD-10-CM | POA: Diagnosis not present

## 2015-11-10 DIAGNOSIS — I4891 Unspecified atrial fibrillation: Secondary | ICD-10-CM | POA: Diagnosis not present

## 2015-11-10 HISTORY — DX: Anemia, unspecified: D64.9

## 2015-11-10 LAB — RETICULOCYTES
RBC.: 3.48 MIL/uL — AB (ref 3.80–5.20)
Retic Count, Absolute: 55.7 10*3/uL (ref 19.0–183.0)
Retic Ct Pct: 1.6 % (ref 0.4–3.1)

## 2015-11-10 LAB — BASIC METABOLIC PANEL
Anion gap: 7 (ref 5–15)
BUN: 14 mg/dL (ref 6–20)
CALCIUM: 8.4 mg/dL — AB (ref 8.9–10.3)
CO2: 26 mmol/L (ref 22–32)
CREATININE: 0.67 mg/dL (ref 0.44–1.00)
Chloride: 102 mmol/L (ref 101–111)
GFR calc Af Amer: >60 mL/min (ref >60)
GFR calc non Af Amer: >60 mL/min (ref >60)
GLUCOSE: 106 mg/dL — AB (ref 65–99)
Potassium: 3.6 mmol/L (ref 3.5–5.1)
Sodium: 135 mmol/L (ref 135–145)

## 2015-11-10 LAB — GLUCOSE, CAPILLARY: Glucose-Capillary: 103 mg/dL — ABNORMAL HIGH (ref 65–99)

## 2015-11-10 LAB — CBC
HCT: 28.1 % — ABNORMAL LOW (ref 35.0–47.0)
HEMOGLOBIN: 9.1 g/dL — AB (ref 12.0–16.0)
MCH: 25.8 pg — AB (ref 26.0–34.0)
MCHC: 32.3 g/dL (ref 32.0–36.0)
MCV: 79.8 fL — AB (ref 80.0–100.0)
Platelets: 303 10*3/uL (ref 150–440)
RBC: 3.52 MIL/uL — ABNORMAL LOW (ref 3.80–5.20)
RDW: 16.7 % — ABNORMAL HIGH (ref 11.5–14.5)
WBC: 7.7 10*3/uL (ref 3.6–11.0)

## 2015-11-10 LAB — FERRITIN: FERRITIN: 20 ng/mL (ref 11–307)

## 2015-11-10 LAB — MAGNESIUM: MAGNESIUM: 2.1 mg/dL (ref 1.7–2.4)

## 2015-11-10 LAB — IRON AND TIBC
IRON: 37 ug/dL (ref 28–170)
SATURATION RATIOS: 9 % — AB (ref 10.4–31.8)
TIBC: 400 ug/dL (ref 250–450)
UIBC: 363 ug/dL

## 2015-11-10 LAB — FOLATE: Folate: 18.9 ng/mL (ref >5.9)

## 2015-11-10 LAB — VITAMIN B12: VITAMIN B 12: 227 pg/mL (ref 180–914)

## 2015-11-10 NOTE — Care Management (Addendum)
Presents due to abnormal hemoglobin/hemotocrit results obtained during routine pcp visit.  She presents from home. She is currently followed by [facility] for physical therapy.  Agency informed of admission. Patient has an appointment with GI next week.  She is current with her pcp and independent in her adls.  Hemocult negative in ED. Has received one unit of packed cells.  GI consult has been ordered and is pending. Hgb this morning is 9.1.

## 2015-11-10 NOTE — Progress Notes (Signed)
Patient ID: Amanda Soto, female   DOB: July 10, 1924, 82 y.o.   MRN: [id number] [place] [facility] [place] [place] NAME: Amanda Soto    MR#:  [id number]  DATE OF BIRTH:  1925/01/25  SUBJECTIVE:   Patient has been admitted for low hemoglobin as noted on outpatient blood work. Hemoccult stool 2 negative. No active bleeding. Patient feels weak and fatigued. REVIEW OF SYSTEMS:   Review of Systems  Constitutional: Negative for fever, chills and weight loss.  HENT: Negative for ear discharge, ear pain and nosebleeds.   Eyes: Negative for blurred vision, pain and discharge.  Respiratory: Negative for sputum production, shortness of breath, wheezing and stridor.   Cardiovascular: Negative for chest pain, palpitations, orthopnea and PND.  Gastrointestinal: Negative for nausea, vomiting, abdominal pain and diarrhea.  Genitourinary: Negative for urgency and frequency.  Musculoskeletal: Negative for back pain and joint pain.  Neurological: Positive for weakness. Negative for sensory change, speech change and focal weakness.  Psychiatric/Behavioral: Negative for depression and hallucinations. The patient is not nervous/anxious.   All other systems reviewed and are negative.  Tolerating Diet:yes Tolerating PT: not needed  DRUG ALLERGIES:   Allergies  Allergen Reactions  . Hydrocodone Other (See Comments)    "Funny feeling"  . Ciprofloxacin Nausea And Vomiting    VITALS:  Blood pressure 136/62, pulse 99, temperature 98.2 F (36.8 C), temperature source Oral, resp. rate 20, height 4\' 11"  (1.499 m), weight 57.471 kg (126 lb 11.2 oz), SpO2 97 %.  PHYSICAL EXAMINATION:   Physical Exam  GENERAL:  82 y.o.-year-old patient lying in the bed with no acute distress.  EYES: Pupils equal, round, reactive to light and accommodation. No scleral icterus. Extraocular muscles intact.  HEENT: Head atraumatic, normocephalic. Oropharynx and nasopharynx clear.  NECK:   Supple, no jugular venous distention. No thyroid enlargement, no tenderness.  LUNGS: Normal breath sounds bilaterally, no wheezing, rales, rhonchi. No use of accessory muscles of respiration.  CARDIOVASCULAR: S1, S2 normal. No murmurs, rubs, or gallops.  ABDOMEN: Soft, nontender, nondistended. Bowel sounds present. No organomegaly or mass.  EXTREMITIES: No cyanosis, clubbing or edema b/l.    NEUROLOGIC: Cranial nerves II through XII are intact. No focal Motor or sensory deficits b/l.   PSYCHIATRIC:  patient is alert and oriented x 3.  SKIN: No obvious rash, lesion, or ulcer.   LABORATORY PANEL:  CBC  Recent Labs Lab 11/10/15 0436  WBC 7.7  HGB 9.1*  HCT 28.1*  PLT 303    Chemistries   Recent Labs Lab 11/10/15 0436  NA 135  K 3.6  CL 102  CO2 26  GLUCOSE 106*  BUN 14  CREATININE 0.67  CALCIUM 8.4*  MG 2.1   Cardiac Enzymes  Recent Labs Lab 11/09/15 1747  TROPONINI <0.03   RADIOLOGY:  No results found. ASSESSMENT AND PLAN:   82 year old female with known history of hypertension, CHF, atrial fibrillation on Eliquis is being admitted for  * Symptomatic anemia - Hemoglobin of 7.5 -- 1unit BT--9.2 - Hold Eliquis - Stool for Hemoccult negative While in the emergency department - We will consult GI (not on call today) - iron studies ok. Check B12 - She is scheduled to have outpatient GI follow-up with [facility] clinic next Wednesday  * Hypokalemia - Replete and recheck  * Atrial fibrillation - Rate is controlled. We will place on off unit telemetry - Hold Eliquis, but continue amiodarone  * Chronic back pain - Has scheduled follow-up on  coming Monday with her pain specialist - Continue tramadol , although she says it is not working well. She normally takes extended-release tramadol at 200 mg once a day. We will split into 50 mg every 6 hours for regular tramadol  * Advance care planning - Son and daughter-in-law at bedside, son is the decision maker and  they would like to keep her full code for now   Case discussed with Care Management/Social Worker. Management plans discussed with the patient, family and they are in agreement.  CODE STATUS: full  DVT Prophylaxis: TEDS TOTAL TIME TAKING CARE OF THIS PATIENT: 30 minutes.  >50% time spent on counselling and coordination of care  POSSIBLE D/C IN 1 DAYS, DEPENDING ON CLINICAL CONDITION.  Note: This dictation was prepared with Dragon dictation along with smaller phrase technology. Any transcriptional errors that result from this process are unintentional.  Chelsey Redondo M.D on 11/10/2015 at 2:03 PM  Between 7am to 6pm - Pager - [phone]  After 6pm go to [email or web address] - password [email or web address] [facility]  [facility]  Office  [phone]  CC: Primary care physician; [name], MD

## 2015-11-11 ENCOUNTER — Telehealth: Payer: Self-pay | Admitting: Family Medicine

## 2015-11-11 ENCOUNTER — Telehealth: Payer: Self-pay

## 2015-11-11 DIAGNOSIS — M549 Dorsalgia, unspecified: Secondary | ICD-10-CM | POA: Diagnosis not present

## 2015-11-11 DIAGNOSIS — I4891 Unspecified atrial fibrillation: Secondary | ICD-10-CM | POA: Diagnosis not present

## 2015-11-11 DIAGNOSIS — K649 Unspecified hemorrhoids: Secondary | ICD-10-CM | POA: Diagnosis not present

## 2015-11-11 DIAGNOSIS — E876 Hypokalemia: Secondary | ICD-10-CM | POA: Diagnosis not present

## 2015-11-11 DIAGNOSIS — D649 Anemia, unspecified: Secondary | ICD-10-CM | POA: Diagnosis not present

## 2015-11-11 DIAGNOSIS — Z7902 Long term (current) use of antithrombotics/antiplatelets: Secondary | ICD-10-CM | POA: Diagnosis not present

## 2015-11-11 LAB — TYPE AND SCREEN
ABO/RH(D): A POS
ANTIBODY SCREEN: NEGATIVE
UNIT DIVISION: 0
UNIT DIVISION: 0

## 2015-11-11 MED ORDER — POLYSACCHARIDE IRON COMPLEX 150 MG PO CAPS
150.0000 mg | ORAL_CAPSULE | Freq: Every day | ORAL | Status: DC
  Filled 2015-11-11: qty 1

## 2015-11-11 MED ORDER — POLYSACCHARIDE IRON COMPLEX 150 MG PO CAPS: 150.0000 mg | ORAL_CAPSULE | Freq: Every day | ORAL | Status: DC

## 2015-11-11 NOTE — Progress Notes (Signed)
Patient ID: Amanda Soto, female   DOB: [birth year], 82 y.o.   MRN: [id number] [place] [facility] [place] NAME: Nicolle Hussong    MR#:  [id number]  DATE OF BIRTH:  29-Apr-1925  SUBJECTIVE:   Patient has been admitted for low hemoglobin as noted on outpatient blood work. Hemoccult stool 2 negative. No active bleedingNo new complaints. Patient desires to go home. Awaiting GI consultation. REVIEW OF SYSTEMS:   Review of Systems  Constitutional: Negative for fever, chills and weight loss.  HENT: Negative for ear discharge, ear pain and nosebleeds.   Eyes: Negative for blurred vision, pain and discharge.  Respiratory: Negative for sputum production, shortness of breath, wheezing and stridor.   Cardiovascular: Negative for chest pain, palpitations, orthopnea and PND.  Gastrointestinal: Negative for nausea, vomiting, abdominal pain and diarrhea.  Genitourinary: Negative for urgency and frequency.  Musculoskeletal: Negative for back pain and joint pain.  Neurological: Positive for weakness. Negative for sensory change, speech change and focal weakness.  Psychiatric/Behavioral: Negative for depression and hallucinations. The patient is not nervous/anxious.   All other systems reviewed and are negative.  Tolerating Diet:yes Tolerating PT: not needed  DRUG ALLERGIES:   Allergies  Allergen Reactions  . Hydrocodone Other (See Comments)    "Funny feeling"  . Ciprofloxacin Nausea And Vomiting    VITALS:  Blood pressure 112/57, pulse 103, temperature 98.7 F (37.1 C), temperature source Oral, resp. rate 18, height 4\' 11"  (1.499 m), weight 58.832 kg (129 lb 11.2 oz), SpO2 99 %.  PHYSICAL EXAMINATION:   Physical Exam  GENERAL:  82 y.o.-year-old patient lying in the bed with no acute distress.  EYES: Pupils equal, round, reactive to light and accommodation. No scleral icterus. Extraocular muscles intact.  HEENT: Head atraumatic, normocephalic.  Oropharynx and nasopharynx clear.  NECK:  Supple, no jugular venous distention. No thyroid enlargement, no tenderness.  LUNGS: Normal breath sounds bilaterally, no wheezing, rales, rhonchi. No use of accessory muscles of respiration.  CARDIOVASCULAR: S1, S2 normal. No murmurs, rubs, or gallops.  ABDOMEN: Soft, nontender, nondistended. Bowel sounds present. No organomegaly or mass.  EXTREMITIES: No cyanosis, clubbing or edema b/l.    NEUROLOGIC: Cranial nerves II through XII are intact. No focal Motor or sensory deficits b/l.   PSYCHIATRIC:  patient is alert and oriented x 3.  SKIN: No obvious rash, lesion, or ulcer.   LABORATORY PANEL:  CBC  Recent Labs Lab 11/10/15 0436  WBC 7.7  HGB 9.1*  HCT 28.1*  PLT 303    Chemistries   Recent Labs Lab 11/10/15 0436  NA 135  K 3.6  CL 102  CO2 26  GLUCOSE 106*  BUN 14  CREATININE 0.67  CALCIUM 8.4*  MG 2.1   Cardiac Enzymes  Recent Labs Lab 11/09/15 1747  TROPONINI <0.03   RADIOLOGY:  No results found. ASSESSMENT AND PLAN:   82 year old female with known history of hypertension, CHF, atrial fibrillation on Eliquis is being admitted for  * Symptomatic anemia - Hemoglobin of 7.5 -- 1unit BT--9.2 - Hold Eliquis - Stool for Hemoccult negative While in the emergency department - We will consult GI (not on call today) - iron studies ok. Check B12 - She is scheduled to have outpatient GI follow-up with [place] clinic next Wednesday -spoke with Dr Candace Cruise to see pt today  * Hypokalemia - Repleted  * Atrial fibrillation - Rate is controlled.  - Hold Eliquis, but continue amiodarone  * Chronic back pain -  Has scheduled follow-up on coming Monday with her pain specialist - Continue tramadol , although she says it is not working well. She normally takes extended-release tramadol at 200 mg once a day. We will split into 50 mg every 6 hours for regular tramadol  If GI plans to do w/u as out pt then will d/c pt  home  Case discussed with Care Management/Social Worker. Management plans discussed with the patient, family and they are in agreement.  CODE STATUS: full  DVT Prophylaxis: TEDS TOTAL TIME TAKING CARE OF THIS PATIENT: 30 minutes.  >50% time spent on counselling and coordination of care  POSSIBLE D/C IN 1 DAYS, DEPENDING ON CLINICAL CONDITION.  Note: This dictation was prepared with Dragon dictation along with smaller phrase technology. Any transcriptional errors that result from this process are unintentional.  Michael Ventresca M.D on 11/11/2015 at 11:49 AM  Between 7am to 6pm - Pager - [phone]  After 6pm go to [email or web address] - password [email or web address] [facility]  [facility]  Office  [phone]  CC: Primary care physician; [name], MD

## 2015-11-11 NOTE — Consult Note (Signed)
GI Inpatient Consult Note  Reason for Consult: Anemia, symptomatic    Attending Requesting Consult: Dr. Posey Pronto  History of Present Illness: Amanda Soto is a 82 y.o. female seen for evaluation of symptomatic anemia at the request of Dr. Posey Pronto.   She was last seen in [facility] GI clinic for fecal incontinence in 07/2015. At that time had a BioIQ test positive for occult blood, given her advanced age and Hgb 12.5 in 05/2015 was not reccommended to proceed w/ colonoscopy.  Referred to pelvic floor therapy.   She reports that her fecal incontinence resolved w/ pelvic floor therapy. She denies any current issues. She is currently having a BM about q 1-3 days. She reports stools are soft-firm, usually dark-black in color. She denies any BRB in stoools. She denies any abdominal pain, diarrhea.   She denies nausea, vomiting, epigastric pain, dysphagia, GERD. She denies NSAID use. She is on Eliquis daily. No prior issues w/ anemia requiring blood transfusion. She presented to ED for weakness due to anemia, she has very occasional SOB. Reports a good appetite.    Last Colonoscopy: Colonoscopy October 2004-1 5 mm tubular adenoma in transverse colon, one 5 mm tubular adenoma in sigmoid colon at 30 cm, one 5 mm tubular adenoma at 25 cm  Last Endoscopy: None prior.    Past Medical History:  Past Medical History  Diagnosis Date  . CHF (congestive heart failure) ([place])   . Hypertension   . Hyperlipidemia   . Atrial fibrillation ([place])   . Hypothyroid   . Arthritis   . Depression   . Skin cancer   . Chickenpox   . Diverticulitis   . GERD (gastroesophageal reflux disease)   . Heart murmur     Problem List: Patient Active Problem List   Diagnosis Date Noted  . Anemia 11/10/2015  . Symptomatic anemia 11/09/2015  . Hypothyroidism 11/07/2015  . Lightheadedness 11/07/2015  . Elevated glucose 09/07/2015  . Exertional shortness of breath 08/25/2015  . Back pain 07/27/2015  . Abdominal discomfort  07/27/2015  . Chronic systolic heart failure ([place]) 11/09/2014  . Bradycardia 11/09/2014  . Atrial fibrillation ([place]) 11/09/2014  . HTN (hypertension) 11/09/2014    Past Surgical History: Past Surgical History  Procedure Laterality Date  . Cataract extraction    . Partial hysterectomy    . Appendectomy    . Tonsillectomy      Allergies: Allergies  Allergen Reactions  . Hydrocodone Other (See Comments)    "Funny feeling"  . Ciprofloxacin Nausea And Vomiting    Home Medications: Prescriptions prior to admission  Medication Sig Dispense Refill Last Dose  . acetaminophen (TYLENOL) 325 MG tablet Take 650 mg by mouth every 6 (six) hours as needed for moderate pain, fever or headache.   prn at prn  . amiodarone (PACERONE) 100 MG tablet Take 100 mg by mouth daily.   11/09/2015 at Unknown time  . apixaban (ELIQUIS) 2.5 MG TABS tablet Take 2.5 mg by mouth 2 (two) times daily.   11/09/2015 at Unknown time  . atorvastatin (LIPITOR) 10 MG tablet Take 10 mg by mouth daily.   11/09/2015 at Unknown time  . SYNTHROID 75 MCG tablet Take 75 mcg by mouth daily.  11 11/09/2015 at Unknown time  . traMADol (ULTRAM-ER) 200 MG 24 hr tablet Take 1 tablet (200 mg total) by mouth daily. 30 tablet 0 11/09/2015 at Unknown time   Home medication reconciliation was completed with the patient.   Scheduled Inpatient Medications:   .  amiodarone  100 mg Oral Daily  . atorvastatin  10 mg Oral Daily  . docusate sodium  100 mg Oral BID  . furosemide  20 mg Oral Daily  . levothyroxine  75 mcg Oral QAC breakfast  . sodium chloride flush  3 mL Intravenous Q12H  . traMADol  50 mg Oral Q6H    Continuous Inpatient Infusions:     PRN Inpatient Medications:  acetaminophen **OR** acetaminophen, bisacodyl, ondansetron **OR** ondansetron (ZOFRAN) IV, traZODone  Family History: family history includes Breast cancer in her mother and sister; Stroke in her maternal grandmother.  Denies family hx of colon polyps or CRC.    Social History:   reports that she has never smoked. She has never used smokeless tobacco. She reports that she does not drink alcohol or use illicit drugs.    Review of Systems: Constitutional: Weight is stable.  Eyes: No changes in vision. ENT: No oral lesions, sore throat.  GI: see HPI.  Heme/Lymph: No easy bruising.  CV: No chest pain.  GU: No hematuria.  Integumentary: No rashes.  Neuro: No headaches.  Psych: No depression/anxiety.  Endocrine: No heat/cold intolerance.  Allergic/Immunologic: No urticaria.  Resp: + SOB.  Musculoskeletal: No joint swelling.    Physical Examination: BP 112/57 mmHg  Pulse 103  Temp(Src) 98.7 F (37.1 C) (Oral)  Resp 18  Ht 4\' 11"  (1.499 m)  Wt 129 lb 11.2 oz (58.832 kg)  BMI 26.18 kg/m2  SpO2 99%  Gen: NAD, alert and oriented x 4. Stands w/ assistance.  HEENT: PEERLA, EOMI, Neck: supple, no JVD or thyromegaly Chest: CTA bilaterally, no wheezes, crackles, or other adventitious sounds CV: RRR, no m/g/c/r Abd: soft, NT, ND, +BS in all four quadrants; no HSM, guarding, ridigity, or rebound tenderness Rectal-no stool in rectal vault, mucous is heme negative.  Ext: no edema, well perfused with 2+ pulses, Skin: no rash or lesions noted Lymph: no LAD  Data: Lab Results  Component Value Date   WBC 7.7 11/10/2015   HGB 9.1* 11/10/2015   HCT 28.1* 11/10/2015   MCV 79.8* 11/10/2015   PLT 303 11/10/2015    Recent Labs Lab 11/09/15 1410 11/09/15 1747 11/10/15 0436  HGB 7.8 Repeated and verified X2.* 7.5* 9.1*   Lab Results  Component Value Date   NA 135 11/10/2015   K 3.6 11/10/2015   CL 102 11/10/2015   CO2 26 11/10/2015   BUN 14 11/10/2015   CREATININE 0.67 11/10/2015   Lab Results  Component Value Date   ALT 14 08/25/2015   AST 25 08/25/2015   ALKPHOS 55 08/25/2015   BILITOT 1.0 08/25/2015   No results for input(s): APTT, INR, PTT in the last 168 hours. Assessment/Plan: [name] is a 82 y.o. female admitted for  weakness.    1. Anemia - Hgb steadily dropping since 08/2015 (11.3 at that time)--10.8--7.8 on admission. Occult blood negative x 2. Iron deficient w/ ferritin 20. Normal Folate & B12. Microcytic. Improved to 9.1 after transfusion. Given her advanced age and atrial fib (on Eliquis as outpatient) would be hesitant for endoscopic procedures unless continues to require transfusions. Needs iron supplement at discharge.     Recommendations: 1. Begin iron supplement 2. Restarting eliquis per primary team/cardiology 3. Monitor H/h (reccomend office follow up within next 1-3 weeks)   Case discussed w/ Dr. Candace Cruise   Thank you for the consult. Please call with questions or concerns.  Ronney Asters, PA-C [place]

## 2015-11-11 NOTE — Telephone Encounter (Signed)
Unable to reach patient.  Attempt to follow up with transitional care management.  Appointment scheduled with PCP this upcoming Monday via discharge.  Will continue to follow as appropriate.

## 2015-11-11 NOTE — Discharge Instructions (Signed)
Blood Transfusion, Care After Refer to this sheet in the next few weeks. These instructions provide you with information about caring for yourself after your procedure. Your health care provider may also give you more specific instructions. Your treatment has been planned according to current medical practices, but problems sometimes occur. Call your health care provider if you have any problems or questions after your procedure. WHAT TO EXPECT AFTER THE PROCEDURE After your procedure, it is common to have:  Bruising and soreness at the IV site.  Chills or fever.  Headache. HOME CARE INSTRUCTIONS  Take medicines only as directed by your health care provider. Ask your health care provider if you can take an over-the-counter pain reliever in case you have a fever or headache a day or two after your transfusion.  Return to your normal activities as directed by your health care provider. SEEK MEDICAL CARE IF:   You develop redness or irritation at your IV site.  You have persistent fever, chills, or headache.  Your urine is darker than normal.  Your urine turns pink, red, or brown.   The white part of your eye turns yellow (jaundice).   You feel weak after doing your normal activities.  SEEK IMMEDIATE MEDICAL CARE IF:   You have trouble breathing.  You have fever and chills along with:  Anxiety.  Chest or back pain.  Flushed skin.  Clammy skin.  A rapid heartbeat.  Nausea.   This information is not intended to replace advice given to you by your health care provider. Make sure you discuss any questions you have with your health care provider.   Document Released: 07/09/2014 Document Reviewed: 07/09/2014 Elsevier Interactive Patient Education 2016 [place].  Blood Transfusion, Care After Refer to this sheet in the next few weeks. These instructions provide you with information about caring for yourself after your procedure. Your health care provider may also  give you more specific instructions. Your treatment has been planned according to current medical practices, but problems sometimes occur. Call your health care provider if you have any problems or questions after your procedure. WHAT TO EXPECT AFTER THE PROCEDURE After your procedure, it is common to have:  Bruising and soreness at the IV site.  Chills or fever.  Headache. HOME CARE INSTRUCTIONS  Take medicines only as directed by your health care provider. Ask your health care provider if you can take an over-the-counter pain reliever in case you have a fever or headache a day or two after your transfusion.  Return to your normal activities as directed by your health care provider. SEEK MEDICAL CARE IF:   You develop redness or irritation at your IV site.  You have persistent fever, chills, or headache.  Your urine is darker than normal.  Your urine turns pink, red, or brown.   The white part of your eye turns yellow (jaundice).   You feel weak after doing your normal activities.  SEEK IMMEDIATE MEDICAL CARE IF:   You have trouble breathing.  You have fever and chills along with:  Anxiety.  Chest or back pain.  Flushed skin.  Clammy skin.  A rapid heartbeat.  Nausea.   This information is not intended to replace advice given to you by your health care provider. Make sure you discuss any questions you have with your health care provider.   Document Released: 07/09/2014 Document Reviewed: 07/09/2014 Elsevier Interactive Patient Education [employer].

## 2015-11-11 NOTE — Discharge Summary (Signed)
[facility] [place] NAME: Amanda Soto    MR#:  [id number]  DATE OF BIRTH:  23-Aug-1924  DATE OF ADMISSION:  11/09/2015 ADMITTING PHYSICIAN: Max Sane, MD  DATE OF DISCHARGE: 11/11/2015  PRIMARY CARE PHYSICIAN: [name], MD    ADMISSION DIAGNOSIS:  Symptomatic anemia [D64.9]  DISCHARGE DIAGNOSIS:  Anemia-microcytic status post 1 unit of blood transfusion Atrial fibrillation on chronic anticoagulation  SECONDARY DIAGNOSIS:   Past Medical History  Diagnosis Date  . CHF (congestive heart failure) (Amanda Soto)   . Hypertension   . Hyperlipidemia   . Atrial fibrillation (Amanda Soto)   . Hypothyroid   . Arthritis   . Depression   . Skin cancer   . Chickenpox   . Diverticulitis   . GERD (gastroesophageal reflux disease)   . Heart murmur     HOSPITAL COURSE:   82 year old female with known history of hypertension, CHF, atrial fibrillation on Eliquis is being admitted for  * Symptomatic anemia - Hemoglobin of 7.5 -- 1unit BT--9.2 - Stool for Hemoccult negative While in the emergency department - Spoke with Dr. Candace Cruise GI and recommendations are to continue IM peeled and follow up with GI as outpatient. No indication for urgent for luminal evaluation. Patient has not shown any signs symptoms of GI bleeding. - iron studies ok. Folate and B12 within normal limit  - She is scheduled to have outpatient GI follow-up with [facility] next Wednesday -Spoke with patient and family Amanda Soto regarding a low plans. We'll resume eliquis for now. Patient understand she is still at risk of GI bleeding given being on blood thinner which is been taking for a while. -She wants to discuss with her cardiologist if she can stop it. We'll defer to cardiology as outpatient.  * Hypokalemia - Repleted  * Atrial fibrillation - Rate is controlled.  - resumed Eliquis and continue amiodarone  * Chronic back pain - Has scheduled follow-up on coming  Monday with her pain specialist - Continue tramadol , although she says it is not working well. She normally takes extended-release tramadol at 200 mg once a day. We will split into 50 mg every 6 hours for regular tramadol  Since no plans for GI workup further discussed with patient and family and they are agreeable for patient to go home. CONSULTS OBTAINED:  Treatment Team:  Hulen Luster, MD  DRUG ALLERGIES:   Allergies  Allergen Reactions  . Hydrocodone Other (See Comments)    "Funny feeling"  . Ciprofloxacin Nausea And Vomiting    DISCHARGE MEDICATIONS:   Current Discharge Medication List    START taking these medications   Details  iron polysaccharides (NIFEREX) 150 MG capsule Take 1 capsule (150 mg total) by mouth daily. Qty: 30 capsule, Refills: 2      CONTINUE these medications which have NOT CHANGED   Details  acetaminophen (TYLENOL) 325 MG tablet Take 650 mg by mouth every 6 (six) hours as needed for moderate pain, fever or headache.    amiodarone (PACERONE) 100 MG tablet Take 100 mg by mouth daily.    apixaban (ELIQUIS) 2.5 MG TABS tablet Take 2.5 mg by mouth 2 (two) times daily.    atorvastatin (LIPITOR) 10 MG tablet Take 10 mg by mouth daily.    SYNTHROID 75 MCG tablet Take 75 mcg by mouth daily. Refills: 11    traMADol (ULTRAM-ER) 200 MG 24 hr tablet Take 1 tablet (200 mg total) by mouth daily. Qty: 30 tablet,  Refills: 0      STOP taking these medications     furosemide (LASIX) 20 MG tablet         If you experience worsening of your admission symptoms, develop shortness of breath, life threatening emergency, suicidal or homicidal thoughts you must seek medical attention immediately by calling 911 or calling your MD immediately  if symptoms less severe.  You Must read complete instructions/literature along with all the possible adverse reactions/side effects for all the Medicines you take and that have been prescribed to you. Take any new Medicines  after you have completely understood and accept all the possible adverse reactions/side effects.   Please note  You were cared for by a hospitalist during your hospital stay. If you have any questions about your discharge medications or the care you received while you were in the hospital after you are discharged, you can call the unit and asked to speak with the hospitalist on call if the hospitalist that took care of you is not available. Once you are discharged, your primary care physician will handle any further medical issues. Please note that NO REFILLS for any discharge medications will be authorized once you are discharged, as it is imperative that you return to your primary care physician (or establish a relationship with a primary care physician if you do not have one) for your aftercare needs so that they can reassess your need for medications and monitor your lab values.   CBC   Recent Labs Lab 11/10/15 0436  WBC 7.7  HGB 9.1*  HCT 28.1*  PLT 303    Chemistries   Recent Labs Lab 11/10/15 0436  NA 135  K 3.6  CL 102  CO2 26  GLUCOSE 106*  BUN 14  CREATININE 0.67  CALCIUM 8.4*  MG 2.1    Microbiology Results   No results found for this or any previous visit (from the past 240 hour(s)).  RADIOLOGY:  No results found.   Management plans discussed with the patient, family and they are in agreement.  CODE STATUS:     Code Status Orders        Start     Ordered   11/09/15 2323  Full code   Continuous     11/09/15 2322    Code Status History    Date Active Date Inactive Code Status Order ID Comments User Context   This patient has a current code status but no historical code status.    Advance Directive Documentation        Most Recent Value   Type of Advance Directive  Healthcare Power of Attorney, Living will   Pre-existing out of facility DNR order (yellow form or pink MOST form)     "MOST" Form in Place?        TOTAL TIME TAKING CARE OF THIS  PATIENT: 40 minutes.    [name] M.D on 11/11/2015 at 2:29 PM  Between 7am to 6pm - Pager - [phone] After 6pm go to [email or web address] - password [email or web address] [facility]  [facility]  Office  [phone]  CC: Primary care physician; [name], MD

## 2015-11-11 NOTE — Consult Note (Signed)
  Pt seen and examined. Please see Rushie Chestnut' notes. Symptomatic anemia on eliquis. Heme neg. Hx of colon polyps in the past. Start Fe supplements. Pt has f/u with [place] next week in [place]. Will repeat CBC then. Would hold off on colonoscopy at this time due to her age. However, if hgb keeps falling on eliquis, may have to consider colonoscopy later. Will sign off. Thanks.

## 2015-11-11 NOTE — Telephone Encounter (Signed)
Hospital follow up discharged today 5.12.17

## 2015-11-11 NOTE — Care Management Obs Status (Signed)
[place] NOTIFICATION   Patient Details  Name: Amanda Soto MRN: [id number] Date of Birth: Nov 10, 1924   Medicare Observation Status Notification Given:  Yes Code [address], RN 11/11/2015, 9:04 AM

## 2015-11-11 NOTE — Telephone Encounter (Signed)
Thank you.  Will continue to follow as appropriate.

## 2015-11-11 NOTE — Progress Notes (Signed)
MD making rounds. Discharge orders received. IV discontinued. Prescriptions E-Scribed to pharmacy. Provided with Education Handouts. Discharge paperwork provided, explained, signed and witnessed. No unanswered questions. Discharged via wheelchair by Nursing Staff. Belongings sent with patient and family.

## 2015-11-11 NOTE — Care Management Note (Signed)
Case Management Note  Patient Details  Name: BYANCA KASPER MRN: [id number] Date of Birth: 03/12/1925  Subjective/Objective:                    Action/Plan: Met with patient to deliver and explain OBServation letter left with patient. She states that she is followed by [facility] on [address] She states she owns a walker but does not use it to ambulate. She denies RNCM needs.   Expected Discharge Date:                  Expected Discharge Plan:     In-House Referral:     Discharge planning Services  CM Consult  Post Acute Care Choice:    Choice offered to:  Patient  DME Arranged:    DME Agency:     HH Arranged:    [place] Agency:     Status of Service:  In process, will continue to follow  Medicare Important Message Given:    Date Medicare IM Given:    Medicare IM give by:    Date Additional Medicare IM Given:    Additional Medicare Important Message give by:     If discussed at [place] of Stay Meetings, dates discussed:    Additional Comments:  Marshell Garfinkel, RN 11/11/2015, 11:59 AM

## 2015-11-14 ENCOUNTER — Ambulatory Visit (INDEPENDENT_AMBULATORY_CARE_PROVIDER_SITE_OTHER): Payer: Medicare Other | Admitting: Family Medicine

## 2015-11-14 ENCOUNTER — Ambulatory Visit
Admission: RE | Admit: 2015-11-14 | Discharge: 2015-11-14 | Disposition: A | Discharge status: Home / Self Care | Payer: Medicare Other | Source: Ambulatory Visit | Attending: Pain Medicine | Admitting: Pain Medicine

## 2015-11-14 ENCOUNTER — Encounter: Payer: Self-pay | Admitting: Family Medicine

## 2015-11-14 ENCOUNTER — Ambulatory Visit (HOSPITAL_BASED_OUTPATIENT_CLINIC_OR_DEPARTMENT_OTHER): Payer: Medicare Other | Admitting: Pain Medicine

## 2015-11-14 ENCOUNTER — Encounter: Payer: Self-pay | Admitting: Pain Medicine

## 2015-11-14 VITALS — BP 104/86 | HR 95 | Temp 98.4°F | Resp 16 | Ht 59.0 in | Wt 127.0 lb

## 2015-11-14 VITALS — BP 108/76 | HR 99 | Temp 98.0°F | Wt 127.6 lb

## 2015-11-14 DIAGNOSIS — Q762 Congenital spondylolisthesis: Secondary | ICD-10-CM | POA: Diagnosis not present

## 2015-11-14 DIAGNOSIS — I700 Atherosclerosis of aorta: Secondary | ICD-10-CM | POA: Diagnosis not present

## 2015-11-14 DIAGNOSIS — M810 Age-related osteoporosis without current pathological fracture: Secondary | ICD-10-CM | POA: Insufficient documentation

## 2015-11-14 DIAGNOSIS — M4310 Spondylolisthesis, site unspecified: Secondary | ICD-10-CM

## 2015-11-14 DIAGNOSIS — G8929 Other chronic pain: Secondary | ICD-10-CM | POA: Diagnosis not present

## 2015-11-14 DIAGNOSIS — M5124 Other intervertebral disc displacement, thoracic region: Secondary | ICD-10-CM

## 2015-11-14 DIAGNOSIS — M545 Low back pain, unspecified: Secondary | ICD-10-CM

## 2015-11-14 DIAGNOSIS — M8588 Other specified disorders of bone density and structure, other site: Secondary | ICD-10-CM | POA: Diagnosis not present

## 2015-11-14 DIAGNOSIS — R42 Dizziness and giddiness: Secondary | ICD-10-CM

## 2015-11-14 DIAGNOSIS — I482 Chronic atrial fibrillation, unspecified: Secondary | ICD-10-CM

## 2015-11-14 DIAGNOSIS — E785 Hyperlipidemia, unspecified: Secondary | ICD-10-CM | POA: Insufficient documentation

## 2015-11-14 DIAGNOSIS — M4316 Spondylolisthesis, lumbar region: Secondary | ICD-10-CM | POA: Diagnosis not present

## 2015-11-14 DIAGNOSIS — M5134 Other intervertebral disc degeneration, thoracic region: Secondary | ICD-10-CM

## 2015-11-14 DIAGNOSIS — M419 Scoliosis, unspecified: Secondary | ICD-10-CM

## 2015-11-14 DIAGNOSIS — D649 Anemia, unspecified: Secondary | ICD-10-CM

## 2015-11-14 DIAGNOSIS — E039 Hypothyroidism, unspecified: Secondary | ICD-10-CM | POA: Insufficient documentation

## 2015-11-14 DIAGNOSIS — M1611 Unilateral primary osteoarthritis, right hip: Secondary | ICD-10-CM

## 2015-11-14 DIAGNOSIS — M47816 Spondylosis without myelopathy or radiculopathy, lumbar region: Secondary | ICD-10-CM | POA: Diagnosis not present

## 2015-11-14 DIAGNOSIS — M4126 Other idiopathic scoliosis, lumbar region: Secondary | ICD-10-CM

## 2015-11-14 DIAGNOSIS — R011 Cardiac murmur, unspecified: Secondary | ICD-10-CM | POA: Insufficient documentation

## 2015-11-14 DIAGNOSIS — I340 Nonrheumatic mitral (valve) insufficiency: Secondary | ICD-10-CM | POA: Insufficient documentation

## 2015-11-14 LAB — CBC
HCT: 29.2 % — ABNORMAL LOW (ref 36.0–46.0)
HEMOGLOBIN: 9.2 g/dL — AB (ref 12.0–15.0)
MCHC: 31.7 g/dL (ref 30.0–36.0)
MCV: 80.2 fl (ref 78.0–100.0)
Platelets: 375 10*3/uL (ref 150.0–400.0)
RBC: 3.64 Mil/uL — ABNORMAL LOW (ref 3.87–5.11)
RDW: 18 % — ABNORMAL HIGH (ref 11.5–15.5)
WBC: 9.4 10*3/uL (ref 4.0–10.5)

## 2015-11-14 NOTE — Assessment & Plan Note (Signed)
Rate controlled today. Asymptomatic today. She will be set up a follow-up appointment with her cardiologist to discuss her anticoagulation given her recent anemia. She's given return precautions.

## 2015-11-14 NOTE — Assessment & Plan Note (Signed)
Symptomatically improved. Minimal lightheadedness. Minimal weakness. Minimal getting winded at start of exertion though this improves with exertion. Suspect this was the cause of her lightheadedness last week. We will recheck a CBC today. We'll arrange follow-up with her cardiologist to discuss her eliquis. She will keep her follow-up with GI. She will resume her Lasix. She'll continue to monitor. She is given return precautions.

## 2015-11-14 NOTE — Telephone Encounter (Signed)
Patient scheduled for 11/14/15 at 11:30

## 2015-11-14 NOTE — Patient Instructions (Signed)
GENERAL RISKS AND COMPLICATIONS  What are the risk, side effects and possible complications? Generally speaking, most procedures are safe.  However, with any procedure there are risks, side effects, and the possibility of complications.  The risks and complications are dependent upon the sites that are lesioned, or the type of nerve block to be performed.  The closer the procedure is to the spine, the more serious the risks are.  Great care is taken when placing the radio frequency needles, block needles or lesioning probes, but sometimes complications can occur. 1. Infection: Any time there is an injection through the skin, there is a risk of infection.  This is why sterile conditions are used for these blocks.  There are four possible types of infection. 1. Localized skin infection. 2. Central Nervous System Infection-This can be in the form of Meningitis, which can be deadly. 3. Epidural Infections-This can be in the form of an epidural abscess, which can cause pressure inside of the spine, causing compression of the spinal cord with subsequent paralysis. This would require an emergency surgery to decompress, and there are no guarantees that the patient would recover from the paralysis. 4. Discitis-This is an infection of the intervertebral discs.  It occurs in about 1% of discography procedures.  It is difficult to treat and it may lead to surgery.        2. Pain: the needles have to go through skin and soft tissues, will cause soreness.       3. Damage to internal structures:  The nerves to be lesioned may be near blood vessels or    other nerves which can be potentially damaged.       4. Bleeding: Bleeding is more common if the patient is taking blood thinners such as  aspirin, Coumadin, Ticiid, Plavix, etc., or if he/she have some genetic predisposition  such as hemophilia. Bleeding into the spinal canal can cause compression of the spinal  cord with subsequent paralysis.  This would require an  emergency surgery to  decompress and there are no guarantees that the patient would recover from the  paralysis.       5. Pneumothorax:  Puncturing of a lung is a possibility, every time a needle is introduced in  the area of the chest or upper back.  Pneumothorax refers to free air around the  collapsed lung(s), inside of the thoracic cavity (chest cavity).  Another two possible  complications related to a similar event would include: Hemothorax and Chylothorax.   These are variations of the Pneumothorax, where instead of air around the collapsed  lung(s), you may have blood or chyle, respectively.       6. Spinal headaches: They may occur with any procedures in the area of the spine.       7. Persistent CSF (Cerebro-Spinal Fluid) leakage: This is a rare problem, but may occur  with prolonged intrathecal or epidural catheters either due to the formation of a fistulous  track or a dural tear.       8. Nerve damage: By working so close to the spinal cord, there is always a possibility of  nerve damage, which could be as serious as a permanent spinal cord injury with  paralysis.       9. Death:  Although rare, severe deadly allergic reactions known as "Anaphylactic  reaction" can occur to any of the medications used.      10. Worsening of the symptoms:  We can always make thing worse.    What are the chances of something like this happening? Chances of any of this occuring are extremely low.  By statistics, you have more of a chance of getting killed in a motor vehicle accident: while driving to the hospital than any of the above occurring .  Nevertheless, you should be aware that they are possibilities.  In general, it is similar to taking a shower.  Everybody knows that you can slip, hit your head and get killed.  Does that mean that you should not shower again?  Nevertheless always keep in mind that statistics do not mean anything if you happen to be on the wrong side of them.  Even if a procedure has a 1  (one) in a 1,000,000 (million) chance of going wrong, it you happen to be that one..Also, keep in mind that by statistics, you have more of a chance of having something go wrong when taking medications.  Who should not have this procedure? If you are on a blood thinning medication (e.g. Coumadin, Plavix, see list of "Blood Thinners"), or if you have an active infection going on, you should not have the procedure.  If you are taking any blood thinners, please inform your physician.  How should I prepare for this procedure?  Do not eat or drink anything at least six hours prior to the procedure.  Bring a driver with you .  It cannot be a taxi.  Come accompanied by an adult that can drive you back, and that is strong enough to help you if your legs get weak or numb from the local anesthetic.  Take all of your medicines the morning of the procedure with just enough water to swallow them.  If you have diabetes, make sure that you are scheduled to have your procedure done first thing in the morning, whenever possible.  If you have diabetes, take only half of your insulin dose and notify our nurse that you have done so as soon as you arrive at the clinic.  If you are diabetic, but only take blood sugar pills (oral hypoglycemic), then do not take them on the morning of your procedure.  You may take them after you have had the procedure.  Do not take aspirin or any aspirin-containing medications, at least eleven (11) days prior to the procedure.  They may prolong bleeding.  Wear loose fitting clothing that may be easy to take off and that you would not mind if it got stained with Betadine or blood.  Do not wear any jewelry or perfume  Remove any nail coloring.  It will interfere with some of our monitoring equipment.  NOTE: Remember that this is not meant to be interpreted as a complete list of all possible complications.  Unforeseen problems may occur.  BLOOD THINNERS The following drugs  contain aspirin or other products, which can cause increased bleeding during surgery and should not be taken for 2 weeks prior to and 1 week after surgery.  If you should need take something for relief of minor pain, you may take acetaminophen which is found in Tylenol,m Datril, Anacin-3 and Panadol. It is not blood thinner. The products listed below are.  Do not take any of the products listed below in addition to any listed on your instruction sheet.  A.P.C or A.P.C with Codeine Codeine Phosphate Capsules #3 Ibuprofen Ridaura  ABC compound Congesprin Imuran rimadil  Advil Cope Indocin Robaxisal  Alka-Seltzer Effervescent Pain Reliever and Antacid Coricidin or Coricidin-D  Indomethacin Rufen    Alka-Seltzer plus Cold Medicine Cosprin Ketoprofen S-A-C Tablets  Anacin Analgesic Tablets or Capsules Coumadin Korlgesic Salflex  Anacin Extra Strength Analgesic tablets or capsules CP-2 Tablets Lanoril Salicylate  Anaprox Cuprimine Capsules Levenox Salocol  Anexsia-D Dalteparin Magan Salsalate  Anodynos Darvon compound Magnesium Salicylate Sine-off  Ansaid Dasin Capsules Magsal Sodium Salicylate  Anturane Depen Capsules Marnal Soma  APF Arthritis pain formula Dewitt's Pills Measurin Stanback  Argesic Dia-Gesic Meclofenamic Sulfinpyrazone  Arthritis Bayer Timed Release Aspirin Diclofenac Meclomen Sulindac  Arthritis pain formula Anacin Dicumarol Medipren Supac  Analgesic (Safety coated) Arthralgen Diffunasal Mefanamic Suprofen  Arthritis Strength Bufferin Dihydrocodeine Mepro Compound Suprol  Arthropan liquid Dopirydamole Methcarbomol with Aspirin Synalgos  ASA tablets/Enseals Disalcid Micrainin Tagament  Ascriptin Doan's Midol Talwin  Ascriptin A/D Dolene Mobidin Tanderil  Ascriptin Extra Strength Dolobid Moblgesic Ticlid  Ascriptin with Codeine Doloprin or Doloprin with Codeine Momentum Tolectin  Asperbuf Duoprin Mono-gesic Trendar  Aspergum Duradyne Motrin or Motrin IB Triminicin  Aspirin  plain, buffered or enteric coated Durasal Myochrisine Trigesic  Aspirin Suppositories Easprin Nalfon Trillsate  Aspirin with Codeine Ecotrin Regular or Extra Strength Naprosyn Uracel  Atromid-S Efficin Naproxen Ursinus  Auranofin Capsules Elmiron Neocylate Vanquish  Axotal Emagrin Norgesic Verin  Azathioprine Empirin or Empirin with Codeine Normiflo Vitamin E  Azolid Emprazil Nuprin Voltaren  Bayer Aspirin plain, buffered or children's or timed BC Tablets or powders Encaprin Orgaran Warfarin Sodium  Buff-a-Comp Enoxaparin Orudis Zorpin  Buff-a-Comp with Codeine Equegesic Os-Cal-Gesic   Buffaprin Excedrin plain, buffered or Extra Strength Oxalid   Bufferin Arthritis Strength Feldene Oxphenbutazone   Bufferin plain or Extra Strength Feldene Capsules Oxycodone with Aspirin   Bufferin with Codeine Fenoprofen Fenoprofen Pabalate or Pabalate-SF   Buffets II Flogesic Panagesic   Buffinol plain or Extra Strength Florinal or Florinal with Codeine Panwarfarin   Buf-Tabs Flurbiprofen Penicillamine   Butalbital Compound Four-way cold tablets Penicillin   Butazolidin Fragmin Pepto-Bismol   Carbenicillin Geminisyn Percodan   Carna Arthritis Reliever Geopen Persantine   Carprofen Gold's salt Persistin   Chloramphenicol Goody's Phenylbutazone   Chloromycetin Haltrain Piroxlcam   Clmetidine heparin Plaquenil   Cllnoril Hyco-pap Ponstel   Clofibrate Hydroxy chloroquine Propoxyphen         Before stopping any of these medications, be sure to consult the physician who ordered them.  Some, such as Coumadin (Warfarin) are ordered to prevent or treat serious conditions such as "deep thrombosis", "pumonary embolisms", and other heart problems.  The amount of time that you may need off of the medication may also vary with the medication and the reason for which you were taking it.  If you are taking any of these medications, please make sure you notify your pain physician before you undergo any  procedures.         Facet Blocks Patient Information  Description: The facets are joints in the spine between the vertebrae.  Like any joints in the body, facets can become irritated and painful.  Arthritis can also effect the facets.  By injecting steroids and local anesthetic in and around these joints, we can temporarily block the nerve supply to them.  Steroids act directly on irritated nerves and tissues to reduce selling and inflammation which often leads to decreased pain.  Facet blocks may be done anywhere along the spine from the neck to the low back depending upon the location of your pain.   After numbing the skin with local anesthetic (like Novocaine), a small needle is passed onto the facet joints under x-ray guidance.    You may experience a sensation of pressure while this is being done.  The entire block usually lasts about 15-25 minutes.   Conditions which may be treated by facet blocks:   Low back/buttock pain  Neck/shoulder pain  Certain types of headaches  Preparation for the injection:  1. Do not eat any solid food or dairy products within 8 hours of your appointment. 2. You may drink clear liquid up to 3 hours before appointment.  Clear liquids include water, black coffee, juice or soda.  No milk or cream please. 3. You may take your regular medication, including pain medications, with a sip of water before your appointment.  Diabetics should hold regular insulin (if taken separately) and take 1/2 normal NPH dose the morning of the procedure.  Carry some sugar containing items with you to your appointment. 4. A driver must accompany you and be prepared to drive you home after your procedure. 5. Bring all your current medications with you. 6. An IV may be inserted and sedation may be given at the discretion of the physician. 7. A blood pressure cuff, EKG and other monitors will often be applied during the procedure.  Some patients may need to have extra oxygen  administered for a short period. 8. You will be asked to provide medical information, including your allergies and medications, prior to the procedure.  We must know immediately if you are taking blood thinners (like Coumadin/Warfarin) or if you are allergic to IV iodine contrast (dye).  We must know if you could possible be pregnant.  Possible side-effects:   Bleeding from needle site  Infection (rare, may require surgery)  Nerve injury (rare)  Numbness & tingling (temporary)  Difficulty urinating (rare, temporary)  Spinal headache (a headache worse with upright posture)  Light-headedness (temporary)  Pain at injection site (serveral days)  Decreased blood pressure (rare, temporary)  Weakness in arm/leg (temporary)  Pressure sensation in back/neck (temporary)   Call if you experience:   Fever/chills associated with headache or increased back/neck pain  Headache worsened by an upright position  New onset, weakness or numbness of an extremity below the injection site  Hives or difficulty breathing (go to the emergency room)  Inflammation or drainage at the injection site(s)  Severe back/neck pain greater than usual  New symptoms which are concerning to you  Please note:  Although the local anesthetic injected can often make your back or neck feel good for several hours after the injection, the pain will likely return. It takes 3-7 days for steroids to work.  You may not notice any pain relief for at least one week.  If effective, we will often do a series of 2-3 injections spaced 3-6 weeks apart to maximally decrease your pain.  After the initial series, you may be a candidate for a more permanent nerve block of the facets.  If you have any questions, please call [phone] [facility] 

## 2015-11-14 NOTE — Progress Notes (Signed)
Pre visit review using our clinic review tool, if applicable. No additional management support is needed unless otherwise documented below in the visit note. 

## 2015-11-14 NOTE — Progress Notes (Signed)
Patient ID: Amanda Soto, female   DOB: 11-13-1924, 82 y.o.   MRN: [id number]  [name], MD Phone: [phone]  Amanda Soto is a 82 y.o. female who presents today for hospital follow-up.  Patient was hospitalized last week due to symptomatic anemia. Hemoglobin on recheck in the office was 7.8 and she was sent to the emergency room. She was given 1 unit of blood. Next hemoglobin was 9.1. She notes she feels better. Not quite as weak. Minimal lightheadedness. Minimally getting winded at the start of exertion though this improves and resolves with more exertion. No chest pain. No palpitations. No bleeding noted. She had negative FOBT in the office several months ago and negative FOBT in the ED. GI was consulted and deferred workup at this time. She has follow-up with GI later this week. Patient also notes she started taking her fluid pill again. This was stopped given that she had been lightheaded. She is taking amiodarone for her atrial fibrillation. She does not have follow-up set up with cardiology at this time.  PMH: nonsmoker.   ROS see history of present illness  Objective  Physical Exam Filed Vitals:   11/14/15 1135  BP: 108/76  Pulse: 99  Temp: 98 F (36.7 C)    BP Readings from Last 3 Encounters:  11/14/15 108/76  11/11/15 112/52  11/07/15 102/64   Wt Readings from Last 3 Encounters:  11/14/15 127 lb 9.6 oz (57.879 kg)  11/11/15 129 lb 11.2 oz (58.832 kg)  11/07/15 127 lb 2 oz (57.664 kg)    Physical Exam  Constitutional: She is well-developed, well-nourished, and in no distress.  HENT:  Head: Normocephalic and atraumatic.  Cardiovascular: Normal rate and normal heart sounds.  An irregularly irregular rhythm present.  Pulmonary/Chest: Effort normal and breath sounds normal.  Musculoskeletal: She exhibits no edema.  Neurological: She is alert. Gait normal.  Skin: Skin is warm and dry. She is not diaphoretic.     Assessment/Plan: Please see individual  problem list.  Anemia Symptomatically improved. Minimal lightheadedness. Minimal weakness. Minimal getting winded at start of exertion though this improves with exertion. Suspect this was the cause of her lightheadedness last week. We will recheck a CBC today. We'll arrange follow-up with her cardiologist to discuss her eliquis. She will keep her follow-up with GI. She will resume her Lasix. She'll continue to monitor. She is given return precautions.  Lightheadedness Improving. Suspect related to anemia. She will continue Lasix as she has already restarted this. He will continue to monitor. Given return precautions.  Atrial fibrillation Rate controlled today. Asymptomatic today. She will be set up a follow-up appointment with her cardiologist to discuss her anticoagulation given her recent anemia. She's given return precautions.    Orders Placed This Encounter  Procedures  . CBC     [name], MD [place]

## 2015-11-14 NOTE — Progress Notes (Signed)
Safety precautions to be maintained throughout the outpatient stay will include: orient to surroundings, keep bed in low position, maintain call bell within reach at all times, provide assistance with transfer out of bed and ambulation.  

## 2015-11-14 NOTE — Patient Instructions (Signed)
Nice to see you. I am glad you have improved from your hospitalization. We'll recheck your blood counts today. We will have you your cardiologist to discuss the eloquis and her atrial fibrillation. Please keep your follow-up with GI. If you develop worsening lightheadedness, chest pain, shortness of breath, palpitations, bleeding, or any new or changing symptoms please seek medical attention.

## 2015-11-14 NOTE — Assessment & Plan Note (Addendum)
Improving. Suspect related to anemia. She will continue Lasix as she has already restarted this. He will continue to monitor. Given return precautions.

## 2015-11-14 NOTE — Progress Notes (Signed)
Patient's Name: Amanda Soto  Patient type: New patient  MRN: [id number]  Service setting: Ambulatory outpatient  DOB: 1925/03/29  Location: [facility]  DOS: 11/14/2015  Primary Care Physician: [name], MD  Note by: Kathlen Brunswick. Dossie Arbour, M.D, DABA, DABAPM, DABPM, DABIPP, FIPP  Referring Physician: [name], MD  Specialty: Board-Certified Interventional Pain Management     Primary Reason(s) for Visit: Initial Patient Evaluation CC: Back Pain   HPI  Amanda Soto is a 82 y.o. year old, female patient, who comes today for an initial evaluation. She has Chronic systolic heart failure ([place]); Bradycardia; Atrial fibrillation ([place]); HTN (hypertension); Abdominal discomfort; Exertional shortness of breath; Elevated glucose; Hypothyroidism; Lightheadedness; Symptomatic anemia; Anemia; Anxiety; Degeneration of intervertebral disc of lumbar region; Recurrent major depressive disorder, in partial remission ([place]); Encounter for general adult medical examination without abnormal findings; Cardiac murmur; H/O neoplasm; HLD (hyperlipidemia); Adult hypothyroidism; MI (mitral incompetence); OP (osteoporosis); Chronic pain; Chronic low back pain (Location of Primary Source of Pain) (Right); Lumbar facet syndrome (Location of Primary Source of Pain) (Right); Grade 1 Anterolisthesis of L4 over L5 (5 mm); Lumbar spondylosis; Scoliosis of lumbar spine (concave to right side); Lumbar facet arthropathy; Osteoarthritis of hip (Right); and Thoracic paracentral T7-8 disc protrusion on her problem list.. Her primarily concern today is the Back Pain   Pain Assessment: Self-Reported Pain Score: 10-Worst pain ever (when walking), clinically she looks like a 3/10. Reported level of pain is not compatible with clinical observations. This symptom exaggeration may be due to malingering, an emotional response, Somatic Symptom Disorder, or a lack of understanding on how the pain scale works. the  patient was provided today with information about the pain score. Pain Location: Back Pain Orientation: Lower, Right Pain Descriptors / Indicators: Aching, Sharp Pain Frequency: Intermittent  Onset and Duration: Gradual, Date of onset: 10 years ago and Present longer than 3 months Cause of pain: Arthritis Severity: Getting worse, NAS-11 at its worse: 10/10, NAS-11 at its best: 1/10, NAS-11 now: 3/10 and NAS-11 on the average: 6/10 Timing: Not influenced by the time of the day and During activity or exercise Aggravating Factors: Bending, Climbing, Eating, Kneeling, Lifiting, Motion, Prolonged standing, Stooping  and Walking Alleviating Factors: Resting, Sitting and Sleeping Associated Problems: Inability to concentrate Quality of Pain: Aching, Agonizing, Annoying, Intermittent, Disabling, Distressing, Exhausting, Nagging, Pressure-like, Sharp, Shooting, Stabbing, Tender, Toothache-like and Uncomfortable Previous Examinations or Tests: CT scan, MRI scan and Nerve block Previous Treatments: Epidural steroid injections and Narcotic medications  The patient comes into the clinics today indicating that she is not interested in getting any type of pain medication. She will like to see if there is anything that we can do to eliminate some of this right lower back pain that she is experiencing. She apparently has been seen by Dr. Sharlet Salina in the past. She indicates having had an epidural steroid injection which did not give her any significant relief of the pain.  Historic Controlled Substance Pharmacotherapy Review  Previously Prescribed Opioids: Tramadol Currently Prescribed Analgesic: Tramadol Pharmacodynamics: N/A Historical Background Evaluation: N/A Risk Assessment: Opioid Risk Tool (ORT) Score: Total Score: 0 Low Risk for SUD (Score <3) Depression Scale Score: PHQ-2: PHQ-2 Total Score: 0 No depression (0) PHQ-9: PHQ-9 Total Score: 0 No depression (0-4)  Pharmacologic Plan: the patient  has requested not to be given any pain medication.  Meds  The patient has a current medication list which includes the following prescription(s): acetaminophen, amiodarone, apixaban, atorvastatin, iron polysaccharides, synthroid,  and tramadol.  ROS  Cardiovascular History: Abnormal heart rhythm and Blood thinners:  Anticoagulant (Elaquis) (atrial fibrillation) Pulmonary or Respiratory History: Negative for bronchial asthma, emphysema, chronic smoking, chronic bronchitis, sarcoidosis, tuberculosis or sleep apena Neurological History: Negative for epilepsy, stroke, urinary or fecal inontinence, spina bifida or tethered cord syndrome Review of Past Neurological Studies: No results found for this or any previous visit. Psychological-Psychiatric History: Negative for anxiety, depression, schizophrenia, bipolar disorders or suicidal ideations or attempts Gastrointestinal History: Negative for peptic ulcer disease, hiatal hernia, GERD, IBS, hepatitis, cirrhosis or pancreatitis Genitourinary History: Negative for nephrolithiasis, hematuria, renal failure or chronic kidney disease Hematological History: Anemia, Brusing easily and Thrombocytopenia Endocrine History: Hypothyroidism Rheumatologic History: Negative for lupus, osteoarthritis, rheumatoid arthritis, myositis, polymyositis or fibromyagia Musculoskeletal History: Negative for myasthenia gravis, muscular dystrophy, multiple sclerosis or malignant hyperthermia Work History: Retired  Allergies  Ms. Soto is allergic to hydrocodone and ciprofloxacin.  [place]  Medical:  Ms. Amanda  has a past medical history of CHF (congestive heart failure) ([place]); Hypertension; Hyperlipidemia; Atrial fibrillation ([place]); Hypothyroid; Arthritis; Depression; Skin cancer; Chickenpox; Diverticulitis; GERD (gastroesophageal reflux disease); Heart murmur; Chronic abdominal pain (01/16/2014); Neuritis or radiculitis due to rupture of lumbar intervertebral disc (11/18/2013);  Trochanteric bursitis of right hip (11/18/2013); Degenerative arthritis of lumbar spine (11/18/2013); and Degenerative arthritis of hip (08/19/2014). Family: family history includes Breast cancer in her mother and sister; Stroke in her maternal grandmother. Surgical:  has past surgical history that includes Cataract extraction; Partial hysterectomy; Appendectomy; and Tonsillectomy. Tobacco:  reports that she has never smoked. She has never used smokeless tobacco. Alcohol:  reports that she does not drink alcohol. Drug:  reports that she does not use illicit drugs. Active Ambulatory Problems    Diagnosis Date Noted  . Chronic systolic heart failure ([place]) 11/09/2014  . Bradycardia 11/09/2014  . Atrial fibrillation ([place]) 11/09/2014  . HTN (hypertension) 11/09/2014  . Abdominal discomfort 07/27/2015  . Exertional shortness of breath 08/25/2015  . Elevated glucose 09/07/2015  . Hypothyroidism 11/07/2015  . Lightheadedness 11/07/2015  . Symptomatic anemia 11/09/2015  . Anemia 11/10/2015  . Anxiety 01/16/2014  . Degeneration of intervertebral disc of lumbar region 11/18/2013  . Recurrent major depressive disorder, in partial remission ([place]) 01/16/2014  . Encounter for general adult medical examination without abnormal findings 05/23/2015  . Cardiac murmur 11/14/2015  . H/O neoplasm 10/21/2012  . HLD (hyperlipidemia) 11/14/2015  . Adult hypothyroidism 11/14/2015  . MI (mitral incompetence) 11/14/2015  . OP (osteoporosis) 11/14/2015  . Chronic pain 11/14/2015  . Chronic low back pain (Location of Primary Source of Pain) (Right) 11/14/2015  . Lumbar facet syndrome (Location of Primary Source of Pain) (Right) 11/14/2015  . Grade 1 Anterolisthesis of L4 over L5 (5 mm) 11/14/2015  . Lumbar spondylosis 11/15/2015  . Scoliosis of lumbar spine (concave to right side) 11/15/2015  . Lumbar facet arthropathy 11/15/2015  . Osteoarthritis of hip (Right) 11/15/2015  . Thoracic paracentral T7-8 disc  protrusion 11/15/2015   Resolved Ambulatory Problems    Diagnosis Date Noted  . No Resolved Ambulatory Problems   Past Medical History  Diagnosis Date  . CHF (congestive heart failure) ([place])   . Hypertension   . Hyperlipidemia   . Hypothyroid   . Arthritis   . Depression   . Skin cancer   . Chickenpox   . Diverticulitis   . GERD (gastroesophageal reflux disease)   . Heart murmur   . Chronic abdominal pain 01/16/2014  . Neuritis or radiculitis due to rupture of  lumbar intervertebral disc 11/18/2013  . Trochanteric bursitis of right hip 11/18/2013  . Degenerative arthritis of lumbar spine 11/18/2013  . Degenerative arthritis of hip 08/19/2014    Constitutional Exam  Vitals: Blood pressure 104/86, pulse 95, temperature 98.4 F (36.9 C), resp. rate 16, height 4\' 11"  (1.499 m), weight 127 lb (57.607 kg), SpO2 95 %. General appearance: Well nourished, well developed, and well hydrated. In no acute distress Calculated BMI/Body habitus: Body mass index is 25.64 kg/(m^2). (25-29.9 kg/m2) Overweight - 20% higher incidence of chronic pain Psych/Mental status: Alert and oriented x 3 (person, place, & time) Eyes: PERLA Respiratory: No evidence of acute respiratory distress  Cervical Spine Exam  Inspection: No masses, redness, or swelling Alignment: Symmetrical ROM: Functional: Adequate ROM Active: Unrestricted ROM Stability: No instability detected Muscle strength & Tone: Functionally intact Sensory: Unimpaired Palpation: No complaints of tenderness  Upper Extremity (UE) Exam    Side: Right upper extremity  Side: Left upper extremity  Inspection: No masses, redness, swelling, or asymmetry  Inspection: No masses, redness, swelling, or asymmetry  ROM:  ROM:  Functional: Adequate ROM  Functional: Adequate ROM  Active: Unrestricted ROM  Active: Unrestricted ROM  Muscle strength & Tone: Functionally intact  Muscle strength & Tone: Functionally intact  Sensory: Unimpaired  Sensory:  Unimpaired  Palpation: Non-contributory  Palpation: Non-contributory   Thoracic Spine Exam  Inspection: No masses, redness, or swelling Alignment: Symmetrical ROM: Functional: Adequate ROM Active: Unrestricted ROM Stability: No instability detected Sensory: Unimpaired Muscle strength & Tone: Functionally intact Palpation: No complaints of tenderness  Lumbar Spine Exam  Inspection: No masses, redness, or swelling Alignment: Symmetrical ROM: Functional: Limited ROM Active: Limited ROM Stability: No instability detected Muscle strength & Tone: Functionally intact Sensory: Unimpaired Palpation: Tender Provocative Tests: Lumbar Hyperextension and rotation test: Positive for right lumbar facet pain Patrick's Maneuver: deferred  Gait & Posture Assessment  Gait: Patient ambulates using a cane. Antalgic Posture: Difficulty with positional changes  Lower Extremity Exam    Side: Right lower extremity  Side: Left lower extremity  Inspection: No masses, redness, swelling, or asymmetry ROM:  Inspection: No masses, redness, swelling, or asymmetry ROM:  Functional: Adequate ROM  Functional: Adequate ROM  Active: Unrestricted ROM  Active: Unrestricted ROM  Muscle strength & Tone: Severe deconditioning  Muscle strength & Tone: Severe deconditioning  Sensory: Unimpaired  Sensory: Unimpaired  Palpation: Non-contributory  Palpation: Non-contributory   Assessment  Primary Diagnosis & Pertinent Problem List: The primary encounter diagnosis was Chronic pain. Diagnoses of Chronic low back pain (Location of Primary Source of Pain) (Right), Lumbar facet syndrome (Location of Primary Source of Pain) (Right), Grade 1 Anterolisthesis of L4 over L5, Lumbar spondylosis, unspecified spinal osteoarthritis, Scoliosis of lumbar spine (concave to right side), Lumbar facet arthropathy, Primary osteoarthritis of right hip, and Bulge of thoracic disc without myelopathy were also pertinent to this  visit.  Visit Diagnosis: 1. Chronic pain   2. Chronic low back pain (Location of Primary Source of Pain) (Right)   3. Lumbar facet syndrome (Location of Primary Source of Pain) (Right)   4. Grade 1 Anterolisthesis of L4 over L5   5. Lumbar spondylosis, unspecified spinal osteoarthritis   6. Scoliosis of lumbar spine (concave to right side)   7. Lumbar facet arthropathy   8. Primary osteoarthritis of right hip   9. Bulge of thoracic disc without myelopathy     Assessment: No problem-specific assessment & plan notes found for this encounter.   Plan of Care  Initial  Treatment Plan:  The patient has requested not to be evaluated or given any pain medication.  Problem List Items Addressed This Visit      High   Chronic low back pain (Location of Primary Source of Pain) (Right) (Chronic)   Relevant Orders   DG Lumbar Spine Complete W/Bend (Completed)   Chronic pain - Primary (Chronic)   Grade 1 Anterolisthesis of L4 over L5 (5 mm) (Chronic)   Relevant Orders   DG Lumbar Spine Complete W/Bend (Completed)   Lumbar facet arthropathy (Chronic)   Lumbar facet syndrome (Location of Primary Source of Pain) (Right) (Chronic)   Relevant Orders   LUMBAR FACET(MEDIAL BRANCH NERVE BLOCK) MBNB   Lumbar spondylosis (Chronic)   Osteoarthritis of hip (Right) (Chronic)   Scoliosis of lumbar spine (concave to right side) (Chronic)   Thoracic paracentral T7-8 disc protrusion (Chronic)      Pharmacotherapy (Medications Ordered): No orders of the defined types were placed in this encounter.    Lab-work & Procedure Ordered: Orders Placed This Encounter  Procedures  . LUMBAR FACET(MEDIAL BRANCH NERVE BLOCK) MBNB  . DG Lumbar Spine Complete W/Bend    Imaging Ordered: None  Interventional Therapies: Scheduled: Diagnostic right-sided lumbar facet block under fluoroscopic guidance, no sedation. Considering: Lumbar facet radiofrequency ablation PRN Procedures: None at this time.    Referral(s) or Consult(s): None  Medications administered during this visit: Ms. Reuther does not currently have medications on file.  Prescriptions ordered during this visit: New Prescriptions   No medications on file    Requested PM Follow-up: Return for Procedure (Scheduled).  Future Appointments Date Time Provider [place]  11/24/2015 10:00 AM Milinda Pointer, MD [facility] None  12/21/2015 10:30 AM [name], MD [facility] None  01/11/2016 1:30 PM [name], MD [facility] None     Primary Care Physician: [name], MD Location: [facility] Note by: Kathlen Brunswick. Dossie Arbour, M.D, DABA, DABAPM, DABPM, DABIPP, FIPP  Pain Score Disclaimer: We use the NRS-11 scale. This is a self-reported, subjective measurement of pain severity with only modest accuracy. It is used primarily to identify changes within a particular patient. It must be understood that outpatient pain scales are significantly less accurate that those used for research, where they can be applied under ideal controlled circumstances with minimal exposure to variables. In reality, the score is likely to be a combination of pain intensity and pain affect, where pain affect describes the degree of emotional arousal or changes in action readiness caused by the sensory experience of pain. Factors such as social and work situation, setting, emotional state, anxiety levels, expectation, and prior pain experience may influence pain perception and show large inter-individual differences that may also be affected by time variables.  Patient instructions provided during this appointment: Patient Instructions   GENERAL RISKS AND COMPLICATIONS  What are the risk, side effects and possible complications? Generally speaking, most procedures are safe.  However, with any procedure there are risks, side effects, and the possibility of complications.  The risks and complications are  dependent upon the sites that are lesioned, or the type of nerve block to be performed.  The closer the procedure is to the spine, the more serious the risks are.  Great care is taken when placing the radio frequency needles, block needles or lesioning probes, but sometimes complications can occur. 1. Infection: Any time there is an injection through the skin, there is a risk of infection.  This is why sterile conditions are  used for these blocks.  There are four possible types of infection. 1. Localized skin infection. 2. Central Nervous System Infection-This can be in the form of Meningitis, which can be deadly. 3. Epidural Infections-This can be in the form of an epidural abscess, which can cause pressure inside of the spine, causing compression of the spinal cord with subsequent paralysis. This would require an emergency surgery to decompress, and there are no guarantees that the patient would recover from the paralysis. 4. Discitis-This is an infection of the intervertebral discs.  It occurs in about 1% of discography procedures.  It is difficult to treat and it may lead to surgery.        2. Pain: the needles have to go through skin and soft tissues, will cause soreness.       3. Damage to internal structures:  The nerves to be lesioned may be near blood vessels or    other nerves which can be potentially damaged.       4. Bleeding: Bleeding is more common if the patient is taking blood thinners such as  aspirin, Coumadin, Ticiid, Plavix, etc., or if he/she have some genetic predisposition  such as hemophilia. Bleeding into the spinal canal can cause compression of the spinal  cord with subsequent paralysis.  This would require an emergency surgery to  decompress and there are no guarantees that the patient would recover from the  paralysis.       5. Pneumothorax:  Puncturing of a lung is a possibility, every time a needle is introduced in  the area of the chest or upper back.  Pneumothorax refers  to free air around the  collapsed lung(s), inside of the thoracic cavity (chest cavity).  Another two possible  complications related to a similar event would include: Hemothorax and Chylothorax.   These are variations of the Pneumothorax, where instead of air around the collapsed  lung(s), you may have blood or chyle, respectively.       6. Spinal headaches: They may occur with any procedures in the area of the spine.       7. Persistent CSF (Cerebro-Spinal Fluid) leakage: This is a rare problem, but may occur  with prolonged intrathecal or epidural catheters either due to the formation of a fistulous  track or a dural tear.       8. Nerve damage: By working so close to the spinal cord, there is always a possibility of  nerve damage, which could be as serious as a permanent spinal cord injury with  paralysis.       9. Death:  Although rare, severe deadly allergic reactions known as "Anaphylactic  reaction" can occur to any of the medications used.      10. Worsening of the symptoms:  We can always make thing worse.  What are the chances of something like this happening? Chances of any of this occuring are extremely low.  By statistics, you have more of a chance of getting killed in a motor vehicle accident: while driving to the hospital than any of the above occurring .  Nevertheless, you should be aware that they are possibilities.  In general, it is similar to taking a shower.  Everybody knows that you can slip, hit your head and get killed.  Does that mean that you should not shower again?  Nevertheless always keep in mind that statistics do not mean anything if you happen to be on the wrong side of them.  Even if  a procedure has a 1 (one) in a 1,000,000 (million) chance of going wrong, it you happen to be that one..Also, keep in mind that by statistics, you have more of a chance of having something go wrong when taking medications.  Who should not have this procedure? If you are on a blood thinning  medication (e.g. Coumadin, Plavix, see list of "Blood Thinners"), or if you have an active infection going on, you should not have the procedure.  If you are taking any blood thinners, please inform your physician.  How should I prepare for this procedure?  Do not eat or drink anything at least six hours prior to the procedure.  Bring a driver with you .  It cannot be a taxi.  Come accompanied by an adult that can drive you back, and that is strong enough to help you if your legs get weak or numb from the local anesthetic.  Take all of your medicines the morning of the procedure with just enough water to swallow them.  If you have diabetes, make sure that you are scheduled to have your procedure done first thing in the morning, whenever possible.  If you have diabetes, take only half of your insulin dose and notify our nurse that you have done so as soon as you arrive at the clinic.  If you are diabetic, but only take blood sugar pills (oral hypoglycemic), then do not take them on the morning of your procedure.  You may take them after you have had the procedure.  Do not take aspirin or any aspirin-containing medications, at least eleven (11) days prior to the procedure.  They may prolong bleeding.  Wear loose fitting clothing that may be easy to take off and that you would not mind if it got stained with Betadine or blood.  Do not wear any jewelry or perfume  Remove any nail coloring.  It will interfere with some of our monitoring equipment.  NOTE: Remember that this is not meant to be interpreted as a complete list of all possible complications.  Unforeseen problems may occur.  BLOOD THINNERS The following drugs contain aspirin or other products, which can cause increased bleeding during surgery and should not be taken for 2 weeks prior to and 1 week after surgery.  If you should need take something for relief of minor pain, you may take acetaminophen which is found in Tylenol,m  Datril, Anacin-3 and Panadol. It is not blood thinner. The products listed below are.  Do not take any of the products listed below in addition to any listed on your instruction sheet.  A.P.C or A.P.C with Codeine Codeine Phosphate Capsules #3 Ibuprofen Ridaura  ABC compound Congesprin Imuran rimadil  Advil Cope Indocin Robaxisal  Alka-Seltzer Effervescent Pain Reliever and Antacid Coricidin or Coricidin-D  Indomethacin Rufen  Alka-Seltzer plus Cold Medicine Cosprin Ketoprofen S-A-C Tablets  Anacin Analgesic Tablets or Capsules Coumadin Korlgesic Salflex  Anacin Extra Strength Analgesic tablets or capsules CP-2 Tablets Lanoril Salicylate  Anaprox Cuprimine Capsules Levenox Salocol  Anexsia-D Dalteparin Magan Salsalate  Anodynos Darvon compound Magnesium Salicylate Sine-off  Ansaid Dasin Capsules Magsal Sodium Salicylate  Anturane Depen Capsules Marnal Soma  APF Arthritis pain formula Dewitt's Pills Measurin Stanback  Argesic Dia-Gesic Meclofenamic Sulfinpyrazone  Arthritis Bayer Timed Release Aspirin Diclofenac Meclomen Sulindac  Arthritis pain formula Anacin Dicumarol Medipren Supac  Analgesic (Safety coated) Arthralgen Diffunasal Mefanamic Suprofen  Arthritis Strength Bufferin Dihydrocodeine Mepro Compound Suprol  Arthropan liquid Dopirydamole Methcarbomol with Aspirin Synalgos  ASA tablets/Enseals Disalcid Micrainin Tagament  Ascriptin Doan's Midol Talwin  Ascriptin A/D Dolene Mobidin Tanderil  Ascriptin Extra Strength Dolobid Moblgesic Ticlid  Ascriptin with Codeine Doloprin or Doloprin with Codeine Momentum Tolectin  Asperbuf Duoprin Mono-gesic Trendar  Aspergum Duradyne Motrin or Motrin IB Triminicin  Aspirin plain, buffered or enteric coated Durasal Myochrisine Trigesic  Aspirin Suppositories Easprin Nalfon Trillsate  Aspirin with Codeine Ecotrin Regular or Extra Strength Naprosyn Uracel  Atromid-S Efficin Naproxen Ursinus  Auranofin Capsules Elmiron Neocylate Vanquish   Axotal Emagrin Norgesic Verin  Azathioprine Empirin or Empirin with Codeine Normiflo Vitamin E  Azolid Emprazil Nuprin Voltaren  Bayer Aspirin plain, buffered or children's or timed BC Tablets or powders Encaprin Orgaran Warfarin Sodium  Buff-a-Comp Enoxaparin Orudis Zorpin  Buff-a-Comp with Codeine Equegesic Os-Cal-Gesic   Buffaprin Excedrin plain, buffered or Extra Strength Oxalid   Bufferin Arthritis Strength Feldene Oxphenbutazone   Bufferin plain or Extra Strength Feldene Capsules Oxycodone with Aspirin   Bufferin with Codeine Fenoprofen Fenoprofen Pabalate or Pabalate-SF   Buffets II Flogesic Panagesic   Buffinol plain or Extra Strength Florinal or Florinal with Codeine Panwarfarin   Buf-Tabs Flurbiprofen Penicillamine   Butalbital Compound Four-way cold tablets Penicillin   Butazolidin Fragmin Pepto-Bismol   Carbenicillin Geminisyn Percodan   Carna Arthritis Reliever Geopen Persantine   Carprofen Gold's salt Persistin   Chloramphenicol Goody's Phenylbutazone   Chloromycetin Haltrain Piroxlcam   Clmetidine heparin Plaquenil   Cllnoril Hyco-pap Ponstel   Clofibrate Hydroxy chloroquine Propoxyphen         Before stopping any of these medications, be sure to consult the physician who ordered them.  Some, such as Coumadin (Warfarin) are ordered to prevent or treat serious conditions such as "deep thrombosis", "pumonary embolisms", and other heart problems.  The amount of time that you may need off of the medication may also vary with the medication and the reason for which you were taking it.  If you are taking any of these medications, please make sure you notify your pain physician before you undergo any procedures.         Facet Blocks Patient Information  Description: The facets are joints in the spine between the vertebrae.  Like any joints in the body, facets can become irritated and painful.  Arthritis can also effect the facets.  By injecting steroids and local  anesthetic in and around these joints, we can temporarily block the nerve supply to them.  Steroids act directly on irritated nerves and tissues to reduce selling and inflammation which often leads to decreased pain.  Facet blocks may be done anywhere along the spine from the neck to the low back depending upon the location of your pain.   After numbing the skin with local anesthetic (like Novocaine), a small needle is passed onto the facet joints under x-ray guidance.  You may experience a sensation of pressure while this is being done.  The entire block usually lasts about 15-25 minutes.   Conditions which may be treated by facet blocks:   Low back/buttock pain  Neck/shoulder pain  Certain types of headaches  Preparation for the injection:  1. Do not eat any solid food or dairy products within 8 hours of your appointment. 2. You may drink clear liquid up to 3 hours before appointment.  Clear liquids include water, black coffee, juice or soda.  No milk or cream please. 3. You may take your regular medication, including pain medications, with a sip of water before your appointment.  Diabetics should  hold regular insulin (if taken separately) and take 1/2 normal NPH dose the morning of the procedure.  Carry some sugar containing items with you to your appointment. 4. A driver must accompany you and be prepared to drive you home after your procedure. 5. Bring all your current medications with you. 6. An IV may be inserted and sedation may be given at the discretion of the physician. 7. A blood pressure cuff, EKG and other monitors will often be applied during the procedure.  Some patients may need to have extra oxygen administered for a short period. 8. You will be asked to provide medical information, including your allergies and medications, prior to the procedure.  We must know immediately if you are taking blood thinners (like Coumadin/Warfarin) or if you are allergic to IV iodine contrast  (dye).  We must know if you could possible be pregnant.  Possible side-effects:   Bleeding from needle site  Infection (rare, may require surgery)  Nerve injury (rare)  Numbness & tingling (temporary)  Difficulty urinating (rare, temporary)  Spinal headache (a headache worse with upright posture)  Light-headedness (temporary)  Pain at injection site (serveral days)  Decreased blood pressure (rare, temporary)  Weakness in arm/leg (temporary)  Pressure sensation in back/neck (temporary)   Call if you experience:   Fever/chills associated with headache or increased back/neck pain  Headache worsened by an upright position  New onset, weakness or numbness of an extremity below the injection site  Hives or difficulty breathing (go to the emergency room)  Inflammation or drainage at the injection site(s)  Severe back/neck pain greater than usual  New symptoms which are concerning to you  Please note:  Although the local anesthetic injected can often make your back or neck feel good for several hours after the injection, the pain will likely return. It takes 3-7 days for steroids to work.  You may not notice any pain relief for at least one week.  If effective, we will often do a series of 2-3 injections spaced 3-6 weeks apart to maximally decrease your pain.  After the initial series, you may be a candidate for a more permanent nerve block of the facets.  If you have any questions, please call #[phone]) [facility]

## 2015-11-15 ENCOUNTER — Encounter: Payer: Self-pay | Admitting: Pain Medicine

## 2015-11-15 DIAGNOSIS — M419 Scoliosis, unspecified: Secondary | ICD-10-CM | POA: Insufficient documentation

## 2015-11-15 DIAGNOSIS — M47816 Spondylosis without myelopathy or radiculopathy, lumbar region: Secondary | ICD-10-CM | POA: Insufficient documentation

## 2015-11-15 DIAGNOSIS — M5124 Other intervertebral disc displacement, thoracic region: Secondary | ICD-10-CM | POA: Insufficient documentation

## 2015-11-15 DIAGNOSIS — M5134 Other intervertebral disc degeneration, thoracic region: Secondary | ICD-10-CM | POA: Insufficient documentation

## 2015-11-15 DIAGNOSIS — M1611 Unilateral primary osteoarthritis, right hip: Secondary | ICD-10-CM | POA: Insufficient documentation

## 2015-11-16 DIAGNOSIS — M545 Low back pain: Secondary | ICD-10-CM | POA: Diagnosis not present

## 2015-11-16 DIAGNOSIS — Z01818 Encounter for other preprocedural examination: Secondary | ICD-10-CM | POA: Diagnosis not present

## 2015-11-16 DIAGNOSIS — I480 Paroxysmal atrial fibrillation: Secondary | ICD-10-CM | POA: Diagnosis not present

## 2015-11-16 DIAGNOSIS — I499 Cardiac arrhythmia, unspecified: Secondary | ICD-10-CM | POA: Diagnosis not present

## 2015-11-24 ENCOUNTER — Ambulatory Visit: Payer: Medicare Other | Attending: Pain Medicine | Admitting: Pain Medicine

## 2015-11-24 ENCOUNTER — Encounter: Payer: Self-pay | Admitting: Pain Medicine

## 2015-11-24 VITALS — BP 130/59 | HR 78 | Temp 97.6°F | Resp 18 | Ht 59.0 in | Wt 122.0 lb

## 2015-11-24 DIAGNOSIS — R42 Dizziness and giddiness: Secondary | ICD-10-CM | POA: Insufficient documentation

## 2015-11-24 DIAGNOSIS — M545 Low back pain, unspecified: Secondary | ICD-10-CM

## 2015-11-24 DIAGNOSIS — F329 Major depressive disorder, single episode, unspecified: Secondary | ICD-10-CM | POA: Insufficient documentation

## 2015-11-24 DIAGNOSIS — I5022 Chronic systolic (congestive) heart failure: Secondary | ICD-10-CM | POA: Diagnosis not present

## 2015-11-24 DIAGNOSIS — M1611 Unilateral primary osteoarthritis, right hip: Secondary | ICD-10-CM | POA: Insufficient documentation

## 2015-11-24 DIAGNOSIS — E785 Hyperlipidemia, unspecified: Secondary | ICD-10-CM | POA: Insufficient documentation

## 2015-11-24 DIAGNOSIS — R001 Bradycardia, unspecified: Secondary | ICD-10-CM | POA: Insufficient documentation

## 2015-11-24 DIAGNOSIS — I4891 Unspecified atrial fibrillation: Secondary | ICD-10-CM | POA: Insufficient documentation

## 2015-11-24 DIAGNOSIS — I10 Essential (primary) hypertension: Secondary | ICD-10-CM | POA: Insufficient documentation

## 2015-11-24 DIAGNOSIS — G8929 Other chronic pain: Secondary | ICD-10-CM | POA: Diagnosis not present

## 2015-11-24 DIAGNOSIS — R109 Unspecified abdominal pain: Secondary | ICD-10-CM | POA: Insufficient documentation

## 2015-11-24 DIAGNOSIS — R011 Cardiac murmur, unspecified: Secondary | ICD-10-CM | POA: Insufficient documentation

## 2015-11-24 DIAGNOSIS — D649 Anemia, unspecified: Secondary | ICD-10-CM | POA: Insufficient documentation

## 2015-11-24 DIAGNOSIS — M5136 Other intervertebral disc degeneration, lumbar region: Secondary | ICD-10-CM | POA: Diagnosis not present

## 2015-11-24 DIAGNOSIS — M47816 Spondylosis without myelopathy or radiculopathy, lumbar region: Secondary | ICD-10-CM | POA: Diagnosis not present

## 2015-11-24 DIAGNOSIS — R0602 Shortness of breath: Secondary | ICD-10-CM | POA: Diagnosis not present

## 2015-11-24 DIAGNOSIS — E039 Hypothyroidism, unspecified: Secondary | ICD-10-CM | POA: Insufficient documentation

## 2015-11-24 DIAGNOSIS — M5124 Other intervertebral disc displacement, thoracic region: Secondary | ICD-10-CM | POA: Insufficient documentation

## 2015-11-24 DIAGNOSIS — F419 Anxiety disorder, unspecified: Secondary | ICD-10-CM | POA: Insufficient documentation

## 2015-11-24 DIAGNOSIS — M4316 Spondylolisthesis, lumbar region: Secondary | ICD-10-CM | POA: Diagnosis not present

## 2015-11-24 DIAGNOSIS — M4186 Other forms of scoliosis, lumbar region: Secondary | ICD-10-CM | POA: Insufficient documentation

## 2015-11-24 DIAGNOSIS — M549 Dorsalgia, unspecified: Secondary | ICD-10-CM | POA: Diagnosis present

## 2015-11-24 MED ORDER — LIDOCAINE HCL (PF) 1 % IJ SOLN
10.0000 mL | Freq: Once | INTRAMUSCULAR | Status: AC
  Administered 2015-11-24: 10 mL
  Filled 2015-11-24: qty 10

## 2015-11-24 MED ORDER — MIDAZOLAM HCL 5 MG/5ML IJ SOLN
1.0000 mg | INTRAMUSCULAR | Status: DC | PRN
  Filled 2015-11-24: qty 5

## 2015-11-24 MED ORDER — TRIAMCINOLONE ACETONIDE 40 MG/ML IJ SUSP
40.0000 mg | Freq: Once | INTRAMUSCULAR | Status: AC
  Administered 2015-11-24: 40 mg
  Filled 2015-11-24: qty 1

## 2015-11-24 MED ORDER — ROPIVACAINE HCL 2 MG/ML IJ SOLN
9.0000 mL | Freq: Once | INTRAMUSCULAR | Status: AC
  Administered 2015-11-24: 9 mL via EPIDURAL
  Filled 2015-11-24: qty 10

## 2015-11-24 MED ORDER — LACTATED RINGERS IV SOLN: 1000.0000 mL | Freq: Once | INTRAVENOUS | Status: DC

## 2015-11-24 MED ORDER — FENTANYL CITRATE (PF) 100 MCG/2ML IJ SOLN
25.0000 ug | INTRAMUSCULAR | Status: DC | PRN
  Filled 2015-11-24: qty 2

## 2015-11-24 NOTE — Progress Notes (Signed)
Safety precautions to be maintained throughout the outpatient stay will include: orient to surroundings, keep bed in low position, maintain call bell within reach at all times, provide assistance with transfer out of bed and ambulation.  

## 2015-11-24 NOTE — Progress Notes (Signed)
Patient's Name: Amanda Soto  Patient type: Established  MRN: [id number]  Service setting: Ambulatory outpatient  DOB: 1925/02/21  Location: [facility]  DOS: 11/24/2015  Primary Care Physician: [name], MD  Note by: Kathlen Brunswick. Dossie Arbour, M.D, DABA, DABAPM, DABPM, DABIPP, FIPP  Referring Physician: [name], MD  Specialty: Board-Certified Interventional Pain Management  Last Visit to Pain Management: 11/14/2015   Primary Reason(s) for Visit: Interventional Pain Management Treatment. CC: Back Pain  Primary Diagnosis: Facet syndrome, lumbar [M54.5]   Procedure:  Anesthesia, Analgesia, Anxiolysis:  Type: Diagnostic Medial Branch Facet Block Region: Lumbar Level: L2, L3, L4, L5, & S1 Medial Branch Level(s) Laterality: Right  Indications: 1. Lumbar facet syndrome (Location of Primary Source of Pain) (Right)   2. Lumbar spondylosis, unspecified spinal osteoarthritis   3. Chronic low back pain (Location of Primary Source of Pain) (Right)     Pre-procedure Pain Score: 10 over 10 Reported level of pain is compatible with clinical observations Post-procedure Pain Score: 0-No pain  Type: Moderate (Conscious) Sedation & Local Anesthesia Local Anesthetic: Lidocaine 1% Route: Intravenous (IV) IV Access: Secured Sedation: Meaningful verbal contact was maintained at all times during the procedure  Indication(s): Analgesia & Anxiolysis   Pre-Procedure Assessment:  Amanda Soto is a 82 y.o. year old, female patient, seen today for interventional treatment. She has Chronic systolic heart failure ([place]); Bradycardia; Atrial fibrillation ([place]); HTN (hypertension); Abdominal discomfort; Exertional shortness of breath; Elevated glucose; Hypothyroidism; Lightheadedness; Symptomatic anemia; Anemia; Anxiety; Degeneration of intervertebral disc of lumbar region; Recurrent major depressive disorder, in partial remission ([place]); Encounter for general adult medical  examination without abnormal findings; Cardiac murmur; H/O neoplasm; HLD (hyperlipidemia); Adult hypothyroidism; MI (mitral incompetence); OP (osteoporosis); Chronic pain; Chronic low back pain (Location of Primary Source of Pain) (Right); Lumbar facet syndrome (Location of Primary Source of Pain) (Right); Grade 1 Anterolisthesis of L4 over L5 (5 mm); Lumbar spondylosis; Scoliosis of lumbar spine (concave to right side); Lumbar facet arthropathy; Osteoarthritis of hip (Right); and Thoracic paracentral T7-8 disc protrusion on her problem list.. Her primarily concern today is the Back Pain   Pain Type: Chronic pain Pain Location: Back Pain Orientation: Lower Pain Descriptors / Indicators: Aching, Sharp Pain Frequency: Constant  Date of Last Visit: 11/14/15 Service Provided on Last Visit: Evaluation  Verification of the correct person, correct site (including marking of site), and correct procedure were performed and confirmed by the patient.  Consent: Secured. Under the influence of no sedatives a written informed consent was obtained, after having provided information on the risks and possible complications. To fulfill our ethical and legal obligations, as recommended by the American Medical Association's Code of Ethics, we have provided information to the patient about our clinical impression; the nature and purpose of the treatment or procedure; the risks, benefits, and possible complications of the intervention; alternatives; the risk(s) and benefit(s) of the alternative treatment(s) or procedure(s); and the risk(s) and benefit(s) of doing nothing. The patient was provided information about the risks and possible complications associated with the procedure. These include, but are not limited to, failure to achieve desired goals, infection, bleeding, organ or nerve damage, allergic reactions, paralysis, and death. In the case of spinal procedures these may include, but are not limited to, failure to  achieve desired goals, infection, bleeding, organ or nerve damage, allergic reactions, paralysis, and death. In addition, the patient was informed that Medicine is not an exact science; therefore, there is also the possibility of unforeseen risks and  possible complications that may result in a catastrophic outcome. The patient indicated having understood very clearly. We have given the patient no guarantees and we have made no promises. Enough time was given to the patient to ask questions, all of which were answered to the patient's satisfaction.  Consent Attestation: I, the ordering provider, attest that I have discussed with the patient the benefits, risks, side-effects, alternatives, likelihood of achieving goals, and potential problems during recovery for the procedure that I have provided informed consent.  Pre-Procedure Preparation: Safety Precautions: Allergies reviewed. Appropriate site, procedure, and patient were confirmed by following the Joint Commission's Universal Protocol (UP.01.01.01), in the form of a "Time Out". The patient was asked to confirm marked site and procedure, before commencing. The patient was asked about blood thinners, or active infections, both of which were denied. Patient was assessed for positional comfort and all pressure points were checked before starting procedure. Allergies: She is allergic to hydrocodone and ciprofloxacin.. Infection Control Precautions: Sterile technique used. Standard Universal Precautions were taken as recommended by the Department of [facility] for Disease Control and Prevention (CDC). Standard pre-surgical skin prep was conducted. Respiratory hygiene and cough etiquette was practiced. Hand hygiene observed. Safe injection practices and needle disposal techniques followed. SDV (single dose vial) medications used. Medications properly checked for expiration dates and contaminants. Personal protective equipment (PPE) used: Sterile  Radiation-resistant gloves. Monitoring:  As per clinic protocol. Filed Vitals:   11/24/15 1055 11/24/15 1100 11/24/15 1115 11/24/15 1125  BP: 125/92 105/66 121/60 96/61  Pulse: 88 89 86 85  Temp:   97.7 F (36.5 C)   TempSrc:   Temporal   Resp: 16 16 17 28   Height:      Weight:      SpO2: 100% 100% 92% 96%  Calculated BMI: Body mass index is 24.63 kg/(m^2).  Description of Procedure Process:   Time-out: "Time-out" completed before starting procedure, as per protocol. Position: Prone Target Area: For Lumbar Facet blocks, the target is the groove formed by the junction of the transverse process and superior articular process. For the L5 dorsal ramus, the target is the notch between superior articular process and sacral ala. For the S1 dorsal ramus, the target is the superior and lateral edge of the posterior S1 Sacral foramen. Approach: Paramedial approach. Area Prepped: Entire Posterior Lumbosacral Region Prepping solution: ChloraPrep (2% chlorhexidine gluconate and 70% isopropyl alcohol) Safety Precautions: Aspiration looking for blood return was conducted prior to all injections. At no point did we inject any substances, as a needle was being advanced. No attempts were made at seeking any paresthesias. Safe injection practices and needle disposal techniques used. Medications properly checked for expiration dates. SDV (single dose vial) medications used.   Description of the Procedure: Protocol guidelines were followed. The patient was placed in position over the fluoroscopy table. The target area was identified and the area prepped in the usual manner. Skin desensitized using vapocoolant spray. Skin & deeper tissues infiltrated with local anesthetic. Appropriate amount of time allowed to pass for local anesthetics to take effect. The procedure needle was introduced through the skin, ipsilateral to the reported pain, and advanced to the target area. Employing the "Medial Branch Technique", the  needles were advanced to the angle made by the superior and medial portion of the transverse process, and the lateral and inferior portion of the superior articulating process of the targeted vertebral bodies. This area is known as "Burton's Eye" or the "Eye of the [place] Dog". A  procedure needle was introduced through the skin, and this time advanced to the angle made by the superior and medial border of the sacral ala, and the lateral border of the S1 vertebral body. This last needle was later repositioned at the superior and lateral border of the posterior S1 foramen. Negative aspiration confirmed. Solution injected in intermittent fashion, asking for systemic symptoms every 0.5cc of injectate. The needles were then removed and the area cleansed, making sure to leave some of the prepping solution back to take advantage of its long term bactericidal properties. EBL: None Materials & Medications Used:  Needle(s) Used: 22g - 3.5" Spinal Needle(s)  Imaging Guidance:   Type of Imaging Technique: Fluoroscopy Guidance (Spinal) Indication(s): Assistance in needle guidance and placement for procedures requiring needle placement in or near specific anatomical locations not easily accessible without such assistance. Exposure Time: Please see nurses notes. Contrast: None required. Fluoroscopic Guidance: I was personally present in the fluoroscopy suite, where the patient was placed in position for the procedure, over the fluoroscopy-compatible table. Fluoroscopy was manipulated, using "Tunnel Vision Technique", to obtain the best possible view of the target area, on the affected side. Parallax error was corrected before commencing the procedure. A "direction-depth-direction" technique was used to introduce the needle under continuous pulsed fluoroscopic guidance. Once the target was reached, antero-posterior, oblique, and lateral fluoroscopic projection views were taken to confirm needle placement in all planes.  Permanently recorded images stored by scanning into EMR. Interpretation: Intraoperative imaging interpretation by performing Physician. Adequate needle placement confirmed. Adequate needle placement confirmed in AP, lateral, & Oblique Views. No contrast injected.  Antibiotic Prophylaxis:  Indication(s): No indications identified. Type:  Antibiotics Given (last 72 hours)    None       Post-operative Assessment:   Complications: No immediate post-treatment complications were observed. Disposition: Return to clinic for follow-up evaluation. The patient tolerated the entire procedure well. A repeat set of vitals were taken after the procedure and the patient was kept under observation following institutional policy, for this procedure. Post-procedural neurological assessment was performed, showing return to baseline, prior to discharge. The patient was discharged home, once institutional criteria were met. The patient was provided with post-procedure discharge instructions, including a section on how to identify potential problems. Should any problems arise concerning this procedure, the patient was given instructions to immediately contact us, at any time, without hesitation. In any case, we plan to contact the patient by telephone for a follow-up status report regarding this interventional procedure. Comments:  No additional relevant information.  Medications administered during this visit: We administered triamcinolone acetonide, lidocaine (PF), and ropivacaine (PF) 2 mg/ml (0.2%).  Prescriptions ordered during this visit: New Prescriptions   No medications on file    Requested PM Follow-up: Return for Post-Procedure Eval (2 weeks).  Future Appointments Date Time Provider [place]  12/08/2015 8:20 AM Milinda Pointer, MD [facility] None  12/21/2015 10:30 AM [name], MD [facility] None  01/11/2016 1:30 PM [name], MD [facility] None    Primary Care Physician:  [name], MD Location: [facility] Note by: Kathlen Brunswick. Dossie Arbour, M.D, DABA, DABAPM, DABPM, DABIPP, FIPP   Illustration of the posterior view of the lumbar spine and the posterior neural structures. Laminae of L2 through S1 are labeled. DPRL5, dorsal primary ramus of L5; DPRS1, dorsal primary ramus of S1; DPR3, dorsal primary ramus of L3; FJ, facet (zygapophyseal) joint L3-L4; I, inferior articular process of L4; LB1, lateral branch of dorsal primary  ramus of L1; IAB, inferior articular branches from L3 medial branch (supplies L4-L5 facet joint); IBP, intermediate branch plexus; MB3, medial branch of dorsal primary ramus of L3; NR3, third lumbar nerve root; S, superior articular process of L5; SAB, superior articular branches from L4 (supplies L4-5 facet joint also); TP3, transverse process of L3.  Disclaimer:  Medicine is not an Chief Strategy Officer. The only guarantee in medicine is that nothing is guaranteed. It is important to note that the decision to proceed with this intervention was based on the information collected from the patient. The Data and conclusions were drawn from the patient's questionnaire, the interview, and the physical examination. Because the information was provided in large part by the patient, it cannot be guaranteed that it has not been purposely or unconsciously manipulated. Every effort has been made to obtain as much relevant data as possible for this evaluation. It is important to note that the conclusions that lead to this procedure are derived in large part from the available data. Always take into account that the treatment will also be dependent on availability of resources and existing treatment guidelines, considered by other Pain Management Practitioners as being common knowledge and practice, at the time of the intervention. For Medico-Legal purposes, it is also important to point out that variation in procedural techniques and  pharmacological choices are the acceptable norm. The indications, contraindications, technique, and results of the above procedure should only be interpreted and judged by a Board-Certified Interventional Pain Specialist with extensive familiarity and expertise in the same exact procedure and technique. Attempts at providing opinions without similar or greater experience and expertise than that of the treating physician will be considered as inappropriate and unethical, and shall result in a formal complaint to the state medical board and applicable specialty societies.

## 2015-11-24 NOTE — Progress Notes (Signed)
0.5 cc of versed given, fent 23mcg pre procedure

## 2015-11-24 NOTE — Patient Instructions (Signed)
Pain Management Discharge Instructions  General Discharge Instructions :  If you need to reach your doctor call: Monday-Friday 8:00 am - 4:00 pm at [phone] or toll free [phone].  After clinic hours [phone] to have operator reach doctor.  Bring all of your medication bottles to all your appointments in the pain clinic.  To cancel or reschedule your appointment with Pain Management please remember to call 24 hours in advance to avoid a fee.  Refer to the educational materials which you have been given on: General Risks, I had my Procedure. Discharge Instructions, Post Sedation.  Post Procedure Instructions:  The drugs you were given will stay in your system until tomorrow, so for the next 24 hours you should not drive, make any legal decisions or drink any alcoholic beverages.  You may eat anything you prefer, but it is better to start with liquids then soups and crackers, and gradually work up to solid foods.  Please notify your doctor immediately if you have any unusual bleeding, trouble breathing or pain that is not related to your normal pain.  Depending on the type of procedure that was done, some parts of your body may feel week and/or numb.  This usually clears up by tonight or the next day.  Walk with the use of an assistive device or accompanied by an adult for the 24 hours.  You may use ice on the affected area for the first 24 hours.  Put ice in a Ziploc bag and cover with a towel and place against area 15 minutes on 15 minutes off.  You may switch to heat after 24 hours.GENERAL RISKS AND COMPLICATIONS  What are the risk, side effects and possible complications? Generally speaking, most procedures are safe.  However, with any procedure there are risks, side effects, and the possibility of complications.  The risks and complications are dependent upon the sites that are lesioned, or the type of nerve block to be performed.  The closer the procedure is to the spine,  the more serious the risks are.  Great care is taken when placing the radio frequency needles, block needles or lesioning probes, but sometimes complications can occur. 1. Infection: Any time there is an injection through the skin, there is a risk of infection.  This is why sterile conditions are used for these blocks.  There are four possible types of infection. 1. Localized skin infection. 2. Central Nervous System Infection-This can be in the form of Meningitis, which can be deadly. 3. Epidural Infections-This can be in the form of an epidural abscess, which can cause pressure inside of the spine, causing compression of the spinal cord with subsequent paralysis. This would require an emergency surgery to decompress, and there are no guarantees that the patient would recover from the paralysis. 4. Discitis-This is an infection of the intervertebral discs.  It occurs in about 1% of discography procedures.  It is difficult to treat and it may lead to surgery.        2. Pain: the needles have to go through skin and soft tissues, will cause soreness.       3. Damage to internal structures:  The nerves to be lesioned may be near blood vessels or    other nerves which can be potentially damaged.       4. Bleeding: Bleeding is more common if the patient is taking blood thinners such as  aspirin, Coumadin, Ticiid, Plavix, etc., or if he/she have some genetic predisposition  such as   hemophilia. Bleeding into the spinal canal can cause compression of the spinal  cord with subsequent paralysis.  This would require an emergency surgery to  decompress and there are no guarantees that the patient would recover from the  paralysis.       5. Pneumothorax:  Puncturing of a lung is a possibility, every time a needle is introduced in  the area of the chest or upper back.  Pneumothorax refers to free air around the  collapsed lung(s), inside of the thoracic cavity (chest cavity).  Another two possible  complications  related to a similar event would include: Hemothorax and Chylothorax.   These are variations of the Pneumothorax, where instead of air around the collapsed  lung(s), you may have blood or chyle, respectively.       6. Spinal headaches: They may occur with any procedures in the area of the spine.       7. Persistent CSF (Cerebro-Spinal Fluid) leakage: This is a rare problem, but may occur  with prolonged intrathecal or epidural catheters either due to the formation of a fistulous  track or a dural tear.       8. Nerve damage: By working so close to the spinal cord, there is always a possibility of  nerve damage, which could be as serious as a permanent spinal cord injury with  paralysis.       9. Death:  Although rare, severe deadly allergic reactions known as "Anaphylactic  reaction" can occur to any of the medications used.      10. Worsening of the symptoms:  We can always make thing worse.  What are the chances of something like this happening? Chances of any of this occuring are extremely low.  By statistics, you have more of a chance of getting killed in a motor vehicle accident: while driving to the hospital than any of the above occurring .  Nevertheless, you should be aware that they are possibilities.  In general, it is similar to taking a shower.  Everybody knows that you can slip, hit your head and get killed.  Does that mean that you should not shower again?  Nevertheless always keep in mind that statistics do not mean anything if you happen to be on the wrong side of them.  Even if a procedure has a 1 (one) in a 1,000,000 (million) chance of going wrong, it you happen to be that one..Also, keep in mind that by statistics, you have more of a chance of having something go wrong when taking medications.  Who should not have this procedure? If you are on a blood thinning medication (e.g. Coumadin, Plavix, see list of "Blood Thinners"), or if you have an active infection going on, you should not  have the procedure.  If you are taking any blood thinners, please inform your physician.  How should I prepare for this procedure?  Do not eat or drink anything at least six hours prior to the procedure.  Bring a driver with you .  It cannot be a taxi.  Come accompanied by an adult that can drive you back, and that is strong enough to help you if your legs get weak or numb from the local anesthetic.  Take all of your medicines the morning of the procedure with just enough water to swallow them.  If you have diabetes, make sure that you are scheduled to have your procedure done first thing in the morning, whenever possible.  If you have diabetes,   take only half of your insulin dose and notify our nurse that you have done so as soon as you arrive at the clinic.  If you are diabetic, but only take blood sugar pills (oral hypoglycemic), then do not take them on the morning of your procedure.  You may take them after you have had the procedure.  Do not take aspirin or any aspirin-containing medications, at least eleven (11) days prior to the procedure.  They may prolong bleeding.  Wear loose fitting clothing that may be easy to take off and that you would not mind if it got stained with Betadine or blood.  Do not wear any jewelry or perfume  Remove any nail coloring.  It will interfere with some of our monitoring equipment.  NOTE: Remember that this is not meant to be interpreted as a complete list of all possible complications.  Unforeseen problems may occur.  BLOOD THINNERS The following drugs contain aspirin or other products, which can cause increased bleeding during surgery and should not be taken for 2 weeks prior to and 1 week after surgery.  If you should need take something for relief of minor pain, you may take acetaminophen which is found in Tylenol,m Datril, Anacin-3 and Panadol. It is not blood thinner. The products listed below are.  Do not take any of the products listed below  in addition to any listed on your instruction sheet.  A.P.C or A.P.C with Codeine Codeine Phosphate Capsules #3 Ibuprofen Ridaura  ABC compound Congesprin Imuran rimadil  Advil Cope Indocin Robaxisal  Alka-Seltzer Effervescent Pain Reliever and Antacid Coricidin or Coricidin-D  Indomethacin Rufen  Alka-Seltzer plus Cold Medicine Cosprin Ketoprofen S-A-C Tablets  Anacin Analgesic Tablets or Capsules Coumadin Korlgesic Salflex  Anacin Extra Strength Analgesic tablets or capsules CP-2 Tablets Lanoril Salicylate  Anaprox Cuprimine Capsules Levenox Salocol  Anexsia-D Dalteparin Magan Salsalate  Anodynos Darvon compound Magnesium Salicylate Sine-off  Ansaid Dasin Capsules Magsal Sodium Salicylate  Anturane Depen Capsules Marnal Soma  APF Arthritis pain formula Dewitt's Pills Measurin Stanback  Argesic Dia-Gesic Meclofenamic Sulfinpyrazone  Arthritis Bayer Timed Release Aspirin Diclofenac Meclomen Sulindac  Arthritis pain formula Anacin Dicumarol Medipren Supac  Analgesic (Safety coated) Arthralgen Diffunasal Mefanamic Suprofen  Arthritis Strength Bufferin Dihydrocodeine Mepro Compound Suprol  Arthropan liquid Dopirydamole Methcarbomol with Aspirin Synalgos  ASA tablets/Enseals Disalcid Micrainin Tagament  Ascriptin Doan's Midol Talwin  Ascriptin A/D Dolene Mobidin Tanderil  Ascriptin Extra Strength Dolobid Moblgesic Ticlid  Ascriptin with Codeine Doloprin or Doloprin with Codeine Momentum Tolectin  Asperbuf Duoprin Mono-gesic Trendar  Aspergum Duradyne Motrin or Motrin IB Triminicin  Aspirin plain, buffered or enteric coated Durasal Myochrisine Trigesic  Aspirin Suppositories Easprin Nalfon Trillsate  Aspirin with Codeine Ecotrin Regular or Extra Strength Naprosyn Uracel  Atromid-S Efficin Naproxen Ursinus  Auranofin Capsules Elmiron Neocylate Vanquish  Axotal Emagrin Norgesic Verin  Azathioprine Empirin or Empirin with Codeine Normiflo Vitamin E  Azolid Emprazil Nuprin Voltaren  Bayer  Aspirin plain, buffered or children's or timed BC Tablets or powders Encaprin Orgaran Warfarin Sodium  Buff-a-Comp Enoxaparin Orudis Zorpin  Buff-a-Comp with Codeine Equegesic Os-Cal-Gesic   Buffaprin Excedrin plain, buffered or Extra Strength Oxalid   Bufferin Arthritis Strength Feldene Oxphenbutazone   Bufferin plain or Extra Strength Feldene Capsules Oxycodone with Aspirin   Bufferin with Codeine Fenoprofen Fenoprofen Pabalate or Pabalate-SF   Buffets II Flogesic Panagesic   Buffinol plain or Extra Strength Florinal or Florinal with Codeine Panwarfarin   Buf-Tabs Flurbiprofen Penicillamine   Butalbital Compound Four-way cold tablets   Penicillin   Butazolidin Fragmin Pepto-Bismol   Carbenicillin Geminisyn Percodan   Carna Arthritis Reliever Geopen Persantine   Carprofen Gold's salt Persistin   Chloramphenicol Goody's Phenylbutazone   Chloromycetin Haltrain Piroxlcam   Clmetidine heparin Plaquenil   Cllnoril Hyco-pap Ponstel   Clofibrate Hydroxy chloroquine Propoxyphen         Before stopping any of these medications, be sure to consult the physician who ordered them.  Some, such as Coumadin (Warfarin) are ordered to prevent or treat serious conditions such as "deep thrombosis", "pumonary embolisms", and other heart problems.  The amount of time that you may need off of the medication may also vary with the medication and the reason for which you were taking it.  If you are taking any of these medications, please make sure you notify your pain physician before you undergo any procedures.    Pain diary given to patient

## 2015-11-25 ENCOUNTER — Telehealth: Payer: Self-pay

## 2015-11-25 NOTE — Telephone Encounter (Signed)
Denies any needs at this time- instructed to call if needed

## 2015-11-29 ENCOUNTER — Other Ambulatory Visit: Payer: Self-pay | Admitting: Gastroenterology

## 2015-11-29 DIAGNOSIS — R1013 Epigastric pain: Secondary | ICD-10-CM | POA: Diagnosis not present

## 2015-11-29 DIAGNOSIS — D649 Anemia, unspecified: Secondary | ICD-10-CM | POA: Diagnosis not present

## 2015-11-29 DIAGNOSIS — Z86010 Personal history of colonic polyps: Secondary | ICD-10-CM | POA: Diagnosis not present

## 2015-12-07 ENCOUNTER — Ambulatory Visit
Admission: RE | Admit: 2015-12-07 | Discharge: 2015-12-07 | Disposition: A | Discharge status: Home / Self Care | Payer: Medicare Other | Source: Ambulatory Visit | Attending: Gastroenterology | Admitting: Gastroenterology

## 2015-12-07 DIAGNOSIS — R1013 Epigastric pain: Secondary | ICD-10-CM | POA: Diagnosis not present

## 2015-12-07 DIAGNOSIS — K219 Gastro-esophageal reflux disease without esophagitis: Secondary | ICD-10-CM | POA: Insufficient documentation

## 2015-12-08 ENCOUNTER — Ambulatory Visit: Payer: Medicare Other | Attending: Pain Medicine | Admitting: Pain Medicine

## 2015-12-08 ENCOUNTER — Encounter: Payer: Self-pay | Admitting: Pain Medicine

## 2015-12-08 VITALS — BP 113/44 | HR 44 | Temp 98.1°F | Resp 16 | Ht 59.0 in | Wt 117.0 lb

## 2015-12-08 DIAGNOSIS — D649 Anemia, unspecified: Secondary | ICD-10-CM | POA: Diagnosis not present

## 2015-12-08 DIAGNOSIS — F339 Major depressive disorder, recurrent, unspecified: Secondary | ICD-10-CM | POA: Insufficient documentation

## 2015-12-08 DIAGNOSIS — M1990 Unspecified osteoarthritis, unspecified site: Secondary | ICD-10-CM | POA: Diagnosis not present

## 2015-12-08 DIAGNOSIS — M5124 Other intervertebral disc displacement, thoracic region: Secondary | ICD-10-CM | POA: Diagnosis not present

## 2015-12-08 DIAGNOSIS — F419 Anxiety disorder, unspecified: Secondary | ICD-10-CM | POA: Diagnosis not present

## 2015-12-08 DIAGNOSIS — M545 Low back pain: Secondary | ICD-10-CM | POA: Insufficient documentation

## 2015-12-08 DIAGNOSIS — K219 Gastro-esophageal reflux disease without esophagitis: Secondary | ICD-10-CM | POA: Insufficient documentation

## 2015-12-08 DIAGNOSIS — E785 Hyperlipidemia, unspecified: Secondary | ICD-10-CM | POA: Diagnosis not present

## 2015-12-08 DIAGNOSIS — I4891 Unspecified atrial fibrillation: Secondary | ICD-10-CM | POA: Insufficient documentation

## 2015-12-08 DIAGNOSIS — M5136 Other intervertebral disc degeneration, lumbar region: Secondary | ICD-10-CM | POA: Insufficient documentation

## 2015-12-08 DIAGNOSIS — I5022 Chronic systolic (congestive) heart failure: Secondary | ICD-10-CM | POA: Diagnosis not present

## 2015-12-08 DIAGNOSIS — I340 Nonrheumatic mitral (valve) insufficiency: Secondary | ICD-10-CM | POA: Diagnosis not present

## 2015-12-08 DIAGNOSIS — M47816 Spondylosis without myelopathy or radiculopathy, lumbar region: Secondary | ICD-10-CM | POA: Insufficient documentation

## 2015-12-08 DIAGNOSIS — I10 Essential (primary) hypertension: Secondary | ICD-10-CM | POA: Diagnosis not present

## 2015-12-08 DIAGNOSIS — Q762 Congenital spondylolisthesis: Secondary | ICD-10-CM

## 2015-12-08 DIAGNOSIS — M4316 Spondylolisthesis, lumbar region: Secondary | ICD-10-CM | POA: Diagnosis not present

## 2015-12-08 DIAGNOSIS — M1611 Unilateral primary osteoarthritis, right hip: Secondary | ICD-10-CM | POA: Diagnosis not present

## 2015-12-08 DIAGNOSIS — M4310 Spondylolisthesis, site unspecified: Secondary | ICD-10-CM

## 2015-12-08 DIAGNOSIS — M810 Age-related osteoporosis without current pathological fracture: Secondary | ICD-10-CM | POA: Diagnosis not present

## 2015-12-08 DIAGNOSIS — G8929 Other chronic pain: Secondary | ICD-10-CM | POA: Insufficient documentation

## 2015-12-08 DIAGNOSIS — R001 Bradycardia, unspecified: Secondary | ICD-10-CM | POA: Diagnosis not present

## 2015-12-08 DIAGNOSIS — M549 Dorsalgia, unspecified: Secondary | ICD-10-CM | POA: Diagnosis present

## 2015-12-08 DIAGNOSIS — E039 Hypothyroidism, unspecified: Secondary | ICD-10-CM | POA: Diagnosis not present

## 2015-12-08 DIAGNOSIS — M1288 Other specific arthropathies, not elsewhere classified, other specified site: Secondary | ICD-10-CM | POA: Diagnosis not present

## 2015-12-08 DIAGNOSIS — Z7901 Long term (current) use of anticoagulants: Secondary | ICD-10-CM | POA: Insufficient documentation

## 2015-12-08 NOTE — Progress Notes (Signed)
Patient's Name: Amanda Soto  Patient type: Established  MRN: [id number]  Service setting: Ambulatory outpatient  DOB: 1924/09/22  Location: [facility]  DOS: 12/08/2015  Primary Care Physician: [name], MD  Note by: Kathlen Brunswick. Dossie Arbour, M.D, DABA, DABAPM, DABPM, DABIPP, FIPP  Referring Physician: [name], MD  Specialty: Board-Certified Interventional Pain Management  Last Visit to Pain Management: 11/25/2015   Primary Reason(s) for Visit: Encounter for post-procedure evaluation of chronic illness with mild to moderate exacerbation CC: Back Pain   HPI  Ms. Mowdy is a 82 y.o. year old, female patient, who returns today as an established patient. She has Chronic systolic heart failure ([place]); Bradycardia; Atrial fibrillation ([place]); HTN (hypertension); Abdominal discomfort; Exertional shortness of breath; Elevated glucose; Hypothyroidism; Lightheadedness; Symptomatic anemia; Anemia; Anxiety; Degeneration of intervertebral disc of lumbar region; Recurrent major depressive disorder, in partial remission ([place]); Encounter for general adult medical examination without abnormal findings; Cardiac murmur; H/O neoplasm; HLD (hyperlipidemia); Adult hypothyroidism; MI (mitral incompetence); OP (osteoporosis); Chronic pain; Chronic low back pain (Location of Primary Source of Pain) (Right); Lumbar facet syndrome (Location of Primary Source of Pain) (Right); Grade 1 Anterolisthesis of L4 over L5 (5 mm); Lumbar spondylosis; Scoliosis of lumbar spine (concave to right side); Lumbar facet arthropathy; Osteoarthritis of hip (Right); and Thoracic paracentral T7-8 disc protrusion on her problem list.. Her primarily concern today is the Back Pain   Pain Assessment: Self-Reported Pain Score: 5  Reported level is compatible with observation Pain Type: Chronic pain Pain Location: Back Pain Orientation: Lower Pain Descriptors / Indicators: Aching, Sharp Pain Frequency:  Constant  The patient comes into the clinics today for post-procedure evaluation on the interventional treatment done on 11/24/2015.  Date of Last Visit: 11/24/15 Service Provided on Last Visit: Procedure (Right Lumbar facet)  Post-Procedure Assessment  Procedure done on last visit: Diagnostic right-sided lumbar facet block under fluoroscopic guidance and IV sedation #1 Side-effects or Adverse reactions: None reported Sedation: Please see nurses note  Results: Ultra-Short Term Relief (First 1 hour after procedure): 100 %  Analgesia during this period is likely to be Local Anesthetic and/or IV Sedative (Analgesic/Anxiolitic) related Short Term Relief (Initial 4-6 hrs after procedure): 100 % Complete relief confirms area to be the source of pain Long Term Relief : 0 % No benefit could suggest etiology to be non-inflammatory, possibly compressive   Current Relief (Now): 0%  Recurrance of pain could suggest persistent aggravating factors Interpretation of Results: The results of this diagnostic procedure with suggest that the facet joints are related directly to the etiology of the pain and therefore we will repeat the procedure to see if she may get some benefit by a second dose of steroids, if she doesn't, then we will move on to radiofrequency.  Laboratory Chemistry  Inflammation Markers No results found for: ESRSEDRATE, CRP  Renal Function Lab Results  Component Value Date   BUN 14 11/10/2015   CREATININE 0.67 11/10/2015   GFRAA >60 11/10/2015   GFRNONAA >60 11/10/2015    Hepatic Function Lab Results  Component Value Date   AST 25 08/25/2015   ALT 14 08/25/2015   ALBUMIN 3.9 08/25/2015    Electrolytes Lab Results  Component Value Date   NA 135 11/10/2015   K 3.6 11/10/2015   CL 102 11/10/2015   CALCIUM 8.4* 11/10/2015   MG 2.1 11/10/2015    Pain Modulating Vitamins Lab Results  Component Value Date   VITAMINB12 227 11/10/2015    Coagulation Parameters  Lab  Results  Component Value Date   INR 1.3 02/05/2014   LABPROT 15.5* 02/05/2014   APTT < 23.0* 02/05/2014   PLT 375.0 11/14/2015    Note: Labs Reviewed.  Recent Diagnostic Imaging  Dg Ugi W/small Bowel  12/07/2015  CLINICAL DATA:  Recent blood transfusion; ? Bleeding. EXAM: UPPER GI SERIES WITH SMALL BOWEL FOLLOW-THROUGH FLUOROSCOPY TIME:  Radiation Exposure Index (as provided by the fluoroscopic device): 15.4 mGy TECHNIQUE: Combined double contrast and single contrast upper GI series using effervescent crystals, thick barium, and thin barium. Subsequently, serial images of the small bowel were obtained including spot views of the terminal ileum. COMPARISON:  None. FINDINGS: Examination of the esophagus demonstrated normal esophageal motility. Normal esophageal morphology without evidence of esophagitis or ulceration. No esophageal stricture, diverticula, or mass lesion. No evidence of hiatal hernia. There is mild gastroesophageal reflux. Examination of the stomach demonstrated normal rugal folds and areae gastricae. The gastric mucosa appeared unremarkable without evidence of ulceration, scarring, or mass lesion. Gastric motility and emptying was normal. Fluoroscopic examination of the duodenum demonstrates normal motility and morphology without evidence of ulceration or mass lesion. Medium density barium was periodically observed under fluoroscopy to travel from the stomach to the ascending colon (over a 150 minute time period). There is no evidence of small bowel stricture or obstruction. No large filling defects to suggest mass lesion. In addition, there is no evidence of tethering or definite inflammatory changes present within the small bowel. IMPRESSION: 1. Mild gastroesophageal reflux. 2. Otherwise normal upper GI and small-bowel follow-through. Electronically Signed   By: Kathreen Devoid   On: 12/07/2015 12:33    Meds  The patient has a current medication list which includes the following  prescription(s): acetaminophen, amiodarone, apixaban, atorvastatin, iron polysaccharides, synthroid, and tramadol.  Current Outpatient Prescriptions on File Prior to Visit  Medication Sig  . acetaminophen (TYLENOL) 325 MG tablet Take 650 mg by mouth every 6 (six) hours as needed for moderate pain, fever or headache.  Marland Kitchen amiodarone (PACERONE) 100 MG tablet Take 100 mg by mouth daily.  Marland Kitchen apixaban (ELIQUIS) 2.5 MG TABS tablet Take 2.5 mg by mouth 2 (two) times daily.  Marland Kitchen atorvastatin (LIPITOR) 10 MG tablet Take 10 mg by mouth daily.  . iron polysaccharides (NIFEREX) 150 MG capsule Take 1 capsule (150 mg total) by mouth daily.  Marland Kitchen SYNTHROID 75 MCG tablet Take 75 mcg by mouth daily.  . traMADol (ULTRAM-ER) 200 MG 24 hr tablet Take 1 tablet (200 mg total) by mouth daily.   No current facility-administered medications on file prior to visit.    ROS  Constitutional: Denies any fever or chills Gastrointestinal: No reported hemesis, hematochezia, vomiting, or acute GI distress Musculoskeletal: Denies any acute onset joint swelling, redness, loss of ROM, or weakness Neurological: No reported episodes of acute onset apraxia, aphasia, dysarthria, agnosia, amnesia, paralysis, loss of coordination, or loss of consciousness  Allergies  Ms. Mctee is allergic to hydrocodone and ciprofloxacin.  [place]  Medical:  Ms. Mccommon  has a past medical history of CHF (congestive heart failure) ([place]); Hypertension; Hyperlipidemia; Atrial fibrillation ([place]); Hypothyroid; Arthritis; Depression; Skin cancer; Chickenpox; Diverticulitis; GERD (gastroesophageal reflux disease); Heart murmur; Chronic abdominal pain (01/16/2014); Neuritis or radiculitis due to rupture of lumbar intervertebral disc (11/18/2013); Trochanteric bursitis of right hip (11/18/2013); Degenerative arthritis of lumbar spine (11/18/2013); and Degenerative arthritis of hip (08/19/2014). Family: family history includes Breast cancer in her mother and sister; Stroke  in her maternal grandmother. Surgical:  has past surgical  history that includes Cataract extraction; Partial hysterectomy; Appendectomy; and Tonsillectomy. Tobacco:  reports that she has never smoked. She has never used smokeless tobacco. Alcohol:  reports that she does not drink alcohol. Drug:  reports that she does not use illicit drugs.  Constitutional Exam  Vitals: Blood pressure 113/44, pulse 44, temperature 98.1 F (36.7 C), resp. rate 16, height 4\' 11"  (1.499 m), weight 117 lb (53.071 kg), SpO2 96 %. General appearance: Well nourished, well developed, and well hydrated. In no acute distress Calculated BMI/Body habitus: Body mass index is 23.62 kg/(m^2). (18.5-24.9 kg/m2) Ideal body weight Psych/Mental status: Alert and oriented x 3 (person, place, & time) Eyes: PERLA Respiratory: No evidence of acute respiratory distress  Cervical Spine Exam  Inspection: No masses, redness, or swelling Alignment: Symmetrical ROM: Functional: ROM is within functional limits [facility]) Stability: No instability detected Muscle strength & Tone: Functionally intact Sensory: Unimpaired Palpation: No complaints of tenderness  Upper Extremity (UE) Exam    Side: Right upper extremity  Side: Left upper extremity  Inspection: No masses, redness, swelling, or asymmetry  Inspection: No masses, redness, swelling, or asymmetry  ROM:  ROM:  Functional: ROM is within functional limits [facility])  Functional: ROM is within functional limits [facility])  Muscle strength & Tone: Functionally intact  Muscle strength & Tone: Functionally intact  Sensory: Unimpaired  Sensory: Unimpaired  Palpation: Non-contributory  Palpation: Non-contributory   Thoracic Spine Exam  Inspection: No masses, redness, or swelling Alignment: Symmetrical ROM: Functional: ROM is within functional limits [facility]) Stability: No instability detected Sensory: Unimpaired Muscle strength & Tone: Functionally intact Palpation: No complaints of  tenderness  Lumbar Spine Exam  Inspection: No masses, redness, or swelling Alignment: Symmetrical ROM: Functional: Decreased ROM Stability: No instability detected Muscle strength & Tone: Functionally intact Sensory: Unimpaired Palpation: Tender Provocative Tests: Lumbar Hyperextension and rotation test: Positive for right-sided lumbar facet pain Patrick's Maneuver: deferred  Gait & Posture Assessment  Ambulation: Patient ambulates using a wheel chair Gait: Antalgic gait (limping) Posture: Antalgic  Lower Extremity Exam    Side: Right lower extremity  Side: Left lower extremity  Inspection: No masses, redness, swelling, or asymmetry ROM:  Inspection: No masses, redness, swelling, or asymmetry ROM:  Functional: Diminished ROM for knees and hips   Functional: Diminished ROM for knees and hip   Muscle strength & Tone: Functionally intact  Muscle strength & Tone: Functionally intact  Sensory: Unimpaired  Sensory: Unimpaired  Palpation: Non-contributory  Palpation: Non-contributory   Assessment & Plan  Primary Diagnosis & Pertinent Problem List: The primary encounter diagnosis was Lumbar facet syndrome (Location of Primary Source of Pain) (Right). Diagnoses of Lumbar facet arthropathy, Lumbar spondylosis, unspecified spinal osteoarthritis, and Grade 1 Anterolisthesis of L4 over L5 (5 mm) were also pertinent to this visit.  Visit Diagnosis: 1. Lumbar facet syndrome (Location of Primary Source of Pain) (Right)   2. Lumbar facet arthropathy   3. Lumbar spondylosis, unspecified spinal osteoarthritis   4. Grade 1 Anterolisthesis of L4 over L5 (5 mm)     Problem-specific Plan(s): No problem-specific assessment & plan notes found for this encounter.   Plan of Care   Problem List Items Addressed This Visit      High   Grade 1 Anterolisthesis of L4 over L5 (5 mm) (Chronic)   Relevant Orders   LUMBAR FACET(MEDIAL BRANCH NERVE BLOCK) MBNB   Lumbar facet arthropathy (Chronic)    Relevant Orders   LUMBAR FACET(MEDIAL BRANCH NERVE BLOCK) MBNB   Lumbar facet syndrome (Location of  Primary Source of Pain) (Right) - Primary (Chronic)   Relevant Orders   LUMBAR FACET(MEDIAL BRANCH NERVE BLOCK) MBNB   Lumbar spondylosis (Chronic)   Relevant Orders   LUMBAR FACET(MEDIAL BRANCH NERVE BLOCK) MBNB       Pharmacotherapy (Medications Ordered): No orders of the defined types were placed in this encounter.    Lab-work & Procedure Ordered: Orders Placed This Encounter  Procedures  . LUMBAR FACET(MEDIAL BRANCH NERVE BLOCK) MBNB    Standing Status: Future     Number of Occurrences:      Standing Expiration Date: 12/07/2016    Scheduling Instructions:     Side: Right-sided     Level: L2, L3, L4, L5, & S1 Medial Branch Nerve     Sedation: With Sedation.     Timeframe: ASAA    Order Specific Question:  Where will this procedure be performed?    Answer:  [facility]    Imaging Ordered: None  Interventional Therapies: Scheduled:  Diagnostic right-sided lumbar facet block #2 under fluoroscopic guidance and IV sedation.    Considering:  Right-sided lumbar facet radiofrequency ablation.    PRN Procedures:  None at this time.    Referral(s) or Consult(s): None at this time.  Medications administered during this visit: Ms. Baumeister had no medications administered during this visit.  Requested PM Follow-up: Return for Procedure (Scheduled).  Future Appointments Date Time Provider [place]  12/21/2015 10:30 AM [name], MD [facility] None  12/22/2015 12:30 PM Milinda Pointer, MD [facility] None  01/11/2016 1:30 PM [name], MD [facility] None    Primary Care Physician: [name], MD Location: [facility] Note by: Kathlen Brunswick. Dossie Arbour, M.D, DABA, DABAPM, DABPM, DABIPP, FIPP  Pain Score Disclaimer: We use the NRS-11 scale. This is a self-reported, subjective measurement of pain severity with only modest  accuracy. It is used primarily to identify changes within a particular patient. It must be understood that outpatient pain scales are significantly less accurate that those used for research, where they can be applied under ideal controlled circumstances with minimal exposure to variables. In reality, the score is likely to be a combination of pain intensity and pain affect, where pain affect describes the degree of emotional arousal or changes in action readiness caused by the sensory experience of pain. Factors such as social and work situation, setting, emotional state, anxiety levels, expectation, and prior pain experience may influence pain perception and show large inter-individual differences that may also be affected by time variables.  Patient instructions provided during this appointment: Patient Instructions   Radiofrequency Lesioning Radiofrequency lesioning is a procedure that is performed to relieve pain. The procedure is often used for back, neck, or arm pain. Radiofrequency lesioning involves the use of a machine that creates radio waves to make heat. During the procedure, the heat is applied to the nerve that carries the pain signal. The heat damages the nerve and interferes with the pain signal. Pain relief usually lasts for 6 months to 1 year. LET [facility] CARE PROVIDER KNOW ABOUT:  Any allergies you have.  All medicines you are taking, including vitamins, herbs, eye drops, creams, and over-the-counter medicines.  Previous problems you or members of your family have had with the use of anesthetics.  Any blood disorders you have.  Previous surgeries you have had.  Any medical conditions you have.  Whether you are pregnant or may be pregnant. RISKS AND COMPLICATIONS Generally, this is a safe procedure. However,  problems may occur, including:  Pain or soreness at the injection site.  Infection at the injection site.  Damage to nerves or blood vessels. BEFORE THE  PROCEDURE  Ask your health care provider about:  Changing or stopping your regular medicines. This is especially important if you are taking diabetes medicines or blood thinners.  Taking medicines such as aspirin and ibuprofen. These medicines can thin your blood. Do not take these medicines before your procedure if your health care provider instructs you not to.  Follow instructions from your health care provider about eating or drinking restrictions.  Plan to have someone take you home after the procedure.  If you go home right after the procedure, plan to have someone with you for 24 hours. PROCEDURE  You will be given one or more of the following:  A medicine to help you relax (sedative).  A medicine to numb the area (local anesthetic).  You will be awake during the procedure. You will need to be able to talk with the health care provider during the procedure.  With the help of a type of X-ray (fluoroscopy), the health care provider will insert a radiofrequency needle into the area to be treated.  Next, a wire that carries the radio waves (electrode) will be put through the radiofrequency needle. An electrical pulse will be sent through the electrode to verify the correct nerve. You will feel a tingling sensation, and you may have muscle twitching.  Then, the tissue that is around the needle tip will be heated by an electric current that is passed using the radiofrequency machine. This will numb the nerves.  A bandage (dressing) will be put on the insertion area after the procedure is done. The procedure may vary among health care providers and hospitals. AFTER THE PROCEDURE  Your blood pressure, heart rate, breathing rate, and blood oxygen level will be monitored often until the medicines you were given have worn off.  Return to your normal activities as directed by your health care provider.   This information is not intended to replace advice given to you by your health  care provider. Make sure you discuss any questions you have with your health care provider.   Document Released: 02/14/2011 Document Revised: 03/09/2015 Document Reviewed: 07/26/2014 Elsevier Interactive Patient Education 2016 [place]  What are the risk, side effects and possible complications? Generally speaking, most procedures are safe.  However, with any procedure there are risks, side effects, and the possibility of complications.  The risks and complications are dependent upon the sites that are lesioned, or the type of nerve block to be performed.  The closer the procedure is to the spine, the more serious the risks are.  Great care is taken when placing the radio frequency needles, block needles or lesioning probes, but sometimes complications can occur.  Infection: Any time there is an injection through the skin, there is a risk of infection.  This is why sterile conditions are used for these blocks.  There are four possible types of infection.  Localized skin infection.  Central Nervous System Infection-This can be in the form of Meningitis, which can be deadly.  Epidural Infections-This can be in the form of an epidural abscess, which can cause pressure inside of the spine, causing compression of the spinal cord with subsequent paralysis. This would require an emergency surgery to decompress, and there are no guarantees that the patient would recover from the paralysis.  Discitis-This is an infection  of the intervertebral discs.  It occurs in about 1% of discography procedures.  It is difficult to treat and it may lead to surgery.        2. Pain: the needles have to go through skin and soft tissues, will cause soreness.       3. Damage to internal structures:  The nerves to be lesioned may be near blood vessels or    other nerves which can be potentially damaged.       4. Bleeding: Bleeding is more common if the patient is taking blood thinners  such as  aspirin, Coumadin, Ticiid, Plavix, etc., or if he/she have some genetic predisposition  such as hemophilia. Bleeding into the spinal canal can cause compression of the spinal  cord with subsequent paralysis.  This would require an emergency surgery to  decompress and there are no guarantees that the patient would recover from the  paralysis.       5. Pneumothorax:  Puncturing of a lung is a possibility, every time a needle is introduced in  the area of the chest or upper back.  Pneumothorax refers to free air around the  collapsed lung(s), inside of the thoracic cavity (chest cavity).  Another two possible  complications related to a similar event would include: Hemothorax and Chylothorax.   These are variations of the Pneumothorax, where instead of air around the collapsed  lung(s), you may have blood or chyle, respectively.       6. Spinal headaches: They may occur with any procedures in the area of the spine.       7. Persistent CSF (Cerebro-Spinal Fluid) leakage: This is a rare problem, but may occur  with prolonged intrathecal or epidural catheters either due to the formation of a fistulous  track or a dural tear.       8. Nerve damage: By working so close to the spinal cord, there is always a possibility of  nerve damage, which could be as serious as a permanent spinal cord injury with  paralysis.       9. Death:  Although rare, severe deadly allergic reactions known as "Anaphylactic  reaction" can occur to any of the medications used.      10. Worsening of the symptoms:  We can always make thing worse.  What are the chances of something like this happening? Chances of any of this occuring are extremely low.  By statistics, you have more of a chance of getting killed in a motor vehicle accident: while driving to the hospital than any of the above occurring .  Nevertheless, you should be aware that they are possibilities.  In general, it is similar to taking a shower.  Everybody knows that you  can slip, hit your head and get killed.  Does that mean that you should not shower again?  Nevertheless always keep in mind that statistics do not mean anything if you happen to be on the wrong side of them.  Even if a procedure has a 1 (one) in a 1,000,000 (million) chance of going wrong, it you happen to be that one..Also, keep in mind that by statistics, you have more of a chance of having something go wrong when taking medications.  Who should not have this procedure? If you are on a blood thinning medication (e.g. Coumadin, Plavix, see list of "Blood Thinners"), or if you have an active infection going on, you should not have the procedure.  If you are taking any blood  thinners, please inform your physician.  How should I prepare for this procedure?  Do not eat or drink anything at least six hours prior to the procedure.  Bring a driver with you .  It cannot be a taxi.  Come accompanied by an adult that can drive you back, and that is strong enough to help you if your legs get weak or numb from the local anesthetic.  Take all of your medicines the morning of the procedure with just enough water to swallow them.  If you have diabetes, make sure that you are scheduled to have your procedure done first thing in the morning, whenever possible.  If you have diabetes, take only half of your insulin dose and notify our nurse that you have done so as soon as you arrive at the clinic.  If you are diabetic, but only take blood sugar pills (oral hypoglycemic), then do not take them on the morning of your procedure.  You may take them after you have had the procedure.  Do not take aspirin or any aspirin-containing medications, at least eleven (11) days prior to the procedure.  They may prolong bleeding.  Wear loose fitting clothing that may be easy to take off and that you would not mind if it got stained with Betadine or blood.  Do not wear any jewelry or perfume  Remove any nail coloring.  It  will interfere with some of our monitoring equipment.  NOTE: Remember that this is not meant to be interpreted as a complete list of all possible complications.  Unforeseen problems may occur.  BLOOD THINNERS The following drugs contain aspirin or other products, which can cause increased bleeding during surgery and should not be taken for 2 weeks prior to and 1 week after surgery.  If you should need take something for relief of minor pain, you may take acetaminophen which is found in Tylenol,m Datril, Anacin-3 and Panadol. It is not blood thinner. The products listed below are.  Do not take any of the products listed below in addition to any listed on your instruction sheet.  A.P.C or A.P.C with Codeine Codeine Phosphate Capsules #3 Ibuprofen Ridaura  ABC compound Congesprin Imuran rimadil  Advil Cope Indocin Robaxisal  Alka-Seltzer Effervescent Pain Reliever and Antacid Coricidin or Coricidin-D  Indomethacin Rufen  Alka-Seltzer plus Cold Medicine Cosprin Ketoprofen S-A-C Tablets  Anacin Analgesic Tablets or Capsules Coumadin Korlgesic Salflex  Anacin Extra Strength Analgesic tablets or capsules CP-2 Tablets Lanoril Salicylate  Anaprox Cuprimine Capsules Levenox Salocol  Anexsia-D Dalteparin Magan Salsalate  Anodynos Darvon compound Magnesium Salicylate Sine-off  Ansaid Dasin Capsules Magsal Sodium Salicylate  Anturane Depen Capsules Marnal Soma  APF Arthritis pain formula Dewitt's Pills Measurin Stanback  Argesic Dia-Gesic Meclofenamic Sulfinpyrazone  Arthritis Bayer Timed Release Aspirin Diclofenac Meclomen Sulindac  Arthritis pain formula Anacin Dicumarol Medipren Supac  Analgesic (Safety coated) Arthralgen Diffunasal Mefanamic Suprofen  Arthritis Strength Bufferin Dihydrocodeine Mepro Compound Suprol  Arthropan liquid Dopirydamole Methcarbomol with Aspirin Synalgos  ASA tablets/Enseals Disalcid Micrainin Tagament  Ascriptin Doan's Midol Talwin  Ascriptin A/D Dolene Mobidin Tanderil   Ascriptin Extra Strength Dolobid Moblgesic Ticlid  Ascriptin with Codeine Doloprin or Doloprin with Codeine Momentum Tolectin  Asperbuf Duoprin Mono-gesic Trendar  Aspergum Duradyne Motrin or Motrin IB Triminicin  Aspirin plain, buffered or enteric coated Durasal Myochrisine Trigesic  Aspirin Suppositories Easprin Nalfon Trillsate  Aspirin with Codeine Ecotrin Regular or Extra Strength Naprosyn Uracel  Atromid-S Efficin Naproxen Ursinus  Auranofin Capsules Elmiron Neocylate Vanquish  Axotal Emagrin Norgesic Verin  Azathioprine Empirin or Empirin with Codeine Normiflo Vitamin E  Azolid Emprazil Nuprin Voltaren  Bayer Aspirin plain, buffered or children's or timed BC Tablets or powders Encaprin Orgaran Warfarin Sodium  Buff-a-Comp Enoxaparin Orudis Zorpin  Buff-a-Comp with Codeine Equegesic Os-Cal-Gesic   Buffaprin Excedrin plain, buffered or Extra Strength Oxalid   Bufferin Arthritis Strength Feldene Oxphenbutazone   Bufferin plain or Extra Strength Feldene Capsules Oxycodone with Aspirin   Bufferin with Codeine Fenoprofen Fenoprofen Pabalate or Pabalate-SF   Buffets II Flogesic Panagesic   Buffinol plain or Extra Strength Florinal or Florinal with Codeine Panwarfarin   Buf-Tabs Flurbiprofen Penicillamine   Butalbital Compound Four-way cold tablets Penicillin   Butazolidin Fragmin Pepto-Bismol   Carbenicillin Geminisyn Percodan   Carna Arthritis Reliever Geopen Persantine   Carprofen Gold's salt Persistin   Chloramphenicol Goody's Phenylbutazone   Chloromycetin Haltrain Piroxlcam   Clmetidine heparin Plaquenil   Cllnoril Hyco-pap Ponstel   Clofibrate Hydroxy chloroquine Propoxyphen         Before stopping any of these medications, be sure to consult the physician who ordered them.  Some, such as Coumadin (Warfarin) are ordered to prevent or treat serious conditions such as "deep thrombosis", "pumonary embolisms", and other heart problems.  The amount of time that you may need off  of the medication may also vary with the medication and the reason for which you were taking it.  If you are taking any of these medications, please make sure you notify your pain physician before you undergo any procedures.         Facet Blocks Patient Information  Description: The facets are joints in the spine between the vertebrae.  Like any joints in the body, facets can become irritated and painful.  Arthritis can also effect the facets.  By injecting steroids and local anesthetic in and around these joints, we can temporarily block the nerve supply to them.  Steroids act directly on irritated nerves and tissues to reduce selling and inflammation which often leads to decreased pain.  Facet blocks may be done anywhere along the spine from the neck to the low back depending upon the location of your pain.   After numbing the skin with local anesthetic (like Novocaine), a small needle is passed onto the facet joints under x-ray guidance.  You may experience a sensation of pressure while this is being done.  The entire block usually lasts about 15-25 minutes.   Conditions which may be treated by facet blocks:   Low back/buttock pain  Neck/shoulder pain  Certain types of headaches  Preparation for the injection:   Do not eat any solid food or dairy products within 8 hours of your appointment.  You may drink clear liquid up to 3 hours before appointment.  Clear liquids include water, black coffee, juice or soda.  No milk or cream please.  You may take your regular medication, including pain medications, with a sip of water before your appointment.  Diabetics should hold regular insulin (if taken separately) and take 1/2 normal NPH dose the morning of the procedure.  Carry some sugar containing items with you to your appointment.  A driver must accompany you and be prepared to drive you home after your procedure.  Bring all your current medications with you.  An IV may be inserted  and sedation may be given at the discretion of the physician.  A blood pressure cuff, EKG and other monitors will often be applied during the procedure.  Some patients may need to have extra oxygen administered for a short period.  You will be asked to provide medical information, including your allergies and medications, prior to the procedure.  We must know immediately if you are taking blood thinners (like Coumadin/Warfarin) or if you are allergic to IV iodine contrast (dye).  We must know if you could possible be pregnant.  Possible side-effects:   Bleeding from needle site  Infection (rare, may require surgery)  Nerve injury (rare)  Numbness & tingling (temporary)  Difficulty urinating (rare, temporary)  Spinal headache (a headache worse with upright posture)  Light-headedness (temporary)  Pain at injection site (serveral days)  Decreased blood pressure (rare, temporary)  Weakness in arm/leg (temporary)  Pressure sensation in back/neck (temporary)   Call if you experience:   Fever/chills associated with headache or increased back/neck pain  Headache worsened by an upright position  New onset, weakness or numbness of an extremity below the injection site  Hives or difficulty breathing (go to the emergency room)  Inflammation or drainage at the injection site(s)  Severe back/neck pain greater than usual  New symptoms which are concerning to you  Please note:  Although the local anesthetic injected can often make your back or neck feel good for several hours after the injection, the pain will likely return. It takes 3-7 days for steroids to work.  You may not notice any pain relief for at least one week.  If effective, we will often do a series of 2-3 injections spaced 3-6 weeks apart to maximally decrease your pain.  After the initial series, you may be a candidate for a more permanent nerve block of the facets.  If you have any questions, please call #[phone]  [facility]

## 2015-12-08 NOTE — Patient Instructions (Signed)
Radiofrequency Lesioning Radiofrequency lesioning is a procedure that is performed to relieve pain. The procedure is often used for back, neck, or arm pain. Radiofrequency lesioning involves the use of a machine that creates radio waves to make heat. During the procedure, the heat is applied to the nerve that carries the pain signal. The heat damages the nerve and interferes with the pain signal. Pain relief usually lasts for 6 months to 1 year. LET [facility] CARE PROVIDER KNOW ABOUT:  Any allergies you have.  All medicines you are taking, including vitamins, herbs, eye drops, creams, and over-the-counter medicines.  Previous problems you or members of your family have had with the use of anesthetics.  Any blood disorders you have.  Previous surgeries you have had.  Any medical conditions you have.  Whether you are pregnant or may be pregnant. RISKS AND COMPLICATIONS Generally, this is a safe procedure. However, problems may occur, including:  Pain or soreness at the injection site.  Infection at the injection site.  Damage to nerves or blood vessels. BEFORE THE PROCEDURE  Ask your health care provider about:  Changing or stopping your regular medicines. This is especially important if you are taking diabetes medicines or blood thinners.  Taking medicines such as aspirin and ibuprofen. These medicines can thin your blood. Do not take these medicines before your procedure if your health care provider instructs you not to.  Follow instructions from your health care provider about eating or drinking restrictions.  Plan to have someone take you home after the procedure.  If you go home right after the procedure, plan to have someone with you for 24 hours. PROCEDURE  You will be given one or more of the following:  A medicine to help you relax (sedative).  A medicine to numb the area (local anesthetic).  You will be awake during the procedure. You will need to be able to  talk with the health care provider during the procedure.  With the help of a type of X-ray (fluoroscopy), the health care provider will insert a radiofrequency needle into the area to be treated.  Next, a wire that carries the radio waves (electrode) will be put through the radiofrequency needle. An electrical pulse will be sent through the electrode to verify the correct nerve. You will feel a tingling sensation, and you may have muscle twitching.  Then, the tissue that is around the needle tip will be heated by an electric current that is passed using the radiofrequency machine. This will numb the nerves.  A bandage (dressing) will be put on the insertion area after the procedure is done. The procedure may vary among health care providers and hospitals. AFTER THE PROCEDURE  Your blood pressure, heart rate, breathing rate, and blood oxygen level will be monitored often until the medicines you were given have worn off.  Return to your normal activities as directed by your health care provider.   This information is not intended to replace advice given to you by your health care provider. Make sure you discuss any questions you have with your health care provider.   Document Released: 02/14/2011 Document Revised: 03/09/2015 Document Reviewed: 07/26/2014 Elsevier Interactive Patient Education 2016 [place]  What are the risk, side effects and possible complications? Generally speaking, most procedures are safe.  However, with any procedure there are risks, side effects, and the possibility of complications.  The risks and complications are dependent upon the sites that are lesioned, or the  type of nerve block to be performed.  The closer the procedure is to the spine, the more serious the risks are.  Great care is taken when placing the radio frequency needles, block needles or lesioning probes, but sometimes complications can occur.  Infection: Any time  there is an injection through the skin, there is a risk of infection.  This is why sterile conditions are used for these blocks.  There are four possible types of infection.  Localized skin infection.  Central Nervous System Infection-This can be in the form of Meningitis, which can be deadly.  Epidural Infections-This can be in the form of an epidural abscess, which can cause pressure inside of the spine, causing compression of the spinal cord with subsequent paralysis. This would require an emergency surgery to decompress, and there are no guarantees that the patient would recover from the paralysis.  Discitis-This is an infection of the intervertebral discs.  It occurs in about 1% of discography procedures.  It is difficult to treat and it may lead to surgery.        2. Pain: the needles have to go through skin and soft tissues, will cause soreness.       3. Damage to internal structures:  The nerves to be lesioned may be near blood vessels or    other nerves which can be potentially damaged.       4. Bleeding: Bleeding is more common if the patient is taking blood thinners such as  aspirin, Coumadin, Ticiid, Plavix, etc., or if he/she have some genetic predisposition  such as hemophilia. Bleeding into the spinal canal can cause compression of the spinal  cord with subsequent paralysis.  This would require an emergency surgery to  decompress and there are no guarantees that the patient would recover from the  paralysis.       5. Pneumothorax:  Puncturing of a lung is a possibility, every time a needle is introduced in  the area of the chest or upper back.  Pneumothorax refers to free air around the  collapsed lung(s), inside of the thoracic cavity (chest cavity).  Another two possible  complications related to a similar event would include: Hemothorax and Chylothorax.   These are variations of the Pneumothorax, where instead of air around the collapsed  lung(s), you may have blood or chyle,  respectively.       6. Spinal headaches: They may occur with any procedures in the area of the spine.       7. Persistent CSF (Cerebro-Spinal Fluid) leakage: This is a rare problem, but may occur  with prolonged intrathecal or epidural catheters either due to the formation of a fistulous  track or a dural tear.       8. Nerve damage: By working so close to the spinal cord, there is always a possibility of  nerve damage, which could be as serious as a permanent spinal cord injury with  paralysis.       9. Death:  Although rare, severe deadly allergic reactions known as "Anaphylactic  reaction" can occur to any of the medications used.      10. Worsening of the symptoms:  We can always make thing worse.  What are the chances of something like this happening? Chances of any of this occuring are extremely low.  By statistics, you have more of a chance of getting killed in a motor vehicle accident: while driving to the hospital than any of the above occurring .  Nevertheless, you should be aware that they are possibilities.  In general, it is similar to taking a shower.  Everybody knows that you can slip, hit your head and get killed.  Does that mean that you should not shower again?  Nevertheless always keep in mind that statistics do not mean anything if you happen to be on the wrong side of them.  Even if a procedure has a 1 (one) in a 1,000,000 (million) chance of going wrong, it you happen to be that one..Also, keep in mind that by statistics, you have more of a chance of having something go wrong when taking medications.  Who should not have this procedure? If you are on a blood thinning medication (e.g. Coumadin, Plavix, see list of "Blood Thinners"), or if you have an active infection going on, you should not have the procedure.  If you are taking any blood thinners, please inform your physician.  How should I prepare for this procedure?  Do not eat or drink anything at least six hours prior to the  procedure.  Bring a driver with you .  It cannot be a taxi.  Come accompanied by an adult that can drive you back, and that is strong enough to help you if your legs get weak or numb from the local anesthetic.  Take all of your medicines the morning of the procedure with just enough water to swallow them.  If you have diabetes, make sure that you are scheduled to have your procedure done first thing in the morning, whenever possible.  If you have diabetes, take only half of your insulin dose and notify our nurse that you have done so as soon as you arrive at the clinic.  If you are diabetic, but only take blood sugar pills (oral hypoglycemic), then do not take them on the morning of your procedure.  You may take them after you have had the procedure.  Do not take aspirin or any aspirin-containing medications, at least eleven (11) days prior to the procedure.  They may prolong bleeding.  Wear loose fitting clothing that may be easy to take off and that you would not mind if it got stained with Betadine or blood.  Do not wear any jewelry or perfume  Remove any nail coloring.  It will interfere with some of our monitoring equipment.  NOTE: Remember that this is not meant to be interpreted as a complete list of all possible complications.  Unforeseen problems may occur.  BLOOD THINNERS The following drugs contain aspirin or other products, which can cause increased bleeding during surgery and should not be taken for 2 weeks prior to and 1 week after surgery.  If you should need take something for relief of minor pain, you may take acetaminophen which is found in Tylenol,m Datril, Anacin-3 and Panadol. It is not blood thinner. The products listed below are.  Do not take any of the products listed below in addition to any listed on your instruction sheet.  A.P.C or A.P.C with Codeine Codeine Phosphate Capsules #3 Ibuprofen Ridaura  ABC compound Congesprin Imuran rimadil  Advil Cope Indocin  Robaxisal  Alka-Seltzer Effervescent Pain Reliever and Antacid Coricidin or Coricidin-D  Indomethacin Rufen  Alka-Seltzer plus Cold Medicine Cosprin Ketoprofen S-A-C Tablets  Anacin Analgesic Tablets or Capsules Coumadin Korlgesic Salflex  Anacin Extra Strength Analgesic tablets or capsules CP-2 Tablets Lanoril Salicylate  Anaprox Cuprimine Capsules Levenox Salocol  Anexsia-D Dalteparin Magan Salsalate  Anodynos Darvon compound Magnesium Salicylate Sine-off  Ansaid Dasin Capsules Magsal Sodium Salicylate  Anturane Depen Capsules Marnal Soma  APF Arthritis pain formula Dewitt's Pills Measurin Stanback  Argesic Dia-Gesic Meclofenamic Sulfinpyrazone  Arthritis Bayer Timed Release Aspirin Diclofenac Meclomen Sulindac  Arthritis pain formula Anacin Dicumarol Medipren Supac  Analgesic (Safety coated) Arthralgen Diffunasal Mefanamic Suprofen  Arthritis Strength Bufferin Dihydrocodeine Mepro Compound Suprol  Arthropan liquid Dopirydamole Methcarbomol with Aspirin Synalgos  ASA tablets/Enseals Disalcid Micrainin Tagament  Ascriptin Doan's Midol Talwin  Ascriptin A/D Dolene Mobidin Tanderil  Ascriptin Extra Strength Dolobid Moblgesic Ticlid  Ascriptin with Codeine Doloprin or Doloprin with Codeine Momentum Tolectin  Asperbuf Duoprin Mono-gesic Trendar  Aspergum Duradyne Motrin or Motrin IB Triminicin  Aspirin plain, buffered or enteric coated Durasal Myochrisine Trigesic  Aspirin Suppositories Easprin Nalfon Trillsate  Aspirin with Codeine Ecotrin Regular or Extra Strength Naprosyn Uracel  Atromid-S Efficin Naproxen Ursinus  Auranofin Capsules Elmiron Neocylate Vanquish  Axotal Emagrin Norgesic Verin  Azathioprine Empirin or Empirin with Codeine Normiflo Vitamin E  Azolid Emprazil Nuprin Voltaren  Bayer Aspirin plain, buffered or children's or timed BC Tablets or powders Encaprin Orgaran Warfarin Sodium  Buff-a-Comp Enoxaparin Orudis Zorpin  Buff-a-Comp with Codeine Equegesic Os-Cal-Gesic    Buffaprin Excedrin plain, buffered or Extra Strength Oxalid   Bufferin Arthritis Strength Feldene Oxphenbutazone   Bufferin plain or Extra Strength Feldene Capsules Oxycodone with Aspirin   Bufferin with Codeine Fenoprofen Fenoprofen Pabalate or Pabalate-SF   Buffets II Flogesic Panagesic   Buffinol plain or Extra Strength Florinal or Florinal with Codeine Panwarfarin   Buf-Tabs Flurbiprofen Penicillamine   Butalbital Compound Four-way cold tablets Penicillin   Butazolidin Fragmin Pepto-Bismol   Carbenicillin Geminisyn Percodan   Carna Arthritis Reliever Geopen Persantine   Carprofen Gold's salt Persistin   Chloramphenicol Goody's Phenylbutazone   Chloromycetin Haltrain Piroxlcam   Clmetidine heparin Plaquenil   Cllnoril Hyco-pap Ponstel   Clofibrate Hydroxy chloroquine Propoxyphen         Before stopping any of these medications, be sure to consult the physician who ordered them.  Some, such as Coumadin (Warfarin) are ordered to prevent or treat serious conditions such as "deep thrombosis", "pumonary embolisms", and other heart problems.  The amount of time that you may need off of the medication may also vary with the medication and the reason for which you were taking it.  If you are taking any of these medications, please make sure you notify your pain physician before you undergo any procedures.         Facet Blocks Patient Information  Description: The facets are joints in the spine between the vertebrae.  Like any joints in the body, facets can become irritated and painful.  Arthritis can also effect the facets.  By injecting steroids and local anesthetic in and around these joints, we can temporarily block the nerve supply to them.  Steroids act directly on irritated nerves and tissues to reduce selling and inflammation which often leads to decreased pain.  Facet blocks may be done anywhere along the spine from the neck to the low back depending upon the location of your  pain.   After numbing the skin with local anesthetic (like Novocaine), a small needle is passed onto the facet joints under x-ray guidance.  You may experience a sensation of pressure while this is being done.  The entire block usually lasts about 15-25 minutes.   Conditions which may be treated by facet blocks:   Low back/buttock pain  Neck/shoulder pain  Certain types of headaches  Preparation for  the injection:   Do not eat any solid food or dairy products within 8 hours of your appointment.  You may drink clear liquid up to 3 hours before appointment.  Clear liquids include water, black coffee, juice or soda.  No milk or cream please.  You may take your regular medication, including pain medications, with a sip of water before your appointment.  Diabetics should hold regular insulin (if taken separately) and take 1/2 normal NPH dose the morning of the procedure.  Carry some sugar containing items with you to your appointment.  A driver must accompany you and be prepared to drive you home after your procedure.  Bring all your current medications with you.  An IV may be inserted and sedation may be given at the discretion of the physician.  A blood pressure cuff, EKG and other monitors will often be applied during the procedure.  Some patients may need to have extra oxygen administered for a short period.  You will be asked to provide medical information, including your allergies and medications, prior to the procedure.  We must know immediately if you are taking blood thinners (like Coumadin/Warfarin) or if you are allergic to IV iodine contrast (dye).  We must know if you could possible be pregnant.  Possible side-effects:   Bleeding from needle site  Infection (rare, may require surgery)  Nerve injury (rare)  Numbness & tingling (temporary)  Difficulty urinating (rare, temporary)  Spinal headache (a headache worse with upright posture)  Light-headedness  (temporary)  Pain at injection site (serveral days)  Decreased blood pressure (rare, temporary)  Weakness in arm/leg (temporary)  Pressure sensation in back/neck (temporary)   Call if you experience:   Fever/chills associated with headache or increased back/neck pain  Headache worsened by an upright position  New onset, weakness or numbness of an extremity below the injection site  Hives or difficulty breathing (go to the emergency room)  Inflammation or drainage at the injection site(s)  Severe back/neck pain greater than usual  New symptoms which are concerning to you  Please note:  Although the local anesthetic injected can often make your back or neck feel good for several hours after the injection, the pain will likely return. It takes 3-7 days for steroids to work.  You may not notice any pain relief for at least one week.  If effective, we will often do a series of 2-3 injections spaced 3-6 weeks apart to maximally decrease your pain.  After the initial series, you may be a candidate for a more permanent nerve block of the facets.  If you have any questions, please call #[phone]) [facility]

## 2015-12-08 NOTE — Progress Notes (Signed)
Safety precautions to be maintained throughout the outpatient stay will include: orient to surroundings, keep bed in low position, maintain call bell within reach at all times, provide assistance with transfer out of bed and ambulation.  

## 2015-12-15 ENCOUNTER — Other Ambulatory Visit: Payer: Self-pay | Admitting: Family Medicine

## 2015-12-21 ENCOUNTER — Encounter: Payer: Self-pay | Admitting: Family Medicine

## 2015-12-21 ENCOUNTER — Ambulatory Visit (INDEPENDENT_AMBULATORY_CARE_PROVIDER_SITE_OTHER): Payer: Medicare Other | Admitting: Family Medicine

## 2015-12-21 VITALS — BP 121/62 | HR 54 | Temp 98.0°F | Wt 119.2 lb

## 2015-12-21 DIAGNOSIS — F329 Major depressive disorder, single episode, unspecified: Secondary | ICD-10-CM

## 2015-12-21 DIAGNOSIS — D649 Anemia, unspecified: Secondary | ICD-10-CM

## 2015-12-21 DIAGNOSIS — I482 Chronic atrial fibrillation, unspecified: Secondary | ICD-10-CM

## 2015-12-21 DIAGNOSIS — F32A Depression, unspecified: Secondary | ICD-10-CM | POA: Insufficient documentation

## 2015-12-21 LAB — CBC
HCT: 34.4 % — ABNORMAL LOW (ref 36.0–46.0)
Hemoglobin: 10.7 g/dL — ABNORMAL LOW (ref 12.0–15.0)
MCHC: 31.1 g/dL (ref 30.0–36.0)
MCV: 80.6 fl (ref 78.0–100.0)
PLATELETS: 328 10*3/uL (ref 150.0–400.0)
RBC: 4.26 Mil/uL (ref 3.87–5.11)
RDW: 23.3 % — ABNORMAL HIGH (ref 11.5–15.5)
WBC: 9.8 10*3/uL (ref 4.0–10.5)

## 2015-12-21 NOTE — Patient Instructions (Signed)
Nice to see you. We are going to recheck your hemoglobin today. Your tiredness could be related to your continued anemia versus depression. If you develop chest pain, shortness of breath, palpitations, worsening lightheadedness, thoughts of harming herself, worsening depression or anxiety, or any new or changing symptoms please seek medical attention.

## 2015-12-21 NOTE — Assessment & Plan Note (Signed)
Rate controlled today. Heart rate of 54 on my calculation. Asymptomatic. Recently switched to sotalol. Currently off Eliquis that she is awaiting injection tomorrow. We'll fax note to patient's cardiologist.

## 2015-12-21 NOTE — Progress Notes (Signed)
Patient ID: Amanda Soto, female   DOB: 09-09-1924, 82 y.o.   MRN: [id number]  Marikay Alar, MD Phone: [phone]  Amanda Soto is a 82 y.o. female who presents today for follow-up.  Patient reports she continues to feel tired. Recently diagnosed with anemia related to GI bleed. Is followed with GI and they have her on omeprazole. She denies stomach discomfort and blood per rectum. No chest pain or shortness of breath. No palpitations. She is seeing cardiology and they advised that she could come off Eliquis for a back injection she is having tomorrow. She goes back on Eliquis right after this. She was switched to sotalol from amiodarone as well. Notes not having much energy. Notes the energy level has been low prior to switching to sotalol. She does note feeling depressed and having decreased interest in things. Not on medication for this at this time. She does report she has been sleeping more than usual.  Depression screen [facility] 2/9 12/21/2015 12/08/2015 11/24/2015  Decreased Interest 3 0 0  Down, Depressed, Hopeless 3 0 0  PHQ - 2 Score 6 0 0  Altered sleeping 2 - -  Tired, decreased energy 3 - -  Change in appetite 1 - -  Feeling bad or failure about yourself  0 - -  Trouble concentrating 0 - -  Moving slowly or fidgety/restless 0 - -  Suicidal thoughts 0 - -  PHQ-9 Score 12 - -  Difficult doing work/chores Somewhat difficult - -   GAD 7 : Generalized Anxiety Score 12/21/2015  Nervous, Anxious, on Edge 1  Control/stop worrying 0  Worry too much - different things 0  Trouble relaxing 0  Restless 0  Easily annoyed or irritable 0  Afraid - awful might happen 0  Total GAD 7 Score 1  Anxiety Difficulty Not difficult at all    PMH: nonsmoker.   ROS see history of present illness  Objective  Physical Exam Filed Vitals:   12/21/15 1021  BP: 121/62  Pulse: 54  Temp: 98 F (36.7 C)    BP Readings from Last 3 Encounters:  12/21/15 121/62  12/08/15 113/44  11/24/15 130/59    Wt Readings from Last 3 Encounters:  12/21/15 119 lb 3.2 oz (54.069 kg)  12/08/15 117 lb (53.071 kg)  11/24/15 122 lb (55.339 kg)    Physical Exam  Constitutional: She is well-developed, well-nourished, and in no distress.  HENT:  Head: Normocephalic and atraumatic.  Right Ear: External ear normal.  Left Ear: External ear normal.  Cardiovascular: Normal rate, regular rhythm and normal heart sounds.   Pulmonary/Chest: Effort normal and breath sounds normal.  Abdominal: Soft. She exhibits no distension. There is no tenderness.  Neurological: She is alert.  Skin: Skin is warm and dry. She is not diaphoretic.  Psychiatric:  Mood depressed, affect normal     Assessment/Plan: Please see individual problem list.  Anemia Overall patient is still fatigued. She's been taking her iron supplements. Also on omeprazole. Has seen GI. No blood per rectum noted. Benign abdominal exam. She will continue iron. We'll check a CBC today. She'll continue to monitor. Given return precautions.  Depression Patient notes depression relating to her medical illnesses. Suspect this is likely contributing to her fatigue and increased sleeping. Discussed potential treatments including seeing a therapist or medication. Patient declined therapist and medication at this time. She wants to continue to monitor and see if getting out and about more will be of benefit. She is given return  precautions.  Atrial fibrillation ([place]) Rate controlled today. Heart rate of 54 on my calculation. Asymptomatic. Recently switched to sotalol. Currently off Eliquis that she is awaiting injection tomorrow. We'll fax note to patient's cardiologist.    Orders Placed This Encounter  Procedures  . CBC      [name], MD [place]

## 2015-12-21 NOTE — Assessment & Plan Note (Signed)
Overall patient is still fatigued. She's been taking her iron supplements. Also on omeprazole. Has seen GI. No blood per rectum noted. Benign abdominal exam. She will continue iron. We'll check a CBC today. She'll continue to monitor. Given return precautions.

## 2015-12-21 NOTE — Assessment & Plan Note (Addendum)
Patient notes depression relating to her medical illnesses. Suspect this is likely contributing to her fatigue and increased sleeping. Discussed potential treatments including seeing a therapist or medication. Patient declined therapist and medication at this time. She wants to continue to monitor and see if getting out and about more will be of benefit. She is given return precautions.

## 2015-12-22 ENCOUNTER — Ambulatory Visit: Payer: Medicare Other | Attending: Pain Medicine | Admitting: Pain Medicine

## 2015-12-22 ENCOUNTER — Encounter: Payer: Self-pay | Admitting: Pain Medicine

## 2015-12-22 VITALS — BP 116/38 | HR 40 | Temp 97.8°F | Resp 20 | Ht 59.0 in | Wt 119.0 lb

## 2015-12-22 DIAGNOSIS — I5022 Chronic systolic (congestive) heart failure: Secondary | ICD-10-CM | POA: Insufficient documentation

## 2015-12-22 DIAGNOSIS — R011 Cardiac murmur, unspecified: Secondary | ICD-10-CM | POA: Insufficient documentation

## 2015-12-22 DIAGNOSIS — M4316 Spondylolisthesis, lumbar region: Secondary | ICD-10-CM | POA: Diagnosis not present

## 2015-12-22 DIAGNOSIS — M1611 Unilateral primary osteoarthritis, right hip: Secondary | ICD-10-CM | POA: Diagnosis not present

## 2015-12-22 DIAGNOSIS — G8929 Other chronic pain: Secondary | ICD-10-CM | POA: Insufficient documentation

## 2015-12-22 DIAGNOSIS — M549 Dorsalgia, unspecified: Secondary | ICD-10-CM | POA: Diagnosis present

## 2015-12-22 DIAGNOSIS — I10 Essential (primary) hypertension: Secondary | ICD-10-CM | POA: Insufficient documentation

## 2015-12-22 DIAGNOSIS — M810 Age-related osteoporosis without current pathological fracture: Secondary | ICD-10-CM | POA: Insufficient documentation

## 2015-12-22 DIAGNOSIS — M5134 Other intervertebral disc degeneration, thoracic region: Secondary | ICD-10-CM | POA: Insufficient documentation

## 2015-12-22 DIAGNOSIS — Q762 Congenital spondylolisthesis: Secondary | ICD-10-CM | POA: Diagnosis not present

## 2015-12-22 DIAGNOSIS — M4310 Spondylolisthesis, site unspecified: Secondary | ICD-10-CM

## 2015-12-22 DIAGNOSIS — R001 Bradycardia, unspecified: Secondary | ICD-10-CM | POA: Insufficient documentation

## 2015-12-22 DIAGNOSIS — I4891 Unspecified atrial fibrillation: Secondary | ICD-10-CM | POA: Diagnosis not present

## 2015-12-22 DIAGNOSIS — M5136 Other intervertebral disc degeneration, lumbar region: Secondary | ICD-10-CM | POA: Diagnosis not present

## 2015-12-22 DIAGNOSIS — M4186 Other forms of scoliosis, lumbar region: Secondary | ICD-10-CM | POA: Diagnosis not present

## 2015-12-22 DIAGNOSIS — E039 Hypothyroidism, unspecified: Secondary | ICD-10-CM | POA: Insufficient documentation

## 2015-12-22 DIAGNOSIS — M545 Low back pain: Secondary | ICD-10-CM | POA: Diagnosis not present

## 2015-12-22 DIAGNOSIS — F419 Anxiety disorder, unspecified: Secondary | ICD-10-CM | POA: Insufficient documentation

## 2015-12-22 DIAGNOSIS — D649 Anemia, unspecified: Secondary | ICD-10-CM | POA: Diagnosis not present

## 2015-12-22 DIAGNOSIS — E785 Hyperlipidemia, unspecified: Secondary | ICD-10-CM | POA: Insufficient documentation

## 2015-12-22 DIAGNOSIS — M1288 Other specific arthropathies, not elsewhere classified, other specified site: Secondary | ICD-10-CM | POA: Insufficient documentation

## 2015-12-22 DIAGNOSIS — M47816 Spondylosis without myelopathy or radiculopathy, lumbar region: Secondary | ICD-10-CM | POA: Diagnosis not present

## 2015-12-22 DIAGNOSIS — F339 Major depressive disorder, recurrent, unspecified: Secondary | ICD-10-CM | POA: Insufficient documentation

## 2015-12-22 DIAGNOSIS — R109 Unspecified abdominal pain: Secondary | ICD-10-CM | POA: Diagnosis not present

## 2015-12-22 MED ORDER — LACTATED RINGERS IV SOLN: 1000.0000 mL | Freq: Once | INTRAVENOUS | Status: DC

## 2015-12-22 MED ORDER — FENTANYL CITRATE (PF) 100 MCG/2ML IJ SOLN: 25.0000 ug | INTRAMUSCULAR | Status: DC | PRN

## 2015-12-22 MED ORDER — ROPIVACAINE HCL 2 MG/ML IJ SOLN
INTRAMUSCULAR | Status: AC
  Administered 2015-12-22: 13:00:00
  Filled 2015-12-22: qty 10

## 2015-12-22 MED ORDER — MIDAZOLAM HCL 5 MG/5ML IJ SOLN: 1.0000 mg | INTRAMUSCULAR | Status: DC | PRN

## 2015-12-22 MED ORDER — TRIAMCINOLONE ACETONIDE 40 MG/ML IJ SUSP: 40.0000 mg | Freq: Once | INTRAMUSCULAR | Status: DC

## 2015-12-22 MED ORDER — FENTANYL CITRATE (PF) 100 MCG/2ML IJ SOLN
INTRAMUSCULAR | Status: AC
  Administered 2015-12-22: 100 ug
  Filled 2015-12-22: qty 2

## 2015-12-22 MED ORDER — LIDOCAINE HCL (PF) 1 % IJ SOLN
10.0000 mL | Freq: Once | INTRAMUSCULAR | Status: AC
  Administered 2015-12-22: 10 mL

## 2015-12-22 MED ORDER — TRIAMCINOLONE ACETONIDE 40 MG/ML IJ SUSP
INTRAMUSCULAR | Status: AC
  Administered 2015-12-22: 13:00:00
  Filled 2015-12-22: qty 1

## 2015-12-22 MED ORDER — MIDAZOLAM HCL 5 MG/5ML IJ SOLN
INTRAMUSCULAR | Status: AC
  Administered 2015-12-22: 1 mg
  Filled 2015-12-22: qty 5

## 2015-12-22 MED ORDER — ROPIVACAINE HCL 2 MG/ML IJ SOLN: 9.0000 mL | Freq: Once | INTRAMUSCULAR | Status: DC

## 2015-12-22 NOTE — Patient Instructions (Signed)

## 2015-12-22 NOTE — Progress Notes (Signed)
Patient's Name: Amanda Soto  Patient type: Established  MRN: [id number]  Service setting: Ambulatory outpatient  DOB: [birth year]  Location: [facility]  DOS: 12/22/2015  Primary Care Physician: [name], MD  Note by: Kathlen Brunswick. Dossie Arbour, M.D, DABA, DABAPM, DABPM, Milagros Evener, FIPP  Referring Physician: Milinda Pointer, MD  Specialty: Board-Certified Interventional Pain Management  Last Visit to Pain Management: 12/08/2015   Primary Reason(s) for Visit: Interventional Pain Management Treatment. CC: Back Pain  Primary Diagnosis: Facet syndrome, lumbar [M54.5]   Procedure:  Anesthesia, Analgesia, Anxiolysis:  Type: Diagnostic Medial Branch Facet Block Region: Lumbar Level: L2, L3, L4, L5, & S1 Medial Branch Level(s) Laterality: Right  Indications: 1. Lumbar facet syndrome (Location of Primary Source of Pain) (Right)   2. Lumbar facet arthropathy   3. Lumbar spondylosis, unspecified spinal osteoarthritis   4. Grade 1 Anterolisthesis of L4 over L5 (5 mm)     Pre-procedure Pain Score: 2/10 Reported level of pain is compatible with clinical observations Post-procedure Pain Score: 0-No pain  Type: Moderate (Conscious) Sedation & Local Anesthesia Local Anesthetic: Lidocaine 1% Route: Intravenous (IV) IV Access: Secured Sedation: Meaningful verbal contact was maintained at all times during the procedure  Indication(s): Analgesia & Anxiolysis   Pre-Procedure Assessment:  Ms. Daniel is a 82 y.o. year old, female patient, seen today for interventional treatment. She has Chronic systolic heart failure ([place]); Bradycardia; Atrial fibrillation ([place]); HTN (hypertension); Abdominal discomfort; Exertional shortness of breath; Elevated glucose; Hypothyroidism; Lightheadedness; Symptomatic anemia; Anemia; Anxiety; Degeneration of intervertebral disc of lumbar region; Recurrent major depressive disorder, in partial remission ([place]); Encounter for general adult medical  examination without abnormal findings; Cardiac murmur; H/O neoplasm; HLD (hyperlipidemia); Adult hypothyroidism; MI (mitral incompetence); OP (osteoporosis); Chronic pain; Chronic low back pain (Location of Primary Source of Pain) (Right); Lumbar facet syndrome (Location of Primary Source of Pain) (Right); Grade 1 Anterolisthesis of L4 over L5 (5 mm); Lumbar spondylosis; Scoliosis of lumbar spine (concave to right side); Lumbar facet arthropathy; Osteoarthritis of hip (Right); Thoracic paracentral T7-8 disc protrusion; and Depression on her problem list.. Her primarily concern today is the Back Pain   Pain Type: Chronic pain Pain Location: Back Pain Orientation: Lower Pain Descriptors / Indicators: Aching, Sharp Pain Frequency: Constant  Date of Last Visit: 12/08/15 Service Provided on Last Visit: Evaluation  Coagulation Parameters Lab Results  Component Value Date   INR 1.3 02/05/2014   LABPROT 15.5* 02/05/2014   APTT < 23.0* 02/05/2014   PLT 328.0 12/21/2015    Verification of the correct person, correct site (including marking of site), and correct procedure were performed and confirmed by the patient.  Consent: Secured. Under the influence of no sedatives a written informed consent was obtained, after having provided information on the risks and possible complications. To fulfill our ethical and legal obligations, as recommended by the American Medical Association's Code of Ethics, we have provided information to the patient about our clinical impression; the nature and purpose of the treatment or procedure; the risks, benefits, and possible complications of the intervention; alternatives; the risk(s) and benefit(s) of the alternative treatment(s) or procedure(s); and the risk(s) and benefit(s) of doing nothing. The patient was provided information about the risks and possible complications associated with the procedure. These include, but are not limited to, failure to achieve desired  goals, infection, bleeding, organ or nerve damage, allergic reactions, paralysis, and death. In the case of spinal procedures these may include, but are not limited to, failure to achieve desired goals,  infection, bleeding, organ or nerve damage, allergic reactions, paralysis, and death. In addition, the patient was informed that Medicine is not an exact science; therefore, there is also the possibility of unforeseen risks and possible complications that may result in a catastrophic outcome. The patient indicated having understood very clearly. We have given the patient no guarantees and we have made no promises. Enough time was given to the patient to ask questions, all of which were answered to the patient's satisfaction.  Consent Attestation: I, the ordering provider, attest that I have discussed with the patient the benefits, risks, side-effects, alternatives, likelihood of achieving goals, and potential problems during recovery for the procedure that I have provided informed consent.  Pre-Procedure Preparation: Safety Precautions: Allergies reviewed. Appropriate site, procedure, and patient were confirmed by following the Joint Commission's Universal Protocol (UP.01.01.01), in the form of a "Time Out". The patient was asked to confirm marked site and procedure, before commencing. The patient was asked about blood thinners, or active infections, both of which were denied. Patient was assessed for positional comfort and all pressure points were checked before starting procedure. Allergies: She is allergic to hydrocodone; ciprofloxacin; and latex.. Infection Control Precautions: Sterile technique used. Standard Universal Precautions were taken as recommended by the Department of [facility] for Disease Control and Prevention (CDC). Standard pre-surgical skin prep was conducted. Respiratory hygiene and cough etiquette was practiced. Hand hygiene observed. Safe injection practices and needle  disposal techniques followed. SDV (single dose vial) medications used. Medications properly checked for expiration dates and contaminants. Personal protective equipment (PPE) used: Sterile Radiation-resistant gloves. Monitoring:  As per clinic protocol. Filed Vitals:   12/22/15 1317 12/22/15 1324 12/22/15 1333 12/22/15 1344  BP: 128/41 111/30 115/40 116/38  Pulse: 49 42 42 40  Temp:  97.8 F (36.6 C)    TempSrc:  Temporal    Resp: 15 24 16 20   Height:      Weight:      SpO2: 93% 94% 98% 96%  Calculated BMI: Body mass index is 24.02 kg/(m^2).  Description of Procedure Process:   Time-out: "Time-out" completed before starting procedure, as per protocol. Position: Prone Target Area: For Lumbar Facet blocks, the target is the groove formed by the junction of the transverse process and superior articular process. For the L5 dorsal ramus, the target is the notch between superior articular process and sacral ala. For the S1 dorsal ramus, the target is the superior and lateral edge of the posterior S1 Sacral foramen. Approach: Paramedial approach. Area Prepped: Entire Posterior Lumbosacral Region Prepping solution: ChloraPrep (2% chlorhexidine gluconate and 70% isopropyl alcohol) Safety Precautions: Aspiration looking for blood return was conducted prior to all injections. At no point did we inject any substances, as a needle was being advanced. No attempts were made at seeking any paresthesias. Safe injection practices and needle disposal techniques used. Medications properly checked for expiration dates. SDV (single dose vial) medications used.   Description of the Procedure: Protocol guidelines were followed. The patient was placed in position over the fluoroscopy table. The target area was identified and the area prepped in the usual manner. Skin desensitized using vapocoolant spray. Skin & deeper tissues infiltrated with local anesthetic. Appropriate amount of time allowed to pass for local  anesthetics to take effect. The procedure needle was introduced through the skin, ipsilateral to the reported pain, and advanced to the target area. Employing the "Medial Branch Technique", the needles were advanced to the angle made by the superior and medial portion  of the transverse process, and the lateral and inferior portion of the superior articulating process of the targeted vertebral bodies. This area is known as "Burton's Eye" or the "Eye of the [place] Dog". A procedure needle was introduced through the skin, and this time advanced to the angle made by the superior and medial border of the sacral ala, and the lateral border of the S1 vertebral body. This last needle was later repositioned at the superior and lateral border of the posterior S1 foramen. Negative aspiration confirmed. Solution injected in intermittent fashion, asking for systemic symptoms every 0.5cc of injectate. The needles were then removed and the area cleansed, making sure to leave some of the prepping solution back to take advantage of its long term bactericidal properties. EBL: None Materials & Medications Used:  Needle(s) Used: 22g - 3.5" Spinal Needle(s)  Imaging Guidance:   Type of Imaging Technique: Fluoroscopy Guidance (Spinal) Indication(s): Assistance in needle guidance and placement for procedures requiring needle placement in or near specific anatomical locations not easily accessible without such assistance. Exposure Time: Please see nurses notes. Contrast: None required. Fluoroscopic Guidance: I was personally present in the fluoroscopy suite, where the patient was placed in position for the procedure, over the fluoroscopy-compatible table. Fluoroscopy was manipulated, using "Tunnel Vision Technique", to obtain the best possible view of the target area, on the affected side. Parallax error was corrected before commencing the procedure. A "direction-depth-direction" technique was used to introduce the needle under  continuous pulsed fluoroscopic guidance. Once the target was reached, antero-posterior, oblique, and lateral fluoroscopic projection views were taken to confirm needle placement in all planes. Permanently recorded images stored by scanning into EMR. Interpretation: Intraoperative imaging interpretation by performing Physician. Adequate needle placement confirmed. Adequate needle placement confirmed in AP, lateral, & Oblique Views. No contrast injected.  Antibiotic Prophylaxis:  Indication(s): No indications identified. Type:  Antibiotics Given (last 72 hours)    None       Post-operative Assessment:   Complications: No immediate post-treatment complications were observed. Disposition: Return to clinic for follow-up evaluation. The patient tolerated the entire procedure well. A repeat set of vitals were taken after the procedure and the patient was kept under observation following institutional policy, for this procedure. Post-procedural neurological assessment was performed, showing return to baseline, prior to discharge. The patient was discharged home, once institutional criteria were met. The patient was provided with post-procedure discharge instructions, including a section on how to identify potential problems. Should any problems arise concerning this procedure, the patient was given instructions to immediately contact us, at any time, without hesitation. In any case, we plan to contact the patient by telephone for a follow-up status report regarding this interventional procedure. Comments:  No additional relevant information.  Plan of Care   Problem List Items Addressed This Visit      High   Grade 1 Anterolisthesis of L4 over L5 (5 mm) (Chronic)   Relevant Orders   LUMBAR FACET(MEDIAL BRANCH NERVE BLOCK) MBNB   Lumbar facet arthropathy (Chronic)   Relevant Orders   LUMBAR FACET(MEDIAL BRANCH NERVE BLOCK) MBNB   Lumbar facet syndrome (Location of Primary Source of Pain) (Right) -  Primary (Chronic)   Relevant Medications   fentaNYL (SUBLIMAZE) injection 25-50 mcg   lactated ringers infusion 1,000 mL   midazolam (VERSED) 5 MG/5ML injection 1-2 mg   triamcinolone acetonide (KENALOG-40) injection 40 mg   lidocaine (PF) (XYLOCAINE) 1 % injection 10 mL (Completed)   ropivacaine (PF) 2 mg/ml (0.2%) (NAROPIN) epidural  9 mL   triamcinolone acetonide (KENALOG-40) 40 MG/ML injection (Completed)   fentaNYL (SUBLIMAZE) 100 MCG/2ML injection (Completed)   Other Relevant Orders   LUMBAR FACET(MEDIAL BRANCH NERVE BLOCK) MBNB   Lumbar spondylosis (Chronic)   Relevant Medications   fentaNYL (SUBLIMAZE) injection 25-50 mcg   triamcinolone acetonide (KENALOG-40) injection 40 mg   triamcinolone acetonide (KENALOG-40) 40 MG/ML injection (Completed)   fentaNYL (SUBLIMAZE) 100 MCG/2ML injection (Completed)   Other Relevant Orders   LUMBAR FACET(MEDIAL BRANCH NERVE BLOCK) MBNB       Pharmacotherapy (Medications Ordered): Meds ordered this encounter  Medications  . fentaNYL (SUBLIMAZE) injection 25-50 mcg    Sig:     Make sure Narcan is available in the pyxis when using this medication. In the event of respiratory depression (RR< 8/min): Titrate NARCAN (naloxone) in increments of 0.1 to 0.2 mg IV at 2-3 minute intervals, until desired degree of reversal.  . lactated ringers infusion 1,000 mL    Sig:   . midazolam (VERSED) 5 MG/5ML injection 1-2 mg    Sig:     Make sure Flumazenil is available in the pyxis when using this medication. If oversedation occurs, administer 0.2 mg IV over 15 sec. If after 45 sec no response, administer 0.2 mg again over 1 min; may repeat at 1 min intervals; not to exceed 4 doses (1 mg)  . triamcinolone acetonide (KENALOG-40) injection 40 mg    Sig:   . lidocaine (PF) (XYLOCAINE) 1 % injection 10 mL    Sig:   . ropivacaine (PF) 2 mg/ml (0.2%) (NAROPIN) epidural 9 mL    Sig:   . ropivacaine (PF) 2 mg/ml (0.2%) (NAROPIN) 2 MG/ML epidural    Sig:      Donneta Romberg, Dena: cabinet override  . triamcinolone acetonide (KENALOG-40) 40 MG/ML injection    Sig:     Donneta Romberg, Dena: cabinet override  . fentaNYL (SUBLIMAZE) 100 MCG/2ML injection    Sig:     Donneta Romberg, Dena: cabinet override  . midazolam (VERSED) 5 MG/5ML injection    Sig:     Donneta Romberg, Dena: cabinet override    Lab-work & Procedure Ordered: Orders Placed This Encounter  Procedures  . LUMBAR FACET(MEDIAL BRANCH NERVE BLOCK) MBNB    New medications started at this visit: New Prescriptions   No medications on file    Medications administered during this visit: We administered lidocaine (PF), ropivacaine (PF) 2 mg/ml (0.2%), triamcinolone acetonide, fentaNYL, and midazolam.  Requested PM Follow-up: Return for Post-Procedure Eval (2 weeks).  Future Appointments Date Time Provider [place]  01/20/2016 10:00 AM [name], MD [facility] None  01/25/2016 9:20 AM Milinda Pointer, MD [facility] None    Primary Care Physician: [name], MD Location: [facility] Note by: Kathlen Brunswick. Dossie Arbour, M.D, DABA, DABAPM, DABPM, DABIPP, FIPP   Illustration of the posterior view of the lumbar spine and the posterior neural structures. Laminae of L2 through S1 are labeled. DPRL5, dorsal primary ramus of L5; DPRS1, dorsal primary ramus of S1; DPR3, dorsal primary ramus of L3; FJ, facet (zygapophyseal) joint L3-L4; I, inferior articular process of L4; LB1, lateral branch of dorsal primary ramus of L1; IAB, inferior articular branches from L3 medial branch (supplies L4-L5 facet joint); IBP, intermediate branch plexus; MB3, medial branch of dorsal primary ramus of L3; NR3, third lumbar nerve root; S, superior articular process of L5; SAB, superior articular branches from L4 (supplies L4-5 facet joint also); TP3, transverse process of L3.  Disclaimer:  Medicine is  not an Chief Strategy Officer. The only guarantee in medicine is that nothing is guaranteed. It  is important to note that the decision to proceed with this intervention was based on the information collected from the patient. The Data and conclusions were drawn from the patient's questionnaire, the interview, and the physical examination. Because the information was provided in large part by the patient, it cannot be guaranteed that it has not been purposely or unconsciously manipulated. Every effort has been made to obtain as much relevant data as possible for this evaluation. It is important to note that the conclusions that lead to this procedure are derived in large part from the available data. Always take into account that the treatment will also be dependent on availability of resources and existing treatment guidelines, considered by other Pain Management Practitioners as being common knowledge and practice, at the time of the intervention. For Medico-Legal purposes, it is also important to point out that variation in procedural techniques and pharmacological choices are the acceptable norm. The indications, contraindications, technique, and results of the above procedure should only be interpreted and judged by a Board-Certified Interventional Pain Specialist with extensive familiarity and expertise in the same exact procedure and technique. Attempts at providing opinions without similar or greater experience and expertise than that of the treating physician will be considered as inappropriate and unethical, and shall result in a formal complaint to the state medical board and applicable specialty societies.

## 2015-12-22 NOTE — Progress Notes (Signed)
Safety precautions to be maintained throughout the outpatient stay will include: orient to surroundings, keep bed in low position, maintain call bell within reach at all times, provide assistance with transfer out of bed and ambulation.  

## 2015-12-23 ENCOUNTER — Telehealth: Payer: Self-pay

## 2015-12-23 NOTE — Telephone Encounter (Signed)
Post procedure phone call.  Patient states she was up all night voiding.  States that she has heat on it today, but is doing ok.

## 2016-01-04 ENCOUNTER — Ambulatory Visit (INDEPENDENT_AMBULATORY_CARE_PROVIDER_SITE_OTHER): Payer: Medicare Other | Admitting: Family Medicine

## 2016-01-04 ENCOUNTER — Encounter: Payer: Self-pay | Admitting: Family Medicine

## 2016-01-04 VITALS — BP 138/64 | HR 52 | Temp 98.1°F | Ht 59.0 in | Wt 114.8 lb

## 2016-01-04 DIAGNOSIS — N390 Urinary tract infection, site not specified: Secondary | ICD-10-CM | POA: Diagnosis not present

## 2016-01-04 DIAGNOSIS — R300 Dysuria: Secondary | ICD-10-CM | POA: Diagnosis not present

## 2016-01-04 DIAGNOSIS — T148XXA Other injury of unspecified body region, initial encounter: Secondary | ICD-10-CM | POA: Insufficient documentation

## 2016-01-04 DIAGNOSIS — T148 Other injury of unspecified body region: Secondary | ICD-10-CM

## 2016-01-04 LAB — POCT URINALYSIS DIPSTICK
BILIRUBIN UA: NEGATIVE
Glucose, UA: NEGATIVE
Ketones, UA: NEGATIVE
LEUKOCYTES UA: NEGATIVE
NITRITE UA: NEGATIVE
PH UA: 7
Protein, UA: NEGATIVE
Spec Grav, UA: 1.01
UROBILINOGEN UA: 0.2

## 2016-01-04 MED ORDER — CEPHALEXIN 500 MG PO CAPS: 500.0000 mg | ORAL_CAPSULE | Freq: Two times a day (BID) | ORAL | Status: DC

## 2016-01-04 NOTE — Progress Notes (Signed)
Patient ID: Amanda Soto, female   DOB: [birth year], 82 y.o.   MRN: [id number]  Amanda Rumps, MD Phone: [phone]  Amanda Soto is a 82 y.o. female who presents today for same-day visit.  Patient notes several days of mild low back discomfort. Also notes some suprapubic discomfort described as a burning sensation. Notes some increased frequency and urgency with urination. Some dysuria as well. No fevers or vaginal discharge. Patient is status post appendectomy and hysterectomy.  She also notes a bruise on her right lower extremity over her shin. Notes there was a stinging and then the next day there was a bruise. No noted injury. She is on Eliquis. No bleeding.  PMH: nonsmoker.   ROS see history of present illness  Objective  Physical Exam Filed Vitals:   01/04/16 1609  BP: 138/64  Pulse: 52  Temp: 98.1 F (36.7 C)    BP Readings from Last 3 Encounters:  01/04/16 138/64  12/22/15 116/38  12/21/15 121/62   Wt Readings from Last 3 Encounters:  01/04/16 114 lb 12.8 oz (52.073 kg)  12/22/15 119 lb (53.978 kg)  12/21/15 119 lb 3.2 oz (54.069 kg)    Physical Exam  Constitutional: She is well-developed, well-nourished, and in no distress.  HENT:  Head: Normocephalic and atraumatic.  Cardiovascular: Normal heart sounds.   Irregularly irregular  Pulmonary/Chest: Effort normal and breath sounds normal.  Abdominal: Soft. Bowel sounds are normal. She exhibits no distension. There is tenderness (mild suprapubic tenderness). There is no rebound and no guarding.  Musculoskeletal:  No midline spine tenderness, no midline spine step-off, no muscular back tenderness, bruising over the medial right mid shin, nontender, no swelling  Neurological: She is alert.  5 out of 5 strength bilateral quads, hamstrings, plantar flexion, and dorsiflexion, sensation light touch intact bilateral lower extremities  Skin: Skin is warm and dry. She is not diaphoretic.     Assessment/Plan:  Please see individual problem list.  UTI (lower urinary tract infection) Symptoms most consistent with UTI. UA with blood noted which could be consistent with UTI. Abdominal exam with mild suprapubic tenderness. Patient is status post appendectomy and hysterectomy. We'll proceed with treatment with Keflex and send urine for culture. Patient will continue to monitor. She's given return precautions.  Bruising Patient with small bruise over right shin. Suspect combination of age-related skin changes and the fact that she is on Eliquis lead to this. Discussed being careful when moving around and monitoring. Given return precautions.    Orders Placed This Encounter  Procedures  . Urine Culture  . Urine Microscopic Only  . POCT Urinalysis Dipstick    Meds ordered this encounter  Medications  . cephALEXin (KEFLEX) 500 MG capsule    Sig: Take 1 capsule (500 mg total) by mouth 2 (two) times daily.    Dispense:  14 capsule    Refill:  0    Amanda Rumps, MD [place]

## 2016-01-04 NOTE — Assessment & Plan Note (Signed)
Symptoms most consistent with UTI. UA with blood noted which could be consistent with UTI. Abdominal exam with mild suprapubic tenderness. Patient is status post appendectomy and hysterectomy. We'll proceed with treatment with Keflex and send urine for culture. Patient will continue to monitor. She's given return precautions.

## 2016-01-04 NOTE — Patient Instructions (Signed)
Nice to see you. We'll start you on Keflex to treat for UTI. We'll send her urine for culture. If you develop abdominal pain, fevers, or any new or changing symptoms please seek medical attention.

## 2016-01-04 NOTE — Assessment & Plan Note (Signed)
Patient with small bruise over right shin. Suspect combination of age-related skin changes and the fact that she is on Eliquis lead to this. Discussed being careful when moving around and monitoring. Given return precautions.

## 2016-01-04 NOTE — Progress Notes (Signed)
Pre visit review using our clinic review tool, if applicable. No additional management support is needed unless otherwise documented below in the visit note. 

## 2016-01-05 LAB — URINE CULTURE: Colony Count: 80000

## 2016-01-05 LAB — URINALYSIS, MICROSCOPIC ONLY: RBC / HPF: NONE SEEN (ref 0–?)

## 2016-01-11 ENCOUNTER — Ambulatory Visit: Payer: Medicare Other | Admitting: Family Medicine

## 2016-01-20 ENCOUNTER — Ambulatory Visit (INDEPENDENT_AMBULATORY_CARE_PROVIDER_SITE_OTHER): Payer: Medicare Other | Admitting: Family Medicine

## 2016-01-20 ENCOUNTER — Encounter: Payer: Self-pay | Admitting: Family Medicine

## 2016-01-20 VITALS — BP 130/60 | HR 54 | Temp 98.3°F | Wt 117.2 lb

## 2016-01-20 DIAGNOSIS — G8929 Other chronic pain: Secondary | ICD-10-CM

## 2016-01-20 DIAGNOSIS — M545 Low back pain: Secondary | ICD-10-CM | POA: Diagnosis not present

## 2016-01-20 DIAGNOSIS — I482 Chronic atrial fibrillation, unspecified: Secondary | ICD-10-CM

## 2016-01-20 DIAGNOSIS — H3530 Unspecified macular degeneration: Secondary | ICD-10-CM | POA: Insufficient documentation

## 2016-01-20 DIAGNOSIS — F329 Major depressive disorder, single episode, unspecified: Secondary | ICD-10-CM | POA: Diagnosis not present

## 2016-01-20 DIAGNOSIS — R197 Diarrhea, unspecified: Secondary | ICD-10-CM | POA: Diagnosis not present

## 2016-01-20 DIAGNOSIS — F32A Depression, unspecified: Secondary | ICD-10-CM

## 2016-01-20 NOTE — Patient Instructions (Signed)
Nice to see you. I'm glad her back pain is doing better. He should continue the tramadol and continue to follow-up with pain management. Please keep your eye doctor appointment. If your vision changes further please let them know. I would refrain from driving until you're able to see them. Please monitor for recurrence of her diarrhea. We will send her note to your cardiologist to see what they think regarding her sotalol and or tiredness. Her tiredness could just be related to depression. Please consider whether or not you want to try Zoloft or trazodone for depression. I would favor trazodone given your age.

## 2016-01-20 NOTE — Assessment & Plan Note (Signed)
Resolved. Benign abdominal exam. She'll monitor for recurrence.

## 2016-01-20 NOTE — Assessment & Plan Note (Signed)
Rate controlled. Sounds though she is in sinus bradycardia. Heart rate on my calculation 54. Wonder if the sotalol could be contributing to her symptoms of depression and tiredness. We will fax a note to patient's cardiologist regarding this and I advised patient to touch base with [place] and them next week if she had not heard anything.
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
## 2016-01-20 NOTE — Assessment & Plan Note (Signed)
Stable. Some depression over her medical issues. Discussed starting on trazodone versus Zoloft. Recommended trazodone. They will do some research. They will call medicine note they decide. Given return precautions.

## 2016-01-20 NOTE — Assessment & Plan Note (Signed)
Patient reports some increased difficulty seeing over the last 6 or so months. Already has an appointment with ophthalmology. Vision check today was 20/200 bilaterally and in both eyes. Some concern with this and her driving. Discussed with her and her family having her not drive. We'll send a request for driver reexamination form to the DMV.

## 2016-01-20 NOTE — Assessment & Plan Note (Signed)
Chronic issue. Improved with injections. No neurological abnormalities in her lower extremities. No red flags. She'll continue to follow with pain management.

## 2016-01-23 ENCOUNTER — Other Ambulatory Visit: Payer: Self-pay | Admitting: Family Medicine

## 2016-01-25 ENCOUNTER — Encounter: Payer: Self-pay | Admitting: Pain Medicine

## 2016-01-25 ENCOUNTER — Ambulatory Visit: Payer: Medicare Other | Attending: Pain Medicine | Admitting: Pain Medicine

## 2016-01-25 VITALS — BP 155/45 | HR 45 | Temp 97.9°F | Resp 16 | Ht 59.0 in | Wt 114.0 lb

## 2016-01-25 DIAGNOSIS — M545 Low back pain, unspecified: Secondary | ICD-10-CM

## 2016-01-25 DIAGNOSIS — F339 Major depressive disorder, recurrent, unspecified: Secondary | ICD-10-CM | POA: Insufficient documentation

## 2016-01-25 DIAGNOSIS — I5022 Chronic systolic (congestive) heart failure: Secondary | ICD-10-CM | POA: Diagnosis not present

## 2016-01-25 DIAGNOSIS — R109 Unspecified abdominal pain: Secondary | ICD-10-CM | POA: Diagnosis not present

## 2016-01-25 DIAGNOSIS — R197 Diarrhea, unspecified: Secondary | ICD-10-CM | POA: Diagnosis not present

## 2016-01-25 DIAGNOSIS — M5124 Other intervertebral disc displacement, thoracic region: Secondary | ICD-10-CM | POA: Diagnosis not present

## 2016-01-25 DIAGNOSIS — N390 Urinary tract infection, site not specified: Secondary | ICD-10-CM | POA: Diagnosis not present

## 2016-01-25 DIAGNOSIS — E785 Hyperlipidemia, unspecified: Secondary | ICD-10-CM | POA: Diagnosis not present

## 2016-01-25 DIAGNOSIS — R42 Dizziness and giddiness: Secondary | ICD-10-CM | POA: Insufficient documentation

## 2016-01-25 DIAGNOSIS — D649 Anemia, unspecified: Secondary | ICD-10-CM | POA: Insufficient documentation

## 2016-01-25 DIAGNOSIS — M47816 Spondylosis without myelopathy or radiculopathy, lumbar region: Secondary | ICD-10-CM | POA: Diagnosis not present

## 2016-01-25 DIAGNOSIS — M5136 Other intervertebral disc degeneration, lumbar region: Secondary | ICD-10-CM | POA: Insufficient documentation

## 2016-01-25 DIAGNOSIS — M1611 Unilateral primary osteoarthritis, right hip: Secondary | ICD-10-CM | POA: Diagnosis not present

## 2016-01-25 DIAGNOSIS — M8580 Other specified disorders of bone density and structure, unspecified site: Secondary | ICD-10-CM | POA: Diagnosis not present

## 2016-01-25 DIAGNOSIS — E039 Hypothyroidism, unspecified: Secondary | ICD-10-CM | POA: Insufficient documentation

## 2016-01-25 DIAGNOSIS — I10 Essential (primary) hypertension: Secondary | ICD-10-CM | POA: Diagnosis not present

## 2016-01-25 DIAGNOSIS — R739 Hyperglycemia, unspecified: Secondary | ICD-10-CM | POA: Insufficient documentation

## 2016-01-25 DIAGNOSIS — F419 Anxiety disorder, unspecified: Secondary | ICD-10-CM | POA: Diagnosis not present

## 2016-01-25 DIAGNOSIS — M4316 Spondylolisthesis, lumbar region: Secondary | ICD-10-CM | POA: Insufficient documentation

## 2016-01-25 DIAGNOSIS — M4186 Other forms of scoliosis, lumbar region: Secondary | ICD-10-CM | POA: Diagnosis not present

## 2016-01-25 DIAGNOSIS — G8929 Other chronic pain: Secondary | ICD-10-CM

## 2016-01-25 DIAGNOSIS — I4891 Unspecified atrial fibrillation: Secondary | ICD-10-CM | POA: Diagnosis not present

## 2016-01-25 DIAGNOSIS — H3530 Unspecified macular degeneration: Secondary | ICD-10-CM | POA: Insufficient documentation

## 2016-01-25 DIAGNOSIS — K219 Gastro-esophageal reflux disease without esophagitis: Secondary | ICD-10-CM | POA: Insufficient documentation

## 2016-01-25 NOTE — Progress Notes (Signed)
Patient's Name: Amanda Soto  Patient type: Established  MRN: [id number]  Service setting: Ambulatory outpatient  DOB: 18-Jul-1924  Location: [facility]  DOS: 01/25/2016  Primary Care Physician: [name], MD  Note by: Kathlen Brunswick. Dossie Arbour, M.D, DABA, DABAPM, DABPM, DABIPP, FIPP  Referring Physician: [name], MD  Specialty: Board-Certified Interventional Pain Management  Last Visit to Pain Management: 12/23/2015   Primary Reason(s) for Visit: Encounter for post-procedure evaluation of chronic illness with mild to moderate exacerbation CC: Back Pain (lower)   HPI  [name] is a 82 y.o. year old, female patient, who returns today as an established patient. She has Chronic systolic heart failure ([place]); Bradycardia; Atrial fibrillation ([place]); HTN (hypertension); Abdominal discomfort; Exertional shortness of breath; Elevated glucose; Hypothyroidism; Lightheadedness; Symptomatic anemia; Anemia; Anxiety; Degeneration of intervertebral disc of lumbar region; Recurrent major depressive disorder, in partial remission ([place]); Encounter for general adult medical examination without abnormal findings; Cardiac murmur; H/O neoplasm; HLD (hyperlipidemia); Adult hypothyroidism; MI (mitral incompetence); OP (osteoporosis); Chronic pain; Chronic low back pain (Location of Primary Source of Pain) (Right); Lumbar facet syndrome (Location of Primary Source of Pain) (Right); Grade 1 Anterolisthesis of L4 over L5 (5 mm); Lumbar spondylosis; Scoliosis of lumbar spine (concave to right side); Lumbar facet arthropathy; Osteoarthritis of hip (Right); Thoracic paracentral T7-8 disc protrusion; Depression; UTI (lower urinary tract infection); Bruising; Diarrhea; and Macular degeneration on her problem list.. Her primarily concern today is the Back Pain (lower)   Pain Assessment: Self-Reported Pain Score: 0-No pain             Reported level is compatible with observation       Pain  Type: Chronic pain Pain Location: Back Pain Orientation: Lower Pain Descriptors / Indicators:  (no pain today)  The patient comes into the clinics today for post-procedure evaluation on the interventional treatment done on 12/22/2015. She is currently having no pain but she does have weakness of the lower extremities with some bradycardia. She was recently checked by her primary care physician and told that she was okay. We have recommended that if she continues with the weakness to go back and have herself rechecked as it may be a cardiovascular problem as opposed to a neurological one. In addition, recent lab work shows a low hemoglobin and hematocrit.  Date of Last Visit: 12/22/15 Service Provided on Last Visit: Procedure (Right LUmbar facet block)  Post-Procedure Assessment  Procedure done on last visit: Diagnostic right-sided lumbar facet block #2 under fluoroscopic guidance and IV sedation. Side-effects or Adverse reactions: None reported Sedation: Sedation given  Results: Ultra-Short Term Relief (First 1 hour after procedure): 100 %  Analgesia during this period is likely to be Local Anesthetic and/or IV Sedative (Analgesic/Anxiolitic) related Short Term Relief (Initial 4-6 hrs after procedure): 100 % Complete relief would confirms area to be the source of pain Long Term Relief : 50 % (lasting a few weeks) Long-term benefit would suggest an inflammatory etiology to the pain   Current Relief (Now): 100%   Persistent relief would suggest effective anti-inflammatory effects from steroids Interpretation of Results: The results of both diagnostic test would support moving on to lumbar facet radiofrequency should the pain return.  Laboratory Chemistry  Inflammation Markers No results found for: ESRSEDRATE, CRP  Renal Function Lab Results  Component Value Date   BUN 14 11/10/2015   CREATININE 0.67 11/10/2015   GFRAA >60 11/10/2015   GFRNONAA >60 11/10/2015    Hepatic  Function Lab Results  Component Value Date   AST 25 08/25/2015   ALT 14 08/25/2015   ALBUMIN 3.9 08/25/2015    Electrolytes Lab Results  Component Value Date   NA 135 11/10/2015   K 3.6 11/10/2015   CL 102 11/10/2015   CALCIUM 8.4 (L) 11/10/2015   MG 2.1 11/10/2015    Pain Modulating Vitamins Lab Results  Component Value Date   VITAMINB12 227 11/10/2015    Coagulation Parameters Lab Results  Component Value Date   INR 1.3 02/05/2014   LABPROT 15.5 (H) 02/05/2014   APTT < 23.0 (L) 02/05/2014   PLT 328.0 12/21/2015    Cardiovascular Lab Results  Component Value Date   HGB 10.7 (L) 12/21/2015   HCT 34.4 (L) 12/21/2015    Note: Lab results reviewed.  Recent Diagnostic Imaging  Dg Ugi W/small Bowel  Result Date: 12/07/2015 CLINICAL DATA:  Recent blood transfusion; ? Bleeding. EXAM: UPPER GI SERIES WITH SMALL BOWEL FOLLOW-THROUGH FLUOROSCOPY TIME:  Radiation Exposure Index (as provided by the fluoroscopic device): 15.4 mGy TECHNIQUE: Combined double contrast and single contrast upper GI series using effervescent crystals, thick barium, and thin barium. Subsequently, serial images of the small bowel were obtained including spot views of the terminal ileum. COMPARISON:  None. FINDINGS: Examination of the esophagus demonstrated normal esophageal motility. Normal esophageal morphology without evidence of esophagitis or ulceration. No esophageal stricture, diverticula, or mass lesion. No evidence of hiatal hernia. There is mild gastroesophageal reflux. Examination of the stomach demonstrated normal rugal folds and areae gastricae. The gastric mucosa appeared unremarkable without evidence of ulceration, scarring, or mass lesion. Gastric motility and emptying was normal. Fluoroscopic examination of the duodenum demonstrates normal motility and morphology without evidence of ulceration or mass lesion. Medium density barium was periodically observed under fluoroscopy to travel from the  stomach to the ascending colon (over a 150 minute time period). There is no evidence of small bowel stricture or obstruction. No large filling defects to suggest mass lesion. In addition, there is no evidence of tethering or definite inflammatory changes present within the small bowel. IMPRESSION: 1. Mild gastroesophageal reflux. 2. Otherwise normal upper GI and small-bowel follow-through. Electronically Signed   By: Kathreen Devoid   On: 12/07/2015 12:33   Thoracic Imaging: Thoracic MR wo contrast:  Results for orders placed in visit on 07/18/07  [place] W/O Cm   Narrative * PRIOR REPORT IMPORTED FROM AN EXTERNAL SYSTEM *   PRIOR REPORT IMPORTED FROM THE [facility] [place] EXAM:    right flank pain  COMMENTS:   PROCEDURE:     MR  - MR THORACIC SPINE WO  - Jul 18 2007 11:24AM   RESULT:   HISTORY:  RIGHT flank pain.   COMPARISON STUDIES:   No prior.   PROCEDURE AND FINDINGS:  Multiplanar/multisequence imaging of the thoracic  spine was obtained.  A tiny mid to upper thoracic spine disc protrusion is  present. It is central to RIGHT paracentral with mild flattening of the  thecal sac. No evidence of thoracic cord distortion or significant spinal  stenosis.  This appears to be at T7-T8. No other focal disc protrusions  are  noted. There is no evidence of thoracic spinal stenosis.  The thoracic  cord  is normal.  No bony abnormality is identified.   IMPRESSION:   RIGHT paracentral small T7-T8 disc protrusion.  No significant spinal  stenosis or thoracic cord deformity noted.   Thank you for the opportunity to contribute  to the care of your patient.       Lumbosacral Imaging: Lumbar MR wo contrast:  Results for orders placed in visit on 10/24/12  [facility] W/O Cm   Narrative * PRIOR REPORT IMPORTED FROM AN EXTERNAL SYSTEM *   PRIOR REPORT IMPORTED FROM THE [facility] WORKFLOW SYSTEM   REASON FOR EXAM:    Lumbar spondylosis  COMMENTS:   PROCEDURE:      MR  - MR LUMBAR SPINE WO CONTRAST  - Oct 24 2012  1:11PM   RESULT:     Comparison is made to a prior study dated 12/19/2010.   Technique: Multiplanar and multisequence imaging of the lumbar spine was  obtained without the administration of gadolinium. The conus medullaris  terminates at a T12-L1 level. The cauda equina demonstrate no evidence of  clumping nor thickening.   At the T12-L1, L1-L2, L2-L3 levels, there is no evidence of significant  thecal sac stenosis nor neural foraminal narrowing.   At the L3-L4, L4-L5 and L5-S1 levels subligamentous disc bulges are  appreciated without evidence of thecal sac stenosis or neural foraminal  narrowing.   The osseous structures demonstrate no evidence of marrow edema.   IMPRESSION:   1. Multilevel mild degenerative disc disease changes without evidence of  thecal sac stenosis or neural foraminal narrowing. These findings are  unchanged when compared to previous study.   Thank you for the opportunity to contribute to the care of your patient.       Lumbar DG Bending views:  Results for orders placed during the hospital encounter of 11/14/15  DG Lumbar Spine Complete W/Bend   Narrative CLINICAL DATA:  Back pain.  EXAM: LUMBAR SPINE - COMPLETE WITH BENDING VIEWS  COMPARISON:  Bone scan 12/09/2014.  CT 11/02/2013.  FINDINGS: Degenerative changes lumbar spinal scoliosis concave right. 5 mm anterolisthesis L4 on L5. No acute bony abnormality . Aortoiliac atherosclerotic vascular disease. Renal vascular disease.  IMPRESSION: 1. Diffuse degenerative change lumbar spine with 5 mm anterolisthesis L4 on L5. Diffuse osteopenia. No acute bony abnormality.  2. Aortoiliac atherosclerotic vascular disease. Renal vascular disease .   Electronically Signed   By: Marcello Moores  Register   On: 11/15/2015 08:52    Note: Imaging results reviewed.  Meds  The patient has a current medication list which includes the following prescription(s):  acetaminophen, amitriptyline, apixaban, atorvastatin, difluprednate, docusate sodium, furosemide, iron polysaccharides, levothyroxine, multivitamin, omeprazole, sotalol, and tramadol.  Current Outpatient Prescriptions on File Prior to Visit  Medication Sig  . acetaminophen (TYLENOL) 325 MG tablet Take 650 mg by mouth every 6 (six) hours as needed for moderate pain, fever or headache.  Marland Kitchen apixaban (ELIQUIS) 2.5 MG TABS tablet Take 2.5 mg by mouth 2 (two) times daily.  Marland Kitchen atorvastatin (LIPITOR) 10 MG tablet Take 10 mg by mouth daily.  . iron polysaccharides (NIFEREX) 150 MG capsule Take 1 capsule (150 mg total) by mouth daily.  Marland Kitchen omeprazole (PRILOSEC) 20 MG capsule Take 20 mg by mouth daily.  . sotalol (BETAPACE) 80 MG tablet Take 80 mg by mouth 2 (two) times daily.  . traMADol (ULTRAM-ER) 200 MG 24 hr tablet TAKE 1 TABLET BY MOUTH DAILY   No current facility-administered medications on file prior to visit.     ROS  Constitutional: Denies any fever or chills Gastrointestinal: No reported hemesis, hematochezia, vomiting, or acute GI distress Musculoskeletal: Denies any acute onset joint swelling, redness, loss of ROM, or weakness Neurological: No reported episodes of acute onset apraxia, aphasia,  dysarthria, agnosia, amnesia, paralysis, loss of coordination, or loss of consciousness  Allergies  Ms. Ririe is allergic to hydrocodone; ciprofloxacin; and latex.  [place]  Medical:  Ms. Fitz  has a past medical history of Arthritis; Atrial fibrillation ([place]); CHF (congestive heart failure) ([place]); Chickenpox; Chronic abdominal pain (01/16/2014); Degenerative arthritis of hip (08/19/2014); Degenerative arthritis of lumbar spine (11/18/2013); Depression; Diverticulitis; GERD (gastroesophageal reflux disease); Heart murmur; Hyperlipidemia; Hypertension; Hypothyroid; Neuritis or radiculitis due to rupture of lumbar intervertebral disc (11/18/2013); Skin cancer; and Trochanteric bursitis of right hip  (11/18/2013). Family: family history includes Breast cancer in her mother and sister; Stroke in her maternal grandmother. Surgical:  has a past surgical history that includes Cataract extraction; Partial hysterectomy; Appendectomy; and Tonsillectomy. Tobacco:  reports that she has never smoked. She has never used smokeless tobacco. Alcohol:  reports that she does not drink alcohol. Drug:  reports that she does not use drugs.  Constitutional Exam  Vitals: Blood pressure (!) 155/45, pulse (!) 45, temperature 97.9 F (36.6 C), resp. rate 16, height 4\' 11"  (0000000 m), weight 114 lb (51.7 kg), SpO2 99 %. General appearance: Well nourished, well developed, and well hydrated. In no acute distress Calculated BMI/Body habitus: Body mass index is 23.03 kg/m. (18.5-24.9 kg/m2) Ideal body weight Psych/Mental status: Alert and oriented x 3 (person, place, & time) Eyes: PERLA Respiratory: No evidence of acute respiratory distress  Cervical Spine Exam  Inspection: No masses, redness, or swelling Alignment: Symmetrical ROM: Functional: ROM is within functional limits [facility]) Stability: No instability detected Muscle strength & Tone: Functionally intact Sensory: Unimpaired Palpation: No complaints of tenderness  Upper Extremity (UE) Exam    Side: Right upper extremity  Side: Left upper extremity  Inspection: No masses, redness, swelling, or asymmetry  Inspection: No masses, redness, swelling, or asymmetry  ROM:  ROM:  Functional: ROM is within functional limits [facility])        Functional: ROM is within functional limits [facility])        Muscle strength & Tone: Functionally intact  Muscle strength & Tone: Functionally intact  Sensory: Unimpaired  Sensory: Unimpaired  Palpation: No complaints of tenderness  Palpation: No complaints of tenderness   Thoracic Spine Exam  Inspection: No masses, redness, or swelling Alignment: Symmetrical ROM: Functional: ROM is within functional limits [facility]) Stability: No  instability detected Sensory: Unimpaired Muscle strength & Tone: Functionally intact Palpation: No complaints of tenderness  Lumbar Spine Exam  Inspection: No masses, redness, or swelling Alignment: Symmetrical ROM: Functional: ROM is within functional limits [facility]) Stability: No instability detected Muscle strength & Tone: Functionally intact Sensory: Unimpaired Palpation: No complaints of tenderness Provocative Tests: Lumbar Hyperextension and rotation test: provocative test deferred today       Patrick's Maneuver: provocative test deferred today              Gait & Posture Assessment  Ambulation: Unassisted Gait: Unaffected Posture: WNL   Lower Extremity Exam    Side: Right lower extremity  Side: Left lower extremity  Inspection: No masses, redness, swelling, or asymmetry ROM:  Inspection: No masses, redness, swelling, or asymmetry ROM:  Functional: ROM is within functional limits [facility])        Functional: ROM is within functional limits [facility])        Muscle strength & Tone: Functionally intact  Muscle strength & Tone: Functionally intact  Sensory: Unimpaired  Sensory: Unimpaired  Palpation: No complaints of tenderness  Palpation: No complaints of tenderness}   Assessment & Plan  Primary  Diagnosis & Pertinent Problem List: The primary encounter diagnosis was Lumbar facet syndrome (Location of Primary Source of Pain) (Right). Diagnoses of Chronic low back pain (Location of Primary Source of Pain) (Right) and Lumbar spondylosis, unspecified spinal osteoarthritis were also pertinent to this visit.  Visit Diagnosis: 1. Lumbar facet syndrome (Location of Primary Source of Pain) (Right)   2. Chronic low back pain (Location of Primary Source of Pain) (Right)   3. Lumbar spondylosis, unspecified spinal osteoarthritis     Problem-specific Plan(s): No problem-specific Assessment & Plan notes found for this encounter.   Plan of Care   Problem List Items Addressed This Visit       High   Chronic low back pain (Location of Primary Source of Pain) (Right) (Chronic)   Lumbar facet syndrome (Location of Primary Source of Pain) (Right) - Primary (Chronic)   Lumbar spondylosis (Chronic)    Other Visit Diagnoses   None.      Pharmacotherapy (Medications Ordered): No orders of the defined types were placed in this encounter.   Lab-work & Procedure Ordered: No orders of the defined types were placed in this encounter.   Imaging Ordered: None  Interventional Therapies: Scheduled:  None at this time.    Considering:  Right-sided lumbar facet radiofrequency ablation under fluoroscopic guidance and IV sedation.    PRN Procedures:  Right-sided lumbar facet radiofrequency ablation under fluoroscopic guidance and IV sedation.    Referral(s) or Consult(s): None at this time.  Medications administered during this visit: Ms. Smolka had no medications administered during this visit.  Requested PM Follow-up: Return if symptoms worsen or fail to improve, for (PRN) Procedure.  Future Appointments Date Time Provider [place]  04/23/2016 8:45 AM [name], MD [facility] None    Primary Care Physician: [name], MD Location: [facility] Note by: Kathlen Brunswick. Dossie Arbour, M.D, DABA, DABAPM, DABPM, DABIPP, FIPP  Pain Score Disclaimer: We use the NRS-11 scale. This is a self-reported, subjective measurement of pain severity with only modest accuracy. It is used primarily to identify changes within a particular patient. It must be understood that outpatient pain scales are significantly less accurate that those used for research, where they can be applied under ideal controlled circumstances with minimal exposure to variables. In reality, the score is likely to be a combination of pain intensity and pain affect, where pain affect describes the degree of emotional arousal or changes in action readiness caused by the sensory  experience of pain. Factors such as social and work situation, setting, emotional state, anxiety levels, expectation, and prior pain experience may influence pain perception and show large inter-individual differences that may also be affected by time variables.  Patient instructions provided during this appointment: Patient Instructions  Radiofrequency Lesioning Radiofrequency lesioning is a procedure that is performed to relieve pain. The procedure is often used for back, neck, or arm pain. Radiofrequency lesioning involves the use of a machine that creates radio waves to make heat. During the procedure, the heat is applied to the nerve that carries the pain signal. The heat damages the nerve and interferes with the pain signal. Pain relief usually lasts for 6 months to 1 year. LET [facility] CARE PROVIDER KNOW ABOUT:  Any allergies you have.  All medicines you are taking, including vitamins, herbs, eye drops, creams, and over-the-counter medicines.  Previous problems you or members of your family have had with the use of anesthetics.  Any blood disorders you have.  Previous surgeries  you have had.  Any medical conditions you have.  Whether you are pregnant or may be pregnant. RISKS AND COMPLICATIONS Generally, this is a safe procedure. However, problems may occur, including:  Pain or soreness at the injection site.  Infection at the injection site.  Damage to nerves or blood vessels. BEFORE THE PROCEDURE  Ask your health care provider about:  Changing or stopping your regular medicines. This is especially important if you are taking diabetes medicines or blood thinners.  Taking medicines such as aspirin and ibuprofen. These medicines can thin your blood. Do not take these medicines before your procedure if your health care provider instructs you not to.  Follow instructions from your health care provider about eating or drinking restrictions.  Plan to have someone take you  home after the procedure.  If you go home right after the procedure, plan to have someone with you for 24 hours. PROCEDURE  You will be given one or more of the following:  A medicine to help you relax (sedative).  A medicine to numb the area (local anesthetic).  You will be awake during the procedure. You will need to be able to talk with the health care provider during the procedure.  With the help of a type of X-ray (fluoroscopy), the health care provider will insert a radiofrequency needle into the area to be treated.  Next, a wire that carries the radio waves (electrode) will be put through the radiofrequency needle. An electrical pulse will be sent through the electrode to verify the correct nerve. You will feel a tingling sensation, and you may have muscle twitching.  Then, the tissue that is around the needle tip will be heated by an electric current that is passed using the radiofrequency machine. This will numb the nerves.  A bandage (dressing) will be put on the insertion area after the procedure is done. The procedure may vary among health care providers and hospitals. AFTER THE PROCEDURE  Your blood pressure, heart rate, breathing rate, and blood oxygen level will be monitored often until the medicines you were given have worn off.  Return to your normal activities as directed by your health care provider.   This information is not intended to replace advice given to you by your health care provider. Make sure you discuss any questions you have with your health care provider.   Document Released: 02/14/2011 Document Revised: 03/09/2015 Document Reviewed: 07/26/2014 Elsevier Interactive Patient Education [employer].

## 2016-01-25 NOTE — Progress Notes (Signed)
Safety precautions to be maintained throughout the outpatient stay will include: orient to surroundings, keep bed in low position, maintain call bell within reach at all times, provide assistance with transfer out of bed and ambulation.  

## 2016-01-25 NOTE — Patient Instructions (Signed)
Radiofrequency Lesioning Radiofrequency lesioning is a procedure that is performed to relieve pain. The procedure is often used for back, neck, or arm pain. Radiofrequency lesioning involves the use of a machine that creates radio waves to make heat. During the procedure, the heat is applied to the nerve that carries the pain signal. The heat damages the nerve and interferes with the pain signal. Pain relief usually lasts for 6 months to 1 year. LET YOUR HEALTH CARE PROVIDER KNOW ABOUT:  Any allergies you have.  All medicines you are taking, including vitamins, herbs, eye drops, creams, and over-the-counter medicines.  Previous problems you or members of your family have had with the use of anesthetics.  Any blood disorders you have.  Previous surgeries you have had.  Any medical conditions you have.  Whether you are pregnant or may be pregnant. RISKS AND COMPLICATIONS Generally, this is a safe procedure. However, problems may occur, including:  Pain or soreness at the injection site.  Infection at the injection site.  Damage to nerves or blood vessels. BEFORE THE PROCEDURE  Ask your health care provider about:  Changing or stopping your regular medicines. This is especially important if you are taking diabetes medicines or blood thinners.  Taking medicines such as aspirin and ibuprofen. These medicines can thin your blood. Do not take these medicines before your procedure if your health care provider instructs you not to.  Follow instructions from your health care provider about eating or drinking restrictions.  Plan to have someone take you home after the procedure.  If you go home right after the procedure, plan to have someone with you for 24 hours. PROCEDURE  You will be given one or more of the following:  A medicine to help you relax (sedative).  A medicine to numb the area (local anesthetic).  You will be awake during the procedure. You will need to be able to  talk with the health care provider during the procedure.  With the help of a type of X-ray (fluoroscopy), the health care provider will insert a radiofrequency needle into the area to be treated.  Next, a wire that carries the radio waves (electrode) will be put through the radiofrequency needle. An electrical pulse will be sent through the electrode to verify the correct nerve. You will feel a tingling sensation, and you may have muscle twitching.  Then, the tissue that is around the needle tip will be heated by an electric current that is passed using the radiofrequency machine. This will numb the nerves.  A bandage (dressing) will be put on the insertion area after the procedure is done. The procedure may vary among health care providers and hospitals. AFTER THE PROCEDURE  Your blood pressure, heart rate, breathing rate, and blood oxygen level will be monitored often until the medicines you were given have worn off.  Return to your normal activities as directed by your health care provider.   This information is not intended to replace advice given to you by your health care provider. Make sure you discuss any questions you have with your health care provider.   Document Released: 02/14/2011 Document Revised: 03/09/2015 Document Reviewed: 07/26/2014 Elsevier Interactive Patient Education 2016 [employer]  

## 2016-02-06 NOTE — Progress Notes (Signed)
Called and scheduled patient to see Dr. Clayborn Bigness  On August 22nd

## 2016-02-15 DIAGNOSIS — H353132 Nonexudative age-related macular degeneration, bilateral, intermediate dry stage: Secondary | ICD-10-CM | POA: Diagnosis not present

## 2016-02-21 DIAGNOSIS — I480 Paroxysmal atrial fibrillation: Secondary | ICD-10-CM | POA: Diagnosis not present

## 2016-02-21 DIAGNOSIS — M545 Low back pain: Secondary | ICD-10-CM | POA: Diagnosis not present

## 2016-02-21 DIAGNOSIS — I499 Cardiac arrhythmia, unspecified: Secondary | ICD-10-CM | POA: Diagnosis not present

## 2016-02-21 DIAGNOSIS — R001 Bradycardia, unspecified: Secondary | ICD-10-CM | POA: Diagnosis not present

## 2016-02-21 DIAGNOSIS — D649 Anemia, unspecified: Secondary | ICD-10-CM | POA: Diagnosis not present

## 2016-02-21 DIAGNOSIS — R5383 Other fatigue: Secondary | ICD-10-CM | POA: Diagnosis not present

## 2016-02-29 ENCOUNTER — Other Ambulatory Visit: Payer: Self-pay | Admitting: Family Medicine

## 2016-02-29 NOTE — Telephone Encounter (Signed)
Last refill 01/23/16. Last seen 01/20/16.

## 2016-02-29 NOTE — Telephone Encounter (Signed)
Please determine if the patient is taking amitriptyline at this time. There is potential for interaction between the amitriptyline and the tramadol if she is taking this medication. Thanks.

## 2016-03-13 DIAGNOSIS — H26499 Other secondary cataract, unspecified eye: Secondary | ICD-10-CM | POA: Diagnosis not present

## 2016-03-20 DIAGNOSIS — R001 Bradycardia, unspecified: Secondary | ICD-10-CM | POA: Diagnosis not present

## 2016-03-20 DIAGNOSIS — R5383 Other fatigue: Secondary | ICD-10-CM | POA: Diagnosis not present

## 2016-03-20 DIAGNOSIS — I480 Paroxysmal atrial fibrillation: Secondary | ICD-10-CM | POA: Diagnosis not present

## 2016-03-20 DIAGNOSIS — H26499 Other secondary cataract, unspecified eye: Secondary | ICD-10-CM | POA: Diagnosis not present

## 2016-03-20 DIAGNOSIS — I499 Cardiac arrhythmia, unspecified: Secondary | ICD-10-CM | POA: Diagnosis not present

## 2016-03-20 DIAGNOSIS — M545 Low back pain: Secondary | ICD-10-CM | POA: Diagnosis not present

## 2016-04-02 ENCOUNTER — Other Ambulatory Visit: Payer: Self-pay | Admitting: Family Medicine

## 2016-04-06 ENCOUNTER — Telehealth: Payer: Self-pay

## 2016-04-06 NOTE — Telephone Encounter (Signed)
Patient called concerning weakness after facet injection.  Discussed with Dr Dossie Arbour.  Patient informed that the weakness should go away, but ir normal for post facet block.  Patient states that she has been pain free.  Dr Dossie Arbour states when pain returns, patient should call for facet block instead of RF since pain relief has lasted so long.  Patient denies further questions or concerns.

## 2016-04-18 ENCOUNTER — Telehealth: Payer: Self-pay | Admitting: Pain Medicine

## 2016-04-18 ENCOUNTER — Other Ambulatory Visit: Payer: Self-pay

## 2016-04-18 DIAGNOSIS — M47816 Spondylosis without myelopathy or radiculopathy, lumbar region: Secondary | ICD-10-CM

## 2016-04-18 NOTE — Telephone Encounter (Signed)
Patient called asking to set up appt for Lumbar Facet Block, need order in please/ last 3 orders were already performed. Patient states phys. Told her she would not have to come in for follow up before having another facet block.

## 2016-04-18 NOTE — Telephone Encounter (Signed)
Dr Dossie Arbour placed order for facet block.  Juliann Pulse notified.

## 2016-04-23 ENCOUNTER — Telehealth: Payer: Self-pay | Admitting: *Deleted

## 2016-04-23 ENCOUNTER — Encounter: Payer: Medicare Other | Admitting: Family Medicine

## 2016-04-23 NOTE — Telephone Encounter (Signed)
Spoke with Juliann Pulse who states that Angie was getting procedure prior authorized.  Will call patient and notify her.

## 2016-04-25 ENCOUNTER — Encounter: Payer: Self-pay | Admitting: Family Medicine

## 2016-04-25 ENCOUNTER — Ambulatory Visit (INDEPENDENT_AMBULATORY_CARE_PROVIDER_SITE_OTHER): Payer: Medicare Other | Admitting: Family Medicine

## 2016-04-25 DIAGNOSIS — Z23 Encounter for immunization: Secondary | ICD-10-CM | POA: Diagnosis not present

## 2016-04-25 MED ORDER — TRAMADOL HCL ER 300 MG PO TB24: 300.0000 mg | ORAL_TABLET | Freq: Every day | ORAL | 1 refills | Status: DC

## 2016-04-25 NOTE — Progress Notes (Signed)
  [name], MD Phone: [phone]  Amanda Soto is a 82 y.o. female who presents today for follow-up.  A. fib: No palpitations, chest pain, shortness of breath, or bleeding. Currently taking Eliquis. Also on sotalol. Feels better since her cardiologist decreased her sotalol dose. Less depression.  Depression: Notes she does feel down and sometimes anxious. This is somewhat better. She thinks she would feel better if she could get back out and do the things she usually does. She has to go take a road test to evaluate her driving ability. She failed the vision test though she went saw her eye doctor and has gotten a new prescription though she needs to pick up her glasses. When she does that she can take the driving test. No SI.  Chronic back pain: Has worsened recently. Taking tramadol extended release 200 mg daily though has had to double up on this recently. Did have a facet injection previously that lasted about 4 months. She notes no radiation, numbness, weakness, loss of bowel or bladder function, saddle anesthesia, or fevers.  PMH: nonsmoker.   ROS see history of present illness  Objective  Physical Exam Vitals:   04/25/16 1427  BP: 108/62  Pulse: (!) 50  Temp: 98.2 F (36.8 C)    BP Readings from Last 3 Encounters:  04/25/16 108/62  01/25/16 (!) 155/45  01/20/16 130/60   Wt Readings from Last 3 Encounters:  04/25/16 120 lb (54.4 kg)  01/25/16 114 lb (51.7 kg)  01/20/16 117 lb 3.2 oz (53.2 kg)    Physical Exam  Constitutional: She is well-developed, well-nourished, and in no distress.  HENT:  Head: Normocephalic and atraumatic.  Cardiovascular: Normal heart sounds.   Bradycardic  Pulmonary/Chest: Effort normal and breath sounds normal.  Musculoskeletal:  No midline spine tenderness, no midline spine step-off, no muscular back tenderness  Neurological: She is alert.  5 out of 5 strength bilateral quads, hamstrings, plantar flexion, and dorsiflexion,  sensation to light touch intact in bilateral lower extremities  Skin: Skin is warm and dry.     Assessment/Plan: Please see individual problem list.  Atrial fibrillation ([place]) Rate controlled. Sinus bradycardia. Continue current medications. Continue to follow with cardiology.  Depression Stable. Mostly related to her inability to drive and get around. Offered medication or therapy referral though she declined at this time opting to see how she does after doing the road test for her driver's license.  Chronic low back pain (Location of Primary Source of Pain) (Right) Chronic issue. Worsened recently. Had improvement with injections. No neurological abnormalities in her lower extremities. No red flags. I advised that she is taking too much tramadol when she takes 400 mg a day. We will provide her with a prescription for 300 mg tablets and advised her not to take more than 1 tablet a day. She'll follow-up for repeat injection.   Orders Placed This Encounter  Procedures  . Flu vaccine HIGH DOSE PF    Meds ordered this encounter  Medications  . traMADol (ULTRAM-ER) 300 MG 24 hr tablet    Sig: Take 1 tablet (300 mg total) by mouth daily.    Dispense:  30 tablet    Refill:  1    Not to exceed 5 additional fills before 08/28/2016.    [name], MD [place]

## 2016-04-25 NOTE — Assessment & Plan Note (Signed)
Rate controlled. Sinus bradycardia. Continue current medications. Continue to follow with cardiology.

## 2016-04-25 NOTE — Assessment & Plan Note (Signed)
Chronic issue. Worsened recently. Had improvement with injections. No neurological abnormalities in her lower extremities. No red flags. I advised that she is taking too much tramadol when she takes 400 mg a day. We will provide her with a prescription for 300 mg tablets and advised her not to take more than 1 tablet a day. She'll follow-up for repeat injection.

## 2016-04-25 NOTE — Assessment & Plan Note (Signed)
Stable. Mostly related to her inability to drive and get around. Offered medication or therapy referral though she declined at this time opting to see how she does after doing the road test for her driver's license.

## 2016-04-25 NOTE — Progress Notes (Signed)
Pre visit review using our clinic review tool, if applicable. No additional management support is needed unless otherwise documented below in the visit note. 

## 2016-04-25 NOTE — Patient Instructions (Signed)
Nice to see you. We are going to increase your tramadol to 300 mg daily. Please do not take anymore than this dose. Please monitor your depression and if this worsens please let us know. Please go do your driving test.

## 2016-05-01 ENCOUNTER — Ambulatory Visit: Payer: Medicare Other | Admitting: Pain Medicine

## 2016-05-01 ENCOUNTER — Encounter: Payer: Self-pay | Admitting: Pain Medicine

## 2016-05-01 NOTE — Progress Notes (Signed)
Safety precautions to be maintained throughout the outpatient stay will include: orient to surroundings, keep bed in low position, maintain call bell within reach at all times, provide assistance with transfer out of bed and ambulation.  Pt had Flu Shot 04/25/16

## 2016-05-07 ENCOUNTER — Other Ambulatory Visit: Payer: Self-pay | Admitting: Family Medicine

## 2016-05-07 NOTE — Telephone Encounter (Signed)
Please advise on refill. Historical provider.  

## 2016-05-08 NOTE — Telephone Encounter (Signed)
Sent to pharmacy 

## 2016-05-09 ENCOUNTER — Encounter: Payer: Self-pay | Admitting: Pain Medicine

## 2016-05-09 ENCOUNTER — Ambulatory Visit (HOSPITAL_BASED_OUTPATIENT_CLINIC_OR_DEPARTMENT_OTHER): Payer: Medicare Other | Admitting: Pain Medicine

## 2016-05-09 ENCOUNTER — Ambulatory Visit
Admission: RE | Admit: 2016-05-09 | Discharge: 2016-05-09 | Disposition: A | Discharge status: Home / Self Care | Payer: Medicare Other | Source: Ambulatory Visit | Attending: Pain Medicine | Admitting: Pain Medicine

## 2016-05-09 VITALS — BP 137/63 | HR 49 | Temp 96.8°F | Resp 22 | Ht 59.5 in | Wt 117.0 lb

## 2016-05-09 DIAGNOSIS — G8929 Other chronic pain: Secondary | ICD-10-CM | POA: Insufficient documentation

## 2016-05-09 DIAGNOSIS — M47816 Spondylosis without myelopathy or radiculopathy, lumbar region: Secondary | ICD-10-CM

## 2016-05-09 DIAGNOSIS — Z79899 Other long term (current) drug therapy: Secondary | ICD-10-CM | POA: Insufficient documentation

## 2016-05-09 DIAGNOSIS — Z888 Allergy status to other drugs, medicaments and biological substances status: Secondary | ICD-10-CM | POA: Diagnosis not present

## 2016-05-09 DIAGNOSIS — M47896 Other spondylosis, lumbar region: Secondary | ICD-10-CM | POA: Insufficient documentation

## 2016-05-09 DIAGNOSIS — M1288 Other specific arthropathies, not elsewhere classified, other specified site: Secondary | ICD-10-CM

## 2016-05-09 DIAGNOSIS — M545 Low back pain: Secondary | ICD-10-CM | POA: Diagnosis not present

## 2016-05-09 MED ORDER — MIDAZOLAM HCL 5 MG/5ML IJ SOLN
INTRAMUSCULAR | Status: AC
  Administered 2016-05-09: 0.5 mg
  Filled 2016-05-09: qty 5

## 2016-05-09 MED ORDER — FENTANYL CITRATE (PF) 100 MCG/2ML IJ SOLN: 25.0000 ug | INTRAMUSCULAR | Status: DC | PRN

## 2016-05-09 MED ORDER — TRIAMCINOLONE ACETONIDE 40 MG/ML IJ SUSP
INTRAMUSCULAR | Status: AC
  Administered 2016-05-09: 10:00:00
  Filled 2016-05-09: qty 1

## 2016-05-09 MED ORDER — LIDOCAINE HCL (PF) 1 % IJ SOLN
INTRAMUSCULAR | Status: AC
  Administered 2016-05-09: 10:00:00
  Filled 2016-05-09: qty 5

## 2016-05-09 MED ORDER — ROPIVACAINE HCL 2 MG/ML IJ SOLN: 9.0000 mL | Freq: Once | INTRAMUSCULAR | Status: DC

## 2016-05-09 MED ORDER — FENTANYL CITRATE (PF) 100 MCG/2ML IJ SOLN
INTRAMUSCULAR | Status: AC
  Filled 2016-05-09: qty 2

## 2016-05-09 MED ORDER — LIDOCAINE HCL (PF) 1 % IJ SOLN: 10.0000 mL | Freq: Once | INTRAMUSCULAR | Status: DC

## 2016-05-09 MED ORDER — ROPIVACAINE HCL 2 MG/ML IJ SOLN
INTRAMUSCULAR | Status: AC
  Administered 2016-05-09: 10:00:00
  Filled 2016-05-09: qty 10

## 2016-05-09 MED ORDER — TRIAMCINOLONE ACETONIDE 40 MG/ML IJ SUSP: 40.0000 mg | Freq: Once | INTRAMUSCULAR | Status: DC

## 2016-05-09 MED ORDER — LACTATED RINGERS IV SOLN: 1000.0000 mL | Freq: Once | INTRAVENOUS | Status: DC

## 2016-05-09 MED ORDER — MIDAZOLAM HCL 5 MG/5ML IJ SOLN: 1.0000 mg | INTRAMUSCULAR | Status: DC | PRN

## 2016-05-09 NOTE — Patient Instructions (Signed)

## 2016-05-09 NOTE — Progress Notes (Signed)
Safety precautions to be maintained throughout the outpatient stay will include: orient to surroundings, keep bed in low position, maintain call bell within reach at all times, provide assistance with transfer out of bed and ambulation.  

## 2016-05-09 NOTE — Progress Notes (Signed)
Patient's Name: Amanda Soto  MRN: [id number]  Referring Provider: [name], MD  DOB: [birth year]  PCP: [name], MD  DOS: 05/09/2016  Note by: Kathlen Brunswick. Dossie Arbour, MD  Service setting: Ambulatory outpatient  Location: [facility] (AMB) Pain Management Facility  Visit type: Procedure  Specialty: Interventional Pain Management  Patient type: Established   Primary Reason for Visit: Interventional Pain Management Treatment. CC: Back Pain (lower)  Procedure:  Anesthesia, Analgesia, Anxiolysis:  Type: Palliative Medial Branch Facet Block #3 Region: Lumbar Level: L2, L3, L4, L5, & S1 Medial Branch Level(s) Laterality: Right  Type: Local Anesthesia with Moderate (Conscious) Sedation Local Anesthetic: Lidocaine 1% Route: Intravenous (IV) IV Access: Secured Sedation: Meaningful verbal contact was maintained at all times during the procedure  Indication(s): Analgesia and Anxiety  Indications: 1. Lumbar facet syndrome (Location of Primary Source of Pain) (Right)   2. Lumbar facet arthropathy   3. Lumbar spondylosis   4. Chronic low back pain (Location of Primary Source of Pain) (Right)    Pain Score: Pre-procedure: 5  (when walking- 0 when sitting)/10 Post-procedure: 0-No pain/10  Pre-Procedure Assessment:  Amanda Soto is a 82 y.o. (year old), female patient, seen today for interventional treatment. She  has a past surgical history that includes Cataract extraction; Partial hysterectomy; Appendectomy; and Tonsillectomy.. Her primarily concern today is the Back Pain (lower) The primary encounter diagnosis was Lumbar facet syndrome (Location of Primary Source of Pain) (Right). Diagnoses of Lumbar facet arthropathy, Lumbar spondylosis, and Chronic low back pain (Location of Primary Source of Pain) (Right) were also pertinent to this visit.  Pain Type: Chronic pain Pain Location: Back Pain Orientation: Lower, Right Pain Descriptors / Indicators: Stabbing, Sharp Pain Frequency:  Constant  Date of Last Visit: 01/25/16 Service Provided on Last Visit: Procedure (was going to have procedure but was cancelled  10/31due to flu shot)  Coagulation Parameters Lab Results  Component Value Date   INR 1.3 02/05/2014   LABPROT 15.5 (H) 02/05/2014   APTT < 23.0 (L) 02/05/2014   PLT 328.0 12/21/2015   Verification of the correct person, correct site (including marking of site), and correct procedure were performed and confirmed by the patient.  Consent: Before the procedure and under the influence of no sedative(s), amnesic(s), or anxiolytics, the patient was informed of the treatment options, risks and possible complications. To fulfill our ethical and legal obligations, as recommended by the American Medical Association's Code of Ethics, I have informed the patient of my clinical impression; the nature and purpose of the treatment or procedure; the risks, benefits, and possible complications of the intervention; the alternatives, including doing nothing; the risk(s) and benefit(s) of the alternative treatment(s) or procedure(s); and the risk(s) and benefit(s) of doing nothing. The patient was provided information about the general risks and possible complications associated with the procedure. These may include, but are not limited to: failure to achieve desired goals, infection, bleeding, organ or nerve damage, allergic reactions, paralysis, and death. In addition, the patient was informed of those risks and complications associated to Spine-related procedures, such as failure to decrease pain; infection (i.e.: Meningitis, epidural or intraspinal abscess); bleeding (i.e.: epidural hematoma, subarachnoid hemorrhage, or any other type of intraspinal or peri-dural bleeding); organ or nerve damage (i.e.: Any type of peripheral nerve, nerve root, or spinal cord injury) with subsequent damage to sensory, motor, and/or autonomic systems, resulting in permanent pain, numbness, and/or weakness  of one or several areas of the body; allergic reactions; (i.e.: anaphylactic reaction); and/or death.  Furthermore, the patient was informed of those risks and complications associated with the medications. These include, but are not limited to: allergic reactions (i.e.: anaphylactic or anaphylactoid reaction(s)); adrenal axis suppression; blood sugar elevation that in diabetics may result in ketoacidosis or comma; water retention that in patients with history of congestive heart failure may result in shortness of breath, pulmonary edema, and decompensation with resultant heart failure; weight gain; swelling or edema; medication-induced neural toxicity; particulate matter embolism and blood vessel occlusion with resultant organ, and/or nervous system infarction; and/or aseptic necrosis of one or more joints. Finally, the patient was informed that Medicine is not an exact science; therefore, there is also the possibility of unforeseen or unpredictable risks and/or possible complications that may result in a catastrophic outcome. The patient indicated having understood very clearly. We have given the patient no guarantees and we have made no promises. Enough time was given to the patient to ask questions, all of which were answered to the patient's satisfaction. Amanda Soto has indicated that she wanted to continue with the procedure.  Consent Attestation: I, the ordering provider, attest that I have discussed with the patient the benefits, risks, side-effects, alternatives, likelihood of achieving goals, and potential problems during recovery for the procedure that I have provided informed consent.  Pre-Procedure Preparation:  Safety Precautions: Allergies reviewed. The patient was asked about blood thinners, or active infections, both of which were denied. The patient was asked to confirm the procedure and laterality, before marking the site, and again before commencing the procedure. Appropriate site,  procedure, and patient were confirmed by following the Joint Commission's Universal Protocol (UP.01.01.01), in the form of a "Time Out". The patient was asked to participate by confirming the accuracy of the "Time Out" information. Patient was assessed for positional comfort and pressure points before starting the procedure. Allergies: She is allergic to hydrocodone; ciprofloxacin; and latex. Allergy Precautions: None required Infection Control Precautions: Sterile technique used. Standard Universal Precautions were taken as recommended by the Department of [facility] for Disease Control and Prevention (CDC). Standard pre-surgical skin prep was conducted. Respiratory hygiene and cough etiquette was practiced. Hand hygiene observed. Safe injection practices and needle disposal techniques followed. SDV (single dose vial) medications used. Medications properly checked for expiration dates and contaminants. Personal protective equipment (PPE) used as per protocol. Monitoring:  As per clinic protocol. Vitals:   05/09/16 0943 05/09/16 0948 05/09/16 0953 05/09/16 1003  BP: (!) 159/47 (!) 156/74 (!) 125/58 134/60  Pulse: (!) 53 (!) 54 (!) 52 (!) 47  Resp: 16 18 11  (!) 22  Temp:      SpO2: 99% 97% 99% 97%  Weight:      Height:      Calculated BMI: Body mass index is 23.24 kg/m. Time-out: "Time-out" completed before starting procedure, as per protocol.  Description of Procedure Process:   Time-out: "Time-out" completed before starting procedure, as per protocol. Position: Prone Target Area: For Lumbar Facet blocks, the target is the groove formed by the junction of the transverse process and superior articular process. For the L5 dorsal ramus, the target is the notch between superior articular process and sacral ala. For the S1 dorsal ramus, the target is the superior and lateral edge of the posterior S1 Sacral foramen. Approach: Paramedial approach. Area Prepped: Entire Posterior Lumbosacral  Region Prepping solution: ChloraPrep (2% chlorhexidine gluconate and 70% isopropyl alcohol) Safety Precautions: Aspiration looking for blood return was conducted prior to all injections. At no point did we inject  any substances, as a needle was being advanced. No attempts were made at seeking any paresthesias. Safe injection practices and needle disposal techniques used. Medications properly checked for expiration dates. SDV (single dose vial) medications used. Description of the Procedure: Protocol guidelines were followed. The patient was placed in position over the fluoroscopy table. The target area was identified and the area prepped in the usual manner. Skin desensitized using vapocoolant spray. Skin & deeper tissues infiltrated with local anesthetic. Appropriate amount of time allowed to pass for local anesthetics to take effect. The procedure needle was introduced through the skin, ipsilateral to the reported pain, and advanced to the target area. Employing the "Medial Branch Technique", the needles were advanced to the angle made by the superior and medial portion of the transverse process, and the lateral and inferior portion of the superior articulating process of the targeted vertebral bodies. This area is known as "Burton's Eye" or the "Eye of the [place] Dog". A procedure needle was introduced through the skin, and this time advanced to the angle made by the superior and medial border of the sacral ala, and the lateral border of the S1 vertebral body. This last needle was later repositioned at the superior and lateral border of the posterior S1 foramen. Negative aspiration confirmed. Solution injected in intermittent fashion, asking for systemic symptoms every 0.5cc of injectate. The needles were then removed and the area cleansed, making sure to leave some of the prepping solution back to take advantage of its long term bactericidal properties. EBL: None Materials & Medications Used:  Needle(s)  Used: 22g - 3.5" Spinal Needle(s)   Illustration of the posterior view of the lumbar spine and the posterior neural structures. Laminae of L2 through S1 are labeled. DPRL5, dorsal primary ramus of L5; DPRS1, dorsal primary ramus of S1; DPR3, dorsal primary ramus of L3; FJ, facet (zygapophyseal) joint L3-L4; I, inferior articular process of L4; LB1, lateral branch of dorsal primary ramus of L1; IAB, inferior articular branches from L3 medial branch (supplies L4-L5 facet joint); IBP, intermediate branch plexus; MB3, medial branch of dorsal primary ramus of L3; NR3, third lumbar nerve root; S, superior articular process of L5; SAB, superior articular branches from L4 (supplies L4-5 facet joint also); TP3, transverse process of L3.  Imaging Guidance (Spinal):  Type of Imaging Technique: Fluoroscopy Guidance (Spinal) Indication(s): Assistance in needle guidance and placement for procedures requiring needle placement in or near specific anatomical locations not easily accessible without such assistance. Exposure Time: Please see nurses notes. Contrast: None used. Fluoroscopic Guidance: I was personally present during the use of fluoroscopy. "Tunnel Vision Technique" used to obtain the best possible view of the target area. Parallax error corrected before commencing the procedure. "Direction-depth-direction" technique used to introduce the needle under continuous pulsed fluoroscopy. Once target was reached, antero-posterior, oblique, and lateral fluoroscopic projection used confirm needle placement in all planes. Images permanently stored in EMR. Interpretation: No contrast injected. I personally interpreted the imaging intraoperatively. Adequate needle placement confirmed in multiple planes. Permanent images saved into the patient's record.  Antibiotic Prophylaxis:  Indication(s): No indications identified. Type:  Antibiotics Given (last 72 hours)    None      Post-operative Assessment:   Complications: No immediate post-treatment complications observed by team, or reported by patient. Disposition: The patient tolerated the entire procedure well. A repeat set of vitals were taken after the procedure and the patient was kept under observation following institutional policy, for this type of procedure. Post-procedural neurological assessment was  performed, showing return to baseline, prior to discharge. The patient was provided with post-procedure discharge instructions, including a section on how to identify potential problems. Should any problems arise concerning this procedure, the patient was given instructions to immediately contact us, at any time, without hesitation. In any case, we plan to contact the patient by telephone for a follow-up status report regarding this interventional procedure. Comments:  No additional relevant information.  Plan of Care  Discharge to: Discharge home  Medications ordered for procedure: Meds ordered this encounter  Medications  . fentaNYL (SUBLIMAZE) injection 25-50 mcg    Make sure Narcan is available in the pyxis when using this medication. In the event of respiratory depression (RR< 8/min): Titrate NARCAN (naloxone) in increments of 0.1 to 0.2 mg IV at 2-3 minute intervals, until desired degree of reversal.  . lactated ringers infusion 1,000 mL  . midazolam (VERSED) 5 MG/5ML injection 1-2 mg    Make sure Flumazenil is available in the pyxis when using this medication. If oversedation occurs, administer 0.2 mg IV over 15 sec. If after 45 sec no response, administer 0.2 mg again over 1 min; may repeat at 1 min intervals; not to exceed 4 doses (1 mg)  . triamcinolone acetonide (KENALOG-40) injection 40 mg  . lidocaine (PF) (XYLOCAINE) 1 % injection 10 mL  . ropivacaine (PF) 2 mg/ml (0.2%) (NAROPIN) epidural 9 mL  . ropivacaine (PF) 2 mg/ml (0.2%) (NAROPIN) 2 MG/ML epidural    TICE, KORI: cabinet override  . lidocaine (PF) (XYLOCAINE) 1 %  injection    TICE, KORI: cabinet override  . triamcinolone acetonide (KENALOG-40) 40 MG/ML injection    TICE, KORI: cabinet override  . fentaNYL (SUBLIMAZE) 100 MCG/2ML injection    TICE, KORI: cabinet override  . midazolam (VERSED) 5 MG/5ML injection    TICE, KORI: cabinet override   Medications administered: (For more details, see medical record) We administered ropivacaine (PF) 2 mg/ml (0.2%), lidocaine (PF), triamcinolone acetonide, and midazolam. Lab-work, Procedure(s), & Referral(s) Ordered: Orders Placed This Encounter  Procedures  . LUMBAR FACET(MEDIAL BRANCH NERVE BLOCK) MBNB  . DG C-Arm 1-60 Min-No Report   Imaging Ordered: No results found for this or any previous visit. New Prescriptions   No medications on file   Physician-requested Follow-up:  Return in about 2 weeks (around 05/23/2016) for Post-Procedure evaluation.  Future Appointments Date Time Provider Young  07/25/2016 2:30 PM [name], MD [facility] None   Primary Care Physician: [name], MD Location: [facility] Note by: Kathlen Brunswick. Dossie Arbour, M.D, DABA, DABAPM, DABPM, DABIPP, FIPP  Disclaimer:  Medicine is not an exact science. The only guarantee in medicine is that nothing is guaranteed. It is important to note that the decision to proceed with this intervention was based on the information collected from the patient. The Data and conclusions were drawn from the patient's questionnaire, the interview, and the physical examination. Because the information was provided in large part by the patient, it cannot be guaranteed that it has not been purposely or unconsciously manipulated. Every effort has been made to obtain as much relevant data as possible for this evaluation. It is important to note that the conclusions that lead to this procedure are derived in large part from the available data. Always take into account that the treatment will also be  dependent on availability of resources and existing treatment guidelines, considered by other Pain Management Practitioners as being common knowledge and practice, at the time of the intervention.  For Medico-Legal purposes, it is also important to point out that variation in procedural techniques and pharmacological choices are the acceptable norm. The indications, contraindications, technique, and results of the above procedure should only be interpreted and judged by a Board-Certified Interventional Pain Specialist with extensive familiarity and expertise in the same exact procedure and technique. Attempts at providing opinions without similar or greater experience and expertise than that of the treating physician will be considered as inappropriate and unethical, and shall result in a formal complaint to the state medical board and applicable specialty societies.  Instructions provided at this appointment: Patient Instructions  Pain Management Discharge Instructions  General Discharge Instructions :  If you need to reach your doctor call: Monday-Friday 8:00 am - 4:00 pm at [phone] or toll free [phone].  After clinic hours [phone] to have operator reach doctor.  Bring all of your medication bottles to all your appointments in the pain clinic.  To cancel or reschedule your appointment with Pain Management please remember to call 24 hours in advance to avoid a fee.  Refer to the educational materials which you have been given on: General Risks, I had my Procedure. Discharge Instructions, Post Sedation.  Post Procedure Instructions:  The drugs you were given will stay in your system until tomorrow, so for the next 24 hours you should not drive, make any legal decisions or drink any alcoholic beverages.  You may eat anything you prefer, but it is better to start with liquids then soups and crackers, and gradually work up to solid foods.  Please notify your doctor immediately if  you have any unusual bleeding, trouble breathing or pain that is not related to your normal pain.  Depending on the type of procedure that was done, some parts of your body may feel week and/or numb.  This usually clears up by tonight or the next day.  Walk with the use of an assistive device or accompanied by an adult for the 24 hours.  You may use ice on the affected area for the first 24 hours.  Put ice in a Ziploc bag and cover with a towel and place against area 15 minutes on 15 minutes off.  You may switch to heat after 24 hours.

## 2016-05-10 ENCOUNTER — Telehealth: Payer: Self-pay

## 2016-05-10 NOTE — Telephone Encounter (Signed)
Post procedure phone call.  Left message.  

## 2016-05-22 ENCOUNTER — Telehealth: Payer: Self-pay | Admitting: Family Medicine

## 2016-05-22 NOTE — Telephone Encounter (Signed)
Pt called back and stated that she had changed her in mind in regards to a depression medication that Dr. Caryl Bis had suggest. She would like to speak with you before you do anything. Please advise, thank you!  Pharmacy - [id number] - [place], [place] - [address]  Call pt @ [phone]

## 2016-05-22 NOTE — Telephone Encounter (Signed)
Spoke with patient and she wants to try the depression medication that you discussed at the last appointment.

## 2016-05-23 MED ORDER — BUPROPION HCL ER (SR) 150 MG PO TB12: ORAL_TABLET | ORAL | 1 refills | Status: DC

## 2016-05-23 NOTE — Telephone Encounter (Signed)
Called and spoke with patient regarding medication for depression. Given that she is on a significant dose of tramadol we will avoid SSRIs. We will try Wellbutrin. This was sent to her pharmacy. Will keep her follow-up in January.

## 2016-05-29 DIAGNOSIS — G894 Chronic pain syndrome: Secondary | ICD-10-CM | POA: Insufficient documentation

## 2016-06-04 ENCOUNTER — Telehealth: Payer: Self-pay | Admitting: Family Medicine

## 2016-06-04 NOTE — Telephone Encounter (Signed)
Patient advised of below and verbalized understanding.  

## 2016-06-04 NOTE — Telephone Encounter (Signed)
Patient should discontinue the medication. If her symptoms do not improve she needs to follow-up.

## 2016-06-04 NOTE — Telephone Encounter (Signed)
Patient states since starting medication bupropion  had diarrhea and can't eat not hungry.  She just doesn't  feel like herself.  She hasn't taken medication today.  She is states she is making mistakes .  Ok to leave messages.  Please advise.

## 2016-06-04 NOTE — Telephone Encounter (Signed)
The patient wanting to speak with Dr. Caryl Bis regarding a new medication (buPROPion (WELLBUTRIN SR) 150 MG 12 hr tablet. The patient states that it is making her feel weird as if she can't do anything right . The patient wants the medication discontinued.

## 2016-06-05 ENCOUNTER — Telehealth: Payer: Self-pay

## 2016-06-05 NOTE — Telephone Encounter (Signed)
Spoke with Amanda Soto and she states that her back is feeling better,  States that she had a really bad bout of diarrhea and afterwards her back felt better.  Denies fever or any other s/s.  Reviewed chart, patient has appt on December 18 for f/up after procedure.  Patient agrees that she will wait for this appt time and if s/s become more severe with the pain in her back she will call us back.

## 2016-06-05 NOTE — Telephone Encounter (Signed)
The patient called and left a voicemail saying she has been hurting a lot since last Thursday and wants someone to call her about it.

## 2016-06-06 ENCOUNTER — Telehealth: Payer: Self-pay | Admitting: Pain Medicine

## 2016-06-06 NOTE — Telephone Encounter (Signed)
Patient scheduled for Facet injection on 06-13-16 at 0830.  Pre procedure instructions given.  Instructed to stop eliquis 3 days prior to procedure.  Scheduled by Juliann Pulse.

## 2016-06-06 NOTE — Telephone Encounter (Signed)
Called patient.  Patient states that pain is in her low back and does not radiate.  Confirmed with Dr Dossie Arbour and he states to schedule her for Facet injection.  Patient needs to stop eliquis for 3 days prior to procedure.

## 2016-06-06 NOTE — Telephone Encounter (Signed)
Patient states she wants to go ahead with another procedure. Would like to get in before dr Dossie Arbour leaves for the holidays. Per Angie she does not need prior auth. Please let us know what to sched and how soon, patient had lumbar facet on 05-09-16

## 2016-06-13 ENCOUNTER — Ambulatory Visit
Admission: RE | Admit: 2016-06-13 | Discharge: 2016-06-13 | Disposition: A | Discharge status: Home / Self Care | Payer: Medicare Other | Source: Ambulatory Visit | Attending: Pain Medicine | Admitting: Pain Medicine

## 2016-06-13 ENCOUNTER — Encounter: Payer: Self-pay | Admitting: Pain Medicine

## 2016-06-13 ENCOUNTER — Ambulatory Visit: Payer: Medicare Other | Attending: Pain Medicine | Admitting: Pain Medicine

## 2016-06-13 VITALS — BP 153/70 | HR 41 | Temp 97.2°F | Resp 11 | Ht 59.5 in | Wt 112.0 lb

## 2016-06-13 DIAGNOSIS — M47816 Spondylosis without myelopathy or radiculopathy, lumbar region: Secondary | ICD-10-CM

## 2016-06-13 DIAGNOSIS — Z9889 Other specified postprocedural states: Secondary | ICD-10-CM | POA: Insufficient documentation

## 2016-06-13 DIAGNOSIS — M549 Dorsalgia, unspecified: Secondary | ICD-10-CM | POA: Insufficient documentation

## 2016-06-13 DIAGNOSIS — M1288 Other specific arthropathies, not elsewhere classified, other specified site: Secondary | ICD-10-CM

## 2016-06-13 DIAGNOSIS — Z90710 Acquired absence of both cervix and uterus: Secondary | ICD-10-CM | POA: Diagnosis not present

## 2016-06-13 DIAGNOSIS — M47896 Other spondylosis, lumbar region: Secondary | ICD-10-CM | POA: Diagnosis not present

## 2016-06-13 MED ORDER — LACTATED RINGERS IV SOLN: 1000.0000 mL | Freq: Once | INTRAVENOUS | Status: DC

## 2016-06-13 MED ORDER — FENTANYL CITRATE (PF) 100 MCG/2ML IJ SOLN
INTRAMUSCULAR | Status: AC
  Administered 2016-06-13: 25 ug via INTRAVENOUS
  Filled 2016-06-13: qty 2

## 2016-06-13 MED ORDER — ROPIVACAINE HCL 2 MG/ML IJ SOLN: 9.0000 mL | Freq: Once | INTRAMUSCULAR | Status: DC

## 2016-06-13 MED ORDER — LIDOCAINE HCL (PF) 1 % IJ SOLN: 10.0000 mL | Freq: Once | INTRAMUSCULAR | Status: DC

## 2016-06-13 MED ORDER — MIDAZOLAM HCL 5 MG/5ML IJ SOLN
1.0000 mg | INTRAMUSCULAR | Status: DC | PRN
  Administered 2016-06-13: 0.5 mg via INTRAVENOUS

## 2016-06-13 MED ORDER — FENTANYL CITRATE (PF) 100 MCG/2ML IJ SOLN
25.0000 ug | INTRAMUSCULAR | Status: DC | PRN
  Administered 2016-06-13: 25 ug via INTRAVENOUS

## 2016-06-13 MED ORDER — TRIAMCINOLONE ACETONIDE 40 MG/ML IJ SUSP
INTRAMUSCULAR | Status: AC
  Filled 2016-06-13: qty 2

## 2016-06-13 MED ORDER — MIDAZOLAM HCL 5 MG/5ML IJ SOLN
INTRAMUSCULAR | Status: AC
  Administered 2016-06-13: 0.5 mg via INTRAVENOUS
  Filled 2016-06-13: qty 5

## 2016-06-13 MED ORDER — TRIAMCINOLONE ACETONIDE 40 MG/ML IJ SUSP: 40.0000 mg | Freq: Once | INTRAMUSCULAR | Status: DC

## 2016-06-13 MED ORDER — ROPIVACAINE HCL 2 MG/ML IJ SOLN
INTRAMUSCULAR | Status: AC
  Filled 2016-06-13: qty 20

## 2016-06-13 NOTE — Patient Instructions (Addendum)
Pain Management Discharge Instructions  General Discharge Instructions :  If you need to reach your doctor call: Monday-Friday 8:00 am - 4:00 pm at [phone] or toll free [phone].  After clinic hours [phone] to have operator reach doctor.  Bring all of your medication bottles to all your appointments in the pain clinic.  To cancel or reschedule your appointment with Pain Management please remember to call 24 hours in advance to avoid a fee.  Refer to the educational materials which you have been given on: General Risks, I had my Procedure. Discharge Instructions, Post Sedation.  Post Procedure Instructions:  The drugs you were given will stay in your system until tomorrow, so for the next 24 hours you should not drive, make any legal decisions or drink any alcoholic beverages.  You may eat anything you prefer, but it is better to start with liquids then soups and crackers, and gradually work up to solid foods.  Please notify your doctor immediately if you have any unusual bleeding, trouble breathing or pain that is not related to your normal pain.  Depending on the type of procedure that was done, some parts of your body may feel week and/or numb.  This usually clears up by tonight or the next day.  Walk with the use of an assistive device or accompanied by an adult for the 24 hours.  You may use ice on the affected area for the first 24 hours.  Put ice in a Ziploc bag and cover with a towel and place against area 15 minutes on 15 minutes off.  You may switch to heat after 24 hours.Pain Management Discharge Instructions  General Discharge Instructions :  If you need to reach your doctor call: Monday-Friday 8:00 am - 4:00 pm at [phone] or toll free [phone].  After clinic hours [phone] to have operator reach doctor.  Bring all of your medication bottles to all your appointments in the pain clinic.  To cancel or reschedule your appointment with Pain  Management please remember to call 24 hours in advance to avoid a fee.  Refer to the educational materials which you have been given on: General Risks, I had my Procedure. Discharge Instructions, Post Sedation.  Post Procedure Instructions:  The drugs you were given will stay in your system until tomorrow, so for the next 24 hours you should not drive, make any legal decisions or drink any alcoholic beverages.  You may eat anything you prefer, but it is better to start with liquids then soups and crackers, and gradually work up to solid foods.  Please notify your doctor immediately if you have any unusual bleeding, trouble breathing or pain that is not related to your normal pain.  Depending on the type of procedure that was done, some parts of your body may feel week and/or numb.  This usually clears up by tonight or the next day.  Walk with the use of an assistive device or accompanied by an adult for the 24 hours.  You may use ice on the affected area for the first 24 hours.  Put ice in a Ziploc bag and cover with a towel and place against area 15 minutes on 15 minutes off.  You may switch to heat after 24 hours.

## 2016-06-13 NOTE — Progress Notes (Signed)
Safety precautions to be maintained throughout the outpatient stay will include: orient to surroundings, keep bed in low position, maintain call bell within reach at all times, provide assistance with transfer out of bed and ambulation.  

## 2016-06-13 NOTE — Progress Notes (Signed)
Patient's Name: Amanda Soto  MRN: [id number]  Referring Provider: [name], MD  DOB: 08-Aug-1924  PCP: Amanda Haven, MD  DOS: 06/13/2016  Note by: Amanda Brunswick. Dossie Arbour, MD  Service setting: Ambulatory outpatient  Location: [facility] (AMB) Pain Management Facility  Visit type: Procedure  Specialty: Interventional Pain Management  Patient type: Established   Primary Reason for Visit: Interventional Pain Management Treatment. CC: Back Pain (mid-lower)  Procedure:  Anesthesia, Analgesia, Anxiolysis:  Type: Palliative Medial Branch Facet Block Region: Lumbar Level: L2, L3, L4, L5, & S1 Medial Branch Level(s) Laterality: Right  Type: Local Anesthesia with Moderate (Conscious) Sedation Local Anesthetic: Lidocaine 1% Route: Intravenous (IV) IV Access: Secured Sedation: Meaningful verbal contact was maintained at all times during the procedure  Indication(s): Analgesia and Anxiety  Indications: 1. Lumbar facet syndrome (Location of Primary Source of Pain) (Right)   2. Lumbar spondylosis    Pain Score: Pre-procedure: 5  (Had to rad scale to pt)/10 Post-procedure: 0-No pain/10  Pre-Procedure Assessment:  Amanda Soto is a 82 y.o. (year old), female patient, seen today for interventional treatment. She  has a past surgical history that includes Cataract extraction; Partial hysterectomy; Appendectomy; and Tonsillectomy.. Her primarily concern today is the Back Pain (mid-lower) The primary encounter diagnosis was Lumbar facet syndrome (Location of Primary Source of Pain) (Right). A diagnosis of Lumbar spondylosis was also pertinent to this visit.  Pain Type: Chronic pain Pain Location: Back Pain Orientation: Mid, Lower, Right Pain Descriptors / Indicators: Aching, Discomfort, Sore, Tender Pain Frequency: Constant  Date of Last Visit: 05/09/16 Service Provided on Last Visit: Procedure  Coagulation Parameters Lab Results  Component Value Date   INR 1.3 02/05/2014   LABPROT 15.5 (H)  02/05/2014   APTT < 23.0 (L) 02/05/2014   PLT 328.0 12/21/2015   Verification of the correct person, correct site (including marking of site), and correct procedure were performed and confirmed by the patient.  Consent: Before the procedure and under the influence of no sedative(s), amnesic(s), or anxiolytics, the patient was informed of the treatment options, risks and possible complications. To fulfill our ethical and legal obligations, as recommended by the American Medical Association's Code of Ethics, I have informed the patient of my clinical impression; the nature and purpose of the treatment or procedure; the risks, benefits, and possible complications of the intervention; the alternatives, including doing nothing; the risk(s) and benefit(s) of the alternative treatment(s) or procedure(s); and the risk(s) and benefit(s) of doing nothing. The patient was provided information about the general risks and possible complications associated with the procedure. These may include, but are not limited to: failure to achieve desired goals, infection, bleeding, organ or nerve damage, allergic reactions, paralysis, and death. In addition, the patient was informed of those risks and complications associated to Spine-related procedures, such as failure to decrease pain; infection (i.e.: Meningitis, epidural or intraspinal abscess); bleeding (i.e.: epidural hematoma, subarachnoid hemorrhage, or any other type of intraspinal or peri-dural bleeding); organ or nerve damage (i.e.: Any type of peripheral nerve, nerve root, or spinal cord injury) with subsequent damage to sensory, motor, and/or autonomic systems, resulting in permanent pain, numbness, and/or weakness of one or several areas of the body; allergic reactions; (i.e.: anaphylactic reaction); and/or death. Furthermore, the patient was informed of those risks and complications associated with the medications. These include, but are not limited to: allergic  reactions (i.e.: anaphylactic or anaphylactoid reaction(s)); adrenal axis suppression; blood sugar elevation that in diabetics may result in ketoacidosis or comma;  water retention that in patients with history of congestive heart failure may result in shortness of breath, pulmonary edema, and decompensation with resultant heart failure; weight gain; swelling or edema; medication-induced neural toxicity; particulate matter embolism and blood vessel occlusion with resultant organ, and/or nervous system infarction; and/or aseptic necrosis of one or more joints. Finally, the patient was informed that Medicine is not an exact science; therefore, there is also the possibility of unforeseen or unpredictable risks and/or possible complications that may result in a catastrophic outcome. The patient indicated having understood very clearly. We have given the patient no guarantees and we have made no promises. Enough time was given to the patient to ask questions, all of which were answered to the patient's satisfaction. Amanda Soto has indicated that she wanted to continue with the procedure.  Consent Attestation: I, the ordering provider, attest that I have discussed with the patient the benefits, risks, side-effects, alternatives, likelihood of achieving goals, and potential problems during recovery for the procedure that I have provided informed consent.  Pre-Procedure Preparation:  Safety Precautions: Allergies reviewed. The patient was asked about blood thinners, or active infections, both of which were denied. The patient was asked to confirm the procedure and laterality, before marking the site, and again before commencing the procedure. Appropriate site, procedure, and patient were confirmed by following the Joint Commission's Universal Protocol (UP.01.01.01), in the form of a "Time Out". The patient was asked to participate by confirming the accuracy of the "Time Out" information. Patient was assessed for  positional comfort and pressure points before starting the procedure. Allergies: She is allergic to hydrocodone; ciprofloxacin; and latex. Allergy Precautions: Latex-free protocol activated Infection Control Precautions: Sterile technique used. Standard Universal Precautions were taken as recommended by the Department of [facility] for Disease Control and Prevention (CDC). Standard pre-surgical skin prep was conducted. Respiratory hygiene and cough etiquette was practiced. Hand hygiene observed. Safe injection practices and needle disposal techniques followed. SDV (single dose vial) medications used. Medications properly checked for expiration dates and contaminants. Personal protective equipment (PPE) used as per protocol. Monitoring:  As per clinic protocol. Vitals:   06/13/16 1042 06/13/16 1049 06/13/16 1059 06/13/16 1109  BP: 133/62 (!) 148/65 (!) 150/69 (!) 153/70  Pulse: (!) 43 (!) 42 (!) 41 (!) 41  Resp: 17 14 14 11   Temp:  97.4 F (36.3 C)  97.2 F (36.2 C)  TempSrc:  Temporal  Temporal  SpO2: 96% 95% 97% 98%  Weight:      Height:      Calculated BMI: Body mass index is 22.24 kg/m. Time-out: "Time-out" completed before starting procedure, as per protocol.  Imaging Review  Lumbosacral Imaging: Lumbar MR wo contrast:  Results for orders placed in visit on 10/24/12  MR L Spine [facility] W/O Cm   Narrative * PRIOR REPORT IMPORTED FROM AN EXTERNAL SYSTEM *   PRIOR REPORT IMPORTED FROM THE [facility] WORKFLOW SYSTEM   REASON FOR EXAM:    Lumbar spondylosis  COMMENTS:   PROCEDURE:     MR  - MR LUMBAR SPINE WO CONTRAST  - Oct 24 2012  1:11PM   RESULT:     Comparison is made to a prior study dated 12/19/2010.   Technique: Multiplanar and multisequence imaging of the lumbar spine was  obtained without the administration of gadolinium. The conus medullaris  terminates at a T12-L1 level. The cauda equina demonstrate no evidence of  clumping nor thickening.   At the T12-L1, L1-L2,  L2-L3 levels, there  is no evidence of significant  thecal sac stenosis nor neural foraminal narrowing.   At the L3-L4, L4-L5 and L5-S1 levels subligamentous disc bulges are  appreciated without evidence of thecal sac stenosis or neural foraminal  narrowing.   The osseous structures demonstrate no evidence of marrow edema.   IMPRESSION:   1. Multilevel mild degenerative disc disease changes without evidence of  thecal sac stenosis or neural foraminal narrowing. These findings are  unchanged when compared to previous study.   Thank you for the opportunity to contribute to the care of your patient.       Lumbar DG Bending views:  Results for orders placed during the hospital encounter of 11/14/15  DG Lumbar Spine Complete W/Bend   Narrative CLINICAL DATA:  Back pain.  EXAM: LUMBAR SPINE - COMPLETE WITH BENDING VIEWS  COMPARISON:  Bone scan 12/09/2014.  CT 11/02/2013.  FINDINGS: Degenerative changes lumbar spinal scoliosis concave right. 5 mm anterolisthesis L4 on L5. No acute bony abnormality . Aortoiliac atherosclerotic vascular disease. Renal vascular disease.  IMPRESSION: 1. Diffuse degenerative change lumbar spine with 5 mm anterolisthesis L4 on L5. Diffuse osteopenia. No acute bony abnormality.  2. Aortoiliac atherosclerotic vascular disease. Renal vascular disease .   Electronically Signed   By: Marcello Moores  Register   On: 11/15/2015 08:52    Description of Procedure Process:   Time-out: "Time-out" completed before starting procedure, as per protocol. Position: Prone Target Area: For Lumbar Facet blocks, the target is the groove formed by the junction of the transverse process and superior articular process. For the L5 dorsal ramus, the target is the notch between superior articular process and sacral ala. For the S1 dorsal ramus, the target is the superior and lateral edge of the posterior S1 Sacral foramen. Approach: Paramedial approach. Area Prepped: Entire  Posterior Lumbosacral Region Prepping solution: ChloraPrep (2% chlorhexidine gluconate and 70% isopropyl alcohol) Safety Precautions: Aspiration looking for blood return was conducted prior to all injections. At no point did we inject any substances, as a needle was being advanced. No attempts were made at seeking any paresthesias. Safe injection practices and needle disposal techniques used. Medications properly checked for expiration dates. SDV (single dose vial) medications used. Description of the Procedure: Protocol guidelines were followed. The patient was placed in position over the fluoroscopy table. The target area was identified and the area prepped in the usual manner. Skin desensitized using vapocoolant spray. Skin & deeper tissues infiltrated with local anesthetic. Appropriate amount of time allowed to pass for local anesthetics to take effect. The procedure needle was introduced through the skin, ipsilateral to the reported pain, and advanced to the target area. Employing the "Medial Branch Technique", the needles were advanced to the angle made by the superior and medial portion of the transverse process, and the lateral and inferior portion of the superior articulating process of the targeted vertebral bodies. This area is known as "Burton's Eye" or the "Eye of the [place] Dog". A procedure needle was introduced through the skin, and this time advanced to the angle made by the superior and medial border of the sacral ala, and the lateral border of the S1 vertebral body. This last needle was later repositioned at the superior and lateral border of the posterior S1 foramen. Negative aspiration confirmed. Solution injected in intermittent fashion, asking for systemic symptoms every 0.5cc of injectate. The needles were then removed and the area cleansed, making sure to leave some of the prepping solution back to take advantage of its long  term bactericidal properties. EBL: None Materials &  Medications Used:  Needle(s) Used: 22g - 3.5" Spinal Needle(s)   Illustration of the posterior view of the lumbar spine and the posterior neural structures. Laminae of L2 through S1 are labeled. DPRL5, dorsal primary ramus of L5; DPRS1, dorsal primary ramus of S1; DPR3, dorsal primary ramus of L3; FJ, facet (zygapophyseal) joint L3-L4; I, inferior articular process of L4; LB1, lateral branch of dorsal primary ramus of L1; IAB, inferior articular branches from L3 medial branch (supplies L4-L5 facet joint); IBP, intermediate branch plexus; MB3, medial branch of dorsal primary ramus of L3; NR3, third lumbar nerve root; S, superior articular process of L5; SAB, superior articular branches from L4 (supplies L4-5 facet joint also); TP3, transverse process of L3.  Imaging Guidance (Spinal):  Type of Imaging Technique: Fluoroscopy Guidance (Spinal) Indication(s): Assistance in needle guidance and placement for procedures requiring needle placement in or near specific anatomical locations not easily accessible without such assistance. Exposure Time: Please see nurses notes. Contrast: None used. Fluoroscopic Guidance: I was personally present during the use of fluoroscopy. "Tunnel Vision Technique" used to obtain the best possible view of the target area. Parallax error corrected before commencing the procedure. "Direction-depth-direction" technique used to introduce the needle under continuous pulsed fluoroscopy. Once target was reached, antero-posterior, oblique, and lateral fluoroscopic projection used confirm needle placement in all planes. Images permanently stored in EMR. Interpretation: No contrast injected. I personally interpreted the imaging intraoperatively. Adequate needle placement confirmed in multiple planes. Permanent images saved into the patient's record.  Antibiotic Prophylaxis:  Indication(s): No indications identified. Type:  Antibiotics Given (last 72 hours)    None        Post-operative Assessment:  Complications: No immediate post-treatment complications observed by team, or reported by patient. Disposition: The patient tolerated the entire procedure well. A repeat set of vitals were taken after the procedure and the patient was kept under observation following institutional policy, for this type of procedure. Post-procedural neurological assessment was performed, showing return to baseline, prior to discharge. The patient was provided with post-procedure discharge instructions, including a section on how to identify potential problems. Should any problems arise concerning this procedure, the patient was given instructions to immediately contact us, at any time, without hesitation. In any case, we plan to contact the patient by telephone for a follow-up status report regarding this interventional procedure. Comments:  No additional relevant information.  Plan of Care  Discharge to: Discharge home  Medications ordered for procedure: Meds ordered this encounter  Medications  . fentaNYL (SUBLIMAZE) injection 25-50 mcg    Make sure Narcan is available in the pyxis when using this medication. In the event of respiratory depression (RR< 8/min): Titrate NARCAN (naloxone) in increments of 0.1 to 0.2 mg IV at 2-3 minute intervals, until desired degree of reversal.  . lactated ringers infusion 1,000 mL  . midazolam (VERSED) 5 MG/5ML injection 1-2 mg    Make sure Flumazenil is available in the pyxis when using this medication. If oversedation occurs, administer 0.2 mg IV over 15 sec. If after 45 sec no response, administer 0.2 mg again over 1 min; may repeat at 1 min intervals; not to exceed 4 doses (1 mg)  . triamcinolone acetonide (KENALOG-40) injection 40 mg  . lidocaine (PF) (XYLOCAINE) 1 % injection 10 mL  . ropivacaine (PF) 2 mg/mL (0.2%) (NAROPIN) injection 9 mL  . ropivacaine (PF) 2 mg/mL (0.2%) (NAROPIN) 2 MG/ML injection    [place]ne [place], Patti: cabinet override  .  triamcinolone acetonide (KENALOG-40) 40 MG/ML injection    [place], Patti: cabinet override  . fentaNYL (SUBLIMAZE) 100 MCG/2ML injection    [place]ne [place], Patti: cabinet override  . midazolam (VERSED) 5 MG/5ML injection    [place]ne [place], Patti: cabinet override   Medications administered: (For more details, see medical record) We administered fentaNYL, midazolam, fentaNYL, and midazolam. Lab-work, Procedure(s), & Referral(s) Ordered: Orders Placed This Encounter  Procedures  . LUMBAR FACET(MEDIAL BRANCH NERVE BLOCK) MBNB  . DG C-Arm 1-60 Min-No Report   Imaging Ordered: Results for orders placed in visit on 05/09/16  DG C-Arm 1-60 Min-No Report   Narrative Fluoroscopy was utilized by the requesting physician.  No radiographic  interpretation.    New Prescriptions   No medications on file   Physician-requested Follow-up:  Return in about 2 weeks (around 06/27/2016) for Post-Procedure evaluation.  Future Appointments Date Time Provider [place]  07/25/2016 2:30 PM Amanda Haven, MD [facility] None  08/01/2016 1:30 PM Milinda Pointer, MD [facility] None   Primary Care Physician: Amanda Haven, MD Location: [facility] Note by: Amanda Brunswick. Dossie Soto, M.D, DABA, DABAPM, DABPM, DABIPP, FIPP Date: 06/13/16; Time: 3:40 PM  Disclaimer:  Medicine is not an exact science. The only guarantee in medicine is that nothing is guaranteed. It is important to note that the decision to proceed with this intervention was based on the information collected from the patient. The Data and conclusions were drawn from the patient's questionnaire, the interview, and the physical examination. Because the information was provided in large part by the patient, it cannot be guaranteed that it has not been purposely or unconsciously manipulated. Every effort has been made to obtain as much relevant data as possible for this evaluation. It is important to note that the conclusions  that lead to this procedure are derived in large part from the available data. Always take into account that the treatment will also be dependent on availability of resources and existing treatment guidelines, considered by other Pain Management Practitioners as being common knowledge and practice, at the time of the intervention. For Medico-Legal purposes, it is also important to point out that variation in procedural techniques and pharmacological choices are the acceptable norm. The indications, contraindications, technique, and results of the above procedure should only be interpreted and judged by a Board-Certified Interventional Pain Specialist with extensive familiarity and expertise in the same exact procedure and technique. Attempts at providing opinions without similar or greater experience and expertise than that of the treating physician will be considered as inappropriate and unethical, and shall result in a formal complaint to the state medical board and applicable specialty societies.  Instructions provided at this appointment: Patient Instructions  Pain Management Discharge Instructions  General Discharge Instructions :  If you need to reach your doctor call: Monday-Friday 8:00 am - 4:00 pm at [phone] or toll free [phone].  After clinic hours [phone] to have operator reach doctor.  Bring all of your medication bottles to all your appointments in the pain clinic.  To cancel or reschedule your appointment with Pain Management please remember to call 24 hours in advance to avoid a fee.  Refer to the educational materials which you have been given on: General Risks, I had my Procedure. Discharge Instructions, Post Sedation.  Post Procedure Instructions:  The drugs you were given will stay in your system until tomorrow, so for the next 24 hours you should not drive, make any legal decisions or drink any alcoholic beverages.  You  may eat anything you prefer, but it is  better to start with liquids then soups and crackers, and gradually work up to solid foods.  Please notify your doctor immediately if you have any unusual bleeding, trouble breathing or pain that is not related to your normal pain.  Depending on the type of procedure that was done, some parts of your body may feel week and/or numb.  This usually clears up by tonight or the next day.  Walk with the use of an assistive device or accompanied by an adult for the 24 hours.  You may use ice on the affected area for the first 24 hours.  Put ice in a Ziploc bag and cover with a towel and place against area 15 minutes on 15 minutes off.  You may switch to heat after 24 hours.Pain Management Discharge Instructions  General Discharge Instructions :  If you need to reach your doctor call: Monday-Friday 8:00 am - 4:00 pm at [phone] or toll free [phone].  After clinic hours [phone] to have operator reach doctor.  Bring all of your medication bottles to all your appointments in the pain clinic.  To cancel or reschedule your appointment with Pain Management please remember to call 24 hours in advance to avoid a fee.  Refer to the educational materials which you have been given on: General Risks, I had my Procedure. Discharge Instructions, Post Sedation.  Post Procedure Instructions:  The drugs you were given will stay in your system until tomorrow, so for the next 24 hours you should not drive, make any legal decisions or drink any alcoholic beverages.  You may eat anything you prefer, but it is better to start with liquids then soups and crackers, and gradually work up to solid foods.  Please notify your doctor immediately if you have any unusual bleeding, trouble breathing or pain that is not related to your normal pain.  Depending on the type of procedure that was done, some parts of your body may feel week and/or numb.  This usually clears up by tonight or the next day.  Walk with  the use of an assistive device or accompanied by an adult for the 24 hours.  You may use ice on the affected area for the first 24 hours.  Put ice in a Ziploc bag and cover with a towel and place against area 15 minutes on 15 minutes off.  You may switch to heat after 24 hours.

## 2016-06-14 ENCOUNTER — Telehealth: Payer: Self-pay | Admitting: *Deleted

## 2016-06-14 NOTE — Telephone Encounter (Signed)
Left voicemail for patient to call for any problems post procedure. 

## 2016-06-18 ENCOUNTER — Ambulatory Visit: Payer: Medicare Other | Admitting: Pain Medicine

## 2016-07-04 ENCOUNTER — Other Ambulatory Visit: Payer: Self-pay | Admitting: Family Medicine

## 2016-07-04 NOTE — Telephone Encounter (Signed)
faxed

## 2016-07-04 NOTE — Telephone Encounter (Signed)
Last filled 04/25/16 30 1rf

## 2016-07-04 NOTE — Telephone Encounter (Signed)
Please faxed to pharmacy.

## 2016-07-15 ENCOUNTER — Other Ambulatory Visit: Payer: Self-pay | Admitting: Family Medicine

## 2016-07-16 NOTE — Telephone Encounter (Signed)
Last filled 05/08/16 30 0rf

## 2016-07-16 NOTE — Telephone Encounter (Signed)
Please determine if she's been taking this medication and how often and for what reason. Thanks.

## 2016-07-17 NOTE — Telephone Encounter (Signed)
Patient states she takes this once a day and she takes it for fluid but she doesn't think she has any fuid

## 2016-07-17 NOTE — Telephone Encounter (Signed)
Patient also states she does not need a refill at this time

## 2016-07-18 NOTE — Telephone Encounter (Signed)
Noted. We will refuse to refill then.

## 2016-07-25 ENCOUNTER — Ambulatory Visit (INDEPENDENT_AMBULATORY_CARE_PROVIDER_SITE_OTHER): Payer: Medicare Other | Admitting: Family Medicine

## 2016-07-25 ENCOUNTER — Encounter: Payer: Self-pay | Admitting: Family Medicine

## 2016-07-25 VITALS — BP 148/70 | HR 59 | Temp 98.0°F | Wt 119.2 lb

## 2016-07-25 DIAGNOSIS — R5382 Chronic fatigue, unspecified: Secondary | ICD-10-CM | POA: Insufficient documentation

## 2016-07-25 DIAGNOSIS — R5383 Other fatigue: Secondary | ICD-10-CM

## 2016-07-25 DIAGNOSIS — I10 Essential (primary) hypertension: Secondary | ICD-10-CM

## 2016-07-25 DIAGNOSIS — F329 Major depressive disorder, single episode, unspecified: Secondary | ICD-10-CM

## 2016-07-25 DIAGNOSIS — Z0000 Encounter for general adult medical examination without abnormal findings: Secondary | ICD-10-CM | POA: Diagnosis not present

## 2016-07-25 DIAGNOSIS — E039 Hypothyroidism, unspecified: Secondary | ICD-10-CM | POA: Diagnosis not present

## 2016-07-25 DIAGNOSIS — Z13820 Encounter for screening for osteoporosis: Secondary | ICD-10-CM

## 2016-07-25 LAB — COMPREHENSIVE METABOLIC PANEL
ALT: 17 U/L (ref 0–35)
AST: 22 U/L (ref 0–37)
Albumin: 3.9 g/dL (ref 3.5–5.2)
Alkaline Phosphatase: 46 U/L (ref 39–117)
BUN: 18 mg/dL (ref 6–23)
CALCIUM: 9.1 mg/dL (ref 8.4–10.5)
CHLORIDE: 102 meq/L (ref 96–112)
CO2: 30 meq/L (ref 19–32)
CREATININE: 0.52 mg/dL (ref 0.40–1.20)
GFR: 117.89 mL/min (ref >60.00)
Glucose, Bld: 107 mg/dL — ABNORMAL HIGH (ref 70–99)
POTASSIUM: 3.7 meq/L (ref 3.5–5.1)
Sodium: 138 mEq/L (ref 135–145)
Total Bilirubin: 0.6 mg/dL (ref 0.2–1.2)
Total Protein: 6.9 g/dL (ref 6.0–8.3)

## 2016-07-25 LAB — CBC
HEMATOCRIT: 39.7 % (ref 36.0–46.0)
HEMOGLOBIN: 13.1 g/dL (ref 12.0–15.0)
MCHC: 33.1 g/dL (ref 30.0–36.0)
MCV: 96.8 fl (ref 78.0–100.0)
PLATELETS: 207 10*3/uL (ref 150.0–400.0)
RBC: 4.1 Mil/uL (ref 3.87–5.11)
RDW: 15.5 % (ref 11.5–15.5)
WBC: 9.3 10*3/uL (ref 4.0–10.5)

## 2016-07-25 LAB — SEDIMENTATION RATE: SED RATE: 18 mm/h (ref 0–30)

## 2016-07-25 LAB — TSH: TSH: 0.14 u[IU]/mL — AB (ref 0.35–4.50)

## 2016-07-25 MED ORDER — ATORVASTATIN CALCIUM 10 MG PO TABS: 10.0000 mg | ORAL_TABLET | Freq: Every day | ORAL | 2 refills | Status: DC

## 2016-07-25 NOTE — Progress Notes (Signed)
[name], MD Phone: [phone]  Amanda Soto is a 83 y.o. female who presents today for follow-up.  The patient reports progressively having a lack of energy over the last 2 years. Notes this started after being switched from Lipitor to generic atorvastatin. She just feels as though she has no energy. She notes no myalgias. No bleeding. No chest pain. No shortness of breath. No weight loss. She does have some sweats occasionally around her waist and back though no drenching sweats. They're not specifically night sweats. She does not snore. No apneic episodes. She does wake up well rested.  Hypothyroidism: Taking Synthroid. No weight changes. Some cold intolerance. No heat intolerance. No skin changes.  Depression: She notes this is stable. Not any more depressed than usual. No SI or HI. Not taking any medication for this.  Elevated blood pressure: Not checking at home. No prior history of hypertension. No medication for this. No chest pain or trouble breathing.  PMH: nonsmoker.   ROS see history of present illness  Objective  Physical Exam Vitals:   07/25/16 1429 07/25/16 1456  BP: (!) 158/74 (!) 148/70  Pulse: (!) 59   Temp: 98 F (36.7 C)     BP Readings from Last 3 Encounters:  07/25/16 (!) 148/70  06/13/16 (!) 153/70  05/09/16 137/63   Wt Readings from Last 3 Encounters:  07/25/16 119 lb 3.2 oz (54.1 kg)  06/13/16 112 lb (50.8 kg)  05/09/16 117 lb (53.1 kg)    Physical Exam  Constitutional: No distress.  HENT:  Head: Normocephalic and atraumatic.  Cardiovascular: Normal rate, regular rhythm and normal heart sounds.   Pulmonary/Chest: Effort normal and breath sounds normal.  Musculoskeletal: She exhibits no edema.  Lymphadenopathy:       Head (right side): No submental, no submandibular and no posterior auricular adenopathy present.       Head (left side): No submental, no submandibular and no posterior auricular adenopathy present.    She has no  cervical adenopathy.  Neurological: She is alert.  5 out of 5 strength bilateral quads, hamstrings, plantar flexion, and dorsiflexion, sensation light touch intact bilateral lower extremities  Skin: Skin is warm and dry. She is not diaphoretic.     Assessment/Plan: Please see individual problem list.  Hypothyroidism Continue Synthroid. Check TSH.  Tiredness Patient notes progressive decreased energy over the last 2 years. She blames this on the change from Lipitor to generic atorvastatin. She does have hypothyroidism and has been anemic previously. She notes no sleep apnea symptoms. She does get sweats though they are not night sweats and she has not had any weight loss. They are not drenching sweats. Her exam is benign. At this point I'm unsure of the specific cause of her decreased energy. We will check lab work as outlined below. We will try switching her to brand name Lipitor to see if this will make a difference. Once labs return and we will determine the next step in management.  Depression Stable and unchanged. She'll continue to monitor.  HTN (hypertension) Slightly elevated today. Had been fairly well controlled previously. Discussed having them monitor her blood pressure at home and as long as that is consistently less than 150/90 this will be adequate. They'll contact us in 2-3 weeks and inform us what her blood pressure has been running. Once we hear back from them we will determine what the next step is with regards to her blood pressure.   Orders Placed This Encounter  Procedures  .  DG Bone Density    Standing Status:   Future    Standing Expiration Date:   09/22/2017    Order Specific Question:   Reason for Exam (SYMPTOM  OR DIAGNOSIS REQUIRED)    Answer:   osteoporosis screening    Order Specific Question:   Preferred imaging location?    Answer:   [facility]  . Comp Met (CMET)  . CBC  . TSH  . Sed Rate (ESR)    Meds ordered this encounter  Medications  .  atorvastatin (LIPITOR) 10 MG tablet    Sig: Take 1 tablet (10 mg total) by mouth daily.    Dispense:  90 tablet    Refill:  2    Dispense brand name    [name], MD [place]

## 2016-07-25 NOTE — Progress Notes (Signed)
Pre visit review using our clinic review tool, if applicable. No additional management support is needed unless otherwise documented below in the visit note. 

## 2016-07-25 NOTE — Assessment & Plan Note (Signed)
Slightly elevated today. Had been fairly well controlled previously. Discussed having them monitor her blood pressure at home and as long as that is consistently less than 150/90 this will be adequate. They'll contact us in 2-3 weeks and inform us what her blood pressure has been running. Once we hear back from them we will determine what the next step is with regards to her blood pressure.

## 2016-07-25 NOTE — Assessment & Plan Note (Signed)
Stable and unchanged. She'll continue to monitor.

## 2016-07-25 NOTE — Assessment & Plan Note (Signed)
Continue Synthroid.  Check TSH. 

## 2016-07-25 NOTE — Patient Instructions (Addendum)
Nice to see you. We sent in the brand name Lipitor. We'll check some lab work today. Please monitor your weakness and see if this improves with the Lipitor. Please start monitoring your blood pressure at home. If it is consistently greater than 150/90 please let us know.    Thank you for taking time to come for your Medicare Wellness Visit. I appreciate your ongoing commitment to your health goals. Please review the following plan we discussed and let me know if I can assist you in the future.   These are the goals we discussed: Goals    . Increase physical activity          Chair exercises as demonstrated. Educational material provided.       This is a list of the screening recommended for you and due dates:  Health Maintenance  Topic Date Due  . Tetanus Vaccine  02/10/1944  . Shingles Vaccine  02/11/1987  . DEXA scan (bone density measurement)  02/11/1992  . Pneumonia vaccines (1 of 2 - PCV13) 02/11/1992  . Flu Shot  Completed    Fall Prevention in the Home Introduction Falls can cause injuries. They can happen to people of all ages. There are many things you can do to make your home safe and to help prevent falls. What can I do on the outside of my home?  Regularly fix the edges of walkways and driveways and fix any cracks.  Remove anything that might make you trip as you walk through a door, such as a raised step or threshold.  Trim any bushes or trees on the path to your home.  Use bright outdoor lighting.  Clear any walking paths of anything that might make someone trip, such as rocks or tools.  Regularly check to see if handrails are loose or broken. Make sure that both sides of any steps have handrails.  Any raised decks and porches should have guardrails on the edges.  Have any leaves, snow, or ice cleared regularly.  Use sand or salt on walking paths during winter.  Clean up any spills in your garage right away. This includes oil or grease spills. What  can I do in the bathroom?  Use night lights.  Install grab bars by the toilet and in the tub and shower. Do not use towel bars as grab bars.  Use non-skid mats or decals in the tub or shower.  If you need to sit down in the shower, use a plastic, non-slip stool.  Keep the floor dry. Clean up any water that spills on the floor as soon as it happens.  Remove soap buildup in the tub or shower regularly.  Attach bath mats securely with double-sided non-slip rug tape.  Do not have throw rugs and other things on the floor that can make you trip. What can I do in the bedroom?  Use night lights.  Make sure that you have a light by your bed that is easy to reach.  Do not use any sheets or blankets that are too big for your bed. They should not hang down onto the floor.  Have a firm chair that has side arms. You can use this for support while you get dressed.  Do not have throw rugs and other things on the floor that can make you trip. What can I do in the kitchen?  Clean up any spills right away.  Avoid walking on wet floors.  Keep items that you use a lot in  easy-to-reach places.  If you need to reach something above you, use a strong step stool that has a grab bar.  Keep electrical cords out of the way.  Do not use floor polish or wax that makes floors slippery. If you must use wax, use non-skid floor wax.  Do not have throw rugs and other things on the floor that can make you trip. What can I do with my stairs?  Do not leave any items on the stairs.  Make sure that there are handrails on both sides of the stairs and use them. Fix handrails that are broken or loose. Make sure that handrails are as long as the stairways.  Check any carpeting to make sure that it is firmly attached to the stairs. Fix any carpet that is loose or worn.  Avoid having throw rugs at the top or bottom of the stairs. If you do have throw rugs, attach them to the floor with carpet tape.  Make sure  that you have a light switch at the top of the stairs and the bottom of the stairs. If you do not have them, ask someone to add them for you. What else can I do to help prevent falls?  Wear shoes that:  Do not have high heels.  Have rubber bottoms.  Are comfortable and fit you well.  Are closed at the toe. Do not wear sandals.  If you use a stepladder:  Make sure that it is fully opened. Do not climb a closed stepladder.  Make sure that both sides of the stepladder are locked into place.  Ask someone to hold it for you, if possible.  Clearly mark and make sure that you can see:  Any grab bars or handrails.  First and last steps.  Where the edge of each step is.  Use tools that help you move around (mobility aids) if they are needed. These include:  Canes.  Walkers.  Scooters.  Crutches.  Turn on the lights when you go into a dark area. Replace any light bulbs as soon as they burn out.  Set up your furniture so you have a clear path. Avoid moving your furniture around.  If any of your floors are uneven, fix them.  If there are any pets around you, be aware of where they are.  Review your medicines with your doctor. Some medicines can make you feel dizzy. This can increase your chance of falling. Ask your doctor what other things that you can do to help prevent falls. This information is not intended to replace advice given to you by your health care provider. Make sure you discuss any questions you have with your health care provider. Document Released: 04/14/2009 Document Revised: 11/24/2015 Document Reviewed: 07/23/2014  2017 Elsevier

## 2016-07-25 NOTE — Assessment & Plan Note (Signed)
Patient notes progressive decreased energy over the last 2 years. She blames this on the change from Lipitor to generic atorvastatin. She does have hypothyroidism and has been anemic previously. She notes no sleep apnea symptoms. She does get sweats though they are not night sweats and she has not had any weight loss. They are not drenching sweats. Her exam is benign. At this point I'm unsure of the specific cause of her decreased energy. We will check lab work as outlined below. We will try switching her to brand name Lipitor to see if this will make a difference. Once labs return and we will determine the next step in management.

## 2016-07-25 NOTE — Progress Notes (Signed)
Subjective:   Amanda Soto is a 83 y.o. female who presents for an Initial Medicare Annual Wellness Visit.  Review of Systems    No ROS.  Medicare Wellness Visit.  Cardiac Risk Factors include: advanced age (>9men, >59 women);hypertension     Objective:    Today's Vitals   07/25/16 1429 07/25/16 1456  BP: (!) 158/74 (!) 148/70  Pulse: (!) 59   Temp: 98 F (36.7 C)   TempSrc: Oral   SpO2: 96%   Weight: 119 lb 3.2 oz (54.1 kg)    Body mass index is 23.67 kg/m.   Current Medications (verified) Outpatient Encounter Prescriptions as of 07/25/2016  Medication Sig  . acetaminophen (TYLENOL) 325 MG tablet Take 650 mg by mouth every 6 (six) hours as needed for moderate pain, fever or headache.  Marland Kitchen apixaban (ELIQUIS) 2.5 MG TABS tablet Take 2.5 mg by mouth 2 (two) times daily.  Marland Kitchen atorvastatin (LIPITOR) 10 MG tablet Take 1 tablet (10 mg total) by mouth daily.  Marland Kitchen buPROPion (WELLBUTRIN SR) 150 MG 12 hr tablet Take 150 mg (one tablet) by mouth daily for three days, then increase to 150 mg (one tablet) twice daily by mouth  . Difluprednate (DUREZOL) 0.05 % EMUL Frequency:PHARMDIR   Dosage:0.0     Instructions:  Note:One drop four times daily in the operative eye. Dose: 0.05 %  . docusate sodium (COLACE) 100 MG capsule Take by mouth 2 (two) times daily.   . furosemide (LASIX) 20 MG tablet TAKE 1 TABLET (20 MG TOTAL) BY MOUTH ONCE DAILY.  . iron polysaccharides (NIFEREX) 150 MG capsule Take 1 capsule (150 mg total) by mouth daily.  Marland Kitchen levothyroxine (SYNTHROID, LEVOTHROID) 100 MCG tablet Take by mouth.  . Multiple Vitamin (MULTIVITAMIN) capsule Take by mouth.  Marland Kitchen omeprazole (PRILOSEC) 20 MG capsule Take 20 mg by mouth daily.  . sotalol (BETAPACE) 80 MG tablet Take 40 mg by mouth 2 (two) times daily.   . traMADol (ULTRAM-ER) 300 MG 24 hr tablet TAKE 1 TABLET BY MOUTH ONCE A DAY  . [DISCONTINUED] atorvastatin (LIPITOR) 10 MG tablet Take 10 mg by mouth daily.   Facility-Administered Encounter  Medications as of 07/25/2016  Medication  . fentaNYL (SUBLIMAZE) injection 25-50 mcg  . fentaNYL (SUBLIMAZE) injection 25-50 mcg  . lactated ringers infusion 1,000 mL  . lactated ringers infusion 1,000 mL  . lidocaine (PF) (XYLOCAINE) 1 % injection 10 mL  . lidocaine (PF) (XYLOCAINE) 1 % injection 10 mL  . midazolam (VERSED) 5 MG/5ML injection 1-2 mg  . midazolam (VERSED) 5 MG/5ML injection 1-2 mg  . ropivacaine (PF) 2 mg/ml (0.2%) (NAROPIN) epidural 9 mL  . ropivacaine (PF) 2 mg/mL (0.2%) (NAROPIN) injection 9 mL  . triamcinolone acetonide (KENALOG-40) injection 40 mg  . triamcinolone acetonide (KENALOG-40) injection 40 mg    Allergies (verified) Hydrocodone; Ciprofloxacin; and Latex   History: Past Medical History:  Diagnosis Date  . Arthritis   . Atrial fibrillation ([place])   . CHF (congestive heart failure) ([place])   . Chickenpox   . Chronic abdominal pain 01/16/2014  . Degenerative arthritis of hip 08/19/2014  . Degenerative arthritis of lumbar spine 11/18/2013  . Depression   . Diverticulitis   . GERD (gastroesophageal reflux disease)   . Heart murmur   . Hyperlipidemia   . Hypertension   . Hypothyroid   . Neuritis or radiculitis due to rupture of lumbar intervertebral disc 11/18/2013  . Skin cancer   . Trochanteric bursitis of right hip 11/18/2013  Past Surgical History:  Procedure Laterality Date  . APPENDECTOMY    . CATARACT EXTRACTION    . PARTIAL HYSTERECTOMY    . TONSILLECTOMY     Family History  Problem Relation Age of Onset  . Stroke Maternal Grandmother   . Breast cancer Mother   . Breast cancer Sister   . Heart disease      Parent, grandparent, sons  . Hypertension      Parent, grandparent   Social History   Occupational History  . Not on file.   Social History Main Topics  . Smoking status: Never Smoker  . Smokeless tobacco: Never Used  . Alcohol use No  . Drug use: No  . Sexual activity: No    Tobacco Counseling Counseling given: Not  Answered   Activities of Daily Living In your present state of health, do you have any difficulty performing the following activities: 07/25/2016 11/09/2015  Hearing? N N  Vision? Y Y  Difficulty concentrating or making decisions? N N  Walking or climbing stairs? Y Y  Dressing or bathing? N N  Doing errands, shopping? Amanda Soto  Preparing Food and eating ? N -  Using the Toilet? N -  In the past six months, have you accidently leaked urine? Y -  Do you have problems with loss of bowel control? N -  Managing your Medications? N -  Managing your Finances? Y -  Housekeeping or managing your Housekeeping? Y -  Some recent data might be hidden    Immunizations and Health Maintenance Immunization History  Administered Date(s) Administered  . Influenza, High Dose Seasonal PF 04/25/2016   Health Maintenance Due  Topic Date Due  . TETANUS/TDAP  02/10/1944  . ZOSTAVAX  02/11/1987  . DEXA SCAN  02/11/1992  . PNA vac Low Risk Adult (1 of 2 - PCV13) 02/11/1992    Patient Care Team: Amanda Haven, MD as PCP - General (Family Medicine) Amanda Kida, MD as Consulting Physician (Cardiology)  Indicate any recent Medical Services you may have received from other than [facility] providers in the past year (date may be approximate).     Assessment:   This is a routine wellness examination for Amanda Soto. The goal of the wellness visit is to assist the patient how to close the gaps in care and create a preventative care plan for the patient.   Osteoporosis reviewed.  Dexa Scan ordered via PCP; follow as directed.  Medications reviewed; taking without issues or barriers.  Safety issues reviewed; smoke detectors in the home. No firearms in the home. Wears seatbelts when riding with others. No violence in the home.  No identified risk were noted; The patient was oriented x 3; appropriate in dress and manner and no objective failures at ADL's or IADL's. Uses a walker when ambulating.  BMI;  discussed the importance of a healthy diet, water intake and exercise. Educational material provided.  HTN; followed by PCP.  Pneumovax/Prevnar vaccine discussed.  Educational material provided.  Patient Concerns: None at this time. Follow up with PCP as needed.  Hearing/Vision screen Hearing Screening Comments: Patient passes the whisper test Vision Screening Comments: Followed by [facility] 01/2016 Macular Degeneration She has regular follow up with her ophthalmologist Semi-annual visits  Dietary issues and exercise activities discussed: Current Exercise Habits: The patient does not participate in regular exercise at present  Goals    . Increase physical activity          Chair exercises  as demonstrated. Educational material provided.      Depression Screen PHQ 2/9 Scores 07/25/2016 06/13/2016 05/09/2016 05/01/2016 01/25/2016 12/22/2015 12/21/2015  PHQ - 2 Score 1 0 0 0 4 4 6   PHQ- 9 Score 3 - - - 10 10 12   Exception Documentation - - - - - Medical reason -    Fall Risk Fall Risk  07/25/2016 07/25/2016 06/13/2016 05/09/2016 05/01/2016  Falls in the past year? No No No No No    Cognitive Function:     6CIT Screen 07/25/2016  What Year? 0 points  What month? 0 points  What time? 0 points  Count back from 20 0 points  Months in reverse 0 points    Screening Tests Health Maintenance  Topic Date Due  . TETANUS/TDAP  02/10/1944  . ZOSTAVAX  02/11/1987  . DEXA SCAN  02/11/1992  . PNA vac Low Risk Adult (1 of 2 - PCV13) 02/11/1992  . INFLUENZA VACCINE  Completed      Plan:    End of life planning; Advance aging; Advanced directives discussed. Copy of current HCPOA/Living Will requested.  Medicare Attestation I have personally reviewed: The patient's medical and social history Their use of alcohol, tobacco or illicit drugs Their current medications and supplements The patient's functional ability including ADLs,fall risks, home safety risks,  cognitive, and hearing and visual impairment Diet and physical activities Evidence for depression   The patient's weight, height, BMI, and visual acuity have been recorded in the chart.  I have made referrals and provided education to the patient based on review of the above and I have provided the patient with a written personalized care plan for preventive services.    During the course of the visit, Charnell was educated and counseled about the following appropriate screening and preventive services:   Vaccines to include Pneumoccal, Influenza, Hepatitis B, Td, Zostavax, HCV  Electrocardiogram  Cardiovascular disease screening  Colorectal cancer screening  Bone density screening  Diabetes screening  Glaucoma screening  Mammography/PAP  Nutrition counseling  Smoking cessation counseling  Patient Instructions (the written plan) were given to the patient.    Varney Biles, LPN   [id number]

## 2016-07-27 ENCOUNTER — Other Ambulatory Visit: Payer: Self-pay | Admitting: Family Medicine

## 2016-07-27 ENCOUNTER — Telehealth: Payer: Self-pay | Admitting: Family Medicine

## 2016-07-27 MED ORDER — ATORVASTATIN CALCIUM 10 MG PO TABS: 10.0000 mg | ORAL_TABLET | Freq: Every day | ORAL | 2 refills | Status: DC

## 2016-07-27 MED ORDER — LEVOTHYROXINE SODIUM 88 MCG PO TABS: 88.0000 ug | ORAL_TABLET | Freq: Every day | ORAL | 1 refills | Status: DC

## 2016-07-27 NOTE — Telephone Encounter (Signed)
Generic sent to pharmacy

## 2016-07-27 NOTE — Telephone Encounter (Signed)
Alternative Request - atorvastatin (LIPITOR) 10 MG tablet. Brand is not covered. Please fax generic.

## 2016-07-27 NOTE — Telephone Encounter (Signed)
noted 

## 2016-07-27 NOTE — Telephone Encounter (Signed)
Please advise 

## 2016-07-30 ENCOUNTER — Telehealth: Payer: Self-pay | Admitting: Family Medicine

## 2016-07-30 NOTE — Telephone Encounter (Signed)
Levothyroxine 88 was sent to pharmacy 07/27/16 was this supposed to be 65mcg?

## 2016-07-30 NOTE — Telephone Encounter (Signed)
Pt called needing some clarification on levothyroxine (SYNTHROID, LEVOTHROID) 88 MCG tablet. She thought that Dr. Caryl Bis was going to give her 50 mcg. Please advise, thank you!  Call pt @ [phone]

## 2016-07-30 NOTE — Telephone Encounter (Signed)
We advised that we would decrease the dose. It appeared in our system that she was taking 100 g of this. Please find out if she was taking 100 g or if she was taking a different dose. Thanks.

## 2016-07-31 MED ORDER — LEVOTHYROXINE SODIUM 50 MCG PO TABS: 50.0000 ug | ORAL_TABLET | Freq: Every day | ORAL | 1 refills | Status: DC

## 2016-07-31 NOTE — Telephone Encounter (Signed)
Sent to pharmacy 

## 2016-07-31 NOTE — Progress Notes (Signed)
I have reviewed the wellness visit and agree with documentation.  [name], M.D.

## 2016-07-31 NOTE — Progress Notes (Signed)
Patient's Name: Amanda Soto  MRN: [id number]  Referring Provider: [name], MD  DOB: Nov 26, 1924  PCP: [name], MD  DOS: 08/01/2016  Note by: Kathlen Brunswick. Dossie Arbour, MD  Service setting: Ambulatory outpatient  Specialty: Interventional Pain Management  Location: [facility] (AMB) Pain Management Facility    Patient type: Established   Primary Reason(s) for Visit: Encounter for post-procedure evaluation of chronic illness with mild to moderate exacerbation CC: Back Pain (right, lower)  HPI  Amanda Soto is a 83 y.o. year old, female patient, who comes today for a post-procedure evaluation. She has Chronic systolic heart failure ([place]); Bradycardia; Atrial fibrillation ([place]); HTN (hypertension); Abdominal discomfort; Exertional shortness of breath; Elevated glucose; Hypothyroidism; Lightheadedness; Symptomatic anemia; Anemia; Anxiety; Degeneration of intervertebral disc of lumbar region; Recurrent major depressive disorder, in partial remission ([place]); Cardiac murmur; H/O neoplasm; HLD (hyperlipidemia); Adult hypothyroidism; MI (mitral incompetence); OP (osteoporosis); Chronic low back pain (Location of Primary Source of Pain) (Right); Lumbar facet syndrome (Location of Primary Source of Pain) (Right); Grade 1 Anterolisthesis of L4 over L5 (5 mm); Lumbar spondylosis; Scoliosis of lumbar spine (concave to right side); Lumbar facet arthropathy; Osteoarthritis of hip (Right); Thoracic paracentral T7-8 disc protrusion; Depression; UTI (lower urinary tract infection); Bruising; Diarrhea; Macular degeneration; Chronic pain syndrome; and Tiredness on her problem list. Her primarily concern today is the Back Pain (right, lower)  Pain Assessment: Self-Reported Pain Score: 0-No pain/10             Reported level is compatible with observation.       Pain Type: Chronic pain Pain Location: Back Pain Orientation: Right, Lower Pain Descriptors / Indicators: Aching, Stabbing, Tender, Sore Pain Frequency:  Intermittent  Amanda Soto comes in today for post-procedure evaluation after the treatment done on 06/13/2016. Once again, the patient has attained 100% relief of the pain with the diagnostic right-sided lumbar facet block. Unfortunately, the results only last for approximately one month. Because of this, I would recommend going on to radiofrequency ablation in an effort to extend that relief.  Further details on both, my assessment(s), as well as the proposed treatment plan, please see below.  Post-Procedure Assessment  06/13/2016 Procedure: Palliative right-sided lumbar facet block #4 under fluoroscopic guidance and IV sedation Post-procedure pain score: 0/10 (100% relief) Influential Factors: BMI: 23.23 kg/m Intra-procedural challenges: None observed Assessment challenges: None detected         Post-procedural side-effects, adverse reactions, or complications: None reported Reported issues: None  Sedation: Sedation provided. When no sedatives are used, the analgesic levels obtained are directly associated to the effectiveness of the local anesthetics. However, when sedation is provided, the level of analgesia obtained during the initial 1 hour following the intervention, is believed to be the result of a combination of factors. These factors may include, but are not limited to: 1. The effectiveness of the local anesthetics used. 2. The effects of the analgesic(s) and/or anxiolytic(s) used. 3. The degree of discomfort experienced by the patient at the time of the procedure. 4. The patients ability and reliability in recalling and recording the events. 5. The presence and influence of possible secondary gains and/or psychosocial factors. Reported result: Relief experienced during the 1st hour after the procedure: 100 % (Ultra-Short Term Relief) Interpretative annotation: Analgesia during this period is likely to be Local Anesthetic and/or IV Sedative (Analgesic/Anxiolitic) related.           Effects of local anesthetic: The analgesic effects attained during this period are directly associated to the  localized infiltration of local anesthetics and therefore cary significant diagnostic value as to the etiological location, or anatomical origin, of the pain. Expected duration of relief is directly dependent on the pharmacodynamics of the local anesthetic used. Long-acting (4-6 hours) anesthetics used.  Reported result: Relief during the next 4 to 6 hour after the procedure: 20 % (Short-Term Relief) Interpretative annotation: Complete relief would suggest area to be the source of the pain.          Long-term benefit: Defined as the period of time past the expected duration of local anesthetics. With the possible exception of prolonged sympathetic blockade from the local anesthetics, benefits during this period are typically attributed to, or associated with, other factors such as analgesic sensory neuropraxia, antiinflammatory effects, or beneficial biochemical changes provided by agents other than the local anesthetics Reported result: Extended relief following procedure: 100 % (Long-Term Relief) Interpretative annotation: Good relief. This could suggest inflammation to be a significant component in the etiology to the pain.          Current benefits: Defined as persistent relief that continues at this point in time.   Reported results: Treated area: 100 % Amanda Soto reports improvement in function Interpretative annotation: Ongoing benefits would suggest effective therapeutic approach  Interpretation: Results would suggest a successful diagnostic intervention. The patient has failed to respond to conservative therapies including over-the-counter medications, anti-inflammatories, muscle relaxants, membrane stabilizers, opioids, physical therapy, modalities such as heat and ice, as well as more invasive techniques such as nerve blocks. Because Amanda Soto did attain more than 50% relief of the  pain during a series of diagnostic blocks conducted in separate occasions, I believe it is medically necessary to proceed with Radiofrequency Ablation, in order to attempt gaining longer relief.  Laboratory Chemistry  Inflammation Markers Lab Results  Component Value Date   ESRSEDRATE 18 07/25/2016   Renal Function Lab Results  Component Value Date   BUN 18 07/25/2016   CREATININE 0.52 07/25/2016   GFRAA >60 11/10/2015   GFRNONAA >60 11/10/2015   Hepatic Function Lab Results  Component Value Date   AST 22 07/25/2016   ALT 17 07/25/2016   ALBUMIN 3.9 07/25/2016   Electrolytes Lab Results  Component Value Date   NA 138 07/25/2016   K 3.7 07/25/2016   CL 102 07/25/2016   CALCIUM 9.1 07/25/2016   MG 2.1 11/10/2015   Pain Modulating Vitamins Lab Results  Component Value Date   VITAMINB12 227 11/10/2015   Coagulation Parameters Lab Results  Component Value Date   INR 1.3 02/05/2014   LABPROT 15.5 (H) 02/05/2014   APTT < 23.0 (L) 02/05/2014   PLT 207.0 07/25/2016   Cardiovascular Lab Results  Component Value Date   HGB 13.1 07/25/2016   HCT 39.7 07/25/2016   Note: Lab results reviewed.  Recent Diagnostic Imaging Review  Dg C-arm 1-60 Min-no Report  Result Date: 06/13/2016 There is no Radiologist interpretation  for this exam.  Note: Imaging results reviewed.          Meds  The patient has a current medication list which includes the following prescription(s): acetaminophen, apixaban, atorvastatin, bupropion, difluprednate, docusate sodium, furosemide, iron polysaccharides, levothyroxine, multivitamin, omeprazole, sotalol, and tramadol.  Current Outpatient Prescriptions on File Prior to Visit  Medication Sig  . acetaminophen (TYLENOL) 325 MG tablet Take 650 mg by mouth every 6 (six) hours as needed for moderate pain, fever or headache.  Marland Kitchen apixaban (ELIQUIS) 2.5 MG TABS tablet Take 2.5 mg by mouth 2 (  two) times daily.  Marland Kitchen atorvastatin (LIPITOR) 10 MG tablet  Take 1 tablet (10 mg total) by mouth daily.  Marland Kitchen buPROPion (WELLBUTRIN SR) 150 MG 12 hr tablet Take 150 mg (one tablet) by mouth daily for three days, then increase to 150 mg (one tablet) twice daily by mouth  . Difluprednate (DUREZOL) 0.05 % EMUL Frequency:PHARMDIR   Dosage:0.0     Instructions:  Note:One drop four times daily in the operative eye. Dose: 0.05 %  . docusate sodium (COLACE) 100 MG capsule Take by mouth 2 (two) times daily.   . furosemide (LASIX) 20 MG tablet TAKE 1 TABLET (20 MG TOTAL) BY MOUTH ONCE DAILY.  . iron polysaccharides (NIFEREX) 150 MG capsule Take 1 capsule (150 mg total) by mouth daily.  Marland Kitchen levothyroxine (SYNTHROID, LEVOTHROID) 50 MCG tablet Take 1 tablet (50 mcg total) by mouth daily before breakfast.  . Multiple Vitamin (MULTIVITAMIN) capsule Take by mouth.  Marland Kitchen omeprazole (PRILOSEC) 20 MG capsule Take 20 mg by mouth daily.  . sotalol (BETAPACE) 80 MG tablet Take 40 mg by mouth 2 (two) times daily.   . traMADol (ULTRAM-ER) 300 MG 24 hr tablet TAKE 1 TABLET BY MOUTH ONCE A DAY   No current facility-administered medications on file prior to visit.    ROS  Constitutional: Denies any fever or chills Gastrointestinal: No reported hemesis, hematochezia, vomiting, or acute GI distress Musculoskeletal: Denies any acute onset joint swelling, redness, loss of ROM, or weakness Neurological: No reported episodes of acute onset apraxia, aphasia, dysarthria, agnosia, amnesia, paralysis, loss of coordination, or loss of consciousness  Allergies  Amanda Soto is allergic to hydrocodone; ciprofloxacin; and latex.  PFSH  Drug: Amanda Soto  reports that she does not use drugs. Alcohol:  reports that she does not drink alcohol. Tobacco:  reports that she has never smoked. She has never used smokeless tobacco. Medical:  has a past medical history of Arthritis; Atrial fibrillation ([place]); CHF (congestive heart failure) ([place]); Chickenpox; Chronic abdominal pain (01/16/2014); Degenerative  arthritis of hip (08/19/2014); Degenerative arthritis of lumbar spine (11/18/2013); Depression; Diverticulitis; GERD (gastroesophageal reflux disease); Heart murmur; Hyperlipidemia; Hypertension; Hypothyroid; Neuritis or radiculitis due to rupture of lumbar intervertebral disc (11/18/2013); Skin cancer; and Trochanteric bursitis of right hip (11/18/2013). Family: family history includes Breast cancer in her mother and sister; Stroke in her maternal grandmother.  Past Surgical History:  Procedure Laterality Date  . APPENDECTOMY    . CATARACT EXTRACTION    . PARTIAL HYSTERECTOMY    . TONSILLECTOMY     Constitutional Exam  General appearance: Well nourished, well developed, and well hydrated. In no apparent acute distress Vitals:   08/01/16 1348  BP: (!) 166/50  Pulse: (!) 52  Resp: 16  Temp: 98.2 F (36.8 C)  TempSrc: Oral  SpO2: 99%  Weight: 115 lb (52.2 kg)  Height: 4\' 11"  (1.499 m)   BMI Assessment: Estimated body mass index is 23.23 kg/m as calculated from the following:   Height as of this encounter: 4\' 11"  (1.499 m).   Weight as of this encounter: 115 lb (52.2 kg).  BMI interpretation table: BMI level Category Range association with higher incidence of chronic pain  <18 kg/m2 Underweight   18.5-24.9 kg/m2 Ideal body weight   25-29.9 kg/m2 Overweight Increased incidence by 20%  30-34.9 kg/m2 Obese (Class I) Increased incidence by 68%  35-39.9 kg/m2 Severe obesity (Class II) Increased incidence by 136%  >40 kg/m2 Extreme obesity (Class III) Increased incidence by 254%   BMI Readings from  Last 4 Encounters:  08/01/16 23.23 kg/m  07/25/16 23.67 kg/m  06/13/16 22.24 kg/m  05/09/16 23.24 kg/m   Wt Readings from Last 4 Encounters:  08/01/16 115 lb (52.2 kg)  07/25/16 119 lb 3.2 oz (54.1 kg)  06/13/16 112 lb (50.8 kg)  05/09/16 117 lb (53.1 kg)  Psych/Mental status: Alert, oriented x 3 (person, place, & time)       Eyes: PERLA Respiratory: No evidence of acute  respiratory distress  Cervical Spine Exam  Inspection: No masses, redness, or swelling Alignment: Symmetrical Functional ROM: Unrestricted ROM Stability: No instability detected Muscle strength & Tone: Functionally intact Sensory: Unimpaired Palpation: Non-contributory  Upper Extremity (UE) Exam    Side: Right upper extremity  Side: Left upper extremity  Inspection: No masses, redness, swelling, or asymmetry  Inspection: No masses, redness, swelling, or asymmetry  Functional ROM: Unrestricted ROM          Functional ROM: Unrestricted ROM          Muscle strength & Tone: Functionally intact  Muscle strength & Tone: Functionally intact  Sensory: Unimpaired  Sensory: Unimpaired  Palpation: Non-contributory  Palpation: Non-contributory   Thoracic Spine Exam  Inspection: increased thoracic Kyphosis Alignment: Symmetrical Functional ROM: Unrestricted ROM Stability: No instability detected Sensory: Unimpaired Muscle strength & Tone: Functionally intact Palpation: Non-contributory  Lumbar Spine Exam  Inspection: No masses, redness, or swelling Alignment: Symmetrical Functional ROM: Decreased ROM Stability: No instability detected Muscle strength & Tone: Functionally intact Sensory: Movement-associated pain Palpation: Complains of area being tender to palpation Provocative Tests: Lumbar Hyperextension and rotation test: Positive on the right for facet joint pain. Patrick's Maneuver: evaluation deferred today              Gait & Posture Assessment  Ambulation: Patient ambulates using a cane Gait: Limited. Using assistive device to ambulate Posture: Thoracic kyphosis   Lower Extremity Exam    Side: Right lower extremity  Side: Left lower extremity  Inspection: No masses, redness, swelling, or asymmetry  Inspection: No masses, redness, swelling, or asymmetry  Functional ROM: Unrestricted ROM          Functional ROM: Unrestricted ROM          Muscle strength & Tone: Functionally  intact  Muscle strength & Tone: Functionally intact  Sensory: Unimpaired  Sensory: Unimpaired  Palpation: Non-contributory  Palpation: Non-contributory   Assessment  Primary Diagnosis & Pertinent Problem List: The primary encounter diagnosis was Chronic pain syndrome. Diagnoses of Chronic low back pain (Location of Primary Source of Pain) (Right), Lumbar facet syndrome (Location of Primary Source of Pain) (Right), Lumbar facet arthropathy, and Lumbar spondylosis were also pertinent to this visit.  Status Diagnosis  Controlled Controlled Controlled 1. Chronic pain syndrome   2. Chronic low back pain (Location of Primary Source of Pain) (Right)   3. Lumbar facet syndrome (Location of Primary Source of Pain) (Right)   4. Lumbar facet arthropathy   5. Lumbar spondylosis      Plan of Care  Pharmacotherapy (Medications Ordered): No orders of the defined types were placed in this encounter.  New Prescriptions   No medications on file   Medications administered today: Amanda Soto had no medications administered during this visit. Lab-work, procedure(s), and/or referral(s): Orders Placed This Encounter  Procedures  . Radiofrequency,Lumbar  . LUMBAR FACET(MEDIAL BRANCH NERVE BLOCK) MBNB   Imaging and/or referral(s): None  Interventional therapies: Planned, scheduled, and/or pending:   Stop Elaquis for 3 days then Right-sided lumbar facet radiofrequency ablation under  fluoroscopic guidance and IV sedation.    Considering:   Right-sided lumbar facet radiofrequency ablation under fluoroscopic guidance and IV sedation.    Palliative PRN treatment(s):   Palliative right-sided lumbar facet block under fluoroscopic guidance and IV sedation    Provider-requested follow-up: Return in about 1 month (around 08/29/2016) for Procedure: Right sided lumbar facet RFA.  Future Appointments Date Time Provider [place]  10/24/2016 1:30 PM [name], MD [facility] None  07/26/2017  3:00 PM Denisa L O'Brien-Blaney, LPN [facility] None   Primary Care Physician: [name], MD Location: [facility] Out[facility] Management [facility] Note by: Kathlen Brunswick. Dossie Arbour, M.D, DABA, DABAPM, DABPM, DABIPP, FIPP Date: 08/01/2016; Time: 2:27 PM  Pain Score Disclaimer: We use the NRS-11 scale. This is a self-reported, subjective measurement of pain severity with only modest accuracy. It is used primarily to identify changes within a particular patient. It must be understood that outpatient pain scales are significantly less accurate that those used for research, where they can be applied under ideal controlled circumstances with minimal exposure to variables. In reality, the score is likely to be a combination of pain intensity and pain affect, where pain affect describes the degree of emotional arousal or changes in action readiness caused by the sensory experience of pain. Factors such as social and work situation, setting, emotional state, anxiety levels, expectation, and prior pain experience may influence pain perception and show large inter-individual differences that may also be affected by time variables.  Patient instructions provided during this appointment: Patient Instructions   Radiofrequency Lesioning Introduction Radiofrequency lesioning is a procedure that is performed to relieve pain. The procedure is often used for back, neck, or arm pain. Radiofrequency lesioning involves the use of a machine that creates radio waves to make heat. During the procedure, the heat is applied to the nerve that carries the pain signal. The heat damages the nerve and interferes with the pain signal. Pain relief usually starts about 2 weeks after the procedure and lasts for 6 months to 1 year. Tell a health care provider about:  Any allergies you have.  All medicines you are taking, including vitamins, herbs, eye drops, creams, and over-the-counter medicines.  Any problems you or family  members have had with anesthetic medicines.  Any blood disorders you have.  Any surgeries you have had.  Any medical conditions you have.  Whether you are pregnant or may be pregnant. What are the risks? Generally, this is a safe procedure. However, problems may occur, including:  Pain or soreness at the injection site.  Infection at the injection site.  Damage to nerves or blood vessels. What happens before the procedure?  Ask your health care provider about:  Changing or stopping your regular medicines. This is especially important if you are taking diabetes medicines or blood thinners.  Taking medicines such as aspirin and ibuprofen. These medicines can thin your blood. Do not take these medicines before your procedure if your health care provider instructs you not to.  Follow instructions from your health care provider about eating or drinking restrictions.  Plan to have someone take you home after the procedure.  If you go home right after the procedure, plan to have someone with you for 24 hours. What happens during the procedure?  You will be given one or more of the following:  A medicine to help you relax (sedative).  A medicine to numb the area (local anesthetic).  You will be awake during the procedure. You will  need to be able to talk with the health care provider during the procedure.  With the help of a type of X-ray (fluoroscopy), the health care provider will insert a radiofrequency needle into the area to be treated.  Next, a wire that carries the radio waves (electrode) will be put through the radiofrequency needle. An electrical pulse will be sent through the electrode to verify the correct nerve. You will feel a tingling sensation, and you may have muscle twitching.  Then, the tissue that is around the needle tip will be heated by an electric current that is passed using the radiofrequency machine. This will numb the nerves.  A bandage (dressing) will  be put on the insertion area after the procedure is done. The procedure may vary among health care providers and hospitals. What happens after the procedure?  Your blood pressure, heart rate, breathing rate, and blood oxygen level will be monitored often until the medicines you were given have worn off.  Return to your normal activities as directed by your health care provider. This information is not intended to replace advice given to you by your health care provider. Make sure you discuss any questions you have with your health care provider. Document Released: 02/14/2011 Document Revised: 11/24/2015 Document Reviewed: 07/26/2014  2017 Elsevier GENERAL RISKS AND COMPLICATIONS  What are the risk, side effects and possible complications? Generally speaking, most procedures are safe.  However, with any procedure there are risks, side effects, and the possibility of complications.  The risks and complications are dependent upon the sites that are lesioned, or the type of nerve block to be performed.  The closer the procedure is to the spine, the more serious the risks are.  Great care is taken when placing the radio frequency needles, block needles or lesioning probes, but sometimes complications can occur. 1. Infection: Any time there is an injection through the skin, there is a risk of infection.  This is why sterile conditions are used for these blocks.  There are four possible types of infection. 1. Localized skin infection. 2. Central Nervous System Infection-This can be in the form of Meningitis, which can be deadly. 3. Epidural Infections-This can be in the form of an epidural abscess, which can cause pressure inside of the spine, causing compression of the spinal cord with subsequent paralysis. This would require an emergency surgery to decompress, and there are no guarantees that the patient would recover from the paralysis. 4. Discitis-This is an infection of the intervertebral discs.  It  occurs in about 1% of discography procedures.  It is difficult to treat and it may lead to surgery.        2. Pain: the needles have to go through skin and soft tissues, will cause soreness.       3. Damage to internal structures:  The nerves to be lesioned may be near blood vessels or    other nerves which can be potentially damaged.       4. Bleeding: Bleeding is more common if the patient is taking blood thinners such as  aspirin, Coumadin, Ticiid, Plavix, etc., or if he/she have some genetic predisposition  such as hemophilia. Bleeding into the spinal canal can cause compression of the spinal  cord with subsequent paralysis.  This would require an emergency surgery to  decompress and there are no guarantees that the patient would recover from the  paralysis.       5. Pneumothorax:  Puncturing of a lung is a  possibility, every time a needle is introduced in  the area of the chest or upper back.  Pneumothorax refers to free air around the  collapsed lung(s), inside of the thoracic cavity (chest cavity).  Another two possible  complications related to a similar event would include: Hemothorax and Chylothorax.   These are variations of the Pneumothorax, where instead of air around the collapsed  lung(s), you may have blood or chyle, respectively.       6. Spinal headaches: They may occur with any procedures in the area of the spine.       7. Persistent CSF (Cerebro-Spinal Fluid) leakage: This is a rare problem, but may occur  with prolonged intrathecal or epidural catheters either due to the formation of a fistulous  track or a dural tear.       8. Nerve damage: By working so close to the spinal cord, there is always a possibility of  nerve damage, which could be as serious as a permanent spinal cord injury with  paralysis.       9. Death:  Although rare, severe deadly allergic reactions known as "Anaphylactic  reaction" can occur to any of the medications used.      10. Worsening of the symptoms:  We can  always make thing worse.  What are the chances of something like this happening? Chances of any of this occuring are extremely low.  By statistics, you have more of a chance of getting killed in a motor vehicle accident: while driving to the hospital than any of the above occurring .  Nevertheless, you should be aware that they are possibilities.  In general, it is similar to taking a shower.  Everybody knows that you can slip, hit your head and get killed.  Does that mean that you should not shower again?  Nevertheless always keep in mind that statistics do not mean anything if you happen to be on the wrong side of them.  Even if a procedure has a 1 (one) in a 1,000,000 (million) chance of going wrong, it you happen to be that one..Also, keep in mind that by statistics, you have more of a chance of having something go wrong when taking medications.  Who should not have this procedure? If you are on a blood thinning medication (e.g. Coumadin, Plavix, see list of "Blood Thinners"), or if you have an active infection going on, you should not have the procedure.  If you are taking any blood thinners, please inform your physician.  How should I prepare for this procedure?  Do not eat or drink anything at least six hours prior to the procedure.  Bring a driver with you .  It cannot be a taxi.  Come accompanied by an adult that can drive you back, and that is strong enough to help you if your legs get weak or numb from the local anesthetic.  Take all of your medicines the morning of the procedure with just enough water to swallow them.  If you have diabetes, make sure that you are scheduled to have your procedure done first thing in the morning, whenever possible.  If you have diabetes, take only half of your insulin dose and notify our nurse that you have done so as soon as you arrive at the clinic.  If you are diabetic, but only take blood sugar pills (oral hypoglycemic), then do not take them on  the morning of your procedure.  You may take them after you have  had the procedure.  Do not take aspirin or any aspirin-containing medications, at least eleven (11) days prior to the procedure.  They may prolong bleeding.  Wear loose fitting clothing that may be easy to take off and that you would not mind if it got stained with Betadine or blood.  Do not wear any jewelry or perfume  Remove any nail coloring.  It will interfere with some of our monitoring equipment.  NOTE: Remember that this is not meant to be interpreted as a complete list of all possible complications.  Unforeseen problems may occur.  BLOOD THINNERS The following drugs contain aspirin or other products, which can cause increased bleeding during surgery and should not be taken for 2 weeks prior to and 1 week after surgery.  If you should need take something for relief of minor pain, you may take acetaminophen which is found in Tylenol,m Datril, Anacin-3 and Panadol. It is not blood thinner. The products listed below are.  Do not take any of the products listed below in addition to any listed on your instruction sheet.  A.P.C or A.P.C with Codeine Codeine Phosphate Capsules #3 Ibuprofen Ridaura  ABC compound Congesprin Imuran rimadil  Advil Cope Indocin Robaxisal  Alka-Seltzer Effervescent Pain Reliever and Antacid Coricidin or Coricidin-D  Indomethacin Rufen  Alka-Seltzer plus Cold Medicine Cosprin Ketoprofen S-A-C Tablets  Anacin Analgesic Tablets or Capsules Coumadin Korlgesic Salflex  Anacin Extra Strength Analgesic tablets or capsules CP-2 Tablets Lanoril Salicylate  Anaprox Cuprimine Capsules Levenox Salocol  Anexsia-D Dalteparin Magan Salsalate  Anodynos Darvon compound Magnesium Salicylate Sine-off  Ansaid Dasin Capsules Magsal Sodium Salicylate  Anturane Depen Capsules Marnal Soma  APF Arthritis pain formula Dewitt's Pills Measurin Stanback  Argesic Dia-Gesic Meclofenamic Sulfinpyrazone  Arthritis Bayer Timed  Release Aspirin Diclofenac Meclomen Sulindac  Arthritis pain formula Anacin Dicumarol Medipren Supac  Analgesic (Safety coated) Arthralgen Diffunasal Mefanamic Suprofen  Arthritis Strength Bufferin Dihydrocodeine Mepro Compound Suprol  Arthropan liquid Dopirydamole Methcarbomol with Aspirin Synalgos  ASA tablets/Enseals Disalcid Micrainin Tagament  Ascriptin Doan's Midol Talwin  Ascriptin A/D Dolene Mobidin Tanderil  Ascriptin Extra Strength Dolobid Moblgesic Ticlid  Ascriptin with Codeine Doloprin or Doloprin with Codeine Momentum Tolectin  Asperbuf Duoprin Mono-gesic Trendar  Aspergum Duradyne Motrin or Motrin IB Triminicin  Aspirin plain, buffered or enteric coated Durasal Myochrisine Trigesic  Aspirin Suppositories Easprin Nalfon Trillsate  Aspirin with Codeine Ecotrin Regular or Extra Strength Naprosyn Uracel  Atromid-S Efficin Naproxen Ursinus  Auranofin Capsules Elmiron Neocylate Vanquish  Axotal Emagrin Norgesic Verin  Azathioprine Empirin or Empirin with Codeine Normiflo Vitamin E  Azolid Emprazil Nuprin Voltaren  Bayer Aspirin plain, buffered or children's or timed BC Tablets or powders Encaprin Orgaran Warfarin Sodium  Buff-a-Comp Enoxaparin Orudis Zorpin  Buff-a-Comp with Codeine Equegesic Os-Cal-Gesic   Buffaprin Excedrin plain, buffered or Extra Strength Oxalid   Bufferin Arthritis Strength Feldene Oxphenbutazone   Bufferin plain or Extra Strength Feldene Capsules Oxycodone with Aspirin   Bufferin with Codeine Fenoprofen Fenoprofen Pabalate or Pabalate-SF   Buffets II Flogesic Panagesic   Buffinol plain or Extra Strength Florinal or Florinal with Codeine Panwarfarin   Buf-Tabs Flurbiprofen Penicillamine   Butalbital Compound Four-way cold tablets Penicillin   Butazolidin Fragmin Pepto-Bismol   Carbenicillin Geminisyn Percodan   Carna Arthritis Reliever Geopen Persantine   Carprofen Gold's salt Persistin   Chloramphenicol Goody's Phenylbutazone   Chloromycetin  Haltrain Piroxlcam   Clmetidine heparin Plaquenil   Cllnoril Hyco-pap Ponstel   Clofibrate Hydroxy chloroquine Propoxyphen  Before stopping any of these medications, be sure to consult the physician who ordered them.  Some, such as Coumadin (Warfarin) are ordered to prevent or treat serious conditions such as "deep thrombosis", "pumonary embolisms", and other heart problems.  The amount of time that you [place] need off of the medication Diegel also vary with the medication and the reason for which you were taking it.  If you are taking any of these medications, please make sure you notify your pain physician before you undergo any procedures.

## 2016-07-31 NOTE — Telephone Encounter (Signed)
Patient states she in on 46mcg, looked in chart and it states levothyroxine 153mcg was put in chart by pain management RN on 06/13/16. Patient states she does not have anymore medication. Per dr.Sonnenberg informed patient to skip today and start 14mcg tomorrow. Please [facility]

## 2016-08-01 ENCOUNTER — Encounter: Payer: Self-pay | Admitting: Pain Medicine

## 2016-08-01 ENCOUNTER — Ambulatory Visit: Payer: Medicare Other | Attending: Pain Medicine | Admitting: Pain Medicine

## 2016-08-01 VITALS — BP 166/50 | HR 52 | Temp 98.2°F | Resp 16 | Ht 59.0 in | Wt 115.0 lb

## 2016-08-01 DIAGNOSIS — I110 Hypertensive heart disease with heart failure: Secondary | ICD-10-CM | POA: Insufficient documentation

## 2016-08-01 DIAGNOSIS — D649 Anemia, unspecified: Secondary | ICD-10-CM | POA: Insufficient documentation

## 2016-08-01 DIAGNOSIS — M545 Low back pain: Secondary | ICD-10-CM

## 2016-08-01 DIAGNOSIS — Z90710 Acquired absence of both cervix and uterus: Secondary | ICD-10-CM | POA: Insufficient documentation

## 2016-08-01 DIAGNOSIS — R0602 Shortness of breath: Secondary | ICD-10-CM | POA: Diagnosis not present

## 2016-08-01 DIAGNOSIS — H3530 Unspecified macular degeneration: Secondary | ICD-10-CM | POA: Insufficient documentation

## 2016-08-01 DIAGNOSIS — E039 Hypothyroidism, unspecified: Secondary | ICD-10-CM | POA: Diagnosis not present

## 2016-08-01 DIAGNOSIS — G894 Chronic pain syndrome: Secondary | ICD-10-CM

## 2016-08-01 DIAGNOSIS — Z79899 Other long term (current) drug therapy: Secondary | ICD-10-CM | POA: Insufficient documentation

## 2016-08-01 DIAGNOSIS — K219 Gastro-esophageal reflux disease without esophagitis: Secondary | ICD-10-CM | POA: Diagnosis not present

## 2016-08-01 DIAGNOSIS — Z9889 Other specified postprocedural states: Secondary | ICD-10-CM | POA: Insufficient documentation

## 2016-08-01 DIAGNOSIS — M47816 Spondylosis without myelopathy or radiculopathy, lumbar region: Secondary | ICD-10-CM

## 2016-08-01 DIAGNOSIS — E785 Hyperlipidemia, unspecified: Secondary | ICD-10-CM | POA: Diagnosis not present

## 2016-08-01 DIAGNOSIS — M5116 Intervertebral disc disorders with radiculopathy, lumbar region: Secondary | ICD-10-CM | POA: Insufficient documentation

## 2016-08-01 DIAGNOSIS — R001 Bradycardia, unspecified: Secondary | ICD-10-CM | POA: Diagnosis not present

## 2016-08-01 DIAGNOSIS — F329 Major depressive disorder, single episode, unspecified: Secondary | ICD-10-CM | POA: Diagnosis not present

## 2016-08-01 DIAGNOSIS — F419 Anxiety disorder, unspecified: Secondary | ICD-10-CM | POA: Insufficient documentation

## 2016-08-01 DIAGNOSIS — Z888 Allergy status to other drugs, medicaments and biological substances status: Secondary | ICD-10-CM | POA: Insufficient documentation

## 2016-08-01 DIAGNOSIS — M419 Scoliosis, unspecified: Secondary | ICD-10-CM | POA: Insufficient documentation

## 2016-08-01 DIAGNOSIS — M1288 Other specific arthropathies, not elsewhere classified, other specified site: Secondary | ICD-10-CM

## 2016-08-01 DIAGNOSIS — I4891 Unspecified atrial fibrillation: Secondary | ICD-10-CM | POA: Diagnosis not present

## 2016-08-01 DIAGNOSIS — G8929 Other chronic pain: Secondary | ICD-10-CM

## 2016-08-01 DIAGNOSIS — I5022 Chronic systolic (congestive) heart failure: Secondary | ICD-10-CM | POA: Diagnosis not present

## 2016-08-01 DIAGNOSIS — M549 Dorsalgia, unspecified: Secondary | ICD-10-CM | POA: Insufficient documentation

## 2016-08-01 DIAGNOSIS — R197 Diarrhea, unspecified: Secondary | ICD-10-CM | POA: Insufficient documentation

## 2016-08-01 DIAGNOSIS — R42 Dizziness and giddiness: Secondary | ICD-10-CM | POA: Diagnosis not present

## 2016-08-01 DIAGNOSIS — N390 Urinary tract infection, site not specified: Secondary | ICD-10-CM | POA: Insufficient documentation

## 2016-08-01 NOTE — Progress Notes (Signed)
Safety precautions to be maintained throughout the outpatient stay will include: orient to surroundings, keep bed in low position, maintain call bell within reach at all times, provide assistance with transfer out of bed and ambulation.  

## 2016-08-01 NOTE — Patient Instructions (Signed)
Radiofrequency Lesioning Introduction Radiofrequency lesioning is a procedure that is performed to relieve pain. The procedure is often used for back, neck, or arm pain. Radiofrequency lesioning involves the use of a machine that creates radio waves to make heat. During the procedure, the heat is applied to the nerve that carries the pain signal. The heat damages the nerve and interferes with the pain signal. Pain relief usually starts about 2 weeks after the procedure and lasts for 6 months to 1 year. Tell a health care provider about:  Any allergies you have.  All medicines you are taking, including vitamins, herbs, eye drops, creams, and over-the-counter medicines.  Any problems you or family members have had with anesthetic medicines.  Any blood disorders you have.  Any surgeries you have had.  Any medical conditions you have.  Whether you are pregnant or may be pregnant. What are the risks? Generally, this is a safe procedure. However, problems may occur, including:  Pain or soreness at the injection site.  Infection at the injection site.  Damage to nerves or blood vessels. What happens before the procedure?  Ask your health care provider about:  Changing or stopping your regular medicines. This is especially important if you are taking diabetes medicines or blood thinners.  Taking medicines such as aspirin and ibuprofen. These medicines can thin your blood. Do not take these medicines before your procedure if your health care provider instructs you not to.  Follow instructions from your health care provider about eating or drinking restrictions.  Plan to have someone take you home after the procedure.  If you go home right after the procedure, plan to have someone with you for 24 hours. What happens during the procedure?  You will be given one or more of the following:  A medicine to help you relax (sedative).  A medicine to numb the area (local anesthetic).  You  will be awake during the procedure. You will need to be able to talk with the health care provider during the procedure.  With the help of a type of X-ray (fluoroscopy), the health care provider will insert a radiofrequency needle into the area to be treated.  Next, a wire that carries the radio waves (electrode) will be put through the radiofrequency needle. An electrical pulse will be sent through the electrode to verify the correct nerve. You will feel a tingling sensation, and you may have muscle twitching.  Then, the tissue that is around the needle tip will be heated by an electric current that is passed using the radiofrequency machine. This will numb the nerves.  A bandage (dressing) will be put on the insertion area after the procedure is done. The procedure may vary among health care providers and hospitals. What happens after the procedure?  Your blood pressure, heart rate, breathing rate, and blood oxygen level will be monitored often until the medicines you were given have worn off.  Return to your normal activities as directed by your health care provider. This information is not intended to replace advice given to you by your health care provider. Make sure you discuss any questions you have with your health care provider. Document Released: 02/14/2011 Document Revised: 11/24/2015 Document Reviewed: 07/26/2014  2017 Elsevier GENERAL RISKS AND COMPLICATIONS  What are the risk, side effects and possible complications? Generally speaking, most procedures are safe.  However, with any procedure there are risks, side effects, and the possibility of complications.  The risks and complications are dependent upon the sites  that are lesioned, or the type of nerve block to be performed.  The closer the procedure is to the spine, the more serious the risks are.  Great care is taken when placing the radio frequency needles, block needles or lesioning probes, but sometimes complications can  occur. 1. Infection: Any time there is an injection through the skin, there is a risk of infection.  This is why sterile conditions are used for these blocks.  There are four possible types of infection. 1. Localized skin infection. 2. Central Nervous System Infection-This can be in the form of Meningitis, which can be deadly. 3. Epidural Infections-This can be in the form of an epidural abscess, which can cause pressure inside of the spine, causing compression of the spinal cord with subsequent paralysis. This would require an emergency surgery to decompress, and there are no guarantees that the patient would recover from the paralysis. 4. Discitis-This is an infection of the intervertebral discs.  It occurs in about 1% of discography procedures.  It is difficult to treat and it may lead to surgery.        2. Pain: the needles have to go through skin and soft tissues, will cause soreness.       3. Damage to internal structures:  The nerves to be lesioned may be near blood vessels or    other nerves which can be potentially damaged.       4. Bleeding: Bleeding is more common if the patient is taking blood thinners such as  aspirin, Coumadin, Ticiid, Plavix, etc., or if he/she have some genetic predisposition  such as hemophilia. Bleeding into the spinal canal can cause compression of the spinal  cord with subsequent paralysis.  This would require an emergency surgery to  decompress and there are no guarantees that the patient would recover from the  paralysis.       5. Pneumothorax:  Puncturing of a lung is a possibility, every time a needle is introduced in  the area of the chest or upper back.  Pneumothorax refers to free air around the  collapsed lung(s), inside of the thoracic cavity (chest cavity).  Another two possible  complications related to a similar event would include: Hemothorax and Chylothorax.   These are variations of the Pneumothorax, where instead of air around the collapsed  lung(s),  you may have blood or chyle, respectively.       6. Spinal headaches: They may occur with any procedures in the area of the spine.       7. Persistent CSF (Cerebro-Spinal Fluid) leakage: This is a rare problem, but may occur  with prolonged intrathecal or epidural catheters either due to the formation of a fistulous  track or a dural tear.       8. Nerve damage: By working so close to the spinal cord, there is always a possibility of  nerve damage, which could be as serious as a permanent spinal cord injury with  paralysis.       9. Death:  Although rare, severe deadly allergic reactions known as "Anaphylactic  reaction" can occur to any of the medications used.      10. Worsening of the symptoms:  We can always make thing worse.  What are the chances of something like this happening? Chances of any of this occuring are extremely low.  By statistics, you have more of a chance of getting killed in a motor vehicle accident: while driving to the hospital than any of  the above occurring .  Nevertheless, you should be aware that they are possibilities.  In general, it is similar to taking a shower.  Everybody knows that you can slip, hit your head and get killed.  Does that mean that you should not shower again?  Nevertheless always keep in mind that statistics do not mean anything if you happen to be on the wrong side of them.  Even if a procedure has a 1 (one) in a 1,000,000 (million) chance of going wrong, it you happen to be that one..Also, keep in mind that by statistics, you have more of a chance of having something go wrong when taking medications.  Who should not have this procedure? If you are on a blood thinning medication (e.g. Coumadin, Plavix, see list of "Blood Thinners"), or if you have an active infection going on, you should not have the procedure.  If you are taking any blood thinners, please inform your physician.  How should I prepare for this procedure?  Do not eat or drink anything at  least six hours prior to the procedure.  Bring a driver with you .  It cannot be a taxi.  Come accompanied by an adult that can drive you back, and that is strong enough to help you if your legs get weak or numb from the local anesthetic.  Take all of your medicines the morning of the procedure with just enough water to swallow them.  If you have diabetes, make sure that you are scheduled to have your procedure done first thing in the morning, whenever possible.  If you have diabetes, take only half of your insulin dose and notify our nurse that you have done so as soon as you arrive at the clinic.  If you are diabetic, but only take blood sugar pills (oral hypoglycemic), then do not take them on the morning of your procedure.  You may take them after you have had the procedure.  Do not take aspirin or any aspirin-containing medications, at least eleven (11) days prior to the procedure.  They may prolong bleeding.  Wear loose fitting clothing that may be easy to take off and that you would not mind if it got stained with Betadine or blood.  Do not wear any jewelry or perfume  Remove any nail coloring.  It will interfere with some of our monitoring equipment.  NOTE: Remember that this is not meant to be interpreted as a complete list of all possible complications.  Unforeseen problems may occur.  BLOOD THINNERS The following drugs contain aspirin or other products, which can cause increased bleeding during surgery and should not be taken for 2 weeks prior to and 1 week after surgery.  If you should need take something for relief of minor pain, you may take acetaminophen which is found in Tylenol,m Datril, Anacin-3 and Panadol. It is not blood thinner. The products listed below are.  Do not take any of the products listed below in addition to any listed on your instruction sheet.  A.P.C or A.P.C with Codeine Codeine Phosphate Capsules #3 Ibuprofen Ridaura  ABC compound Congesprin Imuran  rimadil  Advil Cope Indocin Robaxisal  Alka-Seltzer Effervescent Pain Reliever and Antacid Coricidin or Coricidin-D  Indomethacin Rufen  Alka-Seltzer plus Cold Medicine Cosprin Ketoprofen S-A-C Tablets  Anacin Analgesic Tablets or Capsules Coumadin Korlgesic Salflex  Anacin Extra Strength Analgesic tablets or capsules CP-2 Tablets Lanoril Salicylate  Anaprox Cuprimine Capsules Levenox Salocol  Anexsia-D Dalteparin Magan Salsalate  Anodynos Darvon  compound Magnesium Salicylate Sine-off  Ansaid Dasin Capsules Magsal Sodium Salicylate  Anturane Depen Capsules Marnal Soma  APF Arthritis pain formula Dewitt's Pills Measurin Stanback  Argesic Dia-Gesic Meclofenamic Sulfinpyrazone  Arthritis Bayer Timed Release Aspirin Diclofenac Meclomen Sulindac  Arthritis pain formula Anacin Dicumarol Medipren Supac  Analgesic (Safety coated) Arthralgen Diffunasal Mefanamic Suprofen  Arthritis Strength Bufferin Dihydrocodeine Mepro Compound Suprol  Arthropan liquid Dopirydamole Methcarbomol with Aspirin Synalgos  ASA tablets/Enseals Disalcid Micrainin Tagament  Ascriptin Doan's Midol Talwin  Ascriptin A/D Dolene Mobidin Tanderil  Ascriptin Extra Strength Dolobid Moblgesic Ticlid  Ascriptin with Codeine Doloprin or Doloprin with Codeine Momentum Tolectin  Asperbuf Duoprin Mono-gesic Trendar  Aspergum Duradyne Motrin or Motrin IB Triminicin  Aspirin plain, buffered or enteric coated Durasal Myochrisine Trigesic  Aspirin Suppositories Easprin Nalfon Trillsate  Aspirin with Codeine Ecotrin Regular or Extra Strength Naprosyn Uracel  Atromid-S Efficin Naproxen Ursinus  Auranofin Capsules Elmiron Neocylate Vanquish  Axotal Emagrin Norgesic Verin  Azathioprine Empirin or Empirin with Codeine Normiflo Vitamin E  Azolid Emprazil Nuprin Voltaren  Bayer Aspirin plain, buffered or children's or timed BC Tablets or powders Encaprin Orgaran Warfarin Sodium  Buff-a-Comp Enoxaparin Orudis Zorpin  Buff-a-Comp with  Codeine Equegesic Os-Cal-Gesic   Buffaprin Excedrin plain, buffered or Extra Strength Oxalid   Bufferin Arthritis Strength Feldene Oxphenbutazone   Bufferin plain or Extra Strength Feldene Capsules Oxycodone with Aspirin   Bufferin with Codeine Fenoprofen Fenoprofen Pabalate or Pabalate-SF   Buffets II Flogesic Panagesic   Buffinol plain or Extra Strength Florinal or Florinal with Codeine Panwarfarin   Buf-Tabs Flurbiprofen Penicillamine   Butalbital Compound Four-way cold tablets Penicillin   Butazolidin Fragmin Pepto-Bismol   Carbenicillin Geminisyn Percodan   Carna Arthritis Reliever Geopen Persantine   Carprofen Gold's salt Persistin   Chloramphenicol Goody's Phenylbutazone   Chloromycetin Haltrain Piroxlcam   Clmetidine heparin Plaquenil   Cllnoril Hyco-pap Ponstel   Clofibrate Hydroxy chloroquine Propoxyphen         Before stopping any of these medications, be sure to consult the physician who ordered them.  Some, such as Coumadin (Warfarin) are ordered to prevent or treat serious conditions such as "deep thrombosis", "pumonary embolisms", and other heart problems.  The amount of time that you may need off of the medication may also vary with the medication and the reason for which you were taking it.  If you are taking any of these medications, please make sure you notify your pain physician before you undergo any procedures.

## 2016-08-13 ENCOUNTER — Telehealth: Payer: Self-pay | Admitting: *Deleted

## 2016-08-13 NOTE — Telephone Encounter (Signed)
Blood pressures are well controlled. No change to medications. Heart rate is on the low side. Suggest follow-up with cardiologist regarding this.

## 2016-08-13 NOTE — Telephone Encounter (Signed)
Pt was advised to report blood pressure readings  137/61 pulse 49 124/56 pulse 47 145/58 pulse 46 162/76 pulse 59 142/62 pulse 50 136/66 pulse 55 141/60 pulse 49

## 2016-08-13 NOTE — Telephone Encounter (Signed)
fyi

## 2016-08-13 NOTE — Telephone Encounter (Signed)
Patient notified and will contact cardiology 

## 2016-08-15 ENCOUNTER — Encounter: Payer: Self-pay | Admitting: Family Medicine

## 2016-09-18 ENCOUNTER — Other Ambulatory Visit: Payer: Self-pay | Admitting: Family Medicine

## 2016-09-19 NOTE — Telephone Encounter (Signed)
Refilled

## 2016-09-19 NOTE — Telephone Encounter (Signed)
Next OV 10/24/16 last OV 07/25/16 last filled 07/04/16 30 1rf

## 2016-09-19 NOTE — Telephone Encounter (Signed)
faxed

## 2016-09-24 DIAGNOSIS — M545 Low back pain: Secondary | ICD-10-CM | POA: Diagnosis not present

## 2016-09-24 DIAGNOSIS — I499 Cardiac arrhythmia, unspecified: Secondary | ICD-10-CM | POA: Diagnosis not present

## 2016-09-24 DIAGNOSIS — R001 Bradycardia, unspecified: Secondary | ICD-10-CM | POA: Diagnosis not present

## 2016-09-24 DIAGNOSIS — I480 Paroxysmal atrial fibrillation: Secondary | ICD-10-CM | POA: Diagnosis not present

## 2016-09-24 DIAGNOSIS — R5383 Other fatigue: Secondary | ICD-10-CM | POA: Diagnosis not present

## 2016-09-26 ENCOUNTER — Ambulatory Visit
Admission: RE | Admit: 2016-09-26 | Discharge: 2016-09-26 | Disposition: A | Discharge status: Home / Self Care | Payer: Medicare Other | Source: Ambulatory Visit | Attending: Family Medicine | Admitting: Family Medicine

## 2016-09-26 DIAGNOSIS — M810 Age-related osteoporosis without current pathological fracture: Secondary | ICD-10-CM | POA: Insufficient documentation

## 2016-09-26 DIAGNOSIS — Z13820 Encounter for screening for osteoporosis: Secondary | ICD-10-CM | POA: Diagnosis not present

## 2016-10-08 ENCOUNTER — Telehealth: Payer: Self-pay | Admitting: Pain Medicine

## 2016-10-08 ENCOUNTER — Telehealth: Payer: Self-pay | Admitting: *Deleted

## 2016-10-08 NOTE — Telephone Encounter (Signed)
Pt would like to not have RF statates she is not hurting and wants to wait and call back if needed

## 2016-10-08 NOTE — Telephone Encounter (Signed)
Patient lvmail on Friday at 10:44  Has some questions about procedure appt for Wed 10-10-16

## 2016-10-08 NOTE — Telephone Encounter (Signed)
Patient does not want RF would like to cancel appointment and call back if needed

## 2016-10-10 ENCOUNTER — Ambulatory Visit: Payer: Medicare Other | Admitting: Pain Medicine

## 2016-10-22 ENCOUNTER — Other Ambulatory Visit: Payer: Self-pay | Admitting: Family Medicine

## 2016-10-24 ENCOUNTER — Ambulatory Visit: Payer: Medicare Other | Admitting: Family Medicine

## 2016-10-26 ENCOUNTER — Ambulatory Visit (INDEPENDENT_AMBULATORY_CARE_PROVIDER_SITE_OTHER): Payer: Medicare Other | Admitting: Podiatry

## 2016-10-26 ENCOUNTER — Encounter: Payer: Self-pay | Admitting: Podiatry

## 2016-10-26 DIAGNOSIS — M79609 Pain in unspecified limb: Secondary | ICD-10-CM | POA: Diagnosis not present

## 2016-10-26 DIAGNOSIS — L608 Other nail disorders: Secondary | ICD-10-CM

## 2016-10-26 DIAGNOSIS — B351 Tinea unguium: Secondary | ICD-10-CM | POA: Diagnosis not present

## 2016-10-26 DIAGNOSIS — L603 Nail dystrophy: Secondary | ICD-10-CM | POA: Diagnosis not present

## 2016-10-28 NOTE — Progress Notes (Signed)
   SUBJECTIVE Patient  presents to office today complaining of elongated, thickened nails. Pain while ambulating in shoes. Patient is unable to trim their own nails.   OBJECTIVE General Patient is awake, alert, and oriented x 3 and in no acute distress. Derm Skin is dry and supple bilateral. Negative open lesions or macerations. Remaining integument unremarkable. Nails are tender, long, thickened and dystrophic with subungual debris, consistent with onychomycosis, 1-5 bilateral. No signs of infection noted. Vasc  DP and PT pedal pulses palpable bilaterally. Temperature gradient within normal limits.  Neuro Epicritic and protective threshold sensation diminished bilaterally.  Musculoskeletal Exam No symptomatic pedal deformities noted bilateral. Muscular strength within normal limits.  ASSESSMENT 1. Onychodystrophic nails 1-5 bilateral with hyperkeratosis of nails.  2. Onychomycosis of nail due to dermatophyte bilateral 3. Pain in foot bilateral  PLAN OF CARE 1. Patient evaluated today.  2. Instructed to maintain good pedal hygiene and foot care.  3. Mechanical debridement of nails 1-5 bilaterally performed using a nail nipper. Filed with dremel without incident.  4. Return to clinic in 3 mos.    Brent M. Evans, DPM [facility]  Dr. Brent M. Evans, DPM    [address]                                        [place], [place] [address]                Office [phone]  Fax [phone]      

## 2016-11-02 ENCOUNTER — Ambulatory Visit (INDEPENDENT_AMBULATORY_CARE_PROVIDER_SITE_OTHER): Payer: Medicare Other | Admitting: Family Medicine

## 2016-11-02 ENCOUNTER — Encounter: Payer: Self-pay | Admitting: Family Medicine

## 2016-11-02 VITALS — BP 160/70 | HR 60 | Temp 98.4°F | Wt 119.2 lb

## 2016-11-02 DIAGNOSIS — E039 Hypothyroidism, unspecified: Secondary | ICD-10-CM | POA: Diagnosis not present

## 2016-11-02 DIAGNOSIS — I482 Chronic atrial fibrillation, unspecified: Secondary | ICD-10-CM

## 2016-11-02 DIAGNOSIS — I10 Essential (primary) hypertension: Secondary | ICD-10-CM | POA: Diagnosis not present

## 2016-11-02 DIAGNOSIS — R5383 Other fatigue: Secondary | ICD-10-CM | POA: Diagnosis not present

## 2016-11-02 DIAGNOSIS — I5022 Chronic systolic (congestive) heart failure: Secondary | ICD-10-CM

## 2016-11-02 LAB — CBC
HCT: 42.6 % (ref 35.0–45.0)
Hemoglobin: 13.6 g/dL (ref 11.7–15.5)
MCH: 31.3 pg (ref 27.0–33.0)
MCHC: 31.9 g/dL — ABNORMAL LOW (ref 32.0–36.0)
MCV: 97.9 fL (ref 80.0–100.0)
MPV: 10.8 fL (ref 7.5–12.5)
PLATELETS: 229 10*3/uL (ref 140–400)
RBC: 4.35 MIL/uL (ref 3.80–5.10)
RDW: 13.6 % (ref 11.0–15.0)
WBC: 8 10*3/uL (ref 3.8–10.8)

## 2016-11-02 NOTE — Patient Instructions (Signed)
Nice to see you. We're going to obtain lab work and contact with the results. Please continue to monitor your blood pressures. If they start to run higher please let us know. Please monitor your weights as well. If they go up more than 3 pounds in 1 day or 5 pounds in 1 week please contact us or your cardiologist.

## 2016-11-02 NOTE — Assessment & Plan Note (Signed)
Reports weight of 114 pounds yesterday. Ways without clothes typically. Today 119 pounds. Has not taken her Lasix yet. No orthopnea or PND. No shortness of breath. Encouraged her to take her Lasix tonight and continue to monitor her weights. If she develops any symptoms she should be evaluated. Provided with written instructions regarding weight gain.

## 2016-11-02 NOTE — Assessment & Plan Note (Signed)
No bleeding. Heart rate well controlled.

## 2016-11-02 NOTE — Assessment & Plan Note (Signed)
Continues to have issues with this. May be related to age. No exertional symptoms. We'll check lab work. If unremarkable could consider physical therapy.

## 2016-11-02 NOTE — Progress Notes (Signed)
Pre visit review using our clinic review tool, if applicable. No additional management support is needed unless otherwise documented below in the visit note. 

## 2016-11-02 NOTE — Assessment & Plan Note (Signed)
Slightly elevated today though given her age near her goal. She'll monitor at home as it has been well controlled.

## 2016-11-02 NOTE — Assessment & Plan Note (Signed)
Check TSH.  Continue Synthroid. 

## 2016-11-02 NOTE — Progress Notes (Signed)
  Amanda Rumps, MD Phone: [phone]  Amanda Soto is a 83 y.o. female who presents today for follow-up.  Hypertension: Typically 125-130/60-70 at home. No chest pain, shortness breath, or edema. She did go out to eat yesterday and has had some lunch meat this week.  Hypothyroidism: Taking Synthroid. No weight changes, skin changes, or heat or cold intolerance.  A. fib: No palpitations. Currently on Eliquis. Taking sotalol as well. No bleeding.  Patient notes for the past 6 months or so she has just felt weak at times. Notes it is generalized weakness. No focal weakness. Notes she sleeps a lot some days with sleeping up to 12 hours. Though other days sleeps a normal amount. No snoring. No apnea. She notes typically this occurs after she is more active the day before. She notes no exertional symptoms. The next day she will just feel tired. Feels as though she's not getting enough exercise. She does note depression though no SI. She did not start on Wellbutrin. She notes no orthopnea or PND. She does wake well rested.   PMH: nonsmoker.   ROS see history of present illness  Objective  Physical Exam Vitals:   11/02/16 1610  BP: (!) 160/70  Pulse: 60  Temp: 98.4 F (36.9 C)    BP Readings from Last 3 Encounters:  11/02/16 (!) 160/70  08/01/16 (!) 166/50  07/25/16 (!) 148/70   Wt Readings from Last 3 Encounters:  11/02/16 119 lb 3.2 oz (54.1 kg)  08/01/16 115 lb (52.2 kg)  07/25/16 119 lb 3.2 oz (54.1 kg)    Physical Exam  Constitutional: No distress.  Cardiovascular: Normal rate, regular rhythm and normal heart sounds.   Pulmonary/Chest: Effort normal and breath sounds normal.  Musculoskeletal: She exhibits no edema.  Neurological: She is alert. Gait normal.  CN 2-12 intact, 5/5 strength in bilateral biceps, triceps, grip, quads, hamstrings, plantar and dorsiflexion, sensation to light touch intact in bilateral UE and LE, normal gait  Skin: Skin is warm and dry. She is  not diaphoretic.     Assessment/Plan: Please see individual problem list.  Chronic systolic heart failure ([facility]) Reports weight of 114 pounds yesterday. Ways without clothes typically. Today 119 pounds. Has not taken her Lasix yet. No orthopnea or PND. No shortness of breath. Encouraged her to take her Lasix tonight and continue to monitor her weights. If she develops any symptoms she should be evaluated. Provided with written instructions regarding weight gain.  HTN (hypertension) Slightly elevated today though given her age near her goal. She'll monitor at home as it has been well controlled.  Atrial fibrillation ([facility]) No bleeding. Heart rate well controlled.  Tiredness Continues to have issues with this. May be related to age. No exertional symptoms. We'll check lab work. If unremarkable could consider physical therapy.  Hypothyroidism Check TSH. Continue Synthroid.   Orders Placed This Encounter  Procedures  . CBC  . Comp Met (CMET)  . TSH    Amanda Rumps, MD [place]

## 2016-11-03 LAB — COMPREHENSIVE METABOLIC PANEL
ALK PHOS: 56 U/L (ref 33–130)
ALT: 18 U/L (ref 6–29)
AST: 28 U/L (ref 10–35)
Albumin: 3.9 g/dL (ref 3.6–5.1)
BILIRUBIN TOTAL: 0.7 mg/dL (ref 0.2–1.2)
BUN: 14 mg/dL (ref 7–25)
CO2: 26 mmol/L (ref 20–31)
Calcium: 9.3 mg/dL (ref 8.6–10.4)
Chloride: 103 mmol/L (ref 98–110)
Creat: 0.67 mg/dL (ref 0.60–0.88)
Glucose, Bld: 98 mg/dL (ref 65–99)
POTASSIUM: 4.2 mmol/L (ref 3.5–5.3)
SODIUM: 139 mmol/L (ref 135–146)
TOTAL PROTEIN: 6.8 g/dL (ref 6.1–8.1)

## 2016-11-03 LAB — TSH: TSH: 0.59 m[IU]/L

## 2016-11-13 DIAGNOSIS — H353132 Nonexudative age-related macular degeneration, bilateral, intermediate dry stage: Secondary | ICD-10-CM | POA: Diagnosis not present

## 2016-11-14 ENCOUNTER — Telehealth: Payer: Self-pay | Admitting: Pain Medicine

## 2016-11-14 NOTE — Telephone Encounter (Signed)
RF  Received: 1 month ago  Message Contents  Dawkins, Tish Frederickson, NT  Blanton Kardell, [place], [place]  Cc: Magda Paganini, NT        PT CANCEL HER RF. PT STATED THAT SHE WAS TOLD BY CINDY TO WAIT UNTIL SHE STARTS TO HURTING THEN CALL INTO SCHEDULE FOR HER RF. I EXPLAINED TO THE PT THAT WHEN SHE CALL SHE MAY HAVE TO WAIT A FEW WEEKS DEPENDING ON THE OPENINGS.    HERE'S THE INFO FOR APPROVAL AND FOR HER TO STOP MEDS:  RFA Right sided lumbar facet  PT IS AWARE TO STOP BLOOD THINNER 3 DAYS PRIOR TO 10/10/16   NO PRIOR AUTH REQUIRED...TD    This is a message sent one month ago. Patient called and cancelled RF. No prior Josem Kaufmann is required per TD so she can go ahead and be scheduled on the next available RF appointment slot. We are going by the schedule now, so it may be a while before she can get in.

## 2016-11-14 NOTE — Telephone Encounter (Signed)
Patient calling to check on RF appt. She has to stop meds 3 days before, please call her with appt.

## 2016-11-21 ENCOUNTER — Other Ambulatory Visit: Payer: Self-pay | Admitting: Family Medicine

## 2016-11-21 NOTE — Telephone Encounter (Signed)
faxed

## 2016-11-21 NOTE — Telephone Encounter (Signed)
Last OV 11/02/16 last filled 09/19/16 30 1rf

## 2016-11-22 ENCOUNTER — Other Ambulatory Visit: Payer: Self-pay | Admitting: Family Medicine

## 2016-12-17 ENCOUNTER — Encounter: Payer: Self-pay | Admitting: Pain Medicine

## 2016-12-17 ENCOUNTER — Ambulatory Visit (HOSPITAL_BASED_OUTPATIENT_CLINIC_OR_DEPARTMENT_OTHER): Payer: Medicare Other | Admitting: Pain Medicine

## 2016-12-17 ENCOUNTER — Ambulatory Visit
Admission: RE | Admit: 2016-12-17 | Discharge: 2016-12-17 | Disposition: A | Discharge status: Home / Self Care | Payer: Medicare Other | Source: Ambulatory Visit | Attending: Pain Medicine | Admitting: Pain Medicine

## 2016-12-17 VITALS — BP 159/48 | HR 53 | Temp 98.4°F | Resp 19 | Ht 59.5 in | Wt 114.0 lb

## 2016-12-17 DIAGNOSIS — Z881 Allergy status to other antibiotic agents status: Secondary | ICD-10-CM | POA: Diagnosis not present

## 2016-12-17 DIAGNOSIS — M545 Low back pain, unspecified: Secondary | ICD-10-CM

## 2016-12-17 DIAGNOSIS — M4696 Unspecified inflammatory spondylopathy, lumbar region: Secondary | ICD-10-CM

## 2016-12-17 DIAGNOSIS — G8918 Other acute postprocedural pain: Secondary | ICD-10-CM

## 2016-12-17 DIAGNOSIS — M1288 Other specific arthropathies, not elsewhere classified, other specified site: Secondary | ICD-10-CM | POA: Diagnosis not present

## 2016-12-17 DIAGNOSIS — S52539A Colles' fracture of unspecified radius, initial encounter for closed fracture: Secondary | ICD-10-CM | POA: Insufficient documentation

## 2016-12-17 DIAGNOSIS — G8929 Other chronic pain: Secondary | ICD-10-CM

## 2016-12-17 DIAGNOSIS — G894 Chronic pain syndrome: Secondary | ICD-10-CM

## 2016-12-17 DIAGNOSIS — M47816 Spondylosis without myelopathy or radiculopathy, lumbar region: Secondary | ICD-10-CM | POA: Diagnosis not present

## 2016-12-17 HISTORY — DX: Colles' fracture of unspecified radius, initial encounter for closed fracture: S52.539A

## 2016-12-17 HISTORY — DX: Other acute postprocedural pain: G89.18

## 2016-12-17 MED ORDER — MIDAZOLAM HCL 5 MG/5ML IJ SOLN
INTRAMUSCULAR | Status: AC
  Filled 2016-12-17: qty 5

## 2016-12-17 MED ORDER — ROPIVACAINE HCL 2 MG/ML IJ SOLN
INTRAMUSCULAR | Status: AC
  Filled 2016-12-17: qty 10

## 2016-12-17 MED ORDER — FENTANYL CITRATE (PF) 100 MCG/2ML IJ SOLN
25.0000 ug | INTRAMUSCULAR | Status: DC | PRN
  Administered 2016-12-17: 25 ug via INTRAVENOUS

## 2016-12-17 MED ORDER — TRIAMCINOLONE ACETONIDE 40 MG/ML IJ SUSP
INTRAMUSCULAR | Status: AC
  Filled 2016-12-17: qty 1

## 2016-12-17 MED ORDER — TRAMADOL HCL 50 MG PO TABS: 100.0000 mg | ORAL_TABLET | Freq: Four times a day (QID) | ORAL | 1 refills | Status: DC | PRN

## 2016-12-17 MED ORDER — TRIAMCINOLONE ACETONIDE 40 MG/ML IJ SUSP
40.0000 mg | Freq: Once | INTRAMUSCULAR | Status: AC
  Administered 2016-12-17: 40 mg

## 2016-12-17 MED ORDER — FENTANYL CITRATE (PF) 100 MCG/2ML IJ SOLN
INTRAMUSCULAR | Status: AC
  Filled 2016-12-17: qty 2

## 2016-12-17 MED ORDER — MIDAZOLAM HCL 5 MG/5ML IJ SOLN
1.0000 mg | INTRAMUSCULAR | Status: DC | PRN
  Administered 2016-12-17: 1 mg via INTRAVENOUS

## 2016-12-17 MED ORDER — ROPIVACAINE HCL 2 MG/ML IJ SOLN
9.0000 mL | Freq: Once | INTRAMUSCULAR | Status: AC
  Administered 2016-12-17: 9 mL via PERINEURAL

## 2016-12-17 MED ORDER — LACTATED RINGERS IV SOLN
1000.0000 mL | Freq: Once | INTRAVENOUS | Status: AC
  Administered 2016-12-17: 1000 mL via INTRAVENOUS

## 2016-12-17 MED ORDER — LIDOCAINE HCL (PF) 1.5 % IJ SOLN
20.0000 mL | Freq: Once | INTRAMUSCULAR | Status: AC
  Administered 2016-12-17: 20 mL
  Filled 2016-12-17: qty 20

## 2016-12-17 NOTE — Progress Notes (Signed)
Patient's Name: Amanda Soto  MRN: [id number]  Referring Provider: [name], MD  DOB: 1925/04/02  PCP: [name], MD  DOS: 12/17/2016  Note by: Kathlen Brunswick. Dossie Arbour, MD  Service setting: Ambulatory outpatient  Location: [facility] (AMB) Pain Management Facility  Visit type: Procedure  Specialty: Interventional Pain Management  Patient type: Established   Primary Reason for Visit: Interventional Pain Management Treatment. CC: Back Pain (lower- right)  Procedure:  Anesthesia, Analgesia, Anxiolysis:  Type: Therapeutic Medial Branch Facet Radiofrequency Ablation Region: Lumbar Level: L2, L3, L4, L5, & S1 Medial Branch Level(s) Laterality: Right-Sided  Type: Local Anesthesia with Moderate (Conscious) Sedation Local Anesthetic: Lidocaine 1% Route: Intravenous (IV) IV Access: Secured Sedation: Meaningful verbal contact was maintained at all times during the procedure  Indication(s): Analgesia and Anxiety  Indications: 1. Lumbar facet syndrome (Location of Primary Source of Pain) (Right)   2. Lumbar facet arthropathy (HCC)   3. Lumbar spondylosis   4. Chronic low back pain (Location of Primary Source of Pain) (Right)   5. Chronic pain syndrome   6. Acute postoperative pain    Amanda Soto has either failed to respond, was unable to tolerate, or simply did not get enough benefit from other more conservative therapies including, but not limited to: 1. Over-the-counter medications 2. Anti-inflammatory medications 3. Muscle relaxants 4. Membrane stabilizers 5. Opioids 6. Physical therapy 7. Modalities (Heat, ice, etc.) 8. Invasive techniques such as nerve blocks. Amanda Soto has attained more than 50% relief of the pain from a series of diagnostic injections conducted in separate occasions.  Pain Score: Pre-procedure: 6 /10 Post-procedure: 0-No pain/10  Pre-op Assessment:  Previous date of service: 08/01/16 Service provided: Evaluation Amanda Soto is a 83 y.o. (year old), female  patient, seen today for interventional treatment. She  has a past surgical history that includes Cataract extraction; Partial hysterectomy; Appendectomy; and Tonsillectomy. Her primarily concern today is the Back Pain (lower- right)  Initial Vital Signs: Blood pressure (!) 167/46, pulse (!) 53, temperature 98.4 F (36.9 C), resp. rate 16, height 4' 11.5" (1.511 m), weight 114 lb (51.7 kg), SpO2 98 %. BMI: 22.64 kg/m  Risk Assessment: Allergies: Reviewed. She is allergic to hydrocodone; ciprofloxacin; and latex.  Allergy Precautions: None required Coagulopathies: Reviewed. None identified.  Blood-thinner therapy: None at this time Active Infection(s): Reviewed. None identified. Amanda Soto is afebrile  Site Confirmation: Amanda Soto was asked to confirm the procedure and laterality before marking the site Procedure checklist: Completed Consent: Before the procedure and under the influence of no sedative(s), amnesic(s), or anxiolytics, the patient was informed of the treatment options, risks and possible complications. To fulfill our ethical and legal obligations, as recommended by the American Medical Association's Code of Ethics, I have informed the patient of my clinical impression; the nature and purpose of the treatment or procedure; the risks, benefits, and possible complications of the intervention; the alternatives, including doing nothing; the risk(s) and benefit(s) of the alternative treatment(s) or procedure(s); and the risk(s) and benefit(s) of doing nothing. The patient was provided information about the general risks and possible complications associated with the procedure. These may include, but are not limited to: failure to achieve desired goals, infection, bleeding, organ or nerve damage, allergic reactions, paralysis, and death. In addition, the patient was informed of those risks and complications associated to Spine-related procedures, such as failure to decrease pain; infection  (i.e.: Meningitis, epidural or intraspinal abscess); bleeding (i.e.: epidural hematoma, subarachnoid hemorrhage, or any other type of intraspinal or  peri-dural bleeding); organ or nerve damage (i.e.: Any type of peripheral nerve, nerve root, or spinal cord injury) with subsequent damage to sensory, motor, and/or autonomic systems, resulting in permanent pain, numbness, and/or weakness of one or several areas of the body; allergic reactions; (i.e.: anaphylactic reaction); and/or death. Furthermore, the patient was informed of those risks and complications associated with the medications. These include, but are not limited to: allergic reactions (i.e.: anaphylactic or anaphylactoid reaction(s)); adrenal axis suppression; blood sugar elevation that in diabetics may result in ketoacidosis or comma; water retention that in patients with history of congestive heart failure may result in shortness of breath, pulmonary edema, and decompensation with resultant heart failure; weight gain; swelling or edema; medication-induced neural toxicity; particulate matter embolism and blood vessel occlusion with resultant organ, and/or nervous system infarction; and/or aseptic necrosis of one or more joints. Finally, the patient was informed that Medicine is not an exact science; therefore, there is also the possibility of unforeseen or unpredictable risks and/or possible complications that may result in a catastrophic outcome. The patient indicated having understood very clearly. We have given the patient no guarantees and we have made no promises. Enough time was given to the patient to ask questions, all of which were answered to the patient's satisfaction. Amanda Soto has indicated that she wanted to continue with the procedure. Attestation: I, the ordering provider, attest that I have discussed with the patient the benefits, risks, side-effects, alternatives, likelihood of achieving goals, and potential problems during recovery  for the procedure that I have provided informed consent. Date: 12/17/2016; Time: 1:35 PM  Pre-Procedure Preparation:  Monitoring: As per clinic protocol. Respiration, ETCO2, SpO2, BP, heart rate and rhythm monitor placed and checked for adequate function Safety Precautions: Patient was assessed for positional comfort and pressure points before starting the procedure. Time-out: I initiated and conducted the "Time-out" before starting the procedure, as per protocol. The patient was asked to participate by confirming the accuracy of the "Time Out" information. Verification of the correct person, site, and procedure were performed and confirmed by me, the nursing staff, and the patient. "Time-out" conducted as per Joint Commission's Universal Protocol (UP.01.01.01). "Time-out" Date & Time: 12/17/2016; 1416 hrs.  Description of Procedure Process:   Position: Prone Target Area: For Lumbar Facet blocks, the target is the groove formed by the junction of the transverse process and superior articular process. For the L5 dorsal ramus, the target is the notch between superior articular process and sacral ala. For the S1 dorsal ramus, the target is the superior and lateral edge of the posterior S1 Sacral foramen. Approach: Paraspinal approach. Area Prepped: Entire Posterior Lumbosacral Region Prepping solution: Hibiclens (4.0% Chlorhexidine gluconate solution) Safety Precautions: Aspiration looking for blood return was conducted prior to all injections. At no point did we inject any substances, as a needle was being advanced. No attempts were made at seeking any paresthesias. Safe injection practices and needle disposal techniques used. Medications properly checked for expiration dates. SDV (single dose vial) medications used. Description of the Procedure: Protocol guidelines were followed. The patient was placed in position over the fluoroscopy table. The target area was identified and the area prepped in the  usual manner. Skin desensitized using vapocoolant spray. Skin & deeper tissues infiltrated with local anesthetic. Appropriate amount of time allowed to pass for local anesthetics to take effect. Radiofrequency needles were introduced to the area of the medial branch at the junction of the superior articular process and transverse process using fluoroscopy. Using the  Dealer, sensory stimulation using 50 Hz was used to locate & identify the nerve, making sure that the needle was positioned such that there was no sensory stimulation below 0.3 V or above 0.7 V. Stimulation using 2 Hz was used to evaluate the motor component. Care was taken not to lesion any nerves that demonstrated motor stimulation of the lower extremities at an output of less than 2.5 times that of the sensory threshold, or a maximum of 2.0 V. Once satisfactory placement of the needles was achieved, the above solution was slowly injected after negative aspiration. After waiting for at least 2 minutes, the ablation was performed at 80 degrees C for 60 seconds.The needles were then removed and the area cleansed, making sure to leave some of the prepping solution back to take advantage of its long term bactericidal properties. Vitals:   12/17/16 1457 12/17/16 1507 12/17/16 1517 12/17/16 1522  BP: (!) 146/50 (!) 148/46 (!) 154/60 (!) 159/48  Pulse:      Resp: 16 (!) 24 18 19   Temp:      SpO2: 98% 96% 96% 98%  Weight:      Height:        Start Time: 1416 hrs. End Time: 1447 hrs. Materials & Medications:  Needle(s) Type: Teflon-coated, curved tip, Radiofrequency needle(s) Gauge: 22G Length: 10cm Medication(s): We administered lactated ringers, midazolam, fentaNYL, lidocaine, triamcinolone acetonide, and ropivacaine (PF) 2 mg/mL (0.2%). Please see chart orders for dosing details.  Imaging Guidance (Spinal):  Type of Imaging Technique: Fluoroscopy Guidance (Spinal) Indication(s): Assistance in needle guidance and  placement for procedures requiring needle placement in or near specific anatomical locations not easily accessible without such assistance. Exposure Time: Please see nurses notes. Contrast: None used. Fluoroscopic Guidance: I was personally present during the use of fluoroscopy. "Tunnel Vision Technique" used to obtain the best possible view of the target area. Parallax error corrected before commencing the procedure. "Direction-depth-direction" technique used to introduce the needle under continuous pulsed fluoroscopy. Once target was reached, antero-posterior, oblique, and lateral fluoroscopic projection used confirm needle placement in all planes. Images permanently stored in EMR. Interpretation: No contrast injected. I personally interpreted the imaging intraoperatively. Adequate needle placement confirmed in multiple planes. Permanent images saved into the patient's record.  Antibiotic Prophylaxis:  Indication(s): None identified Antibiotic given: None  Post-operative Assessment:  EBL: None Complications: No immediate post-treatment complications observed by team, or reported by patient. Note: The patient tolerated the entire procedure well. A repeat set of vitals were taken after the procedure and the patient was kept under observation following institutional policy, for this type of procedure. Post-procedural neurological assessment was performed, showing return to baseline, prior to discharge. The patient was provided with post-procedure discharge instructions, including a section on how to identify potential problems. Should any problems arise concerning this procedure, the patient was given instructions to immediately contact us, at any time, without hesitation. In any case, we plan to contact the patient by telephone for a follow-up status report regarding this interventional procedure. Comments:  No additional relevant information.  Plan of Care  Disposition: Discharge home  Discharge  Date & Time: 12/17/2016; 1526 hrs.  Physician-requested Follow-up:  Return in about 6 weeks (around 01/28/2017) for post-RF eval, (6 wks), by MD.  Future Appointments Date Time Provider Silt  01/29/2017 11:00 AM Milinda Pointer, MD [facility] None  02/11/2017 2:00 PM [name], MD [facility] None  07/26/2017 3:00 PM O'Brien-Blaney, Denisa L, LPN [facility] None   Medications ordered  for procedure: Meds ordered this encounter  Medications  . lactated ringers infusion 1,000 mL  . midazolam (VERSED) 5 MG/5ML injection 1-2 mg    Make sure Flumazenil is available in the pyxis when using this medication. If oversedation occurs, administer 0.2 mg IV over 15 sec. If after 45 sec no response, administer 0.2 mg again over 1 min; may repeat at 1 min intervals; not to exceed 4 doses (1 mg)  . fentaNYL (SUBLIMAZE) injection 25-50 mcg    Make sure Narcan is available in the pyxis when using this medication. In the event of respiratory depression (RR< 8/min): Titrate NARCAN (naloxone) in increments of 0.1 to 0.2 mg IV at 2-3 minute intervals, until desired degree of reversal.  . lidocaine 1.5 % injection 20 mL  . triamcinolone acetonide (KENALOG-40) injection 40 mg  . ropivacaine (PF) 2 mg/mL (0.2%) (NAROPIN) injection 9 mL  . traMADol (ULTRAM) 50 MG tablet    Sig: Take 2 tablets (100 mg total) by mouth every 6 (six) hours as needed for severe pain.    Dispense:  240 tablet    Refill:  1    Do not place this medication, or any other prescription from our practice, on "Automatic Refill". Patient may have prescription filled one day early if pharmacy is closed on scheduled refill date. Do not fill until: 12/17/16 To last until: 02/15/17   Medications administered: We administered lactated ringers, midazolam, fentaNYL, lidocaine, triamcinolone acetonide, and ropivacaine (PF) 2 mg/mL (0.2%).  See the medical record for exact dosing, route, and time of administration.  Lab-work,  Procedure(s), & Referral(s) Ordered: Orders Placed This Encounter  Procedures  . Radiofrequency,Lumbar  . DG C-Arm 1-60 Min-No Report  . Informed Consent Details: Transcribe to consent form and obtain patient signature  . Provider attestation of informed consent for procedure/surgical case  . Verify informed consent  . Discharge instructions  . Follow-up   Imaging Ordered: Results for orders placed in visit on 06/13/16  DG C-Arm 1-60 Min-No Report   Narrative There is no Radiologist interpretation  for this exam.   New Prescriptions   TRAMADOL (ULTRAM) 50 MG TABLET    Take 2 tablets (100 mg total) by mouth every 6 (six) hours as needed for severe pain.   Primary Care Physician: [name], MD Location: [facility] Note by: Kathlen Brunswick. Dossie Arbour, M.D, DABA, DABAPM, DABPM, DABIPP, FIPP Date: 12/17/2016; Time: 3:46 PM  Disclaimer:  Medicine is not an exact science. The only guarantee in medicine is that nothing is guaranteed. It is important to note that the decision to proceed with this intervention was based on the information collected from the patient. The Data and conclusions were drawn from the patient's questionnaire, the interview, and the physical examination. Because the information was provided in large part by the patient, it cannot be guaranteed that it has not been purposely or unconsciously manipulated. Every effort has been made to obtain as much relevant data as possible for this evaluation. It is important to note that the conclusions that lead to this procedure are derived in large part from the available data. Always take into account that the treatment will also be dependent on availability of resources and existing treatment guidelines, considered by other Pain Management Practitioners as being common knowledge and practice, at the time of the intervention. For Medico-Legal purposes, it is also important to point out that variation in  procedural techniques and pharmacological choices are the acceptable norm. The indications, contraindications,  technique, and results of the above procedure should only be interpreted and judged by a Board-Certified Interventional Pain Specialist with extensive familiarity and expertise in the same exact procedure and technique.  Instructions provided at this appointment: Patient Instructions   ____________________________________________________________________________________________  Post-Procedure instructions Instructions:  Apply ice: Fill a plastic sandwich bag with crushed ice. Cover it with a small towel and apply to injection site. Apply for 15 minutes then remove x 15 minutes. Repeat sequence on day of procedure, until you go to bed. The purpose is to minimize swelling and discomfort after procedure.  Apply heat: Apply heat to procedure site starting the day following the procedure. The purpose is to treat any soreness and discomfort from the procedure.  Food intake: Start with clear liquids (like water) and advance to regular food, as tolerated.   Physical activities: Keep activities to a minimum for the first 8 hours after the procedure.   Driving: If you have received any sedation, you are not allowed to drive for 24 hours after your procedure.  Blood thinner: Restart your blood thinner 6 hours after your procedure. (Only for those taking blood thinners)  Insulin: As soon as you can eat, you may resume your normal dosing schedule. (Only for those taking insulin)  Infection prevention: Keep procedure site clean and dry.  Post-procedure Pain Diary: Extremely important that this be done correctly and accurately. Recorded information will be used to determine the next step in treatment.  Pain evaluated is that of treated area only. Do not include pain from an untreated area.  Complete every hour, on the hour, for the initial 8 hours. Set an alarm to help you do this part  accurately.  Do not go to sleep and have it completed later. It will not be accurate.  Follow-up appointment: Keep your follow-up appointment after the procedure. Usually 2 weeks for most procedures. (6 weeks in the case of radiofrequency.) Bring you pain diary.  Expect:  From numbing medicine (AKA: Local Anesthetics): Numbness or decrease in pain.  Onset: Full effect within 15 minutes of injected.  Duration: It will depend on the type of local anesthetic used. On the average, 1 to 8 hours.   From steroids: Decrease in swelling or inflammation. Once inflammation is improved, relief of the pain will follow.  Onset of benefits: Depends on the amount of swelling present. The more swelling, the longer it will take for the benefits to be seen. In some cases, up to 10 days.  Duration: Steroids will stay in the system x 2 weeks. Duration of benefits will depend on multiple posibilities including persistent irritating factors.  From procedure: Some discomfort is to be expected once the numbing medicine wears off. This should be minimal if ice and heat are applied as instructed. Call if:  You experience numbness and weakness that gets worse with time, as opposed to wearing off.  New onset bowel or bladder incontinence. (Spinal procedures only)  Emergency Numbers:  Durning business hours (Monday - Thursday, 8:00 AM - 4:00 PM) (Friday, 9:00 AM - 12:00 Noon): [phone]  After hours: [phone] ____________________________________________________________________________________________  Pain Management Discharge Instructions  General Discharge Instructions :  If you need to reach your doctor call: Monday-Friday 8:00 am - 4:00 pm at [phone] or toll free [phone].  After clinic hours [phone] to have operator reach doctor.  Bring all of your medication bottles to all your appointments in the pain clinic.  To cancel or reschedule your appointment with Pain Management  please remember to call 24 hours in advance to avoid a fee.  Refer to the educational materials which you have been given on: General Risks, I had my Procedure. Discharge Instructions, Post Sedation.  Post Procedure Instructions:  The drugs you were given will stay in your system until tomorrow, so for the next 24 hours you should not drive, make any legal decisions or drink any alcoholic beverages.  You may eat anything you prefer, but it is better to start with liquids then soups and crackers, and gradually work up to solid foods.  Please notify your doctor immediately if you have any unusual bleeding, trouble breathing or pain that is not related to your normal pain.  Depending on the type of procedure that was done, some parts of your body may feel week and/or numb.  This usually clears up by tonight or the next day.  Walk with the use of an assistive device or accompanied by an adult for the 24 hours.  You may use ice on the affected area for the first 24 hours.  Put ice in a Ziploc bag and cover with a towel and place against area 15 minutes on 15 minutes off.  You may switch to heat after 24 hours.Radiofrequency Lesioning Radiofrequency lesioning is a procedure that is performed to relieve pain. The procedure is often used for back, neck, or arm pain. Radiofrequency lesioning involves the use of a machine that creates radio waves to make heat. During the procedure, the heat is applied to the nerve that carries the pain signal. The heat damages the nerve and interferes with the pain signal. Pain relief usually starts about 2 weeks after the procedure and lasts for 6 months to 1 year. Tell a health care provider about: Any allergies you have. All medicines you are taking, including vitamins, herbs, eye drops, creams, and over-the-counter medicines. Any problems you or family members have had with anesthetic medicines. Any blood disorders you have. Any surgeries you have had. Any medical  conditions you have. Whether you are pregnant or may be pregnant. What are the risks? Generally, this is a safe procedure. However, problems may occur, including: Pain or soreness at the injection site. Infection at the injection site. Damage to nerves or blood vessels.  What happens before the procedure? Ask your health care provider about: Changing or stopping your regular medicines. This is especially important if you are taking diabetes medicines or blood thinners. Taking medicines such as aspirin and ibuprofen. These medicines can thin your blood. Do not take these medicines before your procedure if your health care provider instructs you not to. Follow instructions from your health care provider about eating or drinking restrictions. Plan to have someone take you home after the procedure. If you go home right after the procedure, plan to have someone with you for 24 hours. What happens during the procedure? You will be given one or more of the following: A medicine to help you relax (sedative). A medicine to numb the area (local anesthetic). You will be awake during the procedure. You will need to be able to talk with the health care provider during the procedure. With the help of a type of X-ray (fluoroscopy), the health care provider will insert a radiofrequency needle into the area to be treated. Next, a wire that carries the radio waves (electrode) will be put through the radiofrequency needle. An electrical pulse will be sent through the electrode to verify the correct nerve. You will feel a tingling  sensation, and you may have muscle twitching. Then, the tissue that is around the needle tip will be heated by an electric current that is passed using the radiofrequency machine. This will numb the nerves. A bandage (dressing) will be put on the insertion area after the procedure is done. The procedure may vary among health care providers and hospitals. What happens after the  procedure? Your blood pressure, heart rate, breathing rate, and blood oxygen level will be monitored often until the medicines you were given have worn off. Return to your normal activities as directed by your health care provider. This information is not intended to replace advice given to you by your health care provider. Make sure you discuss any questions you have with your health care provider. Document Released: 02/14/2011 Document Revised: 11/24/2015 Document Reviewed: 07/26/2014 Elsevier Interactive Patient Education  2018 [employer] Radiofrequency Lesioning, Care After Refer to this sheet in the next few weeks. These instructions provide you with information about caring for yourself after your procedure. Your health care provider may also give you more specific instructions. Your treatment has been planned according to current medical practices, but problems sometimes occur. Call your health care provider if you have any problems or questions after your procedure. What can I expect after the procedure? After the procedure, it is common to have: Pain from the burned nerve. Temporary numbness.  Follow these instructions at home: Take over-the-counter and prescription medicines only as told by your health care provider. Return to your normal activities as told by your health care provider. Ask your health care provider what activities are safe for you. Pay close attention to how you feel after the procedure. If you start to have pain, write down when it hurts and how it feels. This will help you and your health care provider to know if you need an additional treatment. Check your needle insertion site every day for signs of infection. Watch for: Redness, swelling, or pain. Fluid, blood, or pus. Keep all follow-up visits as told by your health care provider. This is important. Contact a health care provider if: Your pain does not get better. You have redness, swelling, or pain at the  needle insertion site. You have fluid, blood, or pus coming from the needle insertion site. You have a fever. Get help right away if: You develop sudden, severe pain. You develop numbness or tingling near the procedure site that does not go away. This information is not intended to replace advice given to you by your health care provider. Make sure you discuss any questions you have with your health care provider. Document Released: 02/15/2011 Document Revised: 11/24/2015 Document Reviewed: 07/26/2014 Elsevier Interactive Patient Education  2018 [place]. Pain Management Discharge Instructions  General Discharge Instructions :  If you need to reach your doctor call: Monday-Friday 8:00 am - 4:00 pm at [phone] or toll free [phone].  After clinic hours [phone] to have operator reach doctor.  Bring all of your medication bottles to all your appointments in the pain clinic.  To cancel or reschedule your appointment with Pain Management please remember to call 24 hours in advance to avoid a fee.  Refer to the educational materials which you have been given on: General Risks, I had my Procedure. Discharge Instructions, Post Sedation.  Post Procedure Instructions:  The drugs you were given will stay in your system until tomorrow, so for the next 24 hours you should not drive, make any legal decisions or drink any alcoholic beverages.  You may eat anything you prefer, but it is better to start with liquids then soups and crackers, and gradually work up to solid foods.  Please notify your doctor immediately if you have any unusual bleeding, trouble breathing or pain that is not related to your normal pain.  Depending on the type of procedure that was done, some parts of your body may feel week and/or numb.  This usually clears up by tonight or the next day.  Walk with the use of an assistive device or accompanied by an adult for the 24 hours.  You may use ice on the  affected area for the first 24 hours.  Put ice in a Ziploc bag and cover with a towel and place against area 15 minutes on 15 minutes off.  You may switch to heat after 24 hours.GENERAL RISKS AND COMPLICATIONS  What are the risk, side effects and possible complications? Generally speaking, most procedures are safe.  However, with any procedure there are risks, side effects, and the possibility of complications.  The risks and complications are dependent upon the sites that are lesioned, or the type of nerve block to be performed.  The closer the procedure is to the spine, the more serious the risks are.  Great care is taken when placing the radio frequency needles, block needles or lesioning probes, but sometimes complications can occur. Infection: Any time there is an injection through the skin, there is a risk of infection.  This is why sterile conditions are used for these blocks.  There are four possible types of infection. Localized skin infection. Central Nervous System Infection-This can be in the form of Meningitis, which can be deadly. Epidural Infections-This can be in the form of an epidural abscess, which can cause pressure inside of the spine, causing compression of the spinal cord with subsequent paralysis. This would require an emergency surgery to decompress, and there are no guarantees that the patient would recover from the paralysis. Discitis-This is an infection of the intervertebral discs.  It occurs in about 1% of discography procedures.  It is difficult to treat and it may lead to surgery.        2. Pain: the needles have to go through skin and soft tissues, will cause soreness.       3. Damage to internal structures:  The nerves to be lesioned may be near blood vessels or    other nerves which can be potentially damaged.       4. Bleeding: Bleeding is more common if the patient is taking blood thinners such as  aspirin, Coumadin, Ticiid, Plavix, etc., or if he/she have some  genetic predisposition  such as hemophilia. Bleeding into the spinal canal can cause compression of the spinal  cord with subsequent paralysis.  This would require an emergency surgery to  decompress and there are no guarantees that the patient would recover from the  paralysis.       5. Pneumothorax:  Puncturing of a lung is a possibility, every time a needle is introduced in  the area of the chest or upper back.  Pneumothorax refers to free air around the  collapsed lung(s), inside of the thoracic cavity (chest cavity).  Another two possible  complications related to a similar event would include: Hemothorax and Chylothorax.   These are variations of the Pneumothorax, where instead of air around the collapsed  lung(s), you may have blood or chyle, respectively.       6. Spinal headaches:  They may occur with any procedures in the area of the spine.       7. Persistent CSF (Cerebro-Spinal Fluid) leakage: This is a rare problem, but may occur  with prolonged intrathecal or epidural catheters either due to the formation of a fistulous  track or a dural tear.       8. Nerve damage: By working so close to the spinal cord, there is always a possibility of  nerve damage, which could be as serious as a permanent spinal cord injury with  paralysis.       9. Death:  Although rare, severe deadly allergic reactions known as "Anaphylactic  reaction" can occur to any of the medications used.      10. Worsening of the symptoms:  We can always make thing worse.  What are the chances of something like this happening? Chances of any of this occuring are extremely low.  By statistics, you have more of a chance of getting killed in a motor vehicle accident: while driving to the hospital than any of the above occurring .  Nevertheless, you should be aware that they are possibilities.  In general, it is similar to taking a shower.  Everybody knows that you can slip, hit your head and get killed.  Does that mean that you should  not shower again?  Nevertheless always keep in mind that statistics do not mean anything if you happen to be on the wrong side of them.  Even if a procedure has a 1 (one) in a 1,000,000 (million) chance of going wrong, it you happen to be that one..Also, keep in mind that by statistics, you have more of a chance of having something go wrong when taking medications.  Who should not have this procedure? If you are on a blood thinning medication (e.g. Coumadin, Plavix, see list of "Blood Thinners"), or if you have an active infection going on, you should not have the procedure.  If you are taking any blood thinners, please inform your physician.  How should I prepare for this procedure? Do not eat or drink anything at least six hours prior to the procedure. Bring a driver with you .  It cannot be a taxi. Come accompanied by an adult that can drive you back, and that is strong enough to help you if your legs get weak or numb from the local anesthetic. Take all of your medicines the morning of the procedure with just enough water to swallow them. If you have diabetes, make sure that you are scheduled to have your procedure done first thing in the morning, whenever possible. If you have diabetes, take only half of your insulin dose and notify our nurse that you have done so as soon as you arrive at the clinic. If you are diabetic, but only take blood sugar pills (oral hypoglycemic), then do not take them on the morning of your procedure.  You may take them after you have had the procedure. Do not take aspirin or any aspirin-containing medications, at least eleven (11) days prior to the procedure.  They may prolong bleeding. Wear loose fitting clothing that may be easy to take off and that you would not mind if it got stained with Betadine or blood. Do not wear any jewelry or perfume Remove any nail coloring.  It will interfere with some of our monitoring equipment.  NOTE: Remember that this is not meant  to be interpreted as a complete list of all possible complications.  Unforeseen problems may occur.  BLOOD THINNERS The following drugs contain aspirin or other products, which can cause increased bleeding during surgery and should not be taken for 2 weeks prior to and 1 week after surgery.  If you should need take something for relief of minor pain, you may take acetaminophen which is found in Tylenol,m Datril, Anacin-3 and Panadol. It is not blood thinner. The products listed below are.  Do not take any of the products listed below in addition to any listed on your instruction sheet.  A.P.C or A.P.C with Codeine Codeine Phosphate Capsules #3 Ibuprofen Ridaura  ABC compound Congesprin Imuran rimadil  Advil Cope Indocin Robaxisal  Alka-Seltzer Effervescent Pain Reliever and Antacid Coricidin or Coricidin-D  Indomethacin Rufen  Alka-Seltzer plus Cold Medicine Cosprin Ketoprofen S-A-C Tablets  Anacin Analgesic Tablets or Capsules Coumadin Korlgesic Salflex  Anacin Extra Strength Analgesic tablets or capsules CP-2 Tablets Lanoril Salicylate  Anaprox Cuprimine Capsules Levenox Salocol  Anexsia-D Dalteparin Magan Salsalate  Anodynos Darvon compound Magnesium Salicylate Sine-off  Ansaid Dasin Capsules Magsal Sodium Salicylate  Anturane Depen Capsules Marnal Soma  APF Arthritis pain formula Dewitt's Pills Measurin Stanback  Argesic Dia-Gesic Meclofenamic Sulfinpyrazone  Arthritis Bayer Timed Release Aspirin Diclofenac Meclomen Sulindac  Arthritis pain formula Anacin Dicumarol Medipren Supac  Analgesic (Safety coated) Arthralgen Diffunasal Mefanamic Suprofen  Arthritis Strength Bufferin Dihydrocodeine Mepro Compound Suprol  Arthropan liquid Dopirydamole Methcarbomol with Aspirin Synalgos  ASA tablets/Enseals Disalcid Micrainin Tagament  Ascriptin Doan's Midol Talwin  Ascriptin A/D Dolene Mobidin Tanderil  Ascriptin Extra Strength Dolobid Moblgesic Ticlid  Ascriptin with Codeine Doloprin or  Doloprin with Codeine Momentum Tolectin  Asperbuf Duoprin Mono-gesic Trendar  Aspergum Duradyne Motrin or Motrin IB Triminicin  Aspirin plain, buffered or enteric coated Durasal Myochrisine Trigesic  Aspirin Suppositories Easprin Nalfon Trillsate  Aspirin with Codeine Ecotrin Regular or Extra Strength Naprosyn Uracel  Atromid-S Efficin Naproxen Ursinus  Auranofin Capsules Elmiron Neocylate Vanquish  Axotal Emagrin Norgesic Verin  Azathioprine Empirin or Empirin with Codeine Normiflo Vitamin E  Azolid Emprazil Nuprin Voltaren  Bayer Aspirin plain, buffered or children's or timed BC Tablets or powders Encaprin Orgaran Warfarin Sodium  Buff-a-Comp Enoxaparin Orudis Zorpin  Buff-a-Comp with Codeine Equegesic Os-Cal-Gesic   Buffaprin Excedrin plain, buffered or Extra Strength Oxalid   Bufferin Arthritis Strength Feldene Oxphenbutazone   Bufferin plain or Extra Strength Feldene Capsules Oxycodone with Aspirin   Bufferin with Codeine Fenoprofen Fenoprofen Pabalate or Pabalate-SF   Buffets II Flogesic Panagesic   Buffinol plain or Extra Strength Florinal or Florinal with Codeine Panwarfarin   Buf-Tabs Flurbiprofen Penicillamine   Butalbital Compound Four-way cold tablets Penicillin   Butazolidin Fragmin Pepto-Bismol   Carbenicillin Geminisyn Percodan   Carna Arthritis Reliever Geopen Persantine   Carprofen Gold's salt Persistin   Chloramphenicol Goody's Phenylbutazone   Chloromycetin Haltrain Piroxlcam   Clmetidine heparin Plaquenil   Cllnoril Hyco-pap Ponstel   Clofibrate Hydroxy chloroquine Propoxyphen         Before stopping any of these medications, be sure to consult the physician who ordered them.  Some, such as Coumadin (Warfarin) are ordered to prevent or treat serious conditions such as "deep thrombosis", "pumonary embolisms", and other heart problems.  The amount of time that you may need off of the medication may also vary with the medication and the reason for which you were  taking it.  If you are taking any of these medications, please make sure you notify your pain physician before you undergo any procedures.

## 2016-12-17 NOTE — Progress Notes (Signed)
Safety precautions to be maintained throughout the outpatient stay will include: orient to surroundings, keep bed in low position, maintain call bell within reach at all times, provide assistance with transfer out of bed and ambulation.  

## 2016-12-17 NOTE — Patient Instructions (Addendum)
____________________________________________________________________________________________  Post-Procedure instructions Instructions:  Apply ice: Fill a plastic sandwich bag with crushed ice. Cover it with a small towel and apply to injection site. Apply for 15 minutes then remove x 15 minutes. Repeat sequence on day of procedure, until you go to bed. The purpose is to minimize swelling and discomfort after procedure.  Apply heat: Apply heat to procedure site starting the day following the procedure. The purpose is to treat any soreness and discomfort from the procedure.  Food intake: Start with clear liquids (like water) and advance to regular food, as tolerated.   Physical activities: Keep activities to a minimum for the first 8 hours after the procedure.   Driving: If you have received any sedation, you are not allowed to drive for 24 hours after your procedure.  Blood thinner: Restart your blood thinner 6 hours after your procedure. (Only for those taking blood thinners)  Insulin: As soon as you can eat, you may resume your normal dosing schedule. (Only for those taking insulin)  Infection prevention: Keep procedure site clean and dry.  Post-procedure Pain Diary: Extremely important that this be done correctly and accurately. Recorded information will be used to determine the next step in treatment.  Pain evaluated is that of treated area only. Do not include pain from an untreated area.  Complete every hour, on the hour, for the initial 8 hours. Set an alarm to help you do this part accurately.  Do not go to sleep and have it completed later. It will not be accurate.  Follow-up appointment: Keep your follow-up appointment after the procedure. Usually 2 weeks for most procedures. (6 weeks in the case of radiofrequency.) Bring you pain diary.  Expect:  From numbing medicine (AKA: Local Anesthetics): Numbness or decrease in pain.  Onset: Full effect within 15 minutes of  injected.  Duration: It will depend on the type of local anesthetic used. On the average, 1 to 8 hours.   From steroids: Decrease in swelling or inflammation. Once inflammation is improved, relief of the pain will follow.  Onset of benefits: Depends on the amount of swelling present. The more swelling, the longer it will take for the benefits to be seen. In some cases, up to 10 days.  Duration: Steroids will stay in the system x 2 weeks. Duration of benefits will depend on multiple posibilities including persistent irritating factors.  From procedure: Some discomfort is to be expected once the numbing medicine wears off. This should be minimal if ice and heat are applied as instructed. Call if:  You experience numbness and weakness that gets worse with time, as opposed to wearing off.  New onset bowel or bladder incontinence. (Spinal procedures only)  Emergency Numbers:  Durning business hours (Monday - Thursday, 8:00 AM - 4:00 PM) (Friday, 9:00 AM - 12:00 Noon): [phone]  After hours: [phone] ____________________________________________________________________________________________  Pain Management Discharge Instructions  General Discharge Instructions :  If you need to reach your doctor call: Monday-Friday 8:00 am - 4:00 pm at [phone] or toll free [phone].  After clinic hours [phone] to have operator reach doctor.  Bring all of your medication bottles to all your appointments in the pain clinic.  To cancel or reschedule your appointment with Pain Management please remember to call 24 hours in advance to avoid a fee.  Refer to the educational materials which you have been given on: General Risks, I had my Procedure. Discharge Instructions, Post Sedation.  Post Procedure Instructions:  The drugs you   were given will stay in your system until tomorrow, so for the next 24 hours you should not drive, make any legal decisions or drink any alcoholic  beverages.  You may eat anything you prefer, but it is better to start with liquids then soups and crackers, and gradually work up to solid foods.  Please notify your doctor immediately if you have any unusual bleeding, trouble breathing or pain that is not related to your normal pain.  Depending on the type of procedure that was done, some parts of your body may feel week and/or numb.  This usually clears up by tonight or the next day.  Walk with the use of an assistive device or accompanied by an adult for the 24 hours.  You may use ice on the affected area for the first 24 hours.  Put ice in a Ziploc bag and cover with a towel and place against area 15 minutes on 15 minutes off.  You may switch to heat after 24 hours.Radiofrequency Lesioning Radiofrequency lesioning is a procedure that is performed to relieve pain. The procedure is often used for back, neck, or arm pain. Radiofrequency lesioning involves the use of a machine that creates radio waves to make heat. During the procedure, the heat is applied to the nerve that carries the pain signal. The heat damages the nerve and interferes with the pain signal. Pain relief usually starts about 2 weeks after the procedure and lasts for 6 months to 1 year. Tell a health care provider about: Any allergies you have. All medicines you are taking, including vitamins, herbs, eye drops, creams, and over-the-counter medicines. Any problems you or family members have had with anesthetic medicines. Any blood disorders you have. Any surgeries you have had. Any medical conditions you have. Whether you are pregnant or may be pregnant. What are the risks? Generally, this is a safe procedure. However, problems may occur, including: Pain or soreness at the injection site. Infection at the injection site. Damage to nerves or blood vessels.  What happens before the procedure? Ask your health care provider about: Changing or stopping your regular medicines.  This is especially important if you are taking diabetes medicines or blood thinners. Taking medicines such as aspirin and ibuprofen. These medicines can thin your blood. Do not take these medicines before your procedure if your health care provider instructs you not to. Follow instructions from your health care provider about eating or drinking restrictions. Plan to have someone take you home after the procedure. If you go home right after the procedure, plan to have someone with you for 24 hours. What happens during the procedure? You will be given one or more of the following: A medicine to help you relax (sedative). A medicine to numb the area (local anesthetic). You will be awake during the procedure. You will need to be able to talk with the health care provider during the procedure. With the help of a type of X-ray (fluoroscopy), the health care provider will insert a radiofrequency needle into the area to be treated. Next, a wire that carries the radio waves (electrode) will be put through the radiofrequency needle. An electrical pulse will be sent through the electrode to verify the correct nerve. You will feel a tingling sensation, and you may have muscle twitching. Then, the tissue that is around the needle tip will be heated by an electric current that is passed using the radiofrequency machine. This will numb the nerves. A bandage (dressing) will be put  on the insertion area after the procedure is done. The procedure may vary among health care providers and hospitals. What happens after the procedure? Your blood pressure, heart rate, breathing rate, and blood oxygen level will be monitored often until the medicines you were given have worn off. Return to your normal activities as directed by your health care provider. This information is not intended to replace advice given to you by your health care provider. Make sure you discuss any questions you have with your health care  provider. Document Released: 02/14/2011 Document Revised: 11/24/2015 Document Reviewed: 07/26/2014 Elsevier Interactive Patient Education  2018 [place] Radiofrequency Lesioning, Care After Refer to this sheet in the next few weeks. These instructions provide you with information about caring for yourself after your procedure. Your health care provider may also give you more specific instructions. Your treatment has been planned according to current medical practices, but problems sometimes occur. Call your health care provider if you have any problems or questions after your procedure. What can I expect after the procedure? After the procedure, it is common to have: Pain from the burned nerve. Temporary numbness.  Follow these instructions at home: Take over-the-counter and prescription medicines only as told by your health care provider. Return to your normal activities as told by your health care provider. Ask your health care provider what activities are safe for you. Pay close attention to how you feel after the procedure. If you start to have pain, write down when it hurts and how it feels. This will help you and your health care provider to know if you need an additional treatment. Check your needle insertion site every day for signs of infection. Watch for: Redness, swelling, or pain. Fluid, blood, or pus. Keep all follow-up visits as told by your health care provider. This is important. Contact a health care provider if: Your pain does not get better. You have redness, swelling, or pain at the needle insertion site. You have fluid, blood, or pus coming from the needle insertion site. You have a fever. Get help right away if: You develop sudden, severe pain. You develop numbness or tingling near the procedure site that does not go away. This information is not intended to replace advice given to you by your health care provider. Make sure you discuss any questions you have  with your health care provider. Document Released: 02/15/2011 Document Revised: 11/24/2015 Document Reviewed: 07/26/2014 Elsevier Interactive Patient Education  2018 [place]. Pain Management Discharge Instructions  General Discharge Instructions :  If you need to reach your doctor call: Monday-Friday 8:00 am - 4:00 pm at [phone] or toll free [phone].  After clinic hours [phone] to have operator reach doctor.  Bring all of your medication bottles to all your appointments in the pain clinic.  To cancel or reschedule your appointment with Pain Management please remember to call 24 hours in advance to avoid a fee.  Refer to the educational materials which you have been given on: General Risks, I had my Procedure. Discharge Instructions, Post Sedation.  Post Procedure Instructions:  The drugs you were given will stay in your system until tomorrow, so for the next 24 hours you should not drive, make any legal decisions or drink any alcoholic beverages.  You may eat anything you prefer, but it is better to start with liquids then soups and crackers, and gradually work up to solid foods.  Please notify your doctor immediately if you have any unusual bleeding, trouble breathing or  pain that is not related to your normal pain.  Depending on the type of procedure that was done, some parts of your body may feel week and/or numb.  This usually clears up by tonight or the next day.  Walk with the use of an assistive device or accompanied by an adult for the 24 hours.  You may use ice on the affected area for the first 24 hours.  Put ice in a Ziploc bag and cover with a towel and place against area 15 minutes on 15 minutes off.  You may switch to heat after 24 hours.GENERAL RISKS AND COMPLICATIONS  What are the risk, side effects and possible complications? Generally speaking, most procedures are safe.  However, with any procedure there are risks, side effects, and the  possibility of complications.  The risks and complications are dependent upon the sites that are lesioned, or the type of nerve block to be performed.  The closer the procedure is to the spine, the more serious the risks are.  Great care is taken when placing the radio frequency needles, block needles or lesioning probes, but sometimes complications can occur. Infection: Any time there is an injection through the skin, there is a risk of infection.  This is why sterile conditions are used for these blocks.  There are four possible types of infection. Localized skin infection. Central Nervous System Infection-This can be in the form of Meningitis, which can be deadly. Epidural Infections-This can be in the form of an epidural abscess, which can cause pressure inside of the spine, causing compression of the spinal cord with subsequent paralysis. This would require an emergency surgery to decompress, and there are no guarantees that the patient would recover from the paralysis. Discitis-This is an infection of the intervertebral discs.  It occurs in about 1% of discography procedures.  It is difficult to treat and it may lead to surgery.        2. Pain: the needles have to go through skin and soft tissues, will cause soreness.       3. Damage to internal structures:  The nerves to be lesioned may be near blood vessels or    other nerves which can be potentially damaged.       4. Bleeding: Bleeding is more common if the patient is taking blood thinners such as  aspirin, Coumadin, Ticiid, Plavix, etc., or if he/she have some genetic predisposition  such as hemophilia. Bleeding into the spinal canal can cause compression of the spinal  cord with subsequent paralysis.  This would require an emergency surgery to  decompress and there are no guarantees that the patient would recover from the  paralysis.       5. Pneumothorax:  Puncturing of a lung is a possibility, every time a needle is introduced in  the area of  the chest or upper back.  Pneumothorax refers to free air around the  collapsed lung(s), inside of the thoracic cavity (chest cavity).  Another two possible  complications related to a similar event would include: Hemothorax and Chylothorax.   These are variations of the Pneumothorax, where instead of air around the collapsed  lung(s), you may have blood or chyle, respectively.       6. Spinal headaches: They may occur with any procedures in the area of the spine.       7. Persistent CSF (Cerebro-Spinal Fluid) leakage: This is a rare problem, but may occur  with prolonged intrathecal or epidural catheters either  due to the formation of a fistulous  track or a dural tear.       8. Nerve damage: By working so close to the spinal cord, there is always a possibility of  nerve damage, which could be as serious as a permanent spinal cord injury with  paralysis.       9. Death:  Although rare, severe deadly allergic reactions known as "Anaphylactic  reaction" can occur to any of the medications used.      10. Worsening of the symptoms:  We can always make thing worse.  What are the chances of something like this happening? Chances of any of this occuring are extremely low.  By statistics, you have more of a chance of getting killed in a motor vehicle accident: while driving to the hospital than any of the above occurring .  Nevertheless, you should be aware that they are possibilities.  In general, it is similar to taking a shower.  Everybody knows that you can slip, hit your head and get killed.  Does that mean that you should not shower again?  Nevertheless always keep in mind that statistics do not mean anything if you happen to be on the wrong side of them.  Even if a procedure has a 1 (one) in a 1,000,000 (million) chance of going wrong, it you happen to be that one..Also, keep in mind that by statistics, you have more of a chance of having something go wrong when taking medications.  Who should not have  this procedure? If you are on a blood thinning medication (e.g. Coumadin, Plavix, see list of "Blood Thinners"), or if you have an active infection going on, you should not have the procedure.  If you are taking any blood thinners, please inform your physician.  How should I prepare for this procedure? Do not eat or drink anything at least six hours prior to the procedure. Bring a driver with you .  It cannot be a taxi. Come accompanied by an adult that can drive you back, and that is strong enough to help you if your legs get weak or numb from the local anesthetic. Take all of your medicines the morning of the procedure with just enough water to swallow them. If you have diabetes, make sure that you are scheduled to have your procedure done first thing in the morning, whenever possible. If you have diabetes, take only half of your insulin dose and notify our nurse that you have done so as soon as you arrive at the clinic. If you are diabetic, but only take blood sugar pills (oral hypoglycemic), then do not take them on the morning of your procedure.  You may take them after you have had the procedure. Do not take aspirin or any aspirin-containing medications, at least eleven (11) days prior to the procedure.  They may prolong bleeding. Wear loose fitting clothing that may be easy to take off and that you would not mind if it got stained with Betadine or blood. Do not wear any jewelry or perfume Remove any nail coloring.  It will interfere with some of our monitoring equipment.  NOTE: Remember that this is not meant to be interpreted as a complete list of all possible complications.  Unforeseen problems may occur.  BLOOD THINNERS The following drugs contain aspirin or other products, which can cause increased bleeding during surgery and should not be taken for 2 weeks prior to and 1 week after surgery.  If you should  need take something for relief of minor pain, you may take acetaminophen which is  found in Tylenol,m Datril, Anacin-3 and Panadol. It is not blood thinner. The products listed below are.  Do not take any of the products listed below in addition to any listed on your instruction sheet.  A.P.C or A.P.C with Codeine Codeine Phosphate Capsules #3 Ibuprofen Ridaura  ABC compound Congesprin Imuran rimadil  Advil Cope Indocin Robaxisal  Alka-Seltzer Effervescent Pain Reliever and Antacid Coricidin or Coricidin-D  Indomethacin Rufen  Alka-Seltzer plus Cold Medicine Cosprin Ketoprofen S-A-C Tablets  Anacin Analgesic Tablets or Capsules Coumadin Korlgesic Salflex  Anacin Extra Strength Analgesic tablets or capsules CP-2 Tablets Lanoril Salicylate  Anaprox Cuprimine Capsules Levenox Salocol  Anexsia-D Dalteparin Magan Salsalate  Anodynos Darvon compound Magnesium Salicylate Sine-off  Ansaid Dasin Capsules Magsal Sodium Salicylate  Anturane Depen Capsules Marnal Soma  APF Arthritis pain formula Dewitt's Pills Measurin Stanback  Argesic Dia-Gesic Meclofenamic Sulfinpyrazone  Arthritis Bayer Timed Release Aspirin Diclofenac Meclomen Sulindac  Arthritis pain formula Anacin Dicumarol Medipren Supac  Analgesic (Safety coated) Arthralgen Diffunasal Mefanamic Suprofen  Arthritis Strength Bufferin Dihydrocodeine Mepro Compound Suprol  Arthropan liquid Dopirydamole Methcarbomol with Aspirin Synalgos  ASA tablets/Enseals Disalcid Micrainin Tagament  Ascriptin Doan's Midol Talwin  Ascriptin A/D Dolene Mobidin Tanderil  Ascriptin Extra Strength Dolobid Moblgesic Ticlid  Ascriptin with Codeine Doloprin or Doloprin with Codeine Momentum Tolectin  Asperbuf Duoprin Mono-gesic Trendar  Aspergum Duradyne Motrin or Motrin IB Triminicin  Aspirin plain, buffered or enteric coated Durasal Myochrisine Trigesic  Aspirin Suppositories Easprin Nalfon Trillsate  Aspirin with Codeine Ecotrin Regular or Extra Strength Naprosyn Uracel  Atromid-S Efficin Naproxen Ursinus  Auranofin Capsules Elmiron  Neocylate Vanquish  Axotal Emagrin Norgesic Verin  Azathioprine Empirin or Empirin with Codeine Normiflo Vitamin E  Azolid Emprazil Nuprin Voltaren  Bayer Aspirin plain, buffered or children's or timed BC Tablets or powders Encaprin Orgaran Warfarin Sodium  Buff-a-Comp Enoxaparin Orudis Zorpin  Buff-a-Comp with Codeine Equegesic Os-Cal-Gesic   Buffaprin Excedrin plain, buffered or Extra Strength Oxalid   Bufferin Arthritis Strength Feldene Oxphenbutazone   Bufferin plain or Extra Strength Feldene Capsules Oxycodone with Aspirin   Bufferin with Codeine Fenoprofen Fenoprofen Pabalate or Pabalate-SF   Buffets II Flogesic Panagesic   Buffinol plain or Extra Strength Florinal or Florinal with Codeine Panwarfarin   Buf-Tabs Flurbiprofen Penicillamine   Butalbital Compound Four-way cold tablets Penicillin   Butazolidin Fragmin Pepto-Bismol   Carbenicillin Geminisyn Percodan   Carna Arthritis Reliever Geopen Persantine   Carprofen Gold's salt Persistin   Chloramphenicol Goody's Phenylbutazone   Chloromycetin Haltrain Piroxlcam   Clmetidine heparin Plaquenil   Cllnoril Hyco-pap Ponstel   Clofibrate Hydroxy chloroquine Propoxyphen         Before stopping any of these medications, be sure to consult the physician who ordered them.  Some, such as Coumadin (Warfarin) are ordered to prevent or treat serious conditions such as "deep thrombosis", "pumonary embolisms", and other heart problems.  The amount of time that you may need off of the medication may also vary with the medication and the reason for which you were taking it.  If you are taking any of these medications, please make sure you notify your pain physician before you undergo any procedures.

## 2016-12-18 ENCOUNTER — Telehealth: Payer: Self-pay | Admitting: *Deleted

## 2016-12-18 NOTE — Telephone Encounter (Signed)
Denies problems post procedure. 

## 2016-12-21 ENCOUNTER — Other Ambulatory Visit: Payer: Self-pay | Admitting: Family Medicine

## 2016-12-26 ENCOUNTER — Telehealth: Payer: Self-pay | Admitting: Pain Medicine

## 2016-12-26 NOTE — Telephone Encounter (Signed)
Patient had RF last Monday, doesn't understand why she is hurting non stop, please call to discuss with patient.

## 2016-12-26 NOTE — Telephone Encounter (Signed)
Spoke with patient.  She states she is having the same pain that she had before except that it hurt a little worse.  Denies fever or bowel/bladder problems or redness at injection sites.  Explained to patient that it was not unusual to have more pain after the procedure.  States she was given Tramadol for that reason.  Informed patient that it may take up to 6 weeks before it is healed and she is feeling better.  Informed patient that if she had further questions or concerns that we would be glad to bring her in to check her out.  Patient states she will give it some time and call us if she feels like whe needs to be seen.

## 2017-01-10 ENCOUNTER — Telehealth: Payer: Self-pay | Admitting: *Deleted

## 2017-01-10 NOTE — Telephone Encounter (Signed)
Noted  

## 2017-01-10 NOTE — Telephone Encounter (Signed)
fyi

## 2017-01-10 NOTE — Telephone Encounter (Signed)
Cheri Guppy from [facility] reported pt being part of the heart failure program A fax will be sent over in reference to pt weights

## 2017-01-20 ENCOUNTER — Other Ambulatory Visit: Payer: Self-pay | Admitting: Family Medicine

## 2017-01-26 ENCOUNTER — Other Ambulatory Visit: Payer: Self-pay | Admitting: Family Medicine

## 2017-01-28 ENCOUNTER — Ambulatory Visit: Payer: Medicare Other | Admitting: Pain Medicine

## 2017-01-28 ENCOUNTER — Encounter: Payer: Self-pay | Admitting: Pain Medicine

## 2017-01-28 ENCOUNTER — Ambulatory Visit: Payer: Medicare Other | Attending: Pain Medicine | Admitting: Pain Medicine

## 2017-01-28 VITALS — BP 123/87 | HR 119 | Temp 95.7°F | Resp 16 | Ht 59.5 in | Wt >= 6400 oz

## 2017-01-28 DIAGNOSIS — Z79899 Other long term (current) drug therapy: Secondary | ICD-10-CM | POA: Diagnosis not present

## 2017-01-28 DIAGNOSIS — I4891 Unspecified atrial fibrillation: Secondary | ICD-10-CM | POA: Insufficient documentation

## 2017-01-28 DIAGNOSIS — F3341 Major depressive disorder, recurrent, in partial remission: Secondary | ICD-10-CM | POA: Insufficient documentation

## 2017-01-28 DIAGNOSIS — M4696 Unspecified inflammatory spondylopathy, lumbar region: Secondary | ICD-10-CM | POA: Diagnosis not present

## 2017-01-28 DIAGNOSIS — Z885 Allergy status to narcotic agent status: Secondary | ICD-10-CM | POA: Insufficient documentation

## 2017-01-28 DIAGNOSIS — E039 Hypothyroidism, unspecified: Secondary | ICD-10-CM | POA: Insufficient documentation

## 2017-01-28 DIAGNOSIS — M419 Scoliosis, unspecified: Secondary | ICD-10-CM | POA: Insufficient documentation

## 2017-01-28 DIAGNOSIS — Z803 Family history of malignant neoplasm of breast: Secondary | ICD-10-CM | POA: Insufficient documentation

## 2017-01-28 DIAGNOSIS — K219 Gastro-esophageal reflux disease without esophagitis: Secondary | ICD-10-CM | POA: Insufficient documentation

## 2017-01-28 DIAGNOSIS — M545 Low back pain: Secondary | ICD-10-CM | POA: Diagnosis not present

## 2017-01-28 DIAGNOSIS — F419 Anxiety disorder, unspecified: Secondary | ICD-10-CM | POA: Insufficient documentation

## 2017-01-28 DIAGNOSIS — Z881 Allergy status to other antibiotic agents status: Secondary | ICD-10-CM | POA: Insufficient documentation

## 2017-01-28 DIAGNOSIS — I340 Nonrheumatic mitral (valve) insufficiency: Secondary | ICD-10-CM | POA: Diagnosis not present

## 2017-01-28 DIAGNOSIS — G894 Chronic pain syndrome: Secondary | ICD-10-CM | POA: Diagnosis not present

## 2017-01-28 DIAGNOSIS — M488X6 Other specified spondylopathies, lumbar region: Secondary | ICD-10-CM | POA: Insufficient documentation

## 2017-01-28 DIAGNOSIS — D649 Anemia, unspecified: Secondary | ICD-10-CM | POA: Insufficient documentation

## 2017-01-28 DIAGNOSIS — M1611 Unilateral primary osteoarthritis, right hip: Secondary | ICD-10-CM | POA: Insufficient documentation

## 2017-01-28 DIAGNOSIS — E785 Hyperlipidemia, unspecified: Secondary | ICD-10-CM | POA: Insufficient documentation

## 2017-01-28 DIAGNOSIS — Z7901 Long term (current) use of anticoagulants: Secondary | ICD-10-CM | POA: Insufficient documentation

## 2017-01-28 DIAGNOSIS — R109 Unspecified abdominal pain: Secondary | ICD-10-CM | POA: Insufficient documentation

## 2017-01-28 DIAGNOSIS — Z91040 Latex allergy status: Secondary | ICD-10-CM | POA: Diagnosis not present

## 2017-01-28 DIAGNOSIS — M4316 Spondylolisthesis, lumbar region: Secondary | ICD-10-CM | POA: Insufficient documentation

## 2017-01-28 DIAGNOSIS — H3530 Unspecified macular degeneration: Secondary | ICD-10-CM | POA: Diagnosis not present

## 2017-01-28 DIAGNOSIS — I5022 Chronic systolic (congestive) heart failure: Secondary | ICD-10-CM | POA: Insufficient documentation

## 2017-01-28 DIAGNOSIS — M5136 Other intervertebral disc degeneration, lumbar region: Secondary | ICD-10-CM | POA: Insufficient documentation

## 2017-01-28 DIAGNOSIS — I110 Hypertensive heart disease with heart failure: Secondary | ICD-10-CM | POA: Diagnosis not present

## 2017-01-28 DIAGNOSIS — M810 Age-related osteoporosis without current pathological fracture: Secondary | ICD-10-CM | POA: Diagnosis not present

## 2017-01-28 DIAGNOSIS — M47816 Spondylosis without myelopathy or radiculopathy, lumbar region: Secondary | ICD-10-CM | POA: Insufficient documentation

## 2017-01-28 DIAGNOSIS — Z85828 Personal history of other malignant neoplasm of skin: Secondary | ICD-10-CM | POA: Insufficient documentation

## 2017-01-28 DIAGNOSIS — M5124 Other intervertebral disc displacement, thoracic region: Secondary | ICD-10-CM | POA: Diagnosis not present

## 2017-01-28 DIAGNOSIS — G8929 Other chronic pain: Secondary | ICD-10-CM | POA: Diagnosis not present

## 2017-01-28 NOTE — Progress Notes (Signed)
Safety precautions to be maintained throughout the outpatient stay will include: orient to surroundings, keep bed in low position, maintain call bell within reach at all times, provide assistance with transfer out of bed and ambulation.  

## 2017-01-28 NOTE — Progress Notes (Signed)
Patient's Name: Amanda Soto  MRN: [id number]  Referring Provider: [name], MD  DOB: 18-Jul-1924  PCP: [name], MD  DOS: 01/28/2017  Note by: Gaspar Cola, MD  Service setting: Ambulatory outpatient  Specialty: Interventional Pain Management  Location: [facility] (AMB) Pain Management Facility    Patient type: Established   Primary Reason(s) for Visit: Encounter for post-procedure evaluation of chronic illness with mild to moderate exacerbation CC: Back Pain (lower)  HPI  [name] is a 83 y.o. year old, female patient, who comes today for a post-procedure evaluation. She has Chronic systolic heart failure ([place]); Bradycardia; Atrial fibrillation ([place]); HTN (hypertension); Abdominal discomfort; Exertional shortness of breath; Elevated glucose; Hypothyroidism; Lightheadedness; Symptomatic anemia; Anemia; Anxiety; Degeneration of intervertebral disc of lumbar region; Recurrent major depressive disorder, in partial remission ([place]); Cardiac murmur; H/O neoplasm; HLD (hyperlipidemia); MI (mitral incompetence); OP (osteoporosis); Chronic low back pain (Location of Primary Source of Pain) (Right); Lumbar facet syndrome (Location of Primary Source of Pain) (Right); Grade 1 Anterolisthesis of L4 over L5 (5 mm); Lumbar spondylosis; Scoliosis of lumbar spine (concave to right side); Lumbar facet arthropathy ([place]); Osteoarthritis of hip (Right); Thoracic paracentral T7-8 disc protrusion; Depression; UTI (lower urinary tract infection); Bruising; Diarrhea; Macular degeneration; Chronic pain syndrome; Tiredness; Closed Colles' fracture; and Acute postoperative pain on her problem list. Her primarily concern today is the Back Pain (lower)  Pain Assessment: Location: Lower Back Radiating: does not radiate Onset: More than a month ago Duration: Chronic pain Quality:  (feeling weak) Severity: 0-No pain/10 (self-reported pain score)  Note: Reported level is compatible with observation.                    Timing: Constant Modifying factors: nothing now  [name] comes in today for post-procedure evaluation after the treatment done on 12/26/2016.  Further details on both, my assessment(s), as well as the proposed treatment plan, please see below.  Post-Procedure Assessment  12/26/2016 Procedure: Therapeutic right-sided lumbar facet radiofrequency ablation under fluoroscopic guidance and IV sedation Pre-procedure pain score:  6/10 Post-procedure pain score: 0/10         Influential Factors: BMI: 223.42 kg/m Intra-procedural challenges: None observed.         Assessment challenges: None detected.              Reported side-effects: None.        Post-procedural adverse reactions or complications: None reported         Sedation: Sedation provided. When no sedatives are used, the analgesic levels obtained are directly associated to the effectiveness of the local anesthetics. However, when sedation is provided, the level of analgesia obtained during the initial 1 hour following the intervention, is believed to be the result of a combination of factors. These factors may include, but are not limited to: 1. The effectiveness of the local anesthetics used. 2. The effects of the analgesic(s) and/or anxiolytic(s) used. 3. The degree of discomfort experienced by the patient at the time of the procedure. 4. The patients ability and reliability in recalling and recording the events. 5. The presence and influence of possible secondary gains and/or psychosocial factors. Reported result: Relief experienced during the 1st hour after the procedure: 100 % (Ultra-Short Term Relief) Interpretative annotation: Clinically appropriate result. Analgesia during this period is likely to be Local Anesthetic and/or IV Sedative (Analgesic/Anxiolytic) related.          Effects of local anesthetic: The analgesic effects attained during this period are  directly associated to the localized infiltration of local  anesthetics and therefore cary significant diagnostic value as to the etiological location, or anatomical origin, of the pain. Expected duration of relief is directly dependent on the pharmacodynamics of the local anesthetic used. Long-acting (4-6 hours) anesthetics used.  Reported result: Relief during the next 4 to 6 hour after the procedure: 100 % (Short-Term Relief) Interpretative annotation: Clinically appropriate result. Analgesia during this period is likely to be Local Anesthetic-related.          Long-term benefit: Defined as the period of time past the expected duration of local anesthetics (1 hour for short-acting and 4-6 hours for long-acting). With the possible exception of prolonged sympathetic blockade from the local anesthetics, benefits during this period are typically attributed to, or associated with, other factors such as analgesic sensory neuropraxia, antiinflammatory effects, or beneficial biochemical changes provided by agents other than the local anesthetics.  Reported result: Extended relief following procedure: 100 % (Long-Term Relief) Interpretative annotation: Clinically appropriate result. Good relief. No permanent benefit expected. Inflammation plays a part in the etiology to the pain.          Current benefits: Defined as persistent relief that continues at this point in time.   Reported results: Treated area: 90 %       Interpretative annotation: Recurrence of symptoms. No permanent benefit expected. Effective diagnostic intervention.          Interpretation: Results would suggest a successful diagnostic intervention.          Laboratory Chemistry  Inflammation Markers (CRP: Acute Phase) (ESR: Chronic Phase) Lab Results  Component Value Date   ESRSEDRATE 18 07/25/2016                 Renal Function Markers Lab Results  Component Value Date   BUN 14 11/02/2016   CREATININE 0.67 11/02/2016   GFRAA >60 11/10/2015   GFRNONAA >60 11/10/2015                  Hepatic Function Markers Lab Results  Component Value Date   AST 28 11/02/2016   ALT 18 11/02/2016   ALBUMIN 3.9 11/02/2016   ALKPHOS 56 11/02/2016                 Electrolytes Lab Results  Component Value Date   NA 139 11/02/2016   K 4.2 11/02/2016   CL 103 11/02/2016   CALCIUM 9.3 11/02/2016   MG 2.1 11/10/2015                 Neuropathy Markers Lab Results  Component Value Date   VITAMINB12 227 11/10/2015                 Bone Pathology Markers Lab Results  Component Value Date   ALKPHOS 56 11/02/2016   CALCIUM 9.3 11/02/2016                 Coagulation Parameters Lab Results  Component Value Date   INR 1.3 02/05/2014   LABPROT 15.5 (H) 02/05/2014   APTT < 23.0 (L) 02/05/2014   PLT 229 11/02/2016                 Cardiovascular Markers Lab Results  Component Value Date   HGB 13.6 11/02/2016   HCT 42.6 11/02/2016                 Note: Lab results reviewed.  Recent Diagnostic Imaging Review  Dg C-arm 1-60 Min-no Report  Result Date: 12/17/2016 Fluoroscopy was utilized by the requesting physician.  No radiographic interpretation.   Note: Imaging results reviewed.          Meds   Current Meds  Medication Sig  . acetaminophen (TYLENOL) 325 MG tablet Take 650 mg by mouth every 6 (six) hours as needed for moderate pain, fever or headache.  Marland Kitchen apixaban (ELIQUIS) 2.5 MG TABS tablet Take 2.5 mg by mouth 2 (two) times daily.  Marland Kitchen atorvastatin (LIPITOR) 10 MG tablet Take 1 tablet (10 mg total) by mouth daily.  . Difluprednate (DUREZOL) 0.05 % EMUL Frequency:PHARMDIR   Dosage:0.0     Instructions:  Note:One drop four times daily in the operative eye. Dose: 0.05 %  . docusate sodium (COLACE) 100 MG capsule Take by mouth 2 (two) times daily.   . furosemide (LASIX) 20 MG tablet TAKE 1 TABLET BY MOUTH EVERY DAY  . iron polysaccharides (NIFEREX) 150 MG capsule Take 1 capsule (150 mg total) by mouth daily.  . Multiple Vitamin (MULTIVITAMIN) capsule Take by mouth.  Marland Kitchen  omeprazole (PRILOSEC) 20 MG capsule Take 20 mg by mouth daily.  . sotalol (BETAPACE) 80 MG tablet Take 40 mg by mouth 2 (two) times daily.   Marland Kitchen SYNTHROID 50 MCG tablet TAKE 1 TABLET (50 MCG TOTAL) BY MOUTH DAILY BEFORE BREAKFAST.  Marland Kitchen traMADol (ULTRAM) 50 MG tablet Take 2 tablets (100 mg total) by mouth every 6 (six) hours as needed for severe pain.    ROS  Constitutional: Denies any fever or chills Gastrointestinal: No reported hemesis, hematochezia, vomiting, or acute GI distress Musculoskeletal: Denies any acute onset joint swelling, redness, loss of ROM, or weakness Neurological: No reported episodes of acute onset apraxia, aphasia, dysarthria, agnosia, amnesia, paralysis, loss of coordination, or loss of consciousness  Allergies  Ms. Dapolito is allergic to hydrocodone; ciprofloxacin; and latex.  PFSH  Drug: Ms. Schools  reports that she does not use drugs. Alcohol:  reports that she does not drink alcohol. Tobacco:  reports that she has never smoked. She has never used smokeless tobacco. Medical:  has a past medical history of Arthritis; Atrial fibrillation ([place]); CHF (congestive heart failure) ([place]); Chickenpox; Chronic abdominal pain (01/16/2014); Degenerative arthritis of hip (08/19/2014); Degenerative arthritis of lumbar spine (11/18/2013); Depression; Diverticulitis; GERD (gastroesophageal reflux disease); Heart murmur; Hyperlipidemia; Hypertension; Hypothyroid; Neuritis or radiculitis due to rupture of lumbar intervertebral disc (11/18/2013); Skin cancer; and Trochanteric bursitis of right hip (11/18/2013). Surgical: Ms. Conyer  has a past surgical history that includes Cataract extraction; Partial hysterectomy; Appendectomy; and Tonsillectomy. Family: family history includes Breast cancer in her mother and sister; Heart disease in her unknown relative; Hypertension in her unknown relative; Stroke in her maternal grandmother.  Constitutional Exam  General appearance: Well nourished, well  developed, and well hydrated. In no apparent acute distress Vitals:   01/28/17 1141  BP: 123/87  Pulse: (!) 119  Resp: 16  Temp: (!) 95.7 F (35.4 C)  TempSrc: Oral  SpO2: 98%  Weight: (!) 1125 lb (510.3 kg)  Height: 4' 11.5" (1.511 m)   BMI Assessment: Estimated body mass index is 223.42 kg/m as calculated from the following:   Height as of this encounter: 4' 11.5" (1.511 m).   Weight as of this encounter: 1125 lb (510.3 kg).  BMI interpretation table: BMI level Category Range association with higher incidence of chronic pain  <18 kg/m2 Underweight   18.5-24.9 kg/m2 Ideal body weight   25-29.9 kg/m2 Overweight Increased incidence by 20%  30-34.9 kg/m2 Obese (  Class I) Increased incidence by 68%  35-39.9 kg/m2 Severe obesity (Class II) Increased incidence by 136%  >40 kg/m2 Extreme obesity (Class III) Increased incidence by 254%   BMI Readings from Last 4 Encounters:  01/28/17 223.42 kg/m  12/17/16 22.64 kg/m  11/02/16 24.08 kg/m  08/01/16 23.23 kg/m   Wt Readings from Last 4 Encounters:  01/28/17 (!) 1125 lb (510.3 kg)  12/17/16 114 lb (51.7 kg)  11/02/16 119 lb 3.2 oz (54.1 kg)  08/01/16 115 lb (52.2 kg)  Psych/Mental status: Alert, oriented x 3 (person, place, & time)       Eyes: PERLA Respiratory: No evidence of acute respiratory distress  Cervical Spine Exam  Inspection: No masses, redness, or swelling Alignment: Symmetrical Functional ROM: Unrestricted ROM      Stability: No instability detected Muscle strength & Tone: Functionally intact Sensory: Unimpaired Palpation: No palpable anomalies              Upper Extremity (UE) Exam    Side: Right upper extremity  Side: Left upper extremity  Inspection: No masses, redness, swelling, or asymmetry. No contractures  Inspection: No masses, redness, swelling, or asymmetry. No contractures  Functional ROM: Unrestricted ROM          Functional ROM: Unrestricted ROM          Muscle strength & Tone: Functionally  intact  Muscle strength & Tone: Functionally intact  Sensory: Unimpaired  Sensory: Unimpaired  Palpation: No palpable anomalies              Palpation: No palpable anomalies              Specialized Test(s): Deferred         Specialized Test(s): Deferred          Thoracic Spine Exam  Inspection: No masses, redness, or swelling Alignment: Symmetrical Functional ROM: Unrestricted ROM Stability: No instability detected Sensory: Unimpaired Muscle strength & Tone: No palpable anomalies  Lumbar Spine Exam  Inspection: No masses, redness, or swelling Alignment: Symmetrical Functional ROM: Unrestricted ROM      Stability: No instability detected Muscle strength & Tone: Functionally intact Sensory: Unimpaired Palpation: No palpable anomalies       Provocative Tests: Lumbar Hyperextension and rotation test: evaluation deferred today       Lumbar Lateral bending test: evaluation deferred today       Patrick's Maneuver: evaluation deferred today                    Gait & Posture Assessment  Ambulation: Unassisted Gait: Relatively normal for age and body habitus Posture: WNL   Lower Extremity Exam    Side: Right lower extremity  Side: Left lower extremity  Inspection: No masses, redness, swelling, or asymmetry. No contractures  Inspection: No masses, redness, swelling, or asymmetry. No contractures  Functional ROM: Unrestricted ROM          Functional ROM: Unrestricted ROM          Muscle strength & Tone: Functionally intact  Muscle strength & Tone: Functionally intact  Sensory: Unimpaired  Sensory: Unimpaired  Palpation: No palpable anomalies  Palpation: No palpable anomalies   Assessment  Primary Diagnosis & Pertinent Problem List: The primary encounter diagnosis was Chronic low back pain (Location of Primary Source of Pain) (Right). Diagnoses of Lumbar facet syndrome (Location of Primary Source of Pain) (Right) and Lumbar spondylosis were also pertinent to this visit.  Status  Diagnosis  Controlled Controlled Controlled 1. Chronic low back  pain (Location of Primary Source of Pain) (Right)   2. Lumbar facet syndrome (Location of Primary Source of Pain) (Right)   3. Lumbar spondylosis     Problems updated and reviewed during this visit: No problems updated. Plan of Care  Pharmacotherapy (Medications Ordered): No orders of the defined types were placed in this encounter.  New Prescriptions   No medications on file   Medications administered today: Ms. Gutierrez had no medications administered during this visit.  Lab-work, procedure(s), and/or referral(s): No orders of the defined types were placed in this encounter.   Interventional management options: Planned, scheduled, and/or pending:   Stop Elaquis for 3 days prior to procedures.  None at this time.   Considering:   Palliative right-sided lumbar facet block    Palliative PRN treatment(s):   Palliative right-sided lumbar facet block    Provider-requested follow-up: Return for Med-Mgmt by Dionisio David, NP.  Future Appointments Date Time Provider [place]  02/11/2017 2:00 PM [name], MD [facility] None  07/26/2017 3:00 PM O'Brien-Blaney, Bryson Corona, LPN [facility] None   Primary Care Physician: [name], MD Location: [facility] Management Facility Note by: Gaspar Cola, MD Date: 01/28/2017; Time: 12:26 PM  There are no Patient Instructions on file for this visit.

## 2017-01-29 ENCOUNTER — Ambulatory Visit: Payer: Medicare Other | Admitting: Pain Medicine

## 2017-02-11 ENCOUNTER — Encounter: Payer: Self-pay | Admitting: Family Medicine

## 2017-02-11 ENCOUNTER — Ambulatory Visit (INDEPENDENT_AMBULATORY_CARE_PROVIDER_SITE_OTHER): Payer: Medicare Other | Admitting: Family Medicine

## 2017-02-11 ENCOUNTER — Other Ambulatory Visit: Payer: Self-pay | Admitting: Family Medicine

## 2017-02-11 VITALS — BP 130/80 | HR 85 | Temp 97.8°F | Wt 114.0 lb

## 2017-02-11 DIAGNOSIS — I482 Chronic atrial fibrillation, unspecified: Secondary | ICD-10-CM

## 2017-02-11 DIAGNOSIS — R300 Dysuria: Secondary | ICD-10-CM

## 2017-02-11 DIAGNOSIS — Z9181 History of falling: Secondary | ICD-10-CM

## 2017-02-11 DIAGNOSIS — I5022 Chronic systolic (congestive) heart failure: Secondary | ICD-10-CM

## 2017-02-11 DIAGNOSIS — F3341 Major depressive disorder, recurrent, in partial remission: Secondary | ICD-10-CM

## 2017-02-11 LAB — POCT URINALYSIS DIPSTICK
BILIRUBIN UA: NEGATIVE
Glucose, UA: NEGATIVE
KETONES UA: NEGATIVE
Nitrite, UA: NEGATIVE
Protein, UA: NEGATIVE
SPEC GRAV UA: 1.015 (ref 1.010–1.025)
Urobilinogen, UA: 0.2 E.U./dL
pH, UA: 5 (ref 5.0–8.0)

## 2017-02-11 MED ORDER — CEPHALEXIN 500 MG PO CAPS: 500.0000 mg | ORAL_CAPSULE | Freq: Two times a day (BID) | ORAL | 0 refills | Status: DC

## 2017-02-11 NOTE — Assessment & Plan Note (Signed)
UA and symptoms concerning for UTI. We'll treat with Keflex. Send urine for culture and microscopy.

## 2017-02-11 NOTE — Progress Notes (Signed)
  Amanda Rumps, MD Phone: [phone]  Amanda Soto is a 83 y.o. female who presents today for follow-up.  A. fib: No recent palpitations. She is currently on sotalol. Also on Eliquis. Does note some bruising with the Eliquis though no excessive bleeding. Follow with cardiology.  Depression: Notes some feeling down and depressed based on her daughter-in-law being sick with cancer. No anxiety. No SI or HI. Does not feel she would like any medicine for this issue as she has responded poorly in the past. She does have support in place at home.   CHF: Taking Lasix. No edema. No dyspnea or orthopnea.  Patient reports over the last week or so she has developed a urinary odor. Also some dysuria and lower abdominal discomfort. No frequency. Some urgency. No fevers. No vaginal discharge.  She does note at times she feels unsteady on her feet. She is afraid she'll fall though has not had any falls recently. She previously did physical therapy and this is beneficial.   PMH: nonsmoker.   ROS see history of present illness  Objective  Physical Exam Vitals:   02/11/17 1409  BP: 130/80  Pulse: 85  Temp: 97.8 F (36.6 C)  SpO2: 95%    BP Readings from Last 3 Encounters:  02/11/17 130/80  01/28/17 123/87  12/17/16 (!) 159/48   Wt Readings from Last 3 Encounters:  02/11/17 114 lb (51.7 kg)  01/28/17 (!) 1125 lb (510.3 kg)  12/17/16 114 lb (51.7 kg)    Physical Exam  Constitutional: No distress.  Cardiovascular: Normal rate and normal heart sounds.  An irregularly irregular rhythm present.  Pulmonary/Chest: Effort normal and breath sounds normal.  Abdominal: Soft. She exhibits no distension. There is no tenderness.  Neurological: She is alert.  Patient with slow deliberate gait using a cane, 5/5 strength bilateral quads, hamstrings, plantar flexion, and dorsiflexion, sensation to light touch intact bilaterally extremity  Skin: Skin is warm and dry. She is not diaphoretic.      Assessment/Plan: Please see individual problem list.  Atrial fibrillation ([place]) Rate controlled. No bleeding noted. Follow with cardiology.  Chronic systolic heart failure ([facility]) Weight stable. Asymptomatic. Continue to monitor.  Dysuria UA and symptoms concerning for UTI. We'll treat with Keflex. Send urine for culture and microscopy.  Recurrent major depressive disorder, in partial remission ([facility]) Mood stable. Mostly related to her daughter-in-law is doing. She'll continue to monitor as she does not want any medications.  History of fall Patient with history of falls and unsteadiness. No recent falls. She does move quite deliberately when walking feel that she'll benefit from some physical therapy. She has a difficult time getting out of the house without assistance and thus we'll try home health physical therapy.   Orders Placed This Encounter  Procedures  . Ambulatory referral to Home Health    Referral Priority:   Routine    Referral Type:   Home Health Care    Referral Reason:   Specialty Services Required    Requested Specialty:   [place]    Number of Visits Requested:   1  . POCT Urinalysis Dipstick    Meds ordered this encounter  Medications  . cephALEXin (KEFLEX) 500 MG capsule    Sig: Take 1 capsule (500 mg total) by mouth 2 (two) times daily.    Dispense:  14 capsule    Refill:  0   Amanda Rumps, MD [place]

## 2017-02-11 NOTE — Assessment & Plan Note (Addendum)
Mood stable. Mostly related to her daughter-in-law is doing. She'll continue to monitor as she does not want any medications.

## 2017-02-11 NOTE — Assessment & Plan Note (Signed)
Rate controlled. No bleeding noted. Follow with cardiology.

## 2017-02-11 NOTE — Addendum Note (Signed)
Addended by: Leeanne Rio on: 02/11/2017 05:03 PM   Modules accepted: Orders

## 2017-02-11 NOTE — Patient Instructions (Addendum)
Nice to see you. We'll check your urine and contact you with the results.

## 2017-02-11 NOTE — Assessment & Plan Note (Signed)
Patient with history of falls and unsteadiness. No recent falls. She does move quite deliberately when walking feel that she'll benefit from some physical therapy. She has a difficult time getting out of the house without assistance and thus we'll try home health physical therapy.

## 2017-02-11 NOTE — Assessment & Plan Note (Signed)
Weight stable. Asymptomatic. Continue to monitor.

## 2017-02-12 LAB — URINALYSIS, MICROSCOPIC ONLY: RBC / HPF: NONE SEEN (ref 0–?)

## 2017-02-12 NOTE — Addendum Note (Signed)
Addended by: Arby Barrette on: 02/12/2017 07:30 AM   Modules accepted: Orders

## 2017-02-14 LAB — URINE CULTURE

## 2017-02-15 ENCOUNTER — Telehealth: Payer: Self-pay | Admitting: Family Medicine

## 2017-02-15 DIAGNOSIS — I110 Hypertensive heart disease with heart failure: Secondary | ICD-10-CM | POA: Diagnosis not present

## 2017-02-15 DIAGNOSIS — I5022 Chronic systolic (congestive) heart failure: Secondary | ICD-10-CM | POA: Diagnosis not present

## 2017-02-15 DIAGNOSIS — M5116 Intervertebral disc disorders with radiculopathy, lumbar region: Secondary | ICD-10-CM | POA: Diagnosis not present

## 2017-02-15 DIAGNOSIS — M169 Osteoarthritis of hip, unspecified: Secondary | ICD-10-CM | POA: Diagnosis not present

## 2017-02-15 DIAGNOSIS — M47816 Spondylosis without myelopathy or radiculopathy, lumbar region: Secondary | ICD-10-CM | POA: Diagnosis not present

## 2017-02-15 NOTE — Telephone Encounter (Signed)
Noted  

## 2017-02-15 NOTE — Telephone Encounter (Signed)
fyi

## 2017-02-15 NOTE — Telephone Encounter (Signed)
Lemuel from [facility] called and wanted to report pt was supposed to have start of care yesterday, but pt requested to start today.   Call [facility] @ [phone]

## 2017-02-18 ENCOUNTER — Telehealth: Payer: Self-pay | Admitting: *Deleted

## 2017-02-18 NOTE — Telephone Encounter (Signed)
Verbal orders given  Patient notified and states she does feel better

## 2017-02-18 NOTE — Telephone Encounter (Signed)
You cangive verbal orders for this. Thanks.  Please also contact the patient o the lefther know that her urine culture did reveal an infection that should be sensitive to the medication she is on. Please see if she is feeling better. Thanks.

## 2017-02-18 NOTE — Telephone Encounter (Signed)
Amanda Soto from [facility] stated that pt has leg weakness and balance issues. He requested home health physical therapy with the frequency of 1 x 1, 2x2, 1x 1  Contact [facility] [phone]

## 2017-02-18 NOTE — Telephone Encounter (Signed)
Please advise 

## 2017-02-20 DIAGNOSIS — M47816 Spondylosis without myelopathy or radiculopathy, lumbar region: Secondary | ICD-10-CM | POA: Diagnosis not present

## 2017-02-20 DIAGNOSIS — I5022 Chronic systolic (congestive) heart failure: Secondary | ICD-10-CM | POA: Diagnosis not present

## 2017-02-20 DIAGNOSIS — M5116 Intervertebral disc disorders with radiculopathy, lumbar region: Secondary | ICD-10-CM | POA: Diagnosis not present

## 2017-02-20 DIAGNOSIS — I110 Hypertensive heart disease with heart failure: Secondary | ICD-10-CM | POA: Diagnosis not present

## 2017-02-20 DIAGNOSIS — M169 Osteoarthritis of hip, unspecified: Secondary | ICD-10-CM | POA: Diagnosis not present

## 2017-02-22 ENCOUNTER — Telehealth: Payer: Self-pay | Admitting: Family Medicine

## 2017-02-22 DIAGNOSIS — I5022 Chronic systolic (congestive) heart failure: Secondary | ICD-10-CM | POA: Diagnosis not present

## 2017-02-22 DIAGNOSIS — M169 Osteoarthritis of hip, unspecified: Secondary | ICD-10-CM | POA: Diagnosis not present

## 2017-02-22 DIAGNOSIS — M5116 Intervertebral disc disorders with radiculopathy, lumbar region: Secondary | ICD-10-CM | POA: Diagnosis not present

## 2017-02-22 DIAGNOSIS — M47816 Spondylosis without myelopathy or radiculopathy, lumbar region: Secondary | ICD-10-CM | POA: Diagnosis not present

## 2017-02-22 DIAGNOSIS — I110 Hypertensive heart disease with heart failure: Secondary | ICD-10-CM | POA: Diagnosis not present

## 2017-02-22 NOTE — Telephone Encounter (Signed)
Lemuel from [facility] called and needs confirmation on pt's dx. He would like them faxed over the office at [phone].  Call [facility] @ [phone]

## 2017-02-22 NOTE — Telephone Encounter (Signed)
faxed

## 2017-02-22 NOTE — Telephone Encounter (Signed)
Please advise 

## 2017-02-22 NOTE — Telephone Encounter (Signed)
Please fax a copy of the problem list.

## 2017-02-26 DIAGNOSIS — I110 Hypertensive heart disease with heart failure: Secondary | ICD-10-CM | POA: Diagnosis not present

## 2017-02-26 DIAGNOSIS — M5116 Intervertebral disc disorders with radiculopathy, lumbar region: Secondary | ICD-10-CM | POA: Diagnosis not present

## 2017-02-26 DIAGNOSIS — M169 Osteoarthritis of hip, unspecified: Secondary | ICD-10-CM | POA: Diagnosis not present

## 2017-02-26 DIAGNOSIS — M47816 Spondylosis without myelopathy or radiculopathy, lumbar region: Secondary | ICD-10-CM | POA: Diagnosis not present

## 2017-02-26 DIAGNOSIS — I5022 Chronic systolic (congestive) heart failure: Secondary | ICD-10-CM | POA: Diagnosis not present

## 2017-02-28 DIAGNOSIS — I5022 Chronic systolic (congestive) heart failure: Secondary | ICD-10-CM | POA: Diagnosis not present

## 2017-02-28 DIAGNOSIS — M169 Osteoarthritis of hip, unspecified: Secondary | ICD-10-CM | POA: Diagnosis not present

## 2017-02-28 DIAGNOSIS — M47816 Spondylosis without myelopathy or radiculopathy, lumbar region: Secondary | ICD-10-CM | POA: Diagnosis not present

## 2017-02-28 DIAGNOSIS — I110 Hypertensive heart disease with heart failure: Secondary | ICD-10-CM | POA: Diagnosis not present

## 2017-02-28 DIAGNOSIS — M5116 Intervertebral disc disorders with radiculopathy, lumbar region: Secondary | ICD-10-CM | POA: Diagnosis not present

## 2017-03-02 ENCOUNTER — Other Ambulatory Visit: Payer: Self-pay | Admitting: Family Medicine

## 2017-03-03 ENCOUNTER — Telehealth: Payer: Self-pay | Admitting: Family Medicine

## 2017-03-05 NOTE — Telephone Encounter (Signed)
Defer to cardiology in sonnenbergs absence.

## 2017-03-05 NOTE — Telephone Encounter (Signed)
Please advise for refill, looks like cardiology filled prior from [facility]. thanks

## 2017-03-05 NOTE — Telephone Encounter (Signed)
Pt called requesting this refill. Pt only has 2 pills left. Please advise, thank you!  Pharmacy - [id number] - [place], [place] - [address]

## 2017-03-06 ENCOUNTER — Other Ambulatory Visit: Payer: Self-pay | Admitting: Family Medicine

## 2017-03-06 NOTE — Telephone Encounter (Signed)
Advised pt of the message below. She is trying to call Cardiology to see if she can get this refilled.

## 2017-03-07 ENCOUNTER — Ambulatory Visit: Payer: Medicare Other | Admitting: Podiatry

## 2017-03-07 DIAGNOSIS — I110 Hypertensive heart disease with heart failure: Secondary | ICD-10-CM | POA: Diagnosis not present

## 2017-03-07 DIAGNOSIS — I5022 Chronic systolic (congestive) heart failure: Secondary | ICD-10-CM | POA: Diagnosis not present

## 2017-03-07 DIAGNOSIS — M47816 Spondylosis without myelopathy or radiculopathy, lumbar region: Secondary | ICD-10-CM | POA: Diagnosis not present

## 2017-03-07 DIAGNOSIS — M5116 Intervertebral disc disorders with radiculopathy, lumbar region: Secondary | ICD-10-CM | POA: Diagnosis not present

## 2017-03-07 DIAGNOSIS — M169 Osteoarthritis of hip, unspecified: Secondary | ICD-10-CM | POA: Diagnosis not present

## 2017-03-13 DIAGNOSIS — M5116 Intervertebral disc disorders with radiculopathy, lumbar region: Secondary | ICD-10-CM | POA: Diagnosis not present

## 2017-03-13 DIAGNOSIS — M47816 Spondylosis without myelopathy or radiculopathy, lumbar region: Secondary | ICD-10-CM | POA: Diagnosis not present

## 2017-03-13 DIAGNOSIS — M169 Osteoarthritis of hip, unspecified: Secondary | ICD-10-CM | POA: Diagnosis not present

## 2017-03-13 DIAGNOSIS — I110 Hypertensive heart disease with heart failure: Secondary | ICD-10-CM | POA: Diagnosis not present

## 2017-03-13 DIAGNOSIS — I5022 Chronic systolic (congestive) heart failure: Secondary | ICD-10-CM | POA: Diagnosis not present

## 2017-03-19 ENCOUNTER — Other Ambulatory Visit: Payer: Self-pay

## 2017-03-19 NOTE — Telephone Encounter (Signed)
[facility] also requested   eliquis 03/06/17 60 11rf Atorvastatin last filled 07/27/16 90 2rf Furosemide last filled 03/05/17 30 0rf

## 2017-03-19 NOTE — Telephone Encounter (Signed)
[facility] is requesting refill on:  Tramadol patient sees pain management Sotalol unknown what dose, filed under historical Synthroid last filed 01/21/17 90 1rf

## 2017-03-20 ENCOUNTER — Telehealth: Payer: Self-pay | Admitting: *Deleted

## 2017-03-20 MED ORDER — APIXABAN 2.5 MG PO TABS: 2.5000 mg | ORAL_TABLET | Freq: Two times a day (BID) | ORAL | 3 refills | Status: DC

## 2017-03-20 MED ORDER — ATORVASTATIN CALCIUM 10 MG PO TABS: 10.0000 mg | ORAL_TABLET | Freq: Every day | ORAL | 2 refills | Status: DC

## 2017-03-20 MED ORDER — TRAMADOL HCL ER 300 MG PO TB24: 300.0000 mg | ORAL_TABLET | Freq: Every day | ORAL | 1 refills | Status: DC

## 2017-03-20 MED ORDER — FUROSEMIDE 20 MG PO TABS: 20.0000 mg | ORAL_TABLET | Freq: Every day | ORAL | 1 refills | Status: DC

## 2017-03-20 MED ORDER — LEVOTHYROXINE SODIUM 50 MCG PO TABS: 50.0000 ug | ORAL_TABLET | Freq: Every day | ORAL | 1 refills | Status: DC

## 2017-03-20 NOTE — Telephone Encounter (Signed)
Notified patient that she will have to have the cardiologist fill the sotalol, patient states she needs a refill on tramadol which you have been prescribing she states the 300 mg time release tramadol please advise

## 2017-03-20 NOTE — Telephone Encounter (Signed)
Refill given. Please fax. 

## 2017-03-20 NOTE — Telephone Encounter (Signed)
Patient notified

## 2017-03-20 NOTE — Telephone Encounter (Signed)
faxed

## 2017-03-20 NOTE — Telephone Encounter (Signed)
Refills given. Patient needs to contact cardiology for her sotalol refills. Tramadol to come from pain management as well.

## 2017-03-20 NOTE — Telephone Encounter (Signed)
Prescription printed

## 2017-03-20 NOTE — Telephone Encounter (Signed)
Pt requested a medication refill for Eliquis, synthroid and sotalol Pharmacy [facility]

## 2017-03-20 NOTE — Telephone Encounter (Signed)
Patient would like all her meds to be sent to [facility] please print 90 day

## 2017-03-25 DIAGNOSIS — M545 Low back pain: Secondary | ICD-10-CM | POA: Diagnosis not present

## 2017-03-25 DIAGNOSIS — I480 Paroxysmal atrial fibrillation: Secondary | ICD-10-CM | POA: Diagnosis not present

## 2017-03-25 DIAGNOSIS — R5383 Other fatigue: Secondary | ICD-10-CM | POA: Diagnosis not present

## 2017-03-25 DIAGNOSIS — I499 Cardiac arrhythmia, unspecified: Secondary | ICD-10-CM | POA: Diagnosis not present

## 2017-03-25 DIAGNOSIS — R001 Bradycardia, unspecified: Secondary | ICD-10-CM | POA: Diagnosis not present

## 2017-04-02 ENCOUNTER — Other Ambulatory Visit: Payer: Self-pay | Admitting: Family Medicine

## 2017-04-04 ENCOUNTER — Encounter: Payer: Self-pay | Admitting: Nurse Practitioner

## 2017-04-04 ENCOUNTER — Ambulatory Visit: Payer: Medicare Other | Attending: Nurse Practitioner | Admitting: Nurse Practitioner

## 2017-04-04 VITALS — BP 121/69 | HR 72 | Temp 98.0°F | Ht 59.5 in | Wt 114.0 lb

## 2017-04-04 DIAGNOSIS — R197 Diarrhea, unspecified: Secondary | ICD-10-CM | POA: Insufficient documentation

## 2017-04-04 DIAGNOSIS — M533 Sacrococcygeal disorders, not elsewhere classified: Secondary | ICD-10-CM | POA: Diagnosis not present

## 2017-04-04 DIAGNOSIS — D649 Anemia, unspecified: Secondary | ICD-10-CM | POA: Diagnosis not present

## 2017-04-04 DIAGNOSIS — I5022 Chronic systolic (congestive) heart failure: Secondary | ICD-10-CM | POA: Insufficient documentation

## 2017-04-04 DIAGNOSIS — G8918 Other acute postprocedural pain: Secondary | ICD-10-CM | POA: Diagnosis not present

## 2017-04-04 DIAGNOSIS — M546 Pain in thoracic spine: Secondary | ICD-10-CM | POA: Insufficient documentation

## 2017-04-04 DIAGNOSIS — I4891 Unspecified atrial fibrillation: Secondary | ICD-10-CM | POA: Diagnosis not present

## 2017-04-04 DIAGNOSIS — R001 Bradycardia, unspecified: Secondary | ICD-10-CM | POA: Insufficient documentation

## 2017-04-04 DIAGNOSIS — R0602 Shortness of breath: Secondary | ICD-10-CM | POA: Insufficient documentation

## 2017-04-04 DIAGNOSIS — E785 Hyperlipidemia, unspecified: Secondary | ICD-10-CM | POA: Insufficient documentation

## 2017-04-04 DIAGNOSIS — G894 Chronic pain syndrome: Secondary | ICD-10-CM | POA: Diagnosis not present

## 2017-04-04 DIAGNOSIS — K219 Gastro-esophageal reflux disease without esophagitis: Secondary | ICD-10-CM | POA: Insufficient documentation

## 2017-04-04 DIAGNOSIS — H3530 Unspecified macular degeneration: Secondary | ICD-10-CM | POA: Diagnosis not present

## 2017-04-04 DIAGNOSIS — I219 Acute myocardial infarction, unspecified: Secondary | ICD-10-CM | POA: Diagnosis not present

## 2017-04-04 DIAGNOSIS — Z881 Allergy status to other antibiotic agents status: Secondary | ICD-10-CM | POA: Insufficient documentation

## 2017-04-04 DIAGNOSIS — I110 Hypertensive heart disease with heart failure: Secondary | ICD-10-CM | POA: Insufficient documentation

## 2017-04-04 DIAGNOSIS — M5124 Other intervertebral disc displacement, thoracic region: Secondary | ICD-10-CM | POA: Insufficient documentation

## 2017-04-04 DIAGNOSIS — F339 Major depressive disorder, recurrent, unspecified: Secondary | ICD-10-CM | POA: Diagnosis not present

## 2017-04-04 DIAGNOSIS — E039 Hypothyroidism, unspecified: Secondary | ICD-10-CM | POA: Diagnosis not present

## 2017-04-04 DIAGNOSIS — M47816 Spondylosis without myelopathy or radiculopathy, lumbar region: Secondary | ICD-10-CM | POA: Diagnosis not present

## 2017-04-04 DIAGNOSIS — M1610 Unilateral primary osteoarthritis, unspecified hip: Secondary | ICD-10-CM | POA: Insufficient documentation

## 2017-04-04 DIAGNOSIS — M545 Low back pain: Secondary | ICD-10-CM | POA: Diagnosis not present

## 2017-04-04 DIAGNOSIS — M419 Scoliosis, unspecified: Secondary | ICD-10-CM | POA: Diagnosis not present

## 2017-04-04 DIAGNOSIS — N390 Urinary tract infection, site not specified: Secondary | ICD-10-CM | POA: Diagnosis not present

## 2017-04-04 DIAGNOSIS — F419 Anxiety disorder, unspecified: Secondary | ICD-10-CM | POA: Diagnosis not present

## 2017-04-04 DIAGNOSIS — R739 Hyperglycemia, unspecified: Secondary | ICD-10-CM | POA: Diagnosis not present

## 2017-04-04 DIAGNOSIS — G8929 Other chronic pain: Secondary | ICD-10-CM | POA: Diagnosis not present

## 2017-04-04 DIAGNOSIS — M810 Age-related osteoporosis without current pathological fracture: Secondary | ICD-10-CM | POA: Diagnosis not present

## 2017-04-04 DIAGNOSIS — M4316 Spondylolisthesis, lumbar region: Secondary | ICD-10-CM | POA: Insufficient documentation

## 2017-04-04 MED ORDER — LEVOTHYROXINE SODIUM 50 MCG PO TABS: 50.0000 ug | ORAL_TABLET | Freq: Every day | ORAL | 1 refills | Status: DC

## 2017-04-04 NOTE — Progress Notes (Signed)
Nursing Pain Medication Assessment:  Safety precautions to be maintained throughout the outpatient stay will include: orient to surroundings, keep bed in low position, maintain call bell within reach at all times, provide assistance with transfer out of bed and ambulation.  Medication Inspection Compliance: Amanda Soto did not comply with our request to bring her pills to be counted. She was reminded that bringing the medication bottles, even when empty, is a requirement.  Medication: None brought in. Pill/Patch Count: None available to be counted. Bottle Appearance: No container available. Did not bring bottle(s) to appointment. Filled Date: N/A Last Medication intake:  Today

## 2017-04-04 NOTE — Patient Instructions (Signed)
GENERAL RISKS AND COMPLICATIONS  What are the risk, side effects and possible complications? Generally speaking, most procedures are safe.  However, with any procedure there are risks, side effects, and the possibility of complications.  The risks and complications are dependent upon the sites that are lesioned, or the type of nerve block to be performed.  The closer the procedure is to the spine, the more serious the risks are.  Great care is taken when placing the radio frequency needles, block needles or lesioning probes, but sometimes complications can occur. 1. Infection: Any time there is an injection through the skin, there is a risk of infection.  This is why sterile conditions are used for these blocks.  There are four possible types of infection. 1. Localized skin infection. 2. Central Nervous System Infection-This can be in the form of Meningitis, which can be deadly. 3. Epidural Infections-This can be in the form of an epidural abscess, which can cause pressure inside of the spine, causing compression of the spinal cord with subsequent paralysis. This would require an emergency surgery to decompress, and there are no guarantees that the patient would recover from the paralysis. 4. Discitis-This is an infection of the intervertebral discs.  It occurs in about 1% of discography procedures.  It is difficult to treat and it may lead to surgery.        2. Pain: the needles have to go through skin and soft tissues, will cause soreness.       3. Damage to internal structures:  The nerves to be lesioned may be near blood vessels or    other nerves which can be potentially damaged.       4. Bleeding: Bleeding is more common if the patient is taking blood thinners such as  aspirin, Coumadin, Ticiid, Plavix, etc., or if he/she have some genetic predisposition  such as hemophilia. Bleeding into the spinal canal can cause compression of the spinal  cord with subsequent paralysis.  This would require an  emergency surgery to  decompress and there are no guarantees that the patient would recover from the  paralysis.       5. Pneumothorax:  Puncturing of a lung is a possibility, every time a needle is introduced in  the area of the chest or upper back.  Pneumothorax refers to free air around the  collapsed lung(s), inside of the thoracic cavity (chest cavity).  Another two possible  complications related to a similar event would include: Hemothorax and Chylothorax.   These are variations of the Pneumothorax, where instead of air around the collapsed  lung(s), you may have blood or chyle, respectively.       6. Spinal headaches: They may occur with any procedures in the area of the spine.       7. Persistent CSF (Cerebro-Spinal Fluid) leakage: This is a rare problem, but may occur  with prolonged intrathecal or epidural catheters either due to the formation of a fistulous  track or a dural tear.       8. Nerve damage: By working so close to the spinal cord, there is always a possibility of  nerve damage, which could be as serious as a permanent spinal cord injury with  paralysis.       9. Death:  Although rare, severe deadly allergic reactions known as "Anaphylactic  reaction" can occur to any of the medications used.      10. Worsening of the symptoms:  We can always make thing worse.    What are the chances of something like this happening? Chances of any of this occuring are extremely low.  By statistics, you have more of a chance of getting killed in a motor vehicle accident: while driving to the hospital than any of the above occurring .  Nevertheless, you should be aware that they are possibilities.  In general, it is similar to taking a shower.  Everybody knows that you can slip, hit your head and get killed.  Does that mean that you should not shower again?  Nevertheless always keep in mind that statistics do not mean anything if you happen to be on the wrong side of them.  Even if a procedure has a 1  (one) in a 1,000,000 (million) chance of going wrong, it you happen to be that one..Also, keep in mind that by statistics, you have more of a chance of having something go wrong when taking medications.  Who should not have this procedure? If you are on a blood thinning medication (e.g. Coumadin, Plavix, see list of "Blood Thinners"), or if you have an active infection going on, you should not have the procedure.  If you are taking any blood thinners, please inform your physician.  How should I prepare for this procedure?  Do not eat or drink anything at least six hours prior to the procedure.  Bring a driver with you .  It cannot be a taxi.  Come accompanied by an adult that can drive you back, and that is strong enough to help you if your legs get weak or numb from the local anesthetic.  Take all of your medicines the morning of the procedure with just enough water to swallow them.  If you have diabetes, make sure that you are scheduled to have your procedure done first thing in the morning, whenever possible.  If you have diabetes, take only half of your insulin dose and notify our nurse that you have done so as soon as you arrive at the clinic.  If you are diabetic, but only take blood sugar pills (oral hypoglycemic), then do not take them on the morning of your procedure.  You may take them after you have had the procedure.  Do not take aspirin or any aspirin-containing medications, at least eleven (11) days prior to the procedure.  They may prolong bleeding.  Wear loose fitting clothing that may be easy to take off and that you would not mind if it got stained with Betadine or blood.  Do not wear any jewelry or perfume  Remove any nail coloring.  It will interfere with some of our monitoring equipment.  NOTE: Remember that this is not meant to be interpreted as a complete list of all possible complications.  Unforeseen problems may occur.  BLOOD THINNERS The following drugs  contain aspirin or other products, which can cause increased bleeding during surgery and should not be taken for 2 weeks prior to and 1 week after surgery.  If you should need take something for relief of minor pain, you may take acetaminophen which is found in Tylenol,m Datril, Anacin-3 and Panadol. It is not blood thinner. The products listed below are.  Do not take any of the products listed below in addition to any listed on your instruction sheet.  A.P.C or A.P.C with Codeine Codeine Phosphate Capsules #3 Ibuprofen Ridaura  ABC compound Congesprin Imuran rimadil  Advil Cope Indocin Robaxisal  Alka-Seltzer Effervescent Pain Reliever and Antacid Coricidin or Coricidin-D  Indomethacin Rufen    Alka-Seltzer plus Cold Medicine Cosprin Ketoprofen S-A-C Tablets  Anacin Analgesic Tablets or Capsules Coumadin Korlgesic Salflex  Anacin Extra Strength Analgesic tablets or capsules CP-2 Tablets Lanoril Salicylate  Anaprox Cuprimine Capsules Levenox Salocol  Anexsia-D Dalteparin Magan Salsalate  Anodynos Darvon compound Magnesium Salicylate Sine-off  Ansaid Dasin Capsules Magsal Sodium Salicylate  Anturane Depen Capsules Marnal Soma  APF Arthritis pain formula Dewitt's Pills Measurin Stanback  Argesic Dia-Gesic Meclofenamic Sulfinpyrazone  Arthritis Bayer Timed Release Aspirin Diclofenac Meclomen Sulindac  Arthritis pain formula Anacin Dicumarol Medipren Supac  Analgesic (Safety coated) Arthralgen Diffunasal Mefanamic Suprofen  Arthritis Strength Bufferin Dihydrocodeine Mepro Compound Suprol  Arthropan liquid Dopirydamole Methcarbomol with Aspirin Synalgos  ASA tablets/Enseals Disalcid Micrainin Tagament  Ascriptin Doan's Midol Talwin  Ascriptin A/D Dolene Mobidin Tanderil  Ascriptin Extra Strength Dolobid Moblgesic Ticlid  Ascriptin with Codeine Doloprin or Doloprin with Codeine Momentum Tolectin  Asperbuf Duoprin Mono-gesic Trendar  Aspergum Duradyne Motrin or Motrin IB Triminicin  Aspirin  plain, buffered or enteric coated Durasal Myochrisine Trigesic  Aspirin Suppositories Easprin Nalfon Trillsate  Aspirin with Codeine Ecotrin Regular or Extra Strength Naprosyn Uracel  Atromid-S Efficin Naproxen Ursinus  Auranofin Capsules Elmiron Neocylate Vanquish  Axotal Emagrin Norgesic Verin  Azathioprine Empirin or Empirin with Codeine Normiflo Vitamin E  Azolid Emprazil Nuprin Voltaren  Bayer Aspirin plain, buffered or children's or timed BC Tablets or powders Encaprin Orgaran Warfarin Sodium  Buff-a-Comp Enoxaparin Orudis Zorpin  Buff-a-Comp with Codeine Equegesic Os-Cal-Gesic   Buffaprin Excedrin plain, buffered or Extra Strength Oxalid   Bufferin Arthritis Strength Feldene Oxphenbutazone   Bufferin plain or Extra Strength Feldene Capsules Oxycodone with Aspirin   Bufferin with Codeine Fenoprofen Fenoprofen Pabalate or Pabalate-SF   Buffets II Flogesic Panagesic   Buffinol plain or Extra Strength Florinal or Florinal with Codeine Panwarfarin   Buf-Tabs Flurbiprofen Penicillamine   Butalbital Compound Four-way cold tablets Penicillin   Butazolidin Fragmin Pepto-Bismol   Carbenicillin Geminisyn Percodan   Carna Arthritis Reliever Geopen Persantine   Carprofen Gold's salt Persistin   Chloramphenicol Goody's Phenylbutazone   Chloromycetin Haltrain Piroxlcam   Clmetidine heparin Plaquenil   Cllnoril Hyco-pap Ponstel   Clofibrate Hydroxy chloroquine Propoxyphen         Before stopping any of these medications, be sure to consult the physician who ordered them.  Some, such as Coumadin (Warfarin) are ordered to prevent or treat serious conditions such as "deep thrombosis", "pumonary embolisms", and other heart problems.  The amount of time that you may need off of the medication may also vary with the medication and the reason for which you were taking it.  If you are taking any of these medications, please make sure you notify your pain physician before you undergo any  procedures.         Sacroiliac (SI) Joint Injection Patient Information  Description: The sacroiliac joint connects the scrum (very low back and tailbone) to the ilium (a pelvic bone which also forms half of the hip joint).  Normally this joint experiences very little motion.  When this joint becomes inflamed or unstable low back and or hip and pelvis pain may result.  Injection of this joint with local anesthetics (numbing medicines) and steroids can provide diagnostic information and reduce pain.  This injection is performed with the aid of x-ray guidance into the tailbone area while you are lying on your stomach.   You may experience an electrical sensation down the leg while this is being done.  You may also experience numbness.  We   also may ask if we are reproducing your normal pain during the injection.  Conditions which may be treated SI injection:   Low back, buttock, hip or leg pain  Preparation for the Injection:  1. Do not eat any solid food or dairy products within 8 hours of your appointment.  2. You may drink clear liquids up to 3 hours before appointment.  Clear liquids include water, black coffee, juice or soda.  No milk or cream please. 3. You may take your regular medications, including pain medications with a sip of water before your appointment.  Diabetics should hold regular insulin (if take separately) and take 1/2 normal NPH dose the morning of the procedure.  Carry some sugar containing items with you to your appointment. 4. A driver must accompany you and be prepared to drive you home after your procedure. 5. Bring all of your current medications with you. 6. An IV may be inserted and sedation may be given at the discretion of the physician. 7. A blood pressure cuff, EKG and other monitors will often be applied during the procedure.  Some patients may need to have extra oxygen administered for a short period.  8. You will be asked to provide medical information,  including your allergies, prior to the procedure.  We must know immediately if you are taking blood thinners (like Coumadin/Warfarin) or if you are allergic to IV iodine contrast (dye).  We must know if you could possible be pregnant.  Possible side effects:   Bleeding from needle site  Infection (rare, may require surgery)  Nerve injury (rare)  Numbness & tingling (temporary)  A brief convulsion or seizure  Light-headedness (temporary)  Pain at injection site (several days)  Decreased blood pressure (temporary)  Weakness in the leg (temporary)   Call if you experience:   New onset weakness or numbness of an extremity below the injection site that last more than 8 hours.  Hives or difficulty breathing ( go to the emergency room)  Inflammation or drainage at the injection site  Any new symptoms which are concerning to you  Please note:  Although the local anesthetic injected can often make your back/ hip/ buttock/ leg feel good for several hours after the injections, the pain will likely return.  It takes 3-7 days for steroids to work in the sacroiliac area.  You may not notice any pain relief for at least that one week.  If effective, we will often do a series of three injections spaced 3-6 weeks apart to maximally decrease your pain.  After the initial series, we generally will wait some months before a repeat injection of the same type.  If you have any questions, please call [phone] [facility]   

## 2017-04-04 NOTE — Telephone Encounter (Signed)
Patient received generic but only uses brand name, rx sent

## 2017-04-04 NOTE — Progress Notes (Signed)
Patient's Name: Amanda Soto  MRN: [id number]  Referring Provider: [name], MD  DOB: 07-05-1924  PCP: [name], MD  DOS: 04/04/2017  Note by: Vevelyn Francois NP  Service setting: Ambulatory outpatient  Specialty: Interventional Pain Management  Location: [facility] (AMB) Pain Management Facility    Patient type: Established    Primary Reason(s) for Visit: Evaluation of chronic illnesses with exacerbation, or progression (Level of risk: moderate) CC: Back Pain (mid and right)  HPI  Ms. Tall is a 82 y.o. year old, female patient, who comes today for a follow-up evaluation. She has Chronic systolic heart failure ([place]); Bradycardia; Atrial fibrillation ([place]); HTN (hypertension); Abdominal discomfort; Exertional shortness of breath; Elevated glucose; Hypothyroidism; Lightheadedness; Symptomatic anemia; Anemia; Anxiety; Degeneration of intervertebral disc of lumbar region; Recurrent major depressive disorder, in partial remission ([place]); Cardiac murmur; H/O neoplasm; HLD (hyperlipidemia); MI (mitral incompetence); OP (osteoporosis); Chronic low back pain (Location of Primary Source of Pain) (Right); Lumbar facet syndrome (Location of Primary Source of Pain) (Right); Grade 1 Anterolisthesis of L4 over L5 (5 mm); Lumbar spondylosis; Scoliosis of lumbar spine (concave to right side); Lumbar facet arthropathy; Osteoarthritis of hip (Right); Thoracic paracentral T7-8 disc protrusion; Depression; UTI (lower urinary tract infection); Bruising; Diarrhea; Macular degeneration; Chronic pain syndrome; Tiredness; Closed Colles' fracture; Acute postoperative pain; Dysuria; History of fall; and Chronic sacroiliac joint pain on her problem list. [name] was last seen on Visit date not found. Her primarily concern today is the Back Pain (mid and right)  Pain Assessment: Location: Mid, Right Back Radiating: denies Onset: 1 to 4 weeks ago Duration: Chronic pain Quality: Constant, Dull, Sharp,  Stabbing Severity: 10-Worst pain ever/10 (self-reported pain score)  Note: Reported level is compatible with observation.                    Effect on ADL:   Timing: Constant Modifying factors: medications,rest  Further details on both, my assessment(s), as well as the proposed treatment plan, please see below. She is SP right Lumbar facet RFA and had 90-100% relief. She admits that approximately one week ago her back pain returned. She denies any fall or injury. She remembers going to the doctor and several other places. She denies any radiating pain.   Laboratory Chemistry  Inflammation Markers (CRP: Acute Phase) (ESR: Chronic Phase) Lab Results  Component Value Date   ESRSEDRATE 18 07/25/2016                 Renal Function Markers Lab Results  Component Value Date   BUN 14 11/02/2016   CREATININE 0.67 11/02/2016   GFRAA >60 11/10/2015   GFRNONAA >60 11/10/2015                 Hepatic Function Markers Lab Results  Component Value Date   AST 28 11/02/2016   ALT 18 11/02/2016   ALBUMIN 3.9 11/02/2016   ALKPHOS 56 11/02/2016                 Electrolytes Lab Results  Component Value Date   NA 139 11/02/2016   K 4.2 11/02/2016   CL 103 11/02/2016   CALCIUM 9.3 11/02/2016   MG 2.1 11/10/2015                 Neuropathy Markers Lab Results  Component Value Date   VITAMINB12 227 11/10/2015                 Bone Pathology Markers Lab  Results  Component Value Date   ALKPHOS 56 11/02/2016   CALCIUM 9.3 11/02/2016                 Coagulation Parameters Lab Results  Component Value Date   INR 1.3 02/05/2014   LABPROT 15.5 (H) 02/05/2014   APTT < 23.0 (L) 02/05/2014   PLT 229 11/02/2016                 Cardiovascular Markers Lab Results  Component Value Date   HGB 13.6 11/02/2016   HCT 42.6 11/02/2016                 Note: Lab results reviewed.  Recent Diagnostic Imaging Review    Thoracic Imaging:  Results for orders placed in visit on 07/18/07  [place] W/O Cm   Narrative * PRIOR REPORT IMPORTED FROM AN EXTERNAL SYSTEM *   PRIOR REPORT IMPORTED FROM THE [facility] [place] EXAM:    right flank pain  COMMENTS:   PROCEDURE:     MR  - MR THORACIC SPINE WO  - Jul 18 2007 11:24AM   RESULT:   HISTORY:  RIGHT flank pain.   COMPARISON STUDIES:   No prior.   PROCEDURE AND FINDINGS:  Multiplanar/multisequence imaging of the thoracic  spine was obtained.  A tiny mid to upper thoracic spine disc protrusion is  present. It is central to RIGHT paracentral with mild flattening of the  thecal sac. No evidence of thoracic cord distortion or significant spinal  stenosis.  This appears to be at T7-T8. No other focal disc protrusions  are  noted. There is no evidence of thoracic spinal stenosis.  The thoracic  cord  is normal.  No bony abnormality is identified.   IMPRESSION:   RIGHT paracentral small T7-T8 disc protrusion.  No significant spinal  stenosis or thoracic cord deformity noted.   Thank you for the opportunity to contribute to the care of your patient.       Lumbosacral Imaging:  Lumbar MR wo contrast:  Results for orders placed in visit on 10/24/12  [facility] W/O Cm   Narrative * PRIOR REPORT IMPORTED FROM AN EXTERNAL SYSTEM *   PRIOR REPORT IMPORTED FROM THE [facility]   REASON FOR EXAM:    Lumbar spondylosis  COMMENTS:   PROCEDURE:     MR  - MR LUMBAR SPINE WO CONTRAST  - Oct 24 2012  1:11PM   RESULT:     Comparison is made to a prior study dated 12/19/2010.   Technique: Multiplanar and multisequence imaging of the lumbar spine was  obtained without the administration of gadolinium. The conus medullaris  terminates at a T12-L1 level. The cauda equina demonstrate no evidence of  clumping nor thickening.   At the T12-L1, L1-L2, L2-L3 levels, there is no evidence of significant  thecal sac stenosis nor neural foraminal narrowing.   At the L3-L4, L4-L5 and L5-S1 levels  subligamentous disc bulges are  appreciated without evidence of thecal sac stenosis or neural foraminal  narrowing.   The osseous structures demonstrate no evidence of marrow edema.   IMPRESSION:   1. Multilevel mild degenerative disc disease changes without evidence of  thecal sac stenosis or neural foraminal narrowing. These findings are  unchanged when compared to previous study.   Thank you for the opportunity to contribute to the care of your patient.       Lumbar DG  Bending views:  Results for orders placed during the hospital encounter of 11/14/15  DG Lumbar Spine Complete W/Bend   Narrative CLINICAL DATA:  Back pain.  EXAM: LUMBAR SPINE - COMPLETE WITH BENDING VIEWS  COMPARISON:  Bone scan 12/09/2014.  CT 11/02/2013.  FINDINGS: Degenerative changes lumbar spinal scoliosis concave right. 5 mm anterolisthesis L4 on L5. No acute bony abnormality . Aortoiliac atherosclerotic vascular disease. Renal vascular disease.  IMPRESSION: 1. Diffuse degenerative change lumbar spine with 5 mm anterolisthesis L4 on L5. Diffuse osteopenia. No acute bony abnormality.  2. Aortoiliac atherosclerotic vascular disease. Renal vascular disease .   Electronically Signed   By: Marcello Moores  Register   On: 11/15/2015 08:52    Complexity Note: Imaging results reviewed. Results shared with Ms. Slutsky, using Layman's terms.                         Meds   Current Outpatient Prescriptions:  .  acetaminophen (TYLENOL) 325 MG tablet, Take 650 mg by mouth every 6 (six) hours as needed for moderate pain, fever or headache., Disp: , Rfl:  .  apixaban (ELIQUIS) 2.5 MG TABS tablet, Take 1 tablet (2.5 mg total) by mouth 2 (two) times daily., Disp: 180 tablet, Rfl: 3 .  atorvastatin (LIPITOR) 10 MG tablet, Take 1 tablet (10 mg total) by mouth daily., Disp: 90 tablet, Rfl: 2 .  docusate sodium (COLACE) 100 MG capsule, Take by mouth 2 (two) times daily. , Disp: , Rfl:  .  furosemide (LASIX) 20 MG  tablet, Take 1 tablet (20 mg total) by mouth daily., Disp: 90 tablet, Rfl: 1 .  iron polysaccharides (NIFEREX) 150 MG capsule, Take 1 capsule (150 mg total) by mouth daily., Disp: 30 capsule, Rfl: 2 .  levothyroxine (SYNTHROID) 50 MCG tablet, Take 1 tablet (50 mcg total) by mouth daily before breakfast., Disp: 90 tablet, Rfl: 1 .  Multiple Vitamin (MULTIVITAMIN) capsule, Take by mouth., Disp: , Rfl:  .  sotalol (BETAPACE) 80 MG tablet, Take 40 mg by mouth 2 (two) times daily. , Disp: , Rfl:  .  traMADol (ULTRAM-ER) 300 MG 24 hr tablet, Take 1 tablet (300 mg total) by mouth daily., Disp: 90 tablet, Rfl: 1 .  cephALEXin (KEFLEX) 500 MG capsule, Take 1 capsule (500 mg total) by mouth 2 (two) times daily. (Patient not taking: Reported on 04/04/2017), Disp: 14 capsule, Rfl: 0 .  Difluprednate (DUREZOL) 0.05 % EMUL, Frequency:PHARMDIR   Dosage:0.0     Instructions:  Note:One drop four times daily in the operative eye. Dose: 0.05 %, Disp: , Rfl:  .  furosemide (LASIX) 20 MG tablet, TAKE 1 TABLET BY MOUTH EVERY DAY (Patient not taking: Reported on 04/04/2017), Disp: 30 tablet, Rfl: 0 .  omeprazole (PRILOSEC) 20 MG capsule, Take 20 mg by mouth daily., Disp: , Rfl:   ROS  Constitutional: Denies any fever or chills Gastrointestinal: No reported hemesis, hematochezia, vomiting, or acute GI distress Musculoskeletal: Denies any acute onset joint swelling, redness, loss of ROM, or weakness Neurological: No reported episodes of acute onset apraxia, aphasia, dysarthria, agnosia, amnesia, paralysis, loss of coordination, or loss of consciousness  Allergies  Ms. Nordquist is allergic to hydrocodone; ciprofloxacin; and latex.  PFSH  Drug: Ms. Abdulla  reports that she does not use drugs. Alcohol:  reports that she does not drink alcohol. Tobacco:  reports that she has never smoked. She has never used smokeless tobacco. Medical:  has a past medical history of Arthritis; Atrial fibrillation (  [place]); CHF (congestive heart  failure) ([place]); Chickenpox; Chronic abdominal pain (01/16/2014); Degenerative arthritis of hip (08/19/2014); Degenerative arthritis of lumbar spine (11/18/2013); Depression; Diverticulitis; GERD (gastroesophageal reflux disease); Heart murmur; Hyperlipidemia; Hypertension; Hypothyroid; Neuritis or radiculitis due to rupture of lumbar intervertebral disc (11/18/2013); Skin cancer; and Trochanteric bursitis of right hip (11/18/2013). Surgical: Ms. Beaston  has a past surgical history that includes Cataract extraction; Partial hysterectomy; Appendectomy; and Tonsillectomy. Family: family history includes Breast cancer in her mother and sister; Heart disease in her unknown relative; Hypertension in her unknown relative; Stroke in her maternal grandmother.  Constitutional Exam  General appearance: Well nourished, well developed, and well hydrated. In no apparent acute distress Vitals:   04/04/17 1116  BP: 121/69  Pulse: 72  Temp: 98 F (36.7 C)  TempSrc: Oral  SpO2: 98%  Weight: 114 lb (51.7 kg)  Height: 4' 11.5" (1.511 m)   BMI Assessment: Estimated body mass index is 22.64 kg/m as calculated from the following:   Height as of this encounter: 4' 11.5" (1.511 m).   Weight as of this encounter: 114 lb (51.7 kg). Psych/Mental status: Alert, oriented x 3 (person, place, & time)       Eyes: PERLA Respiratory: No evidence of acute respiratory distress  Cervical Spine Area Exam  Skin & Axial Inspection: No masses, redness, edema, swelling, or associated skin lesions Alignment: Symmetrical Functional ROM: Unrestricted ROM      Stability: No instability detected Muscle Tone/Strength: Functionally intact. No obvious neuro-muscular anomalies detected. Sensory (Neurological): Unimpaired Palpation: No palpable anomalies              Upper Extremity (UE) Exam    Side: Right upper extremity  Side: Left upper extremity  Skin & Extremity Inspection: Skin color, temperature, and hair growth are WNL. No  peripheral edema or cyanosis. No masses, redness, swelling, asymmetry, or associated skin lesions. No contractures.  Skin & Extremity Inspection: Skin color, temperature, and hair growth are WNL. No peripheral edema or cyanosis. No masses, redness, swelling, asymmetry, or associated skin lesions. No contractures.  Functional ROM: Unrestricted ROM          Functional ROM: Unrestricted ROM          Muscle Tone/Strength: Functionally intact. No obvious neuro-muscular anomalies detected.  Muscle Tone/Strength: Functionally intact. No obvious neuro-muscular anomalies detected.  Sensory (Neurological): Unimpaired          Sensory (Neurological): Unimpaired          Palpation: No palpable anomalies              Palpation: No palpable anomalies              Specialized Test(s): Deferred         Specialized Test(s): Deferred          Thoracic Spine Area Exam  Skin & Axial Inspection: No masses, redness, or swelling Alignment: Symmetrical Functional ROM: Unrestricted ROM Stability: No instability detected Muscle Tone/Strength: Functionally intact. No obvious neuro-muscular anomalies detected. Sensory (Neurological): Unimpaired Muscle strength & Tone: No palpable anomalies  Lumbar Spine Area Exam  Skin & Axial Inspection: No masses, redness, or swelling Alignment: Symmetrical Functional ROM: Unrestricted ROM      Stability: No instability detected Muscle Tone/Strength: Functionally intact. No obvious neuro-muscular anomalies detected. Sensory (Neurological): Unimpaired Palpation: No palpable anomalies       Provocative Tests: Lumbar Hyperextension and rotation test: Positive on the right for facet joint pain. Lumbar Lateral bending test: evaluation deferred today  Patrick's Maneuver: Positive for right-sided S-I arthralgia              Gait & Posture Assessment  Ambulation: Unassisted Gait: Relatively normal for age and body habitus Posture: WNL   Lower Extremity Exam    Side: Right  lower extremity  Side: Left lower extremity  Skin & Extremity Inspection: Skin color, temperature, and hair growth are WNL. No peripheral edema or cyanosis. No masses, redness, swelling, asymmetry, or associated skin lesions. No contractures.  Skin & Extremity Inspection: Skin color, temperature, and hair growth are WNL. No peripheral edema or cyanosis. No masses, redness, swelling, asymmetry, or associated skin lesions. No contractures.  Functional ROM: Unrestricted ROM          Functional ROM: Unrestricted ROM          Muscle Tone/Strength: Functionally intact. No obvious neuro-muscular anomalies detected.  Muscle Tone/Strength: Functionally intact. No obvious neuro-muscular anomalies detected.  Sensory (Neurological): Unimpaired  Sensory (Neurological): Unimpaired  Palpation: No palpable anomalies  Palpation: No palpable anomalies   Assessment  Primary Diagnosis & Pertinent Problem List: The primary encounter diagnosis was Chronic sacroiliac joint pain. Diagnoses of Chronic low back pain (Location of Primary Source of Pain) (Right), Lumbar spondylosis, and Chronic pain syndrome were also pertinent to this visit.  Status Diagnosis  Controlled Controlled Controlled 1. Chronic sacroiliac joint pain   2. Chronic low back pain (Location of Primary Source of Pain) (Right)   3. Lumbar spondylosis   4. Chronic pain syndrome     Problems updated and reviewed during this visit: Problem  Chronic Sacroiliac Joint Pain   Plan of Care  Pharmacotherapy (Medications Ordered): No orders of the defined types were placed in this encounter.  New Prescriptions   No medications on file   Medications administered today: Ms. Malenfant had no medications administered during this visit. Lab-work, procedure(s), and/or referral(s): Orders Placed This Encounter  Procedures  . BLOCK OF NERVES TO THE SACROILIAC JOINT   Imaging and/or referral(s): None  Interventional therapies: Planned, scheduled, and/or  pending:   Stop Elaquis for 3 days then Right-sided SI joint injection    Considering:   Right-sided lumbar facet radiofrequency ablation under fluoroscopic guidance and IV sedation.    Palliative PRN treatment(s):   Palliative right-sided lumbar facet block under fluoroscopic guidance and IV sedation      Provider-requested follow-up: Return for w/ Dr. Dossie Arbour, Procedure(w/Sedation).  Future Appointments Date Time Provider [place]  04/09/2017 12:45 PM Milinda Pointer, MD [facility] None  06/04/2017 2:00 PM [name], MD [facility] None  07/26/2017 3:00 PM O'Brien-Blaney, Bryson Corona, LPN [facility] None   Primary Care Physician: [name], MD Location: [facility] Note by: Vevelyn Francois NP Date: 04/04/2017; Time: 2:10 PM  Pain Score Disclaimer: We use the NRS-11 scale. This is a self-reported, subjective measurement of pain severity with only modest accuracy. It is used primarily to identify changes within a particular patient. It must be understood that outpatient pain scales are significantly less accurate that those used for research, where they can be applied under ideal controlled circumstances with minimal exposure to variables. In reality, the score is likely to be a combination of pain intensity and pain affect, where pain affect describes the degree of emotional arousal or changes in action readiness caused by the sensory experience of pain. Factors such as social and work situation, setting, emotional state, anxiety levels, expectation, and prior pain experience may influence pain perception and show large  inter-individual differences that may also be affected by time variables.  Patient instructions provided during this appointment: Patient Instructions   GENERAL RISKS AND COMPLICATIONS  What are the risk, side effects and possible complications? Generally speaking, most procedures are safe.  However, with any  procedure there are risks, side effects, and the possibility of complications.  The risks and complications are dependent upon the sites that are lesioned, or the type of nerve block to be performed.  The closer the procedure is to the spine, the more serious the risks are.  Great care is taken when placing the radio frequency needles, block needles or lesioning probes, but sometimes complications can occur. 1. Infection: Any time there is an injection through the skin, there is a risk of infection.  This is why sterile conditions are used for these blocks.  There are four possible types of infection. 1. Localized skin infection. 2. Central Nervous System Infection-This can be in the form of Meningitis, which can be deadly. 3. Epidural Infections-This can be in the form of an epidural abscess, which can cause pressure inside of the spine, causing compression of the spinal cord with subsequent paralysis. This would require an emergency surgery to decompress, and there are no guarantees that the patient would recover from the paralysis. 4. Discitis-This is an infection of the intervertebral discs.  It occurs in about 1% of discography procedures.  It is difficult to treat and it may lead to surgery.        2. Pain: the needles have to go through skin and soft tissues, will cause soreness.       3. Damage to internal structures:  The nerves to be lesioned may be near blood vessels or    other nerves which can be potentially damaged.       4. Bleeding: Bleeding is more common if the patient is taking blood thinners such as  aspirin, Coumadin, Ticiid, Plavix, etc., or if he/she have some genetic predisposition  such as hemophilia. Bleeding into the spinal canal can cause compression of the spinal  cord with subsequent paralysis.  This would require an emergency surgery to  decompress and there are no guarantees that the patient would recover from the  paralysis.       5. Pneumothorax:  Puncturing of a lung is  a possibility, every time a needle is introduced in  the area of the chest or upper back.  Pneumothorax refers to free air around the  collapsed lung(s), inside of the thoracic cavity (chest cavity).  Another two possible  complications related to a similar event would include: Hemothorax and Chylothorax.   These are variations of the Pneumothorax, where instead of air around the collapsed  lung(s), you may have blood or chyle, respectively.       6. Spinal headaches: They may occur with any procedures in the area of the spine.       7. Persistent CSF (Cerebro-Spinal Fluid) leakage: This is a rare problem, but may occur  with prolonged intrathecal or epidural catheters either due to the formation of a fistulous  track or a dural tear.       8. Nerve damage: By working so close to the spinal cord, there is always a possibility of  nerve damage, which could be as serious as a permanent spinal cord injury with  paralysis.       9. Death:  Although rare, severe deadly allergic reactions known as "Anaphylactic  reaction" can occur to  any of the medications used.      10. Worsening of the symptoms:  We can always make thing worse.  What are the chances of something like this happening? Chances of any of this occuring are extremely low.  By statistics, you have more of a chance of getting killed in a motor vehicle accident: while driving to the hospital than any of the above occurring .  Nevertheless, you should be aware that they are possibilities.  In general, it is similar to taking a shower.  Everybody knows that you can slip, hit your head and get killed.  Does that mean that you should not shower again?  Nevertheless always keep in mind that statistics do not mean anything if you happen to be on the wrong side of them.  Even if a procedure has a 1 (one) in a 1,000,000 (million) chance of going wrong, it you happen to be that one..Also, keep in mind that by statistics, you have more of a chance of having  something go wrong when taking medications.  Who should not have this procedure? If you are on a blood thinning medication (e.g. Coumadin, Plavix, see list of "Blood Thinners"), or if you have an active infection going on, you should not have the procedure.  If you are taking any blood thinners, please inform your physician.  How should I prepare for this procedure?  Do not eat or drink anything at least six hours prior to the procedure.  Bring a driver with you .  It cannot be a taxi.  Come accompanied by an adult that can drive you back, and that is strong enough to help you if your legs get weak or numb from the local anesthetic.  Take all of your medicines the morning of the procedure with just enough water to swallow them.  If you have diabetes, make sure that you are scheduled to have your procedure done first thing in the morning, whenever possible.  If you have diabetes, take only half of your insulin dose and notify our nurse that you have done so as soon as you arrive at the clinic.  If you are diabetic, but only take blood sugar pills (oral hypoglycemic), then do not take them on the morning of your procedure.  You may take them after you have had the procedure.  Do not take aspirin or any aspirin-containing medications, at least eleven (11) days prior to the procedure.  They may prolong bleeding.  Wear loose fitting clothing that may be easy to take off and that you would not mind if it got stained with Betadine or blood.  Do not wear any jewelry or perfume  Remove any nail coloring.  It will interfere with some of our monitoring equipment.  NOTE: Remember that this is not meant to be interpreted as a complete list of all possible complications.  Unforeseen problems may occur.  BLOOD THINNERS The following drugs contain aspirin or other products, which can cause increased bleeding during surgery and should not be taken for 2 weeks prior to and 1 week after surgery.  If you  should need take something for relief of minor pain, you may take acetaminophen which is found in Tylenol,m Datril, Anacin-3 and Panadol. It is not blood thinner. The products listed below are.  Do not take any of the products listed below in addition to any listed on your instruction sheet.  A.P.C or A.P.C with Codeine Codeine Phosphate Capsules #3 Ibuprofen Ridaura  ABC  compound Congesprin Imuran rimadil  Advil Cope Indocin Robaxisal  Alka-Seltzer Effervescent Pain Reliever and Antacid Coricidin or Coricidin-D  Indomethacin Rufen  Alka-Seltzer plus Cold Medicine Cosprin Ketoprofen S-A-C Tablets  Anacin Analgesic Tablets or Capsules Coumadin Korlgesic Salflex  Anacin Extra Strength Analgesic tablets or capsules CP-2 Tablets Lanoril Salicylate  Anaprox Cuprimine Capsules Levenox Salocol  Anexsia-D Dalteparin Magan Salsalate  Anodynos Darvon compound Magnesium Salicylate Sine-off  Ansaid Dasin Capsules Magsal Sodium Salicylate  Anturane Depen Capsules Marnal Soma  APF Arthritis pain formula [facility] Measurin Stanback  Argesic Dia-Gesic Meclofenamic Sulfinpyrazone  Arthritis Bayer Timed Release Aspirin Diclofenac Meclomen Sulindac  Arthritis pain formula Anacin Dicumarol Medipren Supac  Analgesic (Safety coated) Arthralgen Diffunasal Mefanamic Suprofen  Arthritis Strength Bufferin Dihydrocodeine Mepro Compound Suprol  Arthropan liquid Dopirydamole Methcarbomol with Aspirin Synalgos  ASA tablets/Enseals Disalcid Micrainin Tagament  Ascriptin Doan's Midol Talwin  Ascriptin A/D Dolene Mobidin Tanderil  Ascriptin Extra Strength Dolobid Moblgesic Ticlid  Ascriptin with Codeine Doloprin or Doloprin with Codeine Momentum Tolectin  Asperbuf Duoprin Mono-gesic Trendar  Aspergum Duradyne Motrin or Motrin IB Triminicin  Aspirin plain, buffered or enteric coated Durasal Myochrisine Trigesic  Aspirin Suppositories Easprin Nalfon Trillsate  Aspirin with Codeine Ecotrin Regular or Extra Strength  Naprosyn Uracel  Atromid-S Efficin Naproxen Ursinus  Auranofin Capsules Elmiron Neocylate Vanquish  Axotal Emagrin Norgesic Verin  Azathioprine Empirin or Empirin with Codeine Normiflo Vitamin E  Azolid Emprazil Nuprin Voltaren  Bayer Aspirin plain, buffered or children's or timed BC Tablets or powders Encaprin Orgaran Warfarin Sodium  Buff-a-Comp Enoxaparin Orudis Zorpin  Buff-a-Comp with Codeine Equegesic Os-Cal-Gesic   Buffaprin Excedrin plain, buffered or Extra Strength Oxalid   Bufferin Arthritis Strength Feldene Oxphenbutazone   Bufferin plain or Extra Strength Feldene Capsules Oxycodone with Aspirin   Bufferin with Codeine Fenoprofen Fenoprofen Pabalate or Pabalate-SF   Buffets II Flogesic Panagesic   Buffinol plain or Extra Strength Florinal or Florinal with Codeine Panwarfarin   Buf-Tabs Flurbiprofen Penicillamine   Butalbital Compound Four-way cold tablets Penicillin   Butazolidin Fragmin Pepto-Bismol   Carbenicillin Geminisyn Percodan   Carna Arthritis Reliever Geopen Persantine   Carprofen Gold's salt Persistin   Chloramphenicol Goody's Phenylbutazone   Chloromycetin Haltrain Piroxlcam   Clmetidine heparin Plaquenil   Cllnoril Hyco-pap Ponstel   Clofibrate Hydroxy chloroquine Propoxyphen         Before stopping any of these medications, be sure to consult the physician who ordered them.  Some, such as Coumadin (Warfarin) are ordered to prevent or treat serious conditions such as "deep thrombosis", "pumonary embolisms", and other heart problems.  The amount of time that you may need off of the medication may also vary with the medication and the reason for which you were taking it.  If you are taking any of these medications, please make sure you notify your pain physician before you undergo any procedures.         Sacroiliac (SI) Joint Injection Patient Information  Description: The sacroiliac joint connects the scrum (very low back and tailbone) to the ilium (a  pelvic bone which also forms half of the hip joint).  Normally this joint experiences very little motion.  When this joint becomes inflamed or unstable low back and or hip and pelvis pain may result.  Injection of this joint with local anesthetics (numbing medicines) and steroids can provide diagnostic information and reduce pain.  This injection is performed with the aid of x-ray guidance into the tailbone area while you are lying on your stomach.  You may experience an electrical sensation down the leg while this is being done.  You may also experience numbness.  We also may ask if we are reproducing your normal pain during the injection.  Conditions which may be treated SI injection:   Low back, buttock, hip or leg pain  Preparation for the Injection:  1. Do not eat any solid food or dairy products within 8 hours of your appointment.  2. You may drink clear liquids up to 3 hours before appointment.  Clear liquids include water, black coffee, juice or soda.  No milk or cream please. 3. You may take your regular medications, including pain medications with a sip of water before your appointment.  Diabetics should hold regular insulin (if take separately) and take 1/2 normal NPH dose the morning of the procedure.  Carry some sugar containing items with you to your appointment. 4. A driver must accompany you and be prepared to drive you home after your procedure. 5. Bring all of your current medications with you. 6. An IV may be inserted and sedation may be given at the discretion of the physician. 7. A blood pressure cuff, EKG and other monitors will often be applied during the procedure.  Some patients may need to have extra oxygen administered for a short period.  8. You will be asked to provide medical information, including your allergies, prior to the procedure.  We must know immediately if you are taking blood thinners (like Coumadin/Warfarin) or if you are allergic to IV iodine contrast  (dye).  We must know if you could possible be pregnant.  Possible side effects:   Bleeding from needle site  Infection (rare, may require surgery)  Nerve injury (rare)  Numbness & tingling (temporary)  A brief convulsion or seizure  Light-headedness (temporary)  Pain at injection site (several days)  Decreased blood pressure (temporary)  Weakness in the leg (temporary)   Call if you experience:   New onset weakness or numbness of an extremity below the injection site that last more than 8 hours.  Hives or difficulty breathing ( go to the emergency room)  Inflammation or drainage at the injection site  Any new symptoms which are concerning to you  Please note:  Although the local anesthetic injected can often make your back/ hip/ buttock/ leg feel good for several hours after the injections, the pain will likely return.  It takes 3-7 days for steroids to work in the sacroiliac area.  You may not notice any pain relief for at least that one week.  If effective, we will often do a series of three injections spaced 3-6 weeks apart to maximally decrease your pain.  After the initial series, we generally will wait some months before a repeat injection of the same type.  If you have any questions, please call [phone] [facility]

## 2017-04-04 NOTE — Addendum Note (Signed)
Addended by: Juanda Chance on: 04/04/2017 03:39 PM   Modules accepted: Orders

## 2017-04-09 ENCOUNTER — Ambulatory Visit (HOSPITAL_BASED_OUTPATIENT_CLINIC_OR_DEPARTMENT_OTHER): Payer: Medicare Other | Admitting: Pain Medicine

## 2017-04-09 ENCOUNTER — Encounter: Payer: Self-pay | Admitting: Pain Medicine

## 2017-04-09 ENCOUNTER — Ambulatory Visit
Admission: RE | Admit: 2017-04-09 | Discharge: 2017-04-09 | Disposition: A | Discharge status: Home / Self Care | Payer: Medicare Other | Source: Ambulatory Visit | Attending: Pain Medicine | Admitting: Pain Medicine

## 2017-04-09 VITALS — BP 109/79 | HR 106 | Temp 97.9°F | Resp 18 | Ht 59.0 in | Wt 111.0 lb

## 2017-04-09 DIAGNOSIS — M545 Low back pain, unspecified: Secondary | ICD-10-CM

## 2017-04-09 DIAGNOSIS — M51369 Other intervertebral disc degeneration, lumbar region without mention of lumbar back pain or lower extremity pain: Secondary | ICD-10-CM

## 2017-04-09 DIAGNOSIS — M533 Sacrococcygeal disorders, not elsewhere classified: Secondary | ICD-10-CM | POA: Insufficient documentation

## 2017-04-09 DIAGNOSIS — M5136 Other intervertebral disc degeneration, lumbar region: Secondary | ICD-10-CM | POA: Diagnosis not present

## 2017-04-09 DIAGNOSIS — G8929 Other chronic pain: Secondary | ICD-10-CM | POA: Diagnosis not present

## 2017-04-09 MED ORDER — FENTANYL CITRATE (PF) 100 MCG/2ML IJ SOLN: 25.0000 ug | INTRAMUSCULAR | Status: DC | PRN

## 2017-04-09 MED ORDER — ROPIVACAINE HCL 2 MG/ML IJ SOLN
4.0000 mL | Freq: Once | INTRAMUSCULAR | Status: AC
  Administered 2017-04-09: 10 mL via INTRA_ARTICULAR
  Filled 2017-04-09: qty 10

## 2017-04-09 MED ORDER — MIDAZOLAM HCL 5 MG/5ML IJ SOLN: 1.0000 mg | INTRAMUSCULAR | Status: DC | PRN

## 2017-04-09 MED ORDER — METHYLPREDNISOLONE ACETATE 80 MG/ML IJ SUSP
80.0000 mg | Freq: Once | INTRAMUSCULAR | Status: AC
  Administered 2017-04-09: 80 mg via INTRA_ARTICULAR
  Filled 2017-04-09: qty 1

## 2017-04-09 MED ORDER — LACTATED RINGERS IV SOLN: 1000.0000 mL | Freq: Once | INTRAVENOUS | Status: DC

## 2017-04-09 MED ORDER — ROPIVACAINE HCL 2 MG/ML IJ SOLN
INTRAMUSCULAR | Status: AC
  Filled 2017-04-09: qty 10

## 2017-04-09 MED ORDER — LIDOCAINE HCL 2 % IJ SOLN
20.0000 mL | Freq: Once | INTRAMUSCULAR | Status: DC
  Filled 2017-04-09: qty 20

## 2017-04-09 NOTE — Progress Notes (Signed)
Patient's Name: Amanda Soto  MRN: [id number]  Referring Provider: Vevelyn Francois, NP  DOB: 03/04/1925  PCP: [name], MD  DOS: 04/09/2017  Note by: Gaspar Cola, MD  Service setting: Ambulatory outpatient  Specialty: Interventional Pain Management  Patient type: Established  Location: [facility] (AMB) Pain Management Facility  Visit type: Interventional Procedure   Primary Reason for Visit: Interventional Pain Management Treatment. CC: Back Pain (low right)  Procedure:  Anesthesia, Analgesia, Anxiolysis:  Type: Diagnostic Sacroiliac Joint Steroid Injection Region: Superior Lumbosacral Region Level: PSIS (Posterior Superior Iliac Spine) Laterality: Right-Sided  Type: Local Anesthesia with Moderate (Conscious) Sedation Local Anesthetic: Lidocaine 1% Route: Intravenous (IV) IV Access: Secured Sedation: Meaningful verbal contact was maintained at all times during the procedure  Indication(s): Analgesia and Anxiety   Indications: 1. Chronic sacroiliac joint pain (Right)   2. Chronic low back pain (Primary Source of Pain) (Right)   3. DDD (degenerative disc disease), lumbar   4. Chronic sacroiliac joint pain    Pain Score: Pre-procedure: 10-Worst pain ever/10 Post-procedure: 1 /10  Pre-op Assessment:  Amanda Soto is a 83 y.o. (year old), female patient, seen today for interventional treatment. She  has a past surgical history that includes Cataract extraction; Partial hysterectomy; Appendectomy; and Tonsillectomy. Amanda Soto has a current medication list which includes the following prescription(s): acetaminophen, apixaban, atorvastatin, cephalexin, difluprednate, docusate sodium, furosemide, iron polysaccharides, levothyroxine, multivitamin, omeprazole, sotalol, and tramadol, and the following Facility-Administered Medications: fentanyl, lactated ringers, lidocaine, and midazolam. Her primarily concern today is the Back Pain (low right)  Initial Vital Signs: There were no vitals  taken for this visit. BMI: Estimated body mass index is 22.42 kg/m as calculated from the following:   Height as of this encounter: 4\' 11"  (1.499 m).   Weight as of this encounter: 111 lb (50.3 kg).  Risk Assessment: Allergies: Reviewed. She is allergic to hydrocodone; ciprofloxacin; and latex.  Allergy Precautions: None required Coagulopathies: Reviewed. None identified.  Blood-thinner therapy: None at this time Active Infection(s): Reviewed. None identified. Amanda Soto is afebrile  Site Confirmation: Amanda Soto was asked to confirm the procedure and laterality before marking the site Procedure checklist: Completed Consent: Before the procedure and under the influence of no sedative(s), amnesic(s), or anxiolytics, the patient was informed of the treatment options, risks and possible complications. To fulfill our ethical and legal obligations, as recommended by the American Medical Association's Code of Ethics, I have informed the patient of my clinical impression; the nature and purpose of the treatment or procedure; the risks, benefits, and possible complications of the intervention; the alternatives, including doing nothing; the risk(s) and benefit(s) of the alternative treatment(s) or procedure(s); and the risk(s) and benefit(s) of doing nothing. The patient was provided information about the general risks and possible complications associated with the procedure. These may include, but are not limited to: failure to achieve desired goals, infection, bleeding, organ or nerve damage, allergic reactions, paralysis, and death. In addition, the patient was informed of those risks and complications associated to the procedure, such as failure to decrease pain; infection; bleeding; organ or nerve damage with subsequent damage to sensory, motor, and/or autonomic systems, resulting in permanent pain, numbness, and/or weakness of one or several areas of the body; allergic reactions; (i.e.: anaphylactic  reaction); and/or death. Furthermore, the patient was informed of those risks and complications associated with the medications. These include, but are not limited to: allergic reactions (i.e.: anaphylactic or anaphylactoid reaction(s)); adrenal axis suppression; blood sugar elevation that  in diabetics may result in ketoacidosis or comma; water retention that in patients with history of congestive heart failure may result in shortness of breath, pulmonary edema, and decompensation with resultant heart failure; weight gain; swelling or edema; medication-induced neural toxicity; particulate matter embolism and blood vessel occlusion with resultant organ, and/or nervous system infarction; and/or aseptic necrosis of one or more joints. Finally, the patient was informed that Medicine is not an exact science; therefore, there is also the possibility of unforeseen or unpredictable risks and/or possible complications that may result in a catastrophic outcome. The patient indicated having understood very clearly. We have given the patient no guarantees and we have made no promises. Enough time was given to the patient to ask questions, all of which were answered to the patient's satisfaction. Amanda Soto has indicated that she wanted to continue with the procedure. Attestation: I, the ordering provider, attest that I have discussed with the patient the benefits, risks, side-effects, alternatives, likelihood of achieving goals, and potential problems during recovery for the procedure that I have provided informed consent. Date: 04/09/2017; Time: 8:23 AM  Pre-Procedure Preparation:  Monitoring: As per clinic protocol. Respiration, ETCO2, SpO2, BP, heart rate and rhythm monitor placed and checked for adequate function Safety Precautions: Patient was assessed for positional comfort and pressure points before starting the procedure. Time-out: I initiated and conducted the "Time-out" before starting the procedure, as per  protocol. The patient was asked to participate by confirming the accuracy of the "Time Out" information. Verification of the correct person, site, and procedure were performed and confirmed by me, the nursing staff, and the patient. "Time-out" conducted as per Joint Commission's Universal Protocol (UP.01.01.01). "Time-out" Date & Time: 04/09/2017; 1337 hrs.  Description of Procedure Process:  Position: Prone Target Area: Superior, posterior, aspect of the sacroiliac fissure Approach: Posterior, paraspinal, ipsilateral approach. Area Prepped: Entire Lower Lumbosacral Region Prepping solution: ChloraPrep (2% chlorhexidine gluconate and 70% isopropyl alcohol) Safety Precautions: Aspiration looking for blood return was conducted prior to all injections. At no point did we inject any substances, as a needle was being advanced. No attempts were made at seeking any paresthesias. Safe injection practices and needle disposal techniques used. Medications properly checked for expiration dates. SDV (single dose vial) medications used. Description of the Procedure: Protocol guidelines were followed. The patient was placed in position over the procedure table. The target area was identified and the area prepped in the usual manner. Skin & deeper tissues infiltrated with local anesthetic. Appropriate amount of time allowed to pass for local anesthetics to take effect. The procedure needle was advanced under fluoroscopic guidance into the sacroiliac joint until a firm endpoint was obtained. Proper needle placement secured. Negative aspiration confirmed. Solution injected in intermittent fashion, asking for systemic symptoms every 0.5cc of injectate. The needles were then removed and the area cleansed, making sure to leave some of the prepping solution back to take advantage of its long term bactericidal properties. Vitals:   04/09/17 1245 04/09/17 1300 04/09/17 1343  BP: 114/80 110/81 109/79  Pulse: (!) 125 (!) 115 (!)  106  Resp: 18 18 18   Temp: 97.9 F (36.6 C)    SpO2: 98% 94% 96%  Weight: 111 lb (50.3 kg)    Height: 4\' 11"  (1.499 m)      Start Time: 1337 hrs. End Time: 1341 hrs. Materials:  Needle(s) Type: Regular needle Gauge: 22G Length: 3.5-in Medication(s): We administered methylPREDNISolone acetate and ropivacaine (PF) 2 mg/mL (0.2%). Please see chart orders for dosing details.  Imaging Guidance (Non-Spinal):  Type of Imaging Technique: Fluoroscopy Guidance (Non-Spinal) Indication(s): Assistance in needle guidance and placement for procedures requiring needle placement in or near specific anatomical locations not easily accessible without such assistance. Exposure Time: Please see nurses notes. Contrast: Before injecting any contrast, we confirmed that the patient did not have an allergy to iodine, shellfish, or radiological contrast. Once satisfactory needle placement was completed at the desired level, radiological contrast was injected. Contrast injected under live fluoroscopy. No contrast complications. See chart for type and volume of contrast used. Fluoroscopic Guidance: I was personally present during the use of fluoroscopy. "Tunnel Vision Technique" used to obtain the best possible view of the target area. Parallax error corrected before commencing the procedure. "Direction-depth-direction" technique used to introduce the needle under continuous pulsed fluoroscopy. Once target was reached, antero-posterior, oblique, and lateral fluoroscopic projection used confirm needle placement in all planes. Images permanently stored in EMR. Interpretation: I personally interpreted the imaging intraoperatively. Adequate needle placement confirmed in multiple planes. Appropriate spread of contrast into desired area was observed. No evidence of afferent or efferent intravascular uptake. Permanent images saved into the patient's record.  Antibiotic Prophylaxis:  Indication(s): None identified Antibiotic  given: None  Post-operative Assessment:  EBL: None Complications: No immediate post-treatment complications observed by team, or reported by patient. Note: The patient tolerated the entire procedure well. A repeat set of vitals were taken after the procedure and the patient was kept under observation following institutional policy, for this type of procedure. Post-procedural neurological assessment was performed, showing return to baseline, prior to discharge. The patient was provided with post-procedure discharge instructions, including a section on how to identify potential problems. Should any problems arise concerning this procedure, the patient was given instructions to immediately contact us, at any time, without hesitation. In any case, we plan to contact the patient by telephone for a follow-up status report regarding this interventional procedure. Comments:  No additional relevant information.  Plan of Care    Imaging Orders     DG C-Arm 1-60 Min-No Report  Procedure Orders     SACROILIAC JOINT INJECTINS  Medications ordered for procedure: Meds ordered this encounter  Medications  . lactated ringers infusion 1,000 mL  . midazolam (VERSED) 5 MG/5ML injection 1-2 mg    Make sure Flumazenil is available in the pyxis when using this medication. If oversedation occurs, administer 0.2 mg IV over 15 sec. If after 45 sec no response, administer 0.2 mg again over 1 min; may repeat at 1 min intervals; not to exceed 4 doses (1 mg)  . fentaNYL (SUBLIMAZE) injection 25-50 mcg    Make sure Narcan is available in the pyxis when using this medication. In the event of respiratory depression (RR< 8/min): Titrate NARCAN (naloxone) in increments of 0.1 to 0.2 mg IV at 2-3 minute intervals, until desired degree of reversal.  . lidocaine (XYLOCAINE) 2 % (with pres) injection 400 mg  . methylPREDNISolone acetate (DEPO-MEDROL) injection 80 mg  . ropivacaine (PF) 2 mg/mL (0.2%) (NAROPIN) injection 4 mL    Medications administered: We administered methylPREDNISolone acetate and ropivacaine (PF) 2 mg/mL (0.2%).  See the medical record for exact dosing, route, and time of administration.  New Prescriptions   No medications on file   Disposition: Discharge home  Discharge Date & Time: 04/09/2017; 1350 hrs.   Physician-requested Follow-up: Return for post-procedure eval by Dr. Dossie Arbour in 2 wks. Future Appointments Date Time Provider [place]  04/24/2017 1:30 PM Milinda Pointer, MD [facility] None  06/04/2017 2:00 PM [name], MD [facility] None  07/26/2017 3:00 PM O'Brien-Blaney, Bryson Corona, LPN [facility] None   Primary Care Physician: [name], MD Location: [facility] Note by: Gaspar Cola, MD Date: 04/09/2017; Time: 2:37 PM  Disclaimer:  Medicine is not an exact science. The only guarantee in medicine is that nothing is guaranteed. It is important to note that the decision to proceed with this intervention was based on the information collected from the patient. The Data and conclusions were drawn from the patient's questionnaire, the interview, and the physical examination. Because the information was provided in large part by the patient, it cannot be guaranteed that it has not been purposely or unconsciously manipulated. Every effort has been made to obtain as much relevant data as possible for this evaluation. It is important to note that the conclusions that lead to this procedure are derived in large part from the available data. Always take into account that the treatment will also be dependent on availability of resources and existing treatment guidelines, considered by other Pain Management Practitioners as being common knowledge and practice, at the time of the intervention. For Medico-Legal purposes, it is also important to point out that variation in procedural techniques and pharmacological choices are the acceptable  norm. The indications, contraindications, technique, and results of the above procedure should only be interpreted and judged by a Board-Certified Interventional Pain Specialist with extensive familiarity and expertise in the same exact procedure and technique.

## 2017-04-09 NOTE — Progress Notes (Signed)
Safety precautions to be maintained throughout the outpatient stay will include: orient to surroundings, keep bed in low position, maintain call bell within reach at all times, provide assistance with transfer out of bed and ambulation.  

## 2017-04-09 NOTE — Patient Instructions (Addendum)
____________________________________________________________________________________________  Post-Procedure instructions Instructions:  Apply ice: Fill a plastic sandwich bag with crushed ice. Cover it with a small towel and apply to injection site. Apply for 15 minutes then remove x 15 minutes. Repeat sequence on day of procedure, until you go to bed. The purpose is to minimize swelling and discomfort after procedure.  Apply heat: Apply heat to procedure site starting the day following the procedure. The purpose is to treat any soreness and discomfort from the procedure.  Food intake: Start with clear liquids (like water) and advance to regular food, as tolerated.   Physical activities: Keep activities to a minimum for the first 8 hours after the procedure.   Driving: If you have received any sedation, you are not allowed to drive for 24 hours after your procedure.  Blood thinner: Restart your blood thinner 6 hours after your procedure. (Only for those taking blood thinners)  Insulin: As soon as you can eat, you may resume your normal dosing schedule. (Only for those taking insulin)  Infection prevention: Keep procedure site clean and dry.  Post-procedure Pain Diary: Extremely important that this be done correctly and accurately. Recorded information will be used to determine the next step in treatment.  Pain evaluated is that of treated area only. Do not include pain from an untreated area.  Complete every hour, on the hour, for the initial 8 hours. Set an alarm to help you do this part accurately.  Do not go to sleep and have it completed later. It will not be accurate.  Follow-up appointment: Keep your follow-up appointment after the procedure. Usually 2 weeks for most procedures. (6 weeks in the case of radiofrequency.) Bring you pain diary.  Expect:  From numbing medicine (AKA: Local Anesthetics): Numbness or decrease in pain.  Onset: Full effect within 15 minutes of  injected.  Duration: It will depend on the type of local anesthetic used. On the average, 1 to 8 hours.   From steroids: Decrease in swelling or inflammation. Once inflammation is improved, relief of the pain will follow.  Onset of benefits: Depends on the amount of swelling present. The more swelling, the longer it will take for the benefits to be seen. In some cases, up to 10 days.  Duration: Steroids will stay in the system x 2 weeks. Duration of benefits will depend on multiple posibilities including persistent irritating factors.  From procedure: Some discomfort is to be expected once the numbing medicine wears off. This should be minimal if ice and heat are applied as instructed. Call if:  You experience numbness and weakness that gets worse with time, as opposed to wearing off.  New onset bowel or bladder incontinence. (Spinal procedures only)  Emergency Numbers:  Durning business hours (Monday - Thursday, 8:00 AM - 4:00 PM) (Friday, 9:00 AM - 12:00 Noon): [phone]  After hours: [phone] ____________________________________________________________________________________________  Pain Management Discharge Instructions  General Discharge Instructions :  If you need to reach your doctor call: Monday-Friday 8:00 am - 4:00 pm at [phone] or toll free [phone].  After clinic hours [phone] to have operator reach doctor.  Bring all of your medication bottles to all your appointments in the pain clinic.  To cancel or reschedule your appointment with Pain Management please remember to call 24 hours in advance to avoid a fee.  Refer to the educational materials which you have been given on: General Risks, I had my Procedure. Discharge Instructions, Post Sedation.  Post Procedure Instructions:  The drugs you   were given will stay in your system until tomorrow, so for the next 24 hours you should not drive, make any legal decisions or drink any alcoholic  beverages.  You may eat anything you prefer, but it is better to start with liquids then soups and crackers, and gradually work up to solid foods.  Please notify your doctor immediately if you have any unusual bleeding, trouble breathing or pain that is not related to your normal pain.  Depending on the type of procedure that was done, some parts of your body may feel week and/or numb.  This usually clears up by tonight or the next day.  Walk with the use of an assistive device or accompanied by an adult for the 24 hours.  You may use ice on the affected area for the first 24 hours.  Put ice in a Ziploc bag and cover with a towel and place against area 15 minutes on 15 minutes off.  You may switch to heat after 24 hours.Sacroiliac (SI) Joint Injection Patient Information  Description: The sacroiliac joint connects the scrum (very low back and tailbone) to the ilium (a pelvic bone which also forms half of the hip joint).  Normally this joint experiences very little motion.  When this joint becomes inflamed or unstable low back and or hip and pelvis pain may result.  Injection of this joint with local anesthetics (numbing medicines) and steroids can provide diagnostic information and reduce pain.  This injection is performed with the aid of x-ray guidance into the tailbone area while you are lying on your stomach.   You may experience an electrical sensation down the leg while this is being done.  You may also experience numbness.  We also may ask if we are reproducing your normal pain during the injection.  Conditions which may be treated SI injection:   Low back, buttock, hip or leg pain  Preparation for the Injection:  1. Do not eat any solid food or dairy products within 8 hours of your appointment.  2. You may drink clear liquids up to 3 hours before appointment.  Clear liquids include water, black coffee, juice or soda.  No milk or cream please. 3. You may take your regular medications,  including pain medications with a sip of water before your appointment.  Diabetics should hold regular insulin (if take separately) and take 1/2 normal NPH dose the morning of the procedure.  Carry some sugar containing items with you to your appointment. 4. A driver must accompany you and be prepared to drive you home after your procedure. 5. Bring all of your current medications with you. 6. An IV may be inserted and sedation may be given at the discretion of the physician. 7. A blood pressure cuff, EKG and other monitors will often be applied during the procedure.  Some patients may need to have extra oxygen administered for a short period.  8. You will be asked to provide medical information, including your allergies, prior to the procedure.  We must know immediately if you are taking blood thinners (like Coumadin/Warfarin) or if you are allergic to IV iodine contrast (dye).  We must know if you could possible be pregnant.  Possible side effects:   Bleeding from needle site  Infection (rare, may require surgery)  Nerve injury (rare)  Numbness & tingling (temporary)  A brief convulsion or seizure  Light-headedness (temporary)  Pain at injection site (several days)  Decreased blood pressure (temporary)  Weakness in the leg (  temporary)   Call if you experience:   New onset weakness or numbness of an extremity below the injection site that last more than 8 hours.  Hives or difficulty breathing ( go to the emergency room)  Inflammation or drainage at the injection site  Any new symptoms which are concerning to you  Please note:  Although the local anesthetic injected can often make your back/ hip/ buttock/ leg feel good for several hours after the injections, the pain will likely return.  It takes 3-7 days for steroids to work in the sacroiliac area.  You may not notice any pain relief for at least that one week.  If effective, we will often do a series of three injections  spaced 3-6 weeks apart to maximally decrease your pain.  After the initial series, we generally will wait some months before a repeat injection of the same type.  If you have any questions, please call [phone] [facility]

## 2017-04-10 ENCOUNTER — Telehealth: Payer: Self-pay | Admitting: *Deleted

## 2017-04-10 NOTE — Telephone Encounter (Signed)
No problems post procedure. 

## 2017-04-24 ENCOUNTER — Ambulatory Visit: Payer: Medicare Other | Attending: Pain Medicine | Admitting: Pain Medicine

## 2017-04-24 ENCOUNTER — Encounter: Payer: Self-pay | Admitting: Pain Medicine

## 2017-04-24 VITALS — BP 130/97 | HR 112 | Temp 97.9°F | Resp 16 | Ht 59.0 in | Wt 108.0 lb

## 2017-04-24 DIAGNOSIS — Z79899 Other long term (current) drug therapy: Secondary | ICD-10-CM | POA: Insufficient documentation

## 2017-04-24 DIAGNOSIS — E785 Hyperlipidemia, unspecified: Secondary | ICD-10-CM | POA: Diagnosis not present

## 2017-04-24 DIAGNOSIS — I110 Hypertensive heart disease with heart failure: Secondary | ICD-10-CM | POA: Diagnosis not present

## 2017-04-24 DIAGNOSIS — G8929 Other chronic pain: Secondary | ICD-10-CM

## 2017-04-24 DIAGNOSIS — M810 Age-related osteoporosis without current pathological fracture: Secondary | ICD-10-CM | POA: Diagnosis not present

## 2017-04-24 DIAGNOSIS — I5022 Chronic systolic (congestive) heart failure: Secondary | ICD-10-CM | POA: Diagnosis not present

## 2017-04-24 DIAGNOSIS — M1288 Other specific arthropathies, not elsewhere classified, other specified site: Secondary | ICD-10-CM | POA: Diagnosis not present

## 2017-04-24 DIAGNOSIS — M419 Scoliosis, unspecified: Secondary | ICD-10-CM | POA: Insufficient documentation

## 2017-04-24 DIAGNOSIS — Z7901 Long term (current) use of anticoagulants: Secondary | ICD-10-CM

## 2017-04-24 DIAGNOSIS — M5124 Other intervertebral disc displacement, thoracic region: Secondary | ICD-10-CM | POA: Diagnosis not present

## 2017-04-24 DIAGNOSIS — K219 Gastro-esophageal reflux disease without esophagitis: Secondary | ICD-10-CM | POA: Insufficient documentation

## 2017-04-24 DIAGNOSIS — Z885 Allergy status to narcotic agent status: Secondary | ICD-10-CM | POA: Diagnosis not present

## 2017-04-24 DIAGNOSIS — M488X6 Other specified spondylopathies, lumbar region: Secondary | ICD-10-CM | POA: Diagnosis not present

## 2017-04-24 DIAGNOSIS — F419 Anxiety disorder, unspecified: Secondary | ICD-10-CM | POA: Insufficient documentation

## 2017-04-24 DIAGNOSIS — I4891 Unspecified atrial fibrillation: Secondary | ICD-10-CM | POA: Insufficient documentation

## 2017-04-24 DIAGNOSIS — M545 Low back pain: Secondary | ICD-10-CM | POA: Diagnosis not present

## 2017-04-24 DIAGNOSIS — H3530 Unspecified macular degeneration: Secondary | ICD-10-CM | POA: Insufficient documentation

## 2017-04-24 DIAGNOSIS — Z9181 History of falling: Secondary | ICD-10-CM | POA: Diagnosis not present

## 2017-04-24 DIAGNOSIS — I340 Nonrheumatic mitral (valve) insufficiency: Secondary | ICD-10-CM | POA: Insufficient documentation

## 2017-04-24 DIAGNOSIS — G894 Chronic pain syndrome: Secondary | ICD-10-CM

## 2017-04-24 DIAGNOSIS — R001 Bradycardia, unspecified: Secondary | ICD-10-CM | POA: Insufficient documentation

## 2017-04-24 DIAGNOSIS — R739 Hyperglycemia, unspecified: Secondary | ICD-10-CM | POA: Insufficient documentation

## 2017-04-24 DIAGNOSIS — M533 Sacrococcygeal disorders, not elsewhere classified: Secondary | ICD-10-CM

## 2017-04-24 DIAGNOSIS — M4316 Spondylolisthesis, lumbar region: Secondary | ICD-10-CM | POA: Diagnosis not present

## 2017-04-24 DIAGNOSIS — E039 Hypothyroidism, unspecified: Secondary | ICD-10-CM | POA: Diagnosis not present

## 2017-04-24 DIAGNOSIS — Z803 Family history of malignant neoplasm of breast: Secondary | ICD-10-CM | POA: Diagnosis not present

## 2017-04-24 DIAGNOSIS — M5116 Intervertebral disc disorders with radiculopathy, lumbar region: Secondary | ICD-10-CM | POA: Insufficient documentation

## 2017-04-24 DIAGNOSIS — M4726 Other spondylosis with radiculopathy, lumbar region: Secondary | ICD-10-CM | POA: Insufficient documentation

## 2017-04-24 DIAGNOSIS — Z91040 Latex allergy status: Secondary | ICD-10-CM | POA: Insufficient documentation

## 2017-04-24 DIAGNOSIS — R197 Diarrhea, unspecified: Secondary | ICD-10-CM | POA: Insufficient documentation

## 2017-04-24 DIAGNOSIS — R42 Dizziness and giddiness: Secondary | ICD-10-CM | POA: Diagnosis not present

## 2017-04-24 DIAGNOSIS — M47816 Spondylosis without myelopathy or radiculopathy, lumbar region: Secondary | ICD-10-CM | POA: Diagnosis not present

## 2017-04-24 DIAGNOSIS — Z881 Allergy status to other antibiotic agents status: Secondary | ICD-10-CM | POA: Diagnosis not present

## 2017-04-24 DIAGNOSIS — F324 Major depressive disorder, single episode, in partial remission: Secondary | ICD-10-CM | POA: Insufficient documentation

## 2017-04-24 DIAGNOSIS — R0602 Shortness of breath: Secondary | ICD-10-CM | POA: Insufficient documentation

## 2017-04-24 DIAGNOSIS — D649 Anemia, unspecified: Secondary | ICD-10-CM | POA: Insufficient documentation

## 2017-04-24 DIAGNOSIS — R5383 Other fatigue: Secondary | ICD-10-CM | POA: Insufficient documentation

## 2017-04-24 MED ORDER — TRAMADOL HCL 50 MG PO TABS: 50.0000 mg | ORAL_TABLET | Freq: Four times a day (QID) | ORAL | 5 refills | Status: DC | PRN

## 2017-04-24 NOTE — Progress Notes (Signed)
Safety precautions to be maintained throughout the outpatient stay will include: orient to surroundings, keep bed in low position, maintain call bell within reach at all times, provide assistance with transfer out of bed and ambulation.  

## 2017-04-24 NOTE — Progress Notes (Signed)
Patient's Name: Amanda Soto  MRN: [id number]  Referring Provider: [name], MD  DOB: 06-06-1925  PCP: [name], MD  DOS: 04/24/2017  Note by: Gaspar Cola, MD  Service setting: Ambulatory outpatient  Specialty: Interventional Pain Management  Location: [facility] (AMB) Pain Management Facility    Patient type: Established   Primary Reason(s) for Visit: Encounter for post-procedure evaluation of chronic illness with mild to moderate exacerbation CC: Back Pain (lower)  HPI  Amanda Soto is a 83 y.o. year old, female patient, who comes today for a post-procedure evaluation. She has Chronic systolic heart failure ([place]); Bradycardia; Atrial fibrillation ([place]); HTN (hypertension); Abdominal discomfort; Exertional shortness of breath; Elevated glucose; Hypothyroidism; Lightheadedness; Symptomatic anemia; Anemia; Anxiety; Degeneration of intervertebral disc of lumbar region; Recurrent major depressive disorder, in partial remission ([place]); Cardiac murmur; H/O neoplasm; HLD (hyperlipidemia); MI (mitral incompetence); OP (osteoporosis); Chronic low back pain (Primary Source of Pain) (Right); Lumbar facet syndrome (Right); Grade 1 Anterolisthesis of L4 over L5 (5 mm); Lumbar spondylosis; Scoliosis of lumbar spine (concave to right side); Lumbar facet arthropathy; Osteoarthritis of hip (Right); Thoracic paracentral T7-8 disc protrusion; Depression; UTI (lower urinary tract infection); Bruising; Diarrhea; Macular degeneration; Chronic pain syndrome; Tiredness; Closed Colles' fracture; Dysuria; History of fall; Chronic sacroiliac joint pain (Right); DDD (degenerative disc disease), lumbar; and Long term current use of anticoagulant (Elaquis) on her problem list. Her primarily concern today is the Back Pain (lower)  Pain Assessment: Location: Lower Back Radiating: no pain at this time Onset: More than a month ago Duration: Chronic pain Quality: Aching Severity: 0-No pain/10 (self-reported pain  score)  Note: Reported level is compatible with observation.                         Effect on ADL: pace self Timing: Constant Modifying factors: medicine and procedure  Amanda Soto comes in today for post-procedure evaluation after the treatment done on 04/09/2017.   Further details on both, my assessment(s), as well as the proposed treatment plan, please see below.  Controlled Substance Pharmacotherapy Assessment REMS (Risk Evaluation and Mitigation Strategy)  Analgesic: Tramadol 50 mg 1 tablet by mouth every 6 hours when necessary for pain. (200 mg/day of tramadol) (20 MME/day) MME/day: 20 mg/day.  Lona Millard, RN  04/24/2017  2:21 PM  Sign at close encounter Safety precautions to be maintained throughout the outpatient stay will include: orient to surroundings, keep bed in low position, maintain call bell within reach at all times, provide assistance with transfer out of bed and ambulation.    Pharmacokinetics: Liberation and absorption (onset of action): WNL Distribution (time to peak effect): WNL Metabolism and excretion (duration of action): WNL         Pharmacodynamics: Desired effects: Analgesia: Amanda Soto reports >50% benefit. Functional ability: Patient reports that medication allows her to accomplish basic ADLs Clinically meaningful improvement in function (CMIF): Sustained CMIF goals met Perceived effectiveness: Described as relatively effective, allowing for increase in activities of daily living (ADL) Undesirable effects: Side-effects or Adverse reactions: None reported Monitoring:  PMP: Online review of the past 39-monthperiod conducted. Compliant with practice rules and regulations Last UDS on record: No results found for: SUMMARY UDS interpretation: Compliant          Medication Assessment Form: Reviewed. Patient indicates being compliant with therapy Treatment compliance: Compliant Risk Assessment Profile: Aberrant behavior: See prior evaluations. None  observed or detected today Comorbid factors increasing risk of overdose: See  prior notes. No additional risks detected today Risk of substance use disorder (SUD): Low     Opioid Risk Tool - 04/24/17 1415      Family History of Substance Abuse   Alcohol Negative   Illegal Drugs Negative   Rx Drugs Negative     Personal History of Substance Abuse   Alcohol Negative   Illegal Drugs Negative   Rx Drugs Negative     Age   Age between 77-45 years  No     History of Preadolescent Sexual Abuse   History of Preadolescent Sexual Abuse Negative or Female     Psychological Disease   Psychological Disease Negative   Depression Negative     Total Score   Opioid Risk Tool Scoring 0   Opioid Risk Interpretation Low Risk     ORT Scoring interpretation table:  Score <3 = Low Risk for SUD  Score between 4-7 = Moderate Risk for SUD  Score >8 = High Risk for Opioid Abuse   Risk Mitigation Strategies:  Patient Counseling: Covered Patient-Prescriber Agreement (PPA): Present and active  Notification to other healthcare providers: Done  Pharmacologic Plan: No change in therapy, at this time   Post-Procedure Assessment  04/09/2017 Procedure: Diagnostic right-sided sacroiliac joint block #1 under fluoroscopic guidance and IV sedation Pre-procedure pain score:  10/10 Post-procedure pain score: 1/10 (> 50% relief) Influential Factors: BMI: 21.81 kg/m Intra-procedural challenges: None observed.         Assessment challenges: None detected.              Reported side-effects: None.        Post-procedural adverse reactions or complications: None reported         Sedation: Sedation provided. When no sedatives are used, the analgesic levels obtained are directly associated to the effectiveness of the local anesthetics. However, when sedation is provided, the level of analgesia obtained during the initial 1 hour following the intervention, is believed to be the result of a combination of factors.  These factors may include, but are not limited to: 1. The effectiveness of the local anesthetics used. 2. The effects of the analgesic(s) and/or anxiolytic(s) used. 3. The degree of discomfort experienced by the patient at the time of the procedure. 4. The patients ability and reliability in recalling and recording the events. 5. The presence and influence of possible secondary gains and/or psychosocial factors. Reported result: Relief experienced during the 1st hour after the procedure: 100 % (Ultra-Short Term Relief) Amanda Soto has indicated area to have been numb during this time. Interpretative annotation: Clinically appropriate result. Analgesia during this period is likely to be Local Anesthetic and/or IV Sedative (Analgesic/Anxiolytic) related.          Effects of local anesthetic: The analgesic effects attained during this period are directly associated to the localized infiltration of local anesthetics and therefore cary significant diagnostic value as to the etiological location, or anatomical origin, of the pain. Expected duration of relief is directly dependent on the pharmacodynamics of the local anesthetic used. Long-acting (4-6 hours) anesthetics used.  Reported result: Relief during the next 4 to 6 hour after the procedure: 70 % (Short-Term Relief)            Interpretative annotation: Clinically appropriate result. Analgesia during this period is likely to be Local Anesthetic-related.          Long-term benefit: Defined as the period of time past the expected duration of local anesthetics (1 hour for short-acting and 4-6  hours for long-acting). With the possible exception of prolonged sympathetic blockade from the local anesthetics, benefits during this period are typically attributed to, or associated with, other factors such as analgesic sensory neuropraxia, antiinflammatory effects, or beneficial biochemical changes provided by agents other than the local anesthetics.  Reported  result: Extended relief following procedure: 40 % (Long-Term Relief) Amanda Soto reports the axial pain improved more than the extremity pain. Interpretative annotation: Clinically appropriate result. Good relief. No permanent benefit expected. Inflammation plays a part in the etiology to the pain. Benefit believed to be steroid-related.  Current benefits: Defined as reported results that persistent at this point in time.   Analgesia: 100 % Amanda Soto reports improvement of axial symptoms. Function: Amanda Soto reports improvement in function ROM: Amanda Soto reports improvement in ROM Interpretative annotation: Recurrence of symptoms. No permanent benefit expected. Effective diagnostic intervention.          Interpretation: Results would suggest a successful diagnostic intervention.                  Plan:  Set up procedure as a PRN palliative treatment option for this patient.        Laboratory Chemistry  Inflammation Markers (CRP: Acute Phase) (ESR: Chronic Phase) Lab Results  Component Value Date   ESRSEDRATE 18 07/25/2016                 Renal Function Markers Lab Results  Component Value Date   BUN 14 11/02/2016   CREATININE 0.67 11/02/2016   GFRAA >60 11/10/2015   GFRNONAA >60 11/10/2015                 Hepatic Function Markers Lab Results  Component Value Date   AST 28 11/02/2016   ALT 18 11/02/2016   ALBUMIN 3.9 11/02/2016   ALKPHOS 56 11/02/2016                 Electrolytes Lab Results  Component Value Date   NA 139 11/02/2016   K 4.2 11/02/2016   CL 103 11/02/2016   CALCIUM 9.3 11/02/2016   MG 2.1 11/10/2015                 Neuropathy Markers Lab Results  Component Value Date   VITAMINB12 227 11/10/2015                 Bone Pathology Markers Lab Results  Component Value Date   ALKPHOS 56 11/02/2016   CALCIUM 9.3 11/02/2016                 Coagulation Parameters Lab Results  Component Value Date   INR 1.3 02/05/2014   LABPROT 15.5 (H) 02/05/2014    APTT < 23.0 (L) 02/05/2014   PLT 229 11/02/2016                 Cardiovascular Markers Lab Results  Component Value Date   HGB 13.6 11/02/2016   HCT 42.6 11/02/2016                 Note: Lab results reviewed.  Recent Diagnostic Imaging Results  DG C-Arm 1-60 Min-No Report Fluoroscopy was utilized by the requesting physician.  No radiographic  interpretation.   Complexity Note: Imaging results reviewed. Results shared with Amanda Soto, using Layman's terms.                         Meds   Current Outpatient Prescriptions:  .  acetaminophen (TYLENOL) 325 MG tablet, Take 650 mg by mouth every 6 (six) hours as needed for moderate pain, fever or headache., Disp: , Rfl:  .  apixaban (ELIQUIS) 2.5 MG TABS tablet, Take 1 tablet (2.5 mg total) by mouth 2 (two) times daily., Disp: 180 tablet, Rfl: 3 .  atorvastatin (LIPITOR) 10 MG tablet, Take 1 tablet (10 mg total) by mouth daily., Disp: 90 tablet, Rfl: 2 .  Difluprednate (DUREZOL) 0.05 % EMUL, Frequency:PHARMDIR   Dosage:0.0     Instructions:  Note:One drop four times daily in the operative eye. Dose: 0.05 %, Disp: , Rfl:  .  docusate sodium (COLACE) 100 MG capsule, Take by mouth 2 (two) times daily. , Disp: , Rfl:  .  furosemide (LASIX) 20 MG tablet, Take 1 tablet (20 mg total) by mouth daily., Disp: 90 tablet, Rfl: 1 .  iron polysaccharides (NIFEREX) 150 MG capsule, Take 1 capsule (150 mg total) by mouth daily., Disp: 30 capsule, Rfl: 2 .  levothyroxine (SYNTHROID) 50 MCG tablet, Take 1 tablet (50 mcg total) by mouth daily before breakfast., Disp: 90 tablet, Rfl: 1 .  Multiple Vitamin (MULTIVITAMIN) capsule, Take 1 capsule by mouth daily. , Disp: , Rfl:  .  sotalol (BETAPACE) 80 MG tablet, Take 40 mg by mouth 2 (two) times daily. , Disp: , Rfl:  .  traMADol (ULTRAM) 50 MG tablet, Take 1 tablet (50 mg total) by mouth every 6 (six) hours as needed., Disp: 120 tablet, Rfl: 5  ROS  Constitutional: Denies any fever or chills Gastrointestinal:  No reported hemesis, hematochezia, vomiting, or acute GI distress Musculoskeletal: Denies any acute onset joint swelling, redness, loss of ROM, or weakness Neurological: No reported episodes of acute onset apraxia, aphasia, dysarthria, agnosia, amnesia, paralysis, loss of coordination, or loss of consciousness  Allergies  Amanda Soto is allergic to hydrocodone; ciprofloxacin; and latex.  PFSH  Drug: Amanda Soto  reports that she does not use drugs. Alcohol:  reports that she does not drink alcohol. Tobacco:  reports that she has never smoked. She has never used smokeless tobacco. Medical:  has a past medical history of Acute postoperative pain (12/17/2016); Arthritis; Atrial fibrillation ([place]); CHF (congestive heart failure) ([place]); Chickenpox; Chronic abdominal pain (01/16/2014); Degenerative arthritis of hip (08/19/2014); Degenerative arthritis of lumbar spine (11/18/2013); Depression; Diverticulitis; GERD (gastroesophageal reflux disease); Heart murmur; Hyperlipidemia; Hypertension; Hypothyroid; Neuritis or radiculitis due to rupture of lumbar intervertebral disc (11/18/2013); Skin cancer; and Trochanteric bursitis of right hip (11/18/2013). Surgical: Amanda Soto  has a past surgical history that includes Cataract extraction; Partial hysterectomy; Appendectomy; and Tonsillectomy. Family: family history includes Breast cancer in her mother and sister; Heart disease in her unknown relative; Hypertension in her unknown relative; Stroke in her maternal grandmother.  Constitutional Exam  General appearance: Well nourished, well developed, and well hydrated. In no apparent acute distress Vitals:   04/24/17 1359  BP: (!) 130/97  Pulse: (!) 112  Resp: 16  Temp: 97.9 F (36.6 C)  SpO2: 97%  Weight: 108 lb (49 kg)  Height: 4' 11"  (1.499 m)   BMI Assessment: Estimated body mass index is 21.81 kg/m as calculated from the following:   Height as of this encounter: 4' 11"  (1.499 m).   Weight as of this  encounter: 108 lb (49 kg).  BMI interpretation table: BMI level Category Range association with higher incidence of chronic pain  <18 kg/m2 Underweight   18.5-24.9 kg/m2 Ideal body weight   25-29.9 kg/m2 Overweight Increased incidence by 20%  30-34.9 kg/m2 Obese (Class I) Increased incidence by 68%  35-39.9 kg/m2 Severe obesity (Class II) Increased incidence by 136%  >40 kg/m2 Extreme obesity (Class III) Increased incidence by 254%   BMI Readings from Last 4 Encounters:  04/24/17 21.81 kg/m  04/09/17 22.42 kg/m  04/04/17 22.64 kg/m  02/11/17 22.64 kg/m   Wt Readings from Last 4 Encounters:  04/24/17 108 lb (49 kg)  04/09/17 111 lb (50.3 kg)  04/04/17 114 lb (51.7 kg)  02/11/17 114 lb (51.7 kg)  Psych/Mental status: Alert, oriented x 3 (person, place, & time)       Eyes: PERLA Respiratory: No evidence of acute respiratory distress  Cervical Spine Area Exam  Skin & Axial Inspection: No masses, redness, edema, swelling, or associated skin lesions Alignment: Symmetrical Functional ROM: Unrestricted ROM      Stability: No instability detected Muscle Tone/Strength: Functionally intact. No obvious neuro-muscular anomalies detected. Sensory (Neurological): Unimpaired Palpation: No palpable anomalies              Upper Extremity (UE) Exam    Side: Right upper extremity  Side: Left upper extremity  Skin & Extremity Inspection: Skin color, temperature, and hair growth are WNL. No peripheral edema or cyanosis. No masses, redness, swelling, asymmetry, or associated skin lesions. No contractures.  Skin & Extremity Inspection: Skin color, temperature, and hair growth are WNL. No peripheral edema or cyanosis. No masses, redness, swelling, asymmetry, or associated skin lesions. No contractures.  Functional ROM: Unrestricted ROM          Functional ROM: Unrestricted ROM          Muscle Tone/Strength: Functionally intact. No obvious neuro-muscular anomalies detected.  Muscle Tone/Strength:  Functionally intact. No obvious neuro-muscular anomalies detected.  Sensory (Neurological): Unimpaired          Sensory (Neurological): Unimpaired          Palpation: No palpable anomalies              Palpation: No palpable anomalies              Specialized Test(s): Deferred         Specialized Test(s): Deferred          Thoracic Spine Area Exam  Skin & Axial Inspection: No masses, redness, or swelling Alignment: Symmetrical Functional ROM: Unrestricted ROM Stability: No instability detected Muscle Tone/Strength: Functionally intact. No obvious neuro-muscular anomalies detected. Sensory (Neurological): Unimpaired Muscle strength & Tone: No palpable anomalies  Lumbar Spine Area Exam  Skin & Axial Inspection: No masses, redness, or swelling Alignment: Symmetrical Functional ROM: Unrestricted ROM      Stability: No instability detected Muscle Tone/Strength: Functionally intact. No obvious neuro-muscular anomalies detected. Sensory (Neurological): Unimpaired Palpation: No palpable anomalies       Provocative Tests: Lumbar Hyperextension and rotation test: evaluation deferred today       Lumbar Lateral bending test: evaluation deferred today       Patrick's Maneuver: evaluation deferred today                    Gait & Posture Assessment  Ambulation: Unassisted Gait: Relatively normal for age and body habitus Posture: WNL   Lower Extremity Exam    Side: Right lower extremity  Side: Left lower extremity  Skin & Extremity Inspection: Skin color, temperature, and hair growth are WNL. No peripheral edema or cyanosis. No masses, redness, swelling, asymmetry, or associated skin lesions. No contractures.  Skin & Extremity Inspection: Skin color, temperature, and hair  growth are WNL. No peripheral edema or cyanosis. No masses, redness, swelling, asymmetry, or associated skin lesions. No contractures.  Functional ROM: Unrestricted ROM          Functional ROM: Unrestricted ROM          Muscle  Tone/Strength: Functionally intact. No obvious neuro-muscular anomalies detected.  Muscle Tone/Strength: Functionally intact. No obvious neuro-muscular anomalies detected.  Sensory (Neurological): Unimpaired  Sensory (Neurological): Unimpaired  Palpation: No palpable anomalies  Palpation: No palpable anomalies   Assessment  Primary Diagnosis & Pertinent Problem List: The primary encounter diagnosis was Chronic sacroiliac joint pain (Right). Diagnoses of Lumbar facet syndrome (Right), Chronic low back pain (Primary Source of Pain) (Right), Long term current use of anticoagulant (Elaquis), Lumbar facet arthropathy, and Chronic pain syndrome were also pertinent to this visit.  Status Diagnosis  Controlled Controlled Controlled 1. Chronic sacroiliac joint pain (Right)   2. Lumbar facet syndrome (Right)   3. Chronic low back pain (Primary Source of Pain) (Right)   4. Long term current use of anticoagulant (Elaquis)   5. Lumbar facet arthropathy   6. Chronic pain syndrome     Problems updated and reviewed during this visit: No problems updated. Plan of Care  Pharmacotherapy (Medications Ordered): Meds ordered this encounter  Medications  . traMADol (ULTRAM) 50 MG tablet    Sig: Take 1 tablet (50 mg total) by mouth every 6 (six) hours as needed.    Dispense:  120 tablet    Refill:  5    Fill one day early if pharmacy is closed on scheduled refill date. Do not fill until: 04/24/17 To last until:08/22/17   New Prescriptions   No medications on file   Medications administered today: Amanda Soto had no medications administered during this visit.   Procedure Orders     LUMBAR FACET(MEDIAL BRANCH NERVE BLOCK) MBNB     SACROILIAC JOINT INJECTINS Lab Orders  No laboratory test(s) ordered today   Imaging Orders  No imaging studies ordered today   Referral Orders  No referral(s) requested today    Interventional management options: Planned, scheduled, and/or pending:   PRN  procedure(s): Diagnostic right-sided lumbar facet block + diagnostic right-sided sacroiliac joint block #2 under fluoroscopic guidance and IV sedation.   Considering:   Diagnostic right-sided lumbar facet block + SI Block #2    Palliative PRN treatment(s):   Palliative right-sided lumbar facet block  Palliative right-sided sacroiliac joint block    Provider-requested follow-up: Return in about 4 months (around 08/12/2017) for Med-Mgmt (w/ Dionisio David, NP).  Future Appointments Date Time Provider [place]  06/04/2017 2:00 PM [name], MD [facility] None  07/26/2017 3:00 PM O'Brien-Blaney, Denisa L, LPN [facility] None  07/07/1094 1:30 PM Vevelyn Francois, NP [facility] None   Primary Care Physician: [name], MD Location: [facility] Note by: Gaspar Cola, MD Date: 04/24/2017; Time: 2:57 PM

## 2017-04-24 NOTE — Patient Instructions (Addendum)
____________________________________________________________________________________________  Preparing for Procedure with Sedation Instructions: . Oral Intake: Do not eat or drink anything for at least 8 hours prior to your procedure. . Transportation: Public transportation is not allowed. Bring an adult driver. The driver must be physically present in our waiting room before any procedure can be started. Marland Kitchen Physical Assistance: Bring an adult physically capable of assisting you, in the event you need help. This adult should keep you company at home for at least 6 hours after the procedure. . Blood Pressure Medicine: Take your blood pressure medicine with a sip of water the morning of the procedure. . Blood thinners:  . Diabetics on insulin: Notify the staff so that you can be scheduled 1st case in the morning. If your diabetes requires high dose insulin, take only  of your normal insulin dose the morning of the procedure and notify the staff that you have done so. . Preventing infections: Shower with an antibacterial soap the morning of your procedure. . Build-up your immune system: Take 1000 mg of Vitamin C with every meal (3 times a day) the day prior to your procedure. Marland Kitchen Antibiotics: Inform the staff if you have a condition or reason that requires you to take antibiotics before dental procedures. . Pregnancy: If you are pregnant, call and cancel the procedure. . Sickness: If you have a cold, fever, or any active infections, call and cancel the procedure. . Arrival: You must be in the facility at least 30 minutes prior to your scheduled procedure. . Children: Do not bring children with you. . Dress appropriately: Bring dark clothing that you would not mind if they get stained. . Valuables: Do not bring any jewelry or valuables. Procedure appointments are reserved for interventional treatments only. Marland Kitchen No Prescription Refills. . No medication changes will be discussed during procedure  appointments. . No disability issues will be discussed. ____________________________________________________________________________________________  Sacroiliac (SI) Joint Injection Patient Information  Description: The sacroiliac joint connects the scrum (very low back and tailbone) to the ilium (a pelvic bone which also forms half of the hip joint).  Normally this joint experiences very little motion.  When this joint becomes inflamed or unstable low back and or hip and pelvis pain may result.  Injection of this joint with local anesthetics (numbing medicines) and steroids can provide diagnostic information and reduce pain.  This injection is performed with the aid of x-ray guidance into the tailbone area while you are lying on your stomach.   You may experience an electrical sensation down the leg while this is being done.  You may also experience numbness.  We also may ask if we are reproducing your normal pain during the injection.  Conditions which may be treated SI injection:   Low back, buttock, hip or leg pain  Preparation for the Injection:  1. Do not eat any solid food or dairy products within 8 hours of your appointment.  2. You may drink clear liquids up to 3 hours before appointment.  Clear liquids include water, black coffee, juice or soda.  No milk or cream please. 3. You may take your regular medications, including pain medications with a sip of water before your appointment.  Diabetics should hold regular insulin (if take separately) and take 1/2 normal NPH dose the morning of the procedure.  Carry some sugar containing items with you to your appointment. 4. A driver must accompany you and be prepared to drive you home after your procedure. 5. Bring all of your current medications  with you. 6. An IV may be inserted and sedation may be given at the discretion of the physician. 7. A blood pressure cuff, EKG and other monitors will often be applied during the procedure.  Some  patients may need to have extra oxygen administered for a short period.  8. You will be asked to provide medical information, including your allergies, prior to the procedure.  We must know immediately if you are taking blood thinners (like Coumadin/Warfarin) or if you are allergic to IV iodine contrast (dye).  We must know if you could possible be pregnant.  Possible side effects:   Bleeding from needle site  Infection (rare, may require surgery)  Nerve injury (rare)  Numbness & tingling (temporary)  A brief convulsion or seizure  Light-headedness (temporary)  Pain at injection site (several days)  Decreased blood pressure (temporary)  Weakness in the leg (temporary)   Call if you experience:   New onset weakness or numbness of an extremity below the injection site that last more than 8 hours.  Hives or difficulty breathing ( go to the emergency room)  Inflammation or drainage at the injection site  Any new symptoms which are concerning to you  Please note:  Although the local anesthetic injected can often make your back/ hip/ buttock/ leg feel good for several hours after the injections, the pain will likely return.  It takes 3-7 days for steroids to work in the sacroiliac area.  You may not notice any pain relief for at least that one week.  If effective, we will often do a series of three injections spaced 3-6 weeks apart to maximally decrease your pain.  After the initial series, we generally will wait some months before a repeat injection of the same type.  If you have any questions, please call [phone] [place] After Refer to this sheet in the next few weeks. These instructions provide you with information about caring for yourself after your procedure. Your health care provider may also give you more specific instructions. Your treatment has been planned according to current medical practices,  but problems sometimes occur. Call your health care provider if you have any problems or questions after your procedure. What can I expect after the procedure? After the procedure, it is common to have:  Some tenderness over the injection sites for 2 days after the procedure.  A temporary increase in blood sugar if you have diabetes.  Follow these instructions at home:  Keep track of the amount of pain relief you feel and how long it lasts.  Take over-the-counter and prescription medicines only as told by your health care provider. You may need to limit pain medicine within the first 4-6 hours after the procedure.  Remove your bandages (dressings) the morning after the procedure.  For the first 24 hours after the procedure: ? Do not apply heat near or over the injection sites. ? Do not take a bath or soak in water, such as in a pool or lake. ? Do not drive or operate heavy machinery unless approved by your health care provider. ? Avoid activities that require a lot of energy.  If the injection site is tender, try applying ice to the area. To do this: ? Put ice in a plastic bag. ? Place a towel between your skin and the bag. ? Leave the ice on for 20 minutes, 2-3 times a day.  Keep all follow-up visits  as told by your health care provider. This is important. Contact a health care provider if:  Fluid is coming from an injection site.  There is significant bleeding or swelling at an injection site.  You have diabetes and your blood sugar is above 180 mg/dL. Get help right away if:  You have a fever.  You have worsening pain or swelling around an injection site.  There are red streaks around an injection site.  You develop severe pain that is not controlled by your medicines.  You develop a headache, stiff neck, nausea, or vomiting.  Your eyes become very sensitive to light.  You have weakness, paralysis, or tingling in your arms or legs that was not present before the  procedure.  You have difficulty urinating or breathing. This information is not intended to replace advice given to you by your health care provider. Make sure you discuss any questions you have with your health care provider. Document Released: 06/04/2012 Document Revised: 11/02/2015 Document Reviewed: 03/14/2015 Elsevier Interactive Patient Education  2018 [employer].  Facet Joint Block The facet joints connect the bones of the spine (vertebrae). They make it possible for you to bend, twist, and make other movements with your spine. They also keep you from bending too far, twisting too far, and making other excessive movements. A facet joint block is a procedure where a numbing medicine (anesthetic) is injected into a facet joint. Often, a type of anti-inflammatory medicine called a steroid is also injected. A facet joint block may be done to diagnose neck or back pain. If the pain gets better after a facet joint block, it means the pain is probably coming from the facet joint. If the pain does not get better, it means the pain is probably not coming from the facet joint. A facet joint block may also be done to relieve neck or back pain caused by an inflamed facet joint. A facet joint block is only done to relieve pain if the pain does not improve with other methods, such as medicine, exercise programs, and physical therapy. Tell a health care provider about:  Any allergies you have.  All medicines you are taking, including vitamins, herbs, eye drops, creams, and over-the-counter medicines.  Any problems you or family members have had with anesthetic medicines.  Any blood disorders you have.  Any surgeries you have had.  Any medical conditions you have.  Whether you are pregnant or may be pregnant. What are the risks? Generally, this is a safe procedure. However, problems may occur, including:  Bleeding.  Injury to a nerve near the injection site.  Pain at the injection  site.  Weakness or numbness in areas controlled by nerves near the injection site.  Infection.  Temporary fluid retention.  Allergic reactions to medicines or dyes.  Injury to other structures or organs near the injection site.  What happens before the procedure?  Follow instructions from your health care provider about eating or drinking restrictions.  Ask your health care provider about: ? Changing or stopping your regular medicines. This is especially important if you are taking diabetes medicines or blood thinners. ? Taking medicines such as aspirin and ibuprofen. These medicines can thin your blood. Do not take these medicines before your procedure if your health care provider instructs you not to.  Do not take any new dietary supplements or medicines without asking your health care provider first.  Plan to have someone take you home after the procedure. What happens during the  procedure?  You may need to remove your clothing and dress in an open-back gown.  The procedure will be done while you are lying on an X-ray table. You will most likely be asked to lie on your stomach, but you may be asked to lie in a different position if an injection will be made in your neck.  Machines will be used to monitor your oxygen levels, heart rate, and blood pressure.  If an injection will be made in your neck, an IV tube will be inserted into one of your veins. Fluids and medicine will flow directly into your body through the IV tube.  The area over the facet joint where the injection will be made will be cleaned with soap. The surrounding skin will be covered with clean drapes.  A numbing medicine (local anesthetic) will be applied to your skin. Your skin may sting or burn for a moment.  A video X-ray machine (fluoroscopy) will be used to locate the joint. In some cases, a CT scan may be used.  A contrast dye may be injected into the facet joint area to help locate the joint.  When  the joint is located, an anesthetic will be injected into the joint through the needle.  Your health care provider will ask you whether you feel pain relief. If you do feel relief, a steroid may be injected to provide pain relief for a longer period of time. If you do not feel relief or feel only partial relief, additional injections of an anesthetic may be made in other facet joints.  The needle will be removed.  Your skin will be cleaned.  A bandage (dressing) will be applied over each injection site. The procedure may vary among health care providers and hospitals. What happens after the procedure?  You will be observed for 15-30 minutes before being allowed to go home. This information is not intended to replace advice given to you by your health care provider. Make sure you discuss any questions you have with your health care provider. Document Released: 11/07/2006 Document Revised: 07/20/2015 Document Reviewed: 03/14/2015 Elsevier Interactive Patient Education  [employer].

## 2017-06-04 ENCOUNTER — Ambulatory Visit (INDEPENDENT_AMBULATORY_CARE_PROVIDER_SITE_OTHER): Payer: Medicare Other | Admitting: Family Medicine

## 2017-06-04 ENCOUNTER — Other Ambulatory Visit: Payer: Self-pay

## 2017-06-04 ENCOUNTER — Encounter: Payer: Self-pay | Admitting: Family Medicine

## 2017-06-04 VITALS — BP 100/70 | HR 72 | Temp 97.7°F | Wt 113.0 lb

## 2017-06-04 DIAGNOSIS — F3341 Major depressive disorder, recurrent, in partial remission: Secondary | ICD-10-CM

## 2017-06-04 DIAGNOSIS — R5382 Chronic fatigue, unspecified: Secondary | ICD-10-CM | POA: Diagnosis not present

## 2017-06-04 DIAGNOSIS — L570 Actinic keratosis: Secondary | ICD-10-CM | POA: Diagnosis not present

## 2017-06-04 DIAGNOSIS — E039 Hypothyroidism, unspecified: Secondary | ICD-10-CM | POA: Diagnosis not present

## 2017-06-04 LAB — CBC
HEMATOCRIT: 41.8 % (ref 36.0–46.0)
HEMOGLOBIN: 13.6 g/dL (ref 12.0–15.0)
MCHC: 32.6 g/dL (ref 30.0–36.0)
MCV: 99.9 fl (ref 78.0–100.0)
PLATELETS: 228 10*3/uL (ref 150.0–400.0)
RBC: 4.18 Mil/uL (ref 3.87–5.11)
RDW: 14.6 % (ref 11.5–15.5)
WBC: 9.5 10*3/uL (ref 4.0–10.5)

## 2017-06-04 LAB — COMPREHENSIVE METABOLIC PANEL
ALBUMIN: 4.1 g/dL (ref 3.5–5.2)
ALT: 15 U/L (ref 0–35)
AST: 22 U/L (ref 0–37)
Alkaline Phosphatase: 44 U/L (ref 39–117)
BUN: 17 mg/dL (ref 6–23)
CALCIUM: 9.1 mg/dL (ref 8.4–10.5)
CHLORIDE: 100 meq/L (ref 96–112)
CO2: 29 meq/L (ref 19–32)
CREATININE: 0.51 mg/dL (ref 0.40–1.20)
GFR: 120.33 mL/min (ref >60.00)
Glucose, Bld: 111 mg/dL — ABNORMAL HIGH (ref 70–99)
Potassium: 4.2 mEq/L (ref 3.5–5.1)
Sodium: 136 mEq/L (ref 135–145)
Total Bilirubin: 1.6 mg/dL — ABNORMAL HIGH (ref 0.2–1.2)
Total Protein: 6.8 g/dL (ref 6.0–8.3)

## 2017-06-04 LAB — VITAMIN B12: VITAMIN B 12: 247 pg/mL (ref 211–911)

## 2017-06-04 LAB — TSH: TSH: 0.99 u[IU]/mL (ref 0.35–4.50)

## 2017-06-04 NOTE — Assessment & Plan Note (Signed)
Relatively stable.  Discussed that she could see hospice for grief counseling.  Her son is already seeing them and I noted she could contact them as well.

## 2017-06-04 NOTE — Assessment & Plan Note (Signed)
Check TSH as this may be contributing to her decreased energy level.

## 2017-06-04 NOTE — Assessment & Plan Note (Signed)
Refer to [facility] dermatology.

## 2017-06-04 NOTE — Assessment & Plan Note (Signed)
Likely multifactorial with age, depression, hypothyroidism, pain, or anemia playing a role.  We will check lab work as outlined below.  Determine other steps in management following lab results.

## 2017-06-04 NOTE — Progress Notes (Signed)
[name], MD Phone: [phone]  Amanda Soto is a 83 y.o. female who presents today for follow-up.Marland Kitchen  Hypothyroidism: Taking Synthroid.  Does note some dry skin.  Notes her weight goes up and down.  Does note some possible cold intolerance.  No heat intolerance.  She notes decreased energy levels.  She does not fall asleep very easily during the day.  She does wake up well rested.  No snoring.  No apnea.  No rectal bleeding.  Does note some depression.  Notes this is related to the loss of her daughter-in-law.  She does not want anything for this.  She is unsure if she would like to see a therapist.  No SI.  She does report a rash for 3 months on her arms and chest.  It itches at night.  No new medications.  She has not tried anything for this other than lotion. Appears to be actinic keratoses.  Chronic pain: Followed by pain management.  She got one injection and it helped.  Physical therapy possibly made it worse.  She does take tramadol which is helpful.  No drowsiness from this.  Social History   Tobacco Use  Smoking Status Never Smoker  Smokeless Tobacco Never Used     ROS see history of present illness  Objective  Physical Exam Vitals:   06/04/17 1405  BP: 100/70  Pulse: 72  Temp: 97.7 F (36.5 C)  SpO2: 97%    BP Readings from Last 3 Encounters:  06/04/17 100/70  04/24/17 (!) 130/97  04/09/17 109/79   Wt Readings from Last 3 Encounters:  06/04/17 113 lb (51.3 kg)  04/24/17 108 lb (49 kg)  04/09/17 111 lb (50.3 kg)    Physical Exam  Constitutional: No distress.  Cardiovascular: Normal rate, regular rhythm and normal heart sounds.  Pulmonary/Chest: Effort normal and breath sounds normal.  Musculoskeletal: She exhibits no edema.  Neurological: She is alert.  Skin: Skin is warm and dry. She is not diaphoretic.  Scattered actinic keratoses     Assessment/Plan: Please see individual problem list.  Hypothyroidism Check TSH as this may be  contributing to her decreased energy level.  Actinic keratoses Refer to [facility] dermatology.  Chronic fatigue Likely multifactorial with age, depression, hypothyroidism, pain, or anemia playing a role.  We will check lab work as outlined below.  Determine other steps in management following lab results.  Recurrent major depressive disorder, in partial remission ([place]) Relatively stable.  Discussed that she could see hospice for grief counseling.  Her son is already seeing them and I noted she could contact them as well.   [name] was seen today for follow-up and rash.  Diagnoses and all orders for this visit:  Hypothyroidism, unspecified type -     TSH  Chronic fatigue -     CBC -     Comp Met (CMET) -     B12  Actinic keratoses -     Ambulatory referral to Dermatology  Recurrent major depressive disorder, in partial remission ([place])    Orders Placed This Encounter  Procedures  . TSH  . CBC  . Comp Met (CMET)  . B12  . Ambulatory referral to Dermatology    Referral Priority:   Routine    Referral Type:   Consultation    Referral Reason:   Specialty Services Required    Requested Specialty:   Dermatology    Number of Visits Requested:   1    No orders of the defined  types were placed in this encounter.    [name], MD [place]

## 2017-06-04 NOTE — Patient Instructions (Signed)
Nice to see you. We will refer you to dermatology. We will check lab work today and call you with the results.

## 2017-06-05 ENCOUNTER — Other Ambulatory Visit: Payer: Self-pay | Admitting: Family Medicine

## 2017-06-05 DIAGNOSIS — R748 Abnormal levels of other serum enzymes: Secondary | ICD-10-CM

## 2017-06-26 DIAGNOSIS — H3530 Unspecified macular degeneration: Secondary | ICD-10-CM | POA: Diagnosis not present

## 2017-06-26 DIAGNOSIS — H53413 Scotoma involving central area, bilateral: Secondary | ICD-10-CM | POA: Diagnosis not present

## 2017-07-03 DIAGNOSIS — L820 Inflamed seborrheic keratosis: Secondary | ICD-10-CM | POA: Diagnosis not present

## 2017-07-03 DIAGNOSIS — L570 Actinic keratosis: Secondary | ICD-10-CM | POA: Diagnosis not present

## 2017-07-10 ENCOUNTER — Telehealth: Payer: Self-pay

## 2017-07-10 NOTE — Telephone Encounter (Signed)
Please schedule pt for a procedure, Lumbar facet si joint

## 2017-07-10 NOTE — Telephone Encounter (Signed)
Back is hurting and wants to be seen for procedure

## 2017-07-11 ENCOUNTER — Telehealth: Payer: Self-pay | Admitting: *Deleted

## 2017-07-11 ENCOUNTER — Other Ambulatory Visit (INDEPENDENT_AMBULATORY_CARE_PROVIDER_SITE_OTHER): Payer: Medicare Other

## 2017-07-11 ENCOUNTER — Ambulatory Visit: Payer: Medicare Other

## 2017-07-11 DIAGNOSIS — R748 Abnormal levels of other serum enzymes: Secondary | ICD-10-CM | POA: Diagnosis not present

## 2017-07-11 DIAGNOSIS — R7303 Prediabetes: Secondary | ICD-10-CM | POA: Diagnosis not present

## 2017-07-11 LAB — VITAMIN B12: VITAMIN B 12: 807 pg/mL (ref 211–911)

## 2017-07-11 NOTE — Telephone Encounter (Signed)
Ordered

## 2017-07-11 NOTE — Telephone Encounter (Signed)
Pt came in for labs this afternoon & asked the I see if she could have her hemoglobin A1C checked too. Please place future order if agreeable.

## 2017-07-12 LAB — BILIRUBIN, FRACTIONATED(TOT/DIR/INDIR)
Bilirubin, Direct: 0.2 mg/dL (ref 0.0–0.2)
Indirect Bilirubin: 0.6 mg/dL (ref 0.2–1.2)
Total Bilirubin: 0.8 mg/dL (ref 0.2–1.2)

## 2017-07-12 LAB — HEMOGLOBIN A1C: Hgb A1c MFr Bld: 5.9 % (ref 4.6–6.5)

## 2017-07-12 NOTE — Addendum Note (Signed)
Addended by: Leeanne Rio on: 07/12/2017 10:40 AM   Modules accepted: Orders

## 2017-07-18 ENCOUNTER — Encounter: Payer: Self-pay | Admitting: Pain Medicine

## 2017-07-18 ENCOUNTER — Ambulatory Visit
Admission: RE | Admit: 2017-07-18 | Discharge: 2017-07-18 | Disposition: A | Discharge status: Home / Self Care | Payer: Medicare Other | Source: Ambulatory Visit | Attending: Pain Medicine | Admitting: Pain Medicine

## 2017-07-18 ENCOUNTER — Ambulatory Visit (HOSPITAL_BASED_OUTPATIENT_CLINIC_OR_DEPARTMENT_OTHER): Payer: Medicare Other | Admitting: Pain Medicine

## 2017-07-18 VITALS — BP 150/65 | HR 42 | Temp 97.5°F | Resp 16 | Ht 59.5 in | Wt 110.0 lb

## 2017-07-18 DIAGNOSIS — Z91040 Latex allergy status: Secondary | ICD-10-CM | POA: Insufficient documentation

## 2017-07-18 DIAGNOSIS — G8929 Other chronic pain: Secondary | ICD-10-CM

## 2017-07-18 DIAGNOSIS — M545 Low back pain: Secondary | ICD-10-CM

## 2017-07-18 DIAGNOSIS — M1288 Other specific arthropathies, not elsewhere classified, other specified site: Secondary | ICD-10-CM | POA: Diagnosis not present

## 2017-07-18 DIAGNOSIS — Z885 Allergy status to narcotic agent status: Secondary | ICD-10-CM | POA: Insufficient documentation

## 2017-07-18 DIAGNOSIS — Z79890 Hormone replacement therapy (postmenopausal): Secondary | ICD-10-CM | POA: Insufficient documentation

## 2017-07-18 DIAGNOSIS — F419 Anxiety disorder, unspecified: Secondary | ICD-10-CM | POA: Insufficient documentation

## 2017-07-18 DIAGNOSIS — R001 Bradycardia, unspecified: Secondary | ICD-10-CM

## 2017-07-18 DIAGNOSIS — M488X6 Other specified spondylopathies, lumbar region: Secondary | ICD-10-CM | POA: Insufficient documentation

## 2017-07-18 DIAGNOSIS — Z79899 Other long term (current) drug therapy: Secondary | ICD-10-CM | POA: Insufficient documentation

## 2017-07-18 DIAGNOSIS — M47816 Spondylosis without myelopathy or radiculopathy, lumbar region: Secondary | ICD-10-CM

## 2017-07-18 DIAGNOSIS — Z7901 Long term (current) use of anticoagulants: Secondary | ICD-10-CM | POA: Diagnosis not present

## 2017-07-18 DIAGNOSIS — Z881 Allergy status to other antibiotic agents status: Secondary | ICD-10-CM | POA: Diagnosis not present

## 2017-07-18 DIAGNOSIS — Z90710 Acquired absence of both cervix and uterus: Secondary | ICD-10-CM | POA: Diagnosis not present

## 2017-07-18 DIAGNOSIS — M533 Sacrococcygeal disorders, not elsewhere classified: Secondary | ICD-10-CM

## 2017-07-18 DIAGNOSIS — Z9849 Cataract extraction status, unspecified eye: Secondary | ICD-10-CM | POA: Diagnosis not present

## 2017-07-18 MED ORDER — MIDAZOLAM HCL 5 MG/5ML IJ SOLN
1.0000 mg | INTRAMUSCULAR | Status: DC | PRN
  Administered 2017-07-18: 0.5 mg via INTRAVENOUS
  Filled 2017-07-18: qty 5

## 2017-07-18 MED ORDER — FENTANYL CITRATE (PF) 100 MCG/2ML IJ SOLN
25.0000 ug | INTRAMUSCULAR | Status: DC | PRN
  Administered 2017-07-18: 25 ug via INTRAVENOUS
  Filled 2017-07-18: qty 2

## 2017-07-18 MED ORDER — LIDOCAINE HCL 2 % IJ SOLN
10.0000 mL | Freq: Once | INTRAMUSCULAR | Status: AC
  Administered 2017-07-18: 200 mg
  Filled 2017-07-18: qty 40

## 2017-07-18 MED ORDER — METHYLPREDNISOLONE ACETATE 80 MG/ML IJ SUSP
80.0000 mg | Freq: Once | INTRAMUSCULAR | Status: AC
  Administered 2017-07-18: 80 mg via INTRA_ARTICULAR
  Filled 2017-07-18: qty 1

## 2017-07-18 MED ORDER — ROPIVACAINE HCL 2 MG/ML IJ SOLN
4.0000 mL | Freq: Once | INTRAMUSCULAR | Status: AC
  Administered 2017-07-18: 4 mL via INTRA_ARTICULAR
  Filled 2017-07-18: qty 10

## 2017-07-18 MED ORDER — LACTATED RINGERS IV SOLN
1000.0000 mL | Freq: Once | INTRAVENOUS | Status: AC
  Administered 2017-07-18: 1000 mL via INTRAVENOUS

## 2017-07-18 MED ORDER — TRIAMCINOLONE ACETONIDE 40 MG/ML IJ SUSP
40.0000 mg | Freq: Once | INTRAMUSCULAR | Status: AC
  Administered 2017-07-18: 40 mg
  Filled 2017-07-18: qty 1

## 2017-07-18 MED ORDER — ROPIVACAINE HCL 2 MG/ML IJ SOLN
9.0000 mL | Freq: Once | INTRAMUSCULAR | Status: AC
  Administered 2017-07-18: 9 mL via PERINEURAL
  Filled 2017-07-18: qty 10

## 2017-07-18 MED ORDER — GLYCOPYRROLATE 0.2 MG/ML IJ SOLN
INTRAMUSCULAR | Status: AC
  Filled 2017-07-18: qty 1

## 2017-07-18 NOTE — Progress Notes (Signed)
Patient's Name: Amanda Soto  MRN: [id number]  Referring Provider: [name], MD  DOB: 01-05-1925  PCP: [name], MD  DOS: 07/18/2017  Note by: Gaspar Cola, MD  Service setting: Ambulatory outpatient  Specialty: Interventional Pain Management  Patient type: Established  Location: [facility] (AMB) Pain Management Facility  Visit type: Interventional Procedure   Primary Reason for Visit: Interventional Pain Management Treatment. CC: Back Pain (lower back right side)  Procedure:  Anesthesia, Analgesia, Anxiolysis:  Procedure #1: Type: Diagnostic Medial Branch Facet Block #2  Region: Lumbar Level: L2, L3, L4, L5, & S1 Medial Branch Level(s) Laterality: Right  Procedure #2: Type: Diagnostic Sacroiliac Joint Block #2  Region: Posterior Lumbosacral Level: PSIS (Posterior Superior Iliac Spine) Sacroiliac Joint Laterality: Right  Type: Local Anesthesia with Moderate (Conscious) Sedation Local Anesthetic: Lidocaine 1% Route: Intravenous (IV) IV Access: Secured Sedation: Meaningful verbal contact was maintained at all times during the procedure  Indication(s): Analgesia and Anxiety   Indications: 1. Lumbar facet syndrome (Right)   2. Lumbar facet arthropathy   3. Chronic sacroiliac joint pain (Right)   4. Chronic low back pain (Primary Source of Pain) (Right)   5. Facet syndrome, lumbar   6. Chronic right sacroiliac joint pain   7. Chronic bilateral low back pain without sciatica   8. Bradycardia    Pain Score: Pre-procedure: 10-Worst pain ever/10 Post-procedure: 0-No pain/10  Pre-op Assessment:  Amanda Soto is a 84 y.o. (year old), female patient, seen today for interventional treatment. She  has a past surgical history that includes Cataract extraction; Partial hysterectomy; Appendectomy; and Tonsillectomy. Amanda Soto has a current medication list which includes the following prescription(s): acetaminophen, apixaban, atorvastatin, cyanocobalamin, furosemide,  levothyroxine, multivitamin, ocuvite eye health formula, sotalol, and tramadol, and the following Facility-Administered Medications: fentanyl, lactated ringers, and midazolam. Her primarily concern today is the Back Pain (lower back right side)  Initial Vital Signs: There were no vitals taken for this visit. BMI: Estimated body mass index is 21.85 kg/m as calculated from the following:   Height as of this encounter: 4' 11.5" (1.511 m).   Weight as of this encounter: 110 lb (49.9 kg).  Risk Assessment: Allergies: Reviewed. She is allergic to hydrocodone; ciprofloxacin; and latex.  Allergy Precautions: None required Coagulopathies: Reviewed. None identified.  Blood-thinner therapy: None at this time Active Infection(s): Reviewed. None identified. Amanda Soto is afebrile  Site Confirmation: Amanda Soto was asked to confirm the procedure and laterality before marking the site Procedure checklist: Completed Consent: Before the procedure and under the influence of no sedative(s), amnesic(s), or anxiolytics, the patient was informed of the treatment options, risks and possible complications. To fulfill our ethical and legal obligations, as recommended by the American Medical Association's Code of Ethics, I have informed the patient of my clinical impression; the nature and purpose of the treatment or procedure; the risks, benefits, and possible complications of the intervention; the alternatives, including doing nothing; the risk(s) and benefit(s) of the alternative treatment(s) or procedure(s); and the risk(s) and benefit(s) of doing nothing. The patient was provided information about the general risks and possible complications associated with the procedure. These may include, but are not limited to: failure to achieve desired goals, infection, bleeding, organ or nerve damage, allergic reactions, paralysis, and death. In addition, the patient was informed of those risks and complications associated to  Spine-related procedures, such as failure to decrease pain; infection (i.e.: Meningitis, epidural or intraspinal abscess); bleeding (i.e.: epidural hematoma, subarachnoid hemorrhage, or any other  type of intraspinal or peri-dural bleeding); organ or nerve damage (i.e.: Any type of peripheral nerve, nerve root, or spinal cord injury) with subsequent damage to sensory, motor, and/or autonomic systems, resulting in permanent pain, numbness, and/or weakness of one or several areas of the body; allergic reactions; (i.e.: anaphylactic reaction); and/or death. Furthermore, the patient was informed of those risks and complications associated with the medications. These include, but are not limited to: allergic reactions (i.e.: anaphylactic or anaphylactoid reaction(s)); adrenal axis suppression; blood sugar elevation that in diabetics may result in ketoacidosis or comma; water retention that in patients with history of congestive heart failure may result in shortness of breath, pulmonary edema, and decompensation with resultant heart failure; weight gain; swelling or edema; medication-induced neural toxicity; particulate matter embolism and blood vessel occlusion with resultant organ, and/or nervous system infarction; and/or aseptic necrosis of one or more joints. Finally, the patient was informed that Medicine is not an exact science; therefore, there is also the possibility of unforeseen or unpredictable risks and/or possible complications that may result in a catastrophic outcome. The patient indicated having understood very clearly. We have given the patient no guarantees and we have made no promises. Enough time was given to the patient to ask questions, all of which were answered to the patient's satisfaction. Amanda Soto has indicated that she wanted to continue with the procedure. Attestation: I, the ordering provider, attest that I have discussed with the patient the benefits, risks, side-effects, alternatives,  likelihood of achieving goals, and potential problems during recovery for the procedure that I have provided informed consent. Date: 07/18/2017; Time: 6:48 AM  Pre-Procedure Preparation:  Monitoring: As per clinic protocol. Respiration, ETCO2, SpO2, BP, heart rate and rhythm monitor placed and checked for adequate function Safety Precautions: Patient was assessed for positional comfort and pressure points before starting the procedure. Time-out: I initiated and conducted the "Time-out" before starting the procedure, as per protocol. The patient was asked to participate by confirming the accuracy of the "Time Out" information. Verification of the correct person, site, and procedure were performed and confirmed by me, the nursing staff, and the patient. "Time-out" conducted as per Joint Commission's Universal Protocol (UP.01.01.01). "Time-out" Date & Time: 07/18/2017; 0930 hrs.  Description of Procedure #1 Process:   Time-out: "Time-out" completed before starting procedure, as per protocol. Position: Prone Target Area: For Lumbar Facet blocks, the target is the groove formed by the junction of the transverse process and superior articular process. For the L5 dorsal ramus, the target is the notch between superior articular process and sacral ala. For the S1 dorsal ramus, the target is the superior and lateral edge of the posterior S1 Sacral foramen. Approach: Paramedial approach. Area Prepped: Entire Posterior Lumbosacral Region Prepping solution: ChloraPrep (2% chlorhexidine gluconate and 70% isopropyl alcohol) Safety Precautions: Aspiration looking for blood return was conducted prior to all injections. At no point did we inject any substances, as a needle was being advanced. No attempts were made at seeking any paresthesias. Safe injection practices and needle disposal techniques used. Medications properly checked for expiration dates. SDV (single dose vial) medications used.  Description of the  Procedure: Protocol guidelines were followed. The patient was placed in position over the fluoroscopy table. The target area was identified and the area prepped in the usual manner. Skin desensitized using vapocoolant spray. Skin & deeper tissues infiltrated with local anesthetic. Appropriate amount of time allowed to pass for local anesthetics to take effect. The procedure needle was introduced through the skin,  ipsilateral to the reported pain, and advanced to the target area. Employing the "Medial Branch Technique", the needles were advanced to the angle made by the superior and medial portion of the transverse process, and the lateral and inferior portion of the superior articulating process of the targeted vertebral bodies. This area is known as "Burton's Eye" or the "Eye of the [place] Dog". A procedure needle was introduced through the skin, and this time advanced to the angle made by the superior and medial border of the sacral ala, and the lateral border of the S1 vertebral body. This last needle was later repositioned at the superior and lateral border of the posterior S1 foramen. Negative aspiration confirmed. Solution injected in intermittent fashion, asking for systemic symptoms every 0.5cc of injectate. The needles were then removed and the area cleansed, making sure to leave some of the prepping solution back to take advantage of its long term bactericidal properties. Start Time: 0930 hrs. Materials:  Needle(s) Type: Regular needle Gauge: 22G Length: 3.5-in Medication(s): We administered midazolam, fentaNYL, lactated ringers, lidocaine, ropivacaine (PF) 2 mg/mL (0.2%), triamcinolone acetonide, ropivacaine (PF) 2 mg/mL (0.2%), and methylPREDNISolone acetate. Please see chart orders for dosing details.  Description of Procedure # 2 Process:   Position: Prone Target Area: For upper sacroiliac joint block(s), the target is the superior and posterior margin of the sacroiliac joint. Approach:  Ipsilateral approach. Area Prepped: Entire Posterior Lumbosacral Region Prepping solution: ChloraPrep (2% chlorhexidine gluconate and 70% isopropyl alcohol) Safety Precautions: Aspiration looking for blood return was conducted prior to all injections. At no point did we inject any substances, as a needle was being advanced. No attempts were made at seeking any paresthesias. Safe injection practices and needle disposal techniques used. Medications properly checked for expiration dates. SDV (single dose vial) medications used. Description of the Procedure: Protocol guidelines were followed. The patient was placed in position over the fluoroscopy table. The target area was identified and the area prepped in the usual manner. Skin desensitized using vapocoolant spray. Skin & deeper tissues infiltrated with local anesthetic. Appropriate amount of time allowed to pass for local anesthetics to take effect. The procedure needle was advanced under fluoroscopic guidance into the sacroiliac joint until a firm endpoint was obtained. Proper needle placement secured. Negative aspiration confirmed. Solution injected in intermittent fashion, asking for systemic symptoms every 0.5cc of injectate. The needles were then removed and the area cleansed, making sure to leave some of the prepping solution back to take advantage of its long term bactericidal properties. Vitals:   07/18/17 1010 07/18/17 1020 07/18/17 1030 07/18/17 1040  BP: (!) 125/51 (!) 133/58 (!) 139/59 (!) 150/65  Pulse:      Resp: 18 16 16 16   Temp:      TempSrc:      SpO2: 96% 98% 97% 96%  Weight:      Height:        End Time: 0941 hrs. Materials:  Needle(s) Type: Regular needle Gauge: 22G Length: 3.5-in Medication(s): We administered midazolam, fentaNYL, lactated ringers, lidocaine, ropivacaine (PF) 2 mg/mL (0.2%), triamcinolone acetonide, ropivacaine (PF) 2 mg/mL (0.2%), and methylPREDNISolone acetate. Please see chart orders for dosing  details.  Imaging Guidance (Spinal):  Type of Imaging Technique: Fluoroscopy Guidance (Spinal) Indication(s): Assistance in needle guidance and placement for procedures requiring needle placement in or near specific anatomical locations not easily accessible without such assistance. Exposure Time: Please see nurses notes. Contrast: None used. Fluoroscopic Guidance: I was personally present during the use of fluoroscopy. "Tunnel  Vision Technique" used to obtain the best possible view of the target area. Parallax error corrected before commencing the procedure. "Direction-depth-direction" technique used to introduce the needle under continuous pulsed fluoroscopy. Once target was reached, antero-posterior, oblique, and lateral fluoroscopic projection used confirm needle placement in all planes. Images permanently stored in EMR. Interpretation: No contrast injected. I personally interpreted the imaging intraoperatively. Adequate needle placement confirmed in multiple planes. Permanent images saved into the patient's record.  Antibiotic Prophylaxis:  Indication(s): None identified Antibiotic given: None  Post-operative Assessment:  EBL: None Complications: No immediate post-treatment complications observed by team, or reported by patient. Note: The patient tolerated the entire procedure well. A repeat set of vitals were taken after the procedure and the patient was kept under observation following institutional policy, for this type of procedure. Post-procedural neurological assessment was performed, showing return to baseline, prior to discharge. The patient was provided with post-procedure discharge instructions, including a section on how to identify potential problems. Should any problems arise concerning this procedure, the patient was given instructions to immediately contact us, at any time, without hesitation. In any case, we plan to contact the patient by telephone for a follow-up status report  regarding this interventional procedure. Comments:  No additional relevant information.  Plan of Care    Imaging Orders     DG C-Arm 1-60 Min-No Report  Procedure Orders     LUMBAR FACET(MEDIAL BRANCH NERVE BLOCK) MBNB     SACROILIAC JOINT INJECTION  Medications ordered for procedure: Meds ordered this encounter  Medications  . midazolam (VERSED) 5 MG/5ML injection 1-2 mg    Make sure Flumazenil is available in the pyxis when using this medication. If oversedation occurs, administer 0.2 mg IV over 15 sec. If after 45 sec no response, administer 0.2 mg again over 1 min; may repeat at 1 min intervals; not to exceed 4 doses (1 mg)  . fentaNYL (SUBLIMAZE) injection 25-50 mcg    Make sure Narcan is available in the pyxis when using this medication. In the event of respiratory depression (RR< 8/min): Titrate NARCAN (naloxone) in increments of 0.1 to 0.2 mg IV at 2-3 minute intervals, until desired degree of reversal.  . lactated ringers infusion 1,000 mL  . lidocaine (XYLOCAINE) 2 % (with pres) injection 200 mg  . ropivacaine (PF) 2 mg/mL (0.2%) (NAROPIN) injection 9 mL  . triamcinolone acetonide (KENALOG-40) injection 40 mg  . ropivacaine (PF) 2 mg/mL (0.2%) (NAROPIN) injection 4 mL  . methylPREDNISolone acetate (DEPO-MEDROL) injection 80 mg   Medications administered: We administered midazolam, fentaNYL, lactated ringers, lidocaine, ropivacaine (PF) 2 mg/mL (0.2%), triamcinolone acetonide, ropivacaine (PF) 2 mg/mL (0.2%), and methylPREDNISolone acetate.  See the medical record for exact dosing, route, and time of administration.  New Prescriptions   No medications on file   Disposition: Discharge home  Discharge Date & Time: 07/18/2017; 1050 hrs.   Physician-requested Follow-up: Return for post-procedure eval (2 wks), w/ Dr. Dossie Arbour.  Future Appointments  Date Time Provider [place]  07/26/2017  1:00 PM Dia Crawford, LPN [facility]  09/03/3612  1:30 PM Vevelyn Francois, NP [facility] None  08/19/2017 11:15 AM Milinda Pointer, MD [facility] None  12/03/2017  2:45 PM Caryl Bis Angela Adam, MD [facility]   Primary Care Physician: [name], MD Location: [facility] Note by: Gaspar Cola, MD Date: 07/18/2017; Time: 10:45 AM  Disclaimer:  Medicine is not an Chief Strategy Officer. The only guarantee in medicine is that nothing is  guaranteed. It is important to note that the decision to proceed with this intervention was based on the information collected from the patient. The Data and conclusions were drawn from the patient's questionnaire, the interview, and the physical examination. Because the information was provided in large part by the patient, it cannot be guaranteed that it has not been purposely or unconsciously manipulated. Every effort has been made to obtain as much relevant data as possible for this evaluation. It is important to note that the conclusions that lead to this procedure are derived in large part from the available data. Always take into account that the treatment will also be dependent on availability of resources and existing treatment guidelines, considered by other Pain Management Practitioners as being common knowledge and practice, at the time of the intervention. For Medico-Legal purposes, it is also important to point out that variation in procedural techniques and pharmacological choices are the acceptable norm. The indications, contraindications, technique, and results of the above procedure should only be interpreted and judged by a Board-Certified Interventional Pain Specialist with extensive familiarity and expertise in the same exact procedure and technique.

## 2017-07-18 NOTE — Patient Instructions (Signed)

## 2017-07-19 ENCOUNTER — Telehealth: Payer: Self-pay | Admitting: *Deleted

## 2017-07-19 NOTE — Telephone Encounter (Signed)
Attempted to reach patient for post procedure phone call, no answer and no voicemail capability

## 2017-07-26 ENCOUNTER — Ambulatory Visit: Payer: Medicare Other

## 2017-07-26 ENCOUNTER — Ambulatory Visit (INDEPENDENT_AMBULATORY_CARE_PROVIDER_SITE_OTHER): Payer: Medicare Other

## 2017-07-26 ENCOUNTER — Ambulatory Visit (INDEPENDENT_AMBULATORY_CARE_PROVIDER_SITE_OTHER): Payer: Medicare Other | Admitting: Family Medicine

## 2017-07-26 VITALS — BP 112/62 | HR 41

## 2017-07-26 VITALS — BP 112/62 | HR 46 | Temp 98.3°F | Resp 14 | Ht <= 58 in | Wt 112.8 lb

## 2017-07-26 DIAGNOSIS — Z0000 Encounter for general adult medical examination without abnormal findings: Secondary | ICD-10-CM

## 2017-07-26 DIAGNOSIS — R001 Bradycardia, unspecified: Secondary | ICD-10-CM | POA: Diagnosis not present

## 2017-07-26 NOTE — Patient Instructions (Addendum)
  Ms. Zapf , Thank you for taking time to come for your Medicare Wellness Visit. I appreciate your ongoing commitment to your health goals. Please review the following plan we discussed and let me know if I can assist you in the future.   Follow up with Dr. Caryl Bis.    Bring a copy of your [place] and/or Living Will to be scanned into chart.  Have a great day!  These are the goals we discussed: Goals    . Healthy Lifestyle     Stay hydrated Chair exercises Low carb foods       This is a list of the screening recommended for you and due dates:  Health Maintenance  Topic Date Due  . Tetanus Vaccine  02/10/1944  . Pneumonia vaccines (1 of 2 - PCV13) 02/11/1992  . Flu Shot  Completed  . DEXA scan (bone density measurement)  Completed

## 2017-07-26 NOTE — Progress Notes (Signed)
  [name], MD Phone: [phone]  Amanda Soto is a 84 y.o. female who presents today for same day visit.  Patient seen today for wellness visit and noted to be bradycardic on vitals and exam by LPN. Patient noted her chronic tiredness and fatigue that is unchanged. She notes no palpitations, chest pain, shortness of breath, light headedness, fever, abdominal pain, syncope, or other symptoms.   Social History   Tobacco Use  Smoking Status Never Smoker  Smokeless Tobacco Never Used     ROS see history of present illness  Objective  Physical Exam Vitals:   07/26/17 1443  BP: 112/62  Pulse: (!) 41  see wellness visit note for other vitals  BP Readings from Last 3 Encounters:  07/26/17 112/62  07/26/17 112/62  07/18/17 (!) 150/65   Wt Readings from Last 3 Encounters:  07/26/17 112 lb 12.8 oz (51.2 kg)  07/18/17 110 lb (49.9 kg)  06/04/17 113 lb (51.3 kg)    Physical Exam  Constitutional: No distress.  Cardiovascular: Regular rhythm and normal heart sounds. Bradycardia present.  Pulmonary/Chest: Effort normal and breath sounds normal.  Neurological: She is alert.  Skin: Skin is warm and dry. She is not diaphoretic.   EKG: Marked sinus bradycardia with a rate of 41, chronic left bundle branch block noted, no other changes  Assessment/Plan: Please see individual problem list.  Bradycardia Chronic intermittent issue.  Noted today on vitals.  I spoke with her cardiologist who noted that she intermittently has bradycardia issues and then at other times will be up into the 140s.  I advised him that the patient was asymptomatic other than her typical chronic fatigue.  He noted as long as she was not symptomic and her heart rate was not less than 40 he would not plan on doing anything for this at this time.  He noted he would see her in the office on Monday.  [name] contacted his office to get that set up for the patient.  I discussed with the patient that if she were  to become symptomatic she would need to be evaluated over the weekend.  She was given return precautions.   Orders Placed This Encounter  Procedures  . EKG 12-Lead    No orders of the defined types were placed in this encounter.    [name], MD [name]

## 2017-07-26 NOTE — Progress Notes (Signed)
Subjective:   Amanda Soto is a 84 y.o. female who presents for Medicare Annual (Subsequent) preventive examination.  Review of Systems:  No ROS.  Medicare Wellness Visit. Additional risk factors are reflected in the social history.  Cardiac Risk Factors include: advanced age (>63men, >71 women);hypertension     Objective:     Vitals: BP 112/62 (BP Location: Left Arm, Patient Position: Sitting, Cuff Size: Normal)   Pulse (!) 46   Temp 98.3 F (36.8 C) (Oral)   Resp 14   Ht 4\' 9"  (1.448 m)   Wt 112 lb 12.8 oz (51.2 kg)   SpO2 97%   BMI 24.41 kg/m   Body mass index is 24.41 kg/m.  Advanced Directives 07/26/2017 04/24/2017 04/09/2017 04/04/2017 01/28/2017 12/17/2016 08/01/2016  Does Patient Have a Medical Advance Directive? Yes Yes Yes Yes Yes Yes Yes  Type of Paramedic of [place];Living will Living will [place];Living will [place];Living will [place];Living will [place];Living will -  Does patient want to make changes to medical advance directive? - - - No - Patient declined - No - Patient declined -  Copy of [place] in Chart? No - copy requested - - Yes - - -  Would patient like information on creating a medical advance directive? - - - - - - -    Tobacco Social History   Tobacco Use  Smoking Status Never Smoker  Smokeless Tobacco Never Used     Counseling given: Not Answered   Clinical Intake:  Pre-visit preparation completed: Yes  Pain : No/denies pain     Nutritional Status: BMI of 19-24  Normal Diabetes: No  How often do you need to have someone help you when you read instructions, pamphlets, or other written materials from your doctor or pharmacy?: 3 - Sometimes  Interpreter Needed?: No     Past Medical History:  Diagnosis Date  . Acute postoperative pain 12/17/2016  . Arthritis   . Atrial fibrillation ([place])   . CHF  (congestive heart failure) ([place])   . Chickenpox   . Chronic abdominal pain 01/16/2014  . Degenerative arthritis of hip 08/19/2014  . Degenerative arthritis of lumbar spine 11/18/2013  . Depression   . Diverticulitis   . GERD (gastroesophageal reflux disease)   . Heart murmur   . Hyperlipidemia   . Hypertension   . Hypothyroid   . Neuritis or radiculitis due to rupture of lumbar intervertebral disc 11/18/2013  . Skin cancer   . Trochanteric bursitis of right hip 11/18/2013   Past Surgical History:  Procedure Laterality Date  . APPENDECTOMY    . CATARACT EXTRACTION    . PARTIAL HYSTERECTOMY    . TONSILLECTOMY     Family History  Problem Relation Age of Onset  . Stroke Maternal Grandmother   . Breast cancer Mother   . Breast cancer Sister   . Heart disease Unknown        Parent, grandparent, sons  . Hypertension Unknown        Parent, grandparent   Social History   Socioeconomic History  . Marital status: Widowed    Spouse name: None  . Number of children: None  . Years of education: None  . Highest education level: None  Social Needs  . Financial resource strain: None  . Food insecurity - worry: None  . Food insecurity - inability: None  . Transportation needs - medical: None  .  Transportation needs - non-medical: None  Occupational History  . None  Tobacco Use  . Smoking status: Never Smoker  . Smokeless tobacco: Never Used  Substance and Sexual Activity  . Alcohol use: Yes    Alcohol/week: 0.6 oz    Types: 1 Glasses of wine per week    Comment: rare  . Drug use: No  . Sexual activity: No  Other Topics Concern  . None  Social History Narrative  . None    Outpatient Encounter Medications as of 07/26/2017  Medication Sig  . acetaminophen (TYLENOL) 325 MG tablet Take 650 mg by mouth every 6 (six) hours as needed for moderate pain, fever or headache.  Marland Kitchen apixaban (ELIQUIS) 2.5 MG TABS tablet Take 1 tablet (2.5 mg total) by mouth 2 (two) times daily.  Marland Kitchen  atorvastatin (LIPITOR) 10 MG tablet Take 1 tablet (10 mg total) by mouth daily.  . Cyanocobalamin (VITAMIN B 12 PO) Take 1,000 mg by mouth daily.  . furosemide (LASIX) 20 MG tablet Take 1 tablet (20 mg total) by mouth daily.  Marland Kitchen levothyroxine (SYNTHROID) 50 MCG tablet Take 1 tablet (50 mcg total) by mouth daily before breakfast. (Patient taking differently: Take 50 mcg by mouth daily before breakfast. Name brand only)  . Multiple Vitamin (MULTIVITAMIN) capsule Take 1 capsule by mouth daily.   . Multiple Vitamins-Minerals (OCUVITE EYE HEALTH FORMULA) CAPS Take 1 capsule by mouth daily.  . sotalol (BETAPACE) 80 MG tablet Take 40 mg by mouth 2 (two) times daily.   . traMADol (ULTRAM) 50 MG tablet Take 1 tablet (50 mg total) by mouth every 6 (six) hours as needed.   No facility-administered encounter medications on file as of 07/26/2017.     Activities of Daily Living In your present state of health, do you have any difficulty performing the following activities: 07/26/2017  Hearing? N  Vision? Y  Comment Macular degeneration  Difficulty concentrating or making decisions? N  Walking or climbing stairs? Y  Comment Unsteady gait  Dressing or bathing? N  Doing errands, shopping? Y  Comment She does not Physiological scientist and eating ? N  Using the Toilet? N  In the past six months, have you accidently leaked urine? N  Do you have problems with loss of bowel control? N  Managing your Medications? N  Managing your Finances? N  Housekeeping or managing your Housekeeping? Y  Comment Family assist as needed  Some recent data might be hidden    Patient Care Team: [name], MD as PCP - General (Family Medicine) Yolonda Kida, MD as Consulting Physician (Cardiology)    Assessment:   This is a routine wellness examination for Amanda Soto. The goal of the wellness visit is to assist the patient how to close the gaps in care and create a preventative care plan for the patient.  Information also received from granddaughter Amanda Soto), HIPAA compliant.   The roster of all physicians providing medical care to patient is listed in the Snapshot section of the chart.  Osteoporosis reviewed.    Safety issues reviewed; Lives alone.  Life alert, smoke and carbon monoxide detectors in the home. No firearms in the home.  Wears seatbelts when riding with others. Patient does wear sunscreen or protective clothing when in direct sunlight. No violence in the home.  Patient is alert, normal appearance, oriented to person/place/and time. Correctly identified the president of the [place], recall of 3/3 words, and performing simple calculations. Displays appropriate judgement and can read  correct time from watch face.   No new identified risk were noted.  No failures at ADL's or IADL's.  Ambulates with walker.  Granddaughter assists as needed.   BMI- discussed the importance of a healthy diet, water intake and the benefits of aerobic exercise. Educational material provided. She has a healthy diet, adequate water intake and plans to increase her physical activity with chair/standing exercise.  24 hour diet recall: Regular diet  Dental- every 6 months.  Dr. Lynelle Smoke.  Eye- Visual acuity not assessed per patient preference since they have regular follow up with the ophthalmologist.  Macular degeneration.  Primary ophthalmologist is changed to [facility].   Sleep patterns- Sleeps 8-10 hours at night.     TDAP and PNA vaccine deferred per patient preference.   Patient Concerns: Complaints of feeling bad.  No pain or SOB. HR 46.  Deferred to PCP for follow up. Seen by PCP today.   Medications- taking all scheduled medications as directed.  Some uncertainty of the levothyroxine dose due to pharmacy change of medication brand.  Instructed to bring bottle or call with specific name and dose listed on the bottle for clarification as soon as possible.   Exercise Activities and Dietary  recommendations Current Exercise Habits: The patient does not participate in regular exercise at present Goals    . Healthy Lifestyle     Stay hydrated Chair exercises Low carb foods       Fall Risk Fall Risk  07/26/2017 07/18/2017 04/09/2017 04/04/2017 12/17/2016  Falls in the past year? No No No No Yes  Number falls in past yr: - - - - 1  Comment - - - - fell and hit back from doorknob coming off door  Risk for fall due to : - - - - (No Data)  Risk for fall due to: Comment - - - - door knob came off    Depression Screen PHQ 2/9 Scores 07/26/2017 07/18/2017 04/09/2017 04/04/2017  PHQ - 2 Score 0 0 0 0  PHQ- 9 Score - - - -  Exception Documentation - - - -     Cognitive Function     6CIT Screen 07/26/2017 07/25/2016  What Year? 0 points 0 points  What month? 0 points 0 points  What time? 0 points 0 points  Count back from 20 0 points 0 points  Months in reverse 0 points 0 points  Repeat phrase 0 points -  Total Score 0 -    Immunization History  Administered Date(s) Administered  . Influenza, High Dose Seasonal PF 04/25/2016   Screening Tests Health Maintenance  Topic Date Due  . TETANUS/TDAP  02/10/1944  . PNA vac Low Risk Adult (1 of 2 - PCV13) 02/11/1992  . INFLUENZA VACCINE  Completed  . DEXA SCAN  Completed      Plan:    End of life planning; Advance aging; Advanced directives discussed. Copy of current HCPOA/Living Will requested.    I have personally reviewed and noted the following in the patient's chart:   . Medical and social history . Use of alcohol, tobacco or illicit drugs  . Current medications and supplements . Functional ability and status . Nutritional status . Physical activity . Advanced directives . List of other physicians . Hospitalizations, surgeries, and ER visits in previous 12 months . Vitals . Screenings to include cognitive, depression, and falls . Referrals and appointments  In addition, I have reviewed and discussed with  patient certain preventive protocols, quality metrics,  and best practice recommendations. A written personalized care plan for preventive services as well as general preventive health recommendations were provided to patient.     Varney Biles, LPN  7/90/2409

## 2017-07-26 NOTE — Assessment & Plan Note (Addendum)
Chronic intermittent issue.  Noted today on vitals.  I spoke with her cardiologist who noted that she intermittently has bradycardia issues and then at other times will be up into the 140s.  I advised him that the patient was asymptomatic other than her typical chronic fatigue.  He noted as long as she was not symptomic and her heart rate was not less than 40 he would not plan on doing anything for this at this time.  He noted he would see her in the office on Monday.  Amanda Soto contacted his office to get that set up for the patient.  I discussed with the patient that if she were to become symptomatic she would need to be evaluated over the weekend.  She was given return precautions.

## 2017-07-26 NOTE — Patient Instructions (Signed)
Nice to see you. We will get you set up with your cardiologist. If you develop palpitations, chest pain, shortness of breath, lightheadedness, you pass out, or you develop worsening fatigue please be evaluated in the emergency room.

## 2017-07-29 DIAGNOSIS — Z024 Encounter for examination for driving license: Secondary | ICD-10-CM | POA: Diagnosis not present

## 2017-08-05 ENCOUNTER — Encounter: Payer: Self-pay | Admitting: Nurse Practitioner

## 2017-08-05 ENCOUNTER — Ambulatory Visit: Payer: Medicare Other | Attending: Nurse Practitioner | Admitting: Nurse Practitioner

## 2017-08-05 ENCOUNTER — Other Ambulatory Visit: Payer: Self-pay

## 2017-08-05 VITALS — BP 136/46 | HR 48 | Temp 97.7°F | Resp 16 | Ht <= 58 in | Wt 108.0 lb

## 2017-08-05 DIAGNOSIS — Z90710 Acquired absence of both cervix and uterus: Secondary | ICD-10-CM | POA: Diagnosis not present

## 2017-08-05 DIAGNOSIS — I110 Hypertensive heart disease with heart failure: Secondary | ICD-10-CM | POA: Insufficient documentation

## 2017-08-05 DIAGNOSIS — Z9889 Other specified postprocedural states: Secondary | ICD-10-CM | POA: Diagnosis not present

## 2017-08-05 DIAGNOSIS — Z7901 Long term (current) use of anticoagulants: Secondary | ICD-10-CM | POA: Diagnosis not present

## 2017-08-05 DIAGNOSIS — F419 Anxiety disorder, unspecified: Secondary | ICD-10-CM | POA: Diagnosis not present

## 2017-08-05 DIAGNOSIS — M545 Low back pain: Secondary | ICD-10-CM

## 2017-08-05 DIAGNOSIS — M47816 Spondylosis without myelopathy or radiculopathy, lumbar region: Secondary | ICD-10-CM

## 2017-08-05 DIAGNOSIS — I499 Cardiac arrhythmia, unspecified: Secondary | ICD-10-CM | POA: Diagnosis not present

## 2017-08-05 DIAGNOSIS — R001 Bradycardia, unspecified: Secondary | ICD-10-CM | POA: Diagnosis not present

## 2017-08-05 DIAGNOSIS — Z5181 Encounter for therapeutic drug level monitoring: Secondary | ICD-10-CM | POA: Insufficient documentation

## 2017-08-05 DIAGNOSIS — I480 Paroxysmal atrial fibrillation: Secondary | ICD-10-CM | POA: Diagnosis not present

## 2017-08-05 DIAGNOSIS — I5022 Chronic systolic (congestive) heart failure: Secondary | ICD-10-CM | POA: Insufficient documentation

## 2017-08-05 DIAGNOSIS — Z79899 Other long term (current) drug therapy: Secondary | ICD-10-CM | POA: Diagnosis not present

## 2017-08-05 DIAGNOSIS — G894 Chronic pain syndrome: Secondary | ICD-10-CM | POA: Diagnosis not present

## 2017-08-05 DIAGNOSIS — G8929 Other chronic pain: Secondary | ICD-10-CM | POA: Diagnosis not present

## 2017-08-05 DIAGNOSIS — R5383 Other fatigue: Secondary | ICD-10-CM | POA: Diagnosis not present

## 2017-08-05 MED ORDER — TRAMADOL HCL 50 MG PO TABS: 50.0000 mg | ORAL_TABLET | Freq: Four times a day (QID) | ORAL | 5 refills | Status: DC | PRN

## 2017-08-05 NOTE — Progress Notes (Signed)
Nursing Pain Medication Assessment:  Safety precautions to be maintained throughout the outpatient stay will include: orient to surroundings, keep bed in low position, maintain call bell within reach at all times, provide assistance with transfer out of bed and ambulation.  Medication Inspection Compliance: Pill count conducted under aseptic conditions, in front of the patient. Neither the pills nor the bottle was removed from the patient's sight at any time. Once count was completed pills were immediately returned to the patient in their original bottle.   

## 2017-08-05 NOTE — Progress Notes (Signed)
Patient's Name: Amanda Soto  MRN: [id number]  Referring Provider: [name], MD  DOB: August 09, 1924  PCP: [name], MD  DOS: 08/05/2017  Note by: Vevelyn Francois NP  Service setting: Ambulatory outpatient  Specialty: Interventional Pain Management  Location: [facility] (AMB) Pain Management Facility    Patient type: Established    Primary Reason(s) for Visit: Encounter for prescription drug management & post-procedure evaluation of chronic illness with mild to moderate exacerbation(Level of risk: moderate) CC: Back Pain (lower)  HPI  Amanda Soto is an 84 y.o. year old, female patient, who comes today for a post-procedure evaluation and medication management. She has Chronic systolic heart failure ([place]); Bradycardia; Atrial fibrillation ([place]); HTN (hypertension); Abdominal discomfort; Exertional shortness of breath; Elevated glucose; Hypothyroidism; Lightheadedness; Symptomatic anemia; Anemia; Anxiety; Degeneration of intervertebral disc of lumbar region; Recurrent major depressive disorder, in partial remission ([place]); Cardiac murmur; H/O neoplasm; HLD (hyperlipidemia); MI (mitral incompetence); OP (osteoporosis); Chronic low back pain (Primary Source of Pain) (Right); Lumbar facet syndrome (Right); Grade 1 Anterolisthesis of L4 over L5 (5 mm); Lumbar spondylosis; Scoliosis of lumbar spine (concave to right side); Lumbar facet arthropathy; Osteoarthritis of hip (Right); Thoracic paracentral T7-8 disc protrusion; Bruising; Diarrhea; Macular degeneration; Chronic pain syndrome; Chronic fatigue; Closed Colles' fracture; Dysuria; History of fall; Chronic sacroiliac joint pain (Right); DDD (degenerative disc disease), lumbar; Long term current use of anticoagulant (Elaquis); and Actinic keratoses on their problem list. Her primarily concern today is the Back Pain (lower)  Pain Assessment: Location: Lower Back Radiating: not radiating Onset: More than a month ago Duration: Chronic pain Quality:  Burning Severity:  /10 (self-reported pain score)  Note: Reported level is compatible with observation.                          Effect on ADL: "I am good now" Timing: Constant Modifying factors: laying down , procedures, rest  Amanda Soto was last seen on 82/09/2016 for a procedure. During today's appointment we reviewed Amanda Soto's post-procedure results, as well as her outpatient medication regimen.  Further details on both, my assessment(s), as well as the proposed treatment plan, please see below.  Controlled Substance Pharmacotherapy Assessment REMS (Risk Evaluation and Mitigation Strategy)  Analgesic: Tramadol 50 mg 1 tablet by mouth every 6 hours prn. (200 mg/day of tramadol) (20 MME/day) MME/day: 20 mg/day   Ignatius Specking, RN  08/05/2017  1:55 PM  Sign at close encounter Nursing Pain Medication Assessment:  Safety precautions to be maintained throughout the outpatient stay will include: orient to surroundings, keep bed in low position, maintain call bell within reach at all times, provide assistance with transfer out of bed and ambulation.  Medication Inspection Compliance: Pill count conducted under aseptic conditions, in front of the patient. Neither the pills nor the bottle was removed from the patient's sight at any time. Once count was completed pills were immediately returned to the patient in their original bottle.      Pharmacokinetics: Liberation and absorption (onset of action): WNL Distribution (time to peak effect): WNL Metabolism and excretion (duration of action): WNL         Pharmacodynamics: Desired effects: Analgesia: [name] reports >50% benefit. Functional ability: Patient reports that medication allows her to accomplish basic ADLs Clinically meaningful improvement in function (CMIF): Sustained CMIF goals met Perceived effectiveness: Described as relatively effective, allowing for increase in activities of daily living (ADL) Undesirable  effects: Side-effects or Adverse reactions: None reported  Monitoring: [place] PMP: Online review of the past 18-monthperiod conducted. Compliant with practice rules and regulations Last UDS on record: No results found for: SUMMARY UDS interpretation: Compliant          Medication Assessment Form: Reviewed. Patient indicates being compliant with therapy Treatment compliance: Compliant Risk Assessment Profile: Aberrant behavior: See prior evaluations. None observed or detected today Comorbid factors increasing risk of overdose: See prior notes. No additional risks detected today Risk of substance use disorder (SUD): Low Opioid Risk Tool - 07/18/17 0901      Family History of Substance Abuse   Alcohol  Negative    Illegal Drugs  Negative    Rx Drugs  Negative      Personal History of Substance Abuse   Alcohol  Negative    Illegal Drugs  Negative    Rx Drugs  Negative      Psychological Disease   Psychological Disease  Negative    Depression  Negative      Total Score   Opioid Risk Tool Scoring  0    Opioid Risk Interpretation  Low Risk      ORT Scoring interpretation table:  Score <3 = Low Risk for SUD  Score between 4-7 = Moderate Risk for SUD  Score >8 = High Risk for Opioid Abuse   Risk Mitigation Strategies:  Patient Counseling: Covered Patient-Prescriber Agreement (PPA): Present and active  Notification to other healthcare providers: Done  Pharmacologic Plan: No change in therapy, at this time.             Post-Procedure Assessment  1/07/08/2016 Procedure: Right lumbar facet nerve block and a right sacroiliac joint injection Pre-procedure pain score:  10/10 Post-procedure pain score: 0/10         Influential Factors: BMI: 22.57 kg/m Intra-procedural challenges: None observed.         Assessment challenges: None detected.              Reported side-effects: None.        Post-procedural adverse reactions or complications: None reported         Sedation: Please see  nurses note. When no sedatives are used, the analgesic levels obtained are directly associated to the effectiveness of the local anesthetics. However, when sedation is provided, the level of analgesia obtained during the initial 1 hour following the intervention, is believed to be the result of a combination of factors. These factors may include, but are not limited to: 1. The effectiveness of the local anesthetics used. 2. The effects of the analgesic(s) and/or anxiolytic(s) used. 3. The degree of discomfort experienced by the patient at the time of the procedure. 4. The patients ability and reliability in recalling and recording the events. 5. The presence and influence of possible secondary gains and/or psychosocial factors. Reported result: Relief experienced during the 1st hour after the procedure: 100 % (Ultra-Short Term Relief)            Interpretative annotation: Clinically appropriate result. Analgesia during this period is likely to be Local Anesthetic and/or IV Sedative (Analgesic/Anxiolytic) related.          Effects of local anesthetic: The analgesic effects attained during this period are directly associated to the localized infiltration of local anesthetics and therefore cary significant diagnostic value as to the etiological location, or anatomical origin, of the pain. Expected duration of relief is directly dependent on the pharmacodynamics of the local anesthetic used. Long-acting (4-6 hours) anesthetics used.  Reported result: Relief  during the next 4 to 6 hour after the procedure: 100 % (Short-Term Relief)            Interpretative annotation: Clinically appropriate result. Analgesia during this period is likely to be Local Anesthetic-related.          Long-term benefit: Defined as the period of time past the expected duration of local anesthetics (1 hour for short-acting and 4-6 hours for long-acting). With the possible exception of prolonged sympathetic blockade from the local  anesthetics, benefits during this period are typically attributed to, or associated with, other factors such as analgesic sensory neuropraxia, antiinflammatory effects, or beneficial biochemical changes provided by agents other than the local anesthetics.  Reported result: Extended relief following procedure: 80 % (Long-Term Relief)            Interpretative annotation: Clinically appropriate result. Good relief. No permanent benefit expected. Inflammation plays a part in the etiology to the pain.          Current benefits: Defined as reported results that persistent at this point in time.   Analgesia: >75 %            Function: Somewhat improved ROM: Somewhat improved Interpretative annotation: Ongoing benefit.    Effective diagnostic intervention.          Interpretation: Results would suggest therapy to have a positive impact on the patient's condition.                  Plan:  Please see "Plan of Care" for details.        Laboratory Chemistry  Inflammation Markers (CRP: Acute Phase) (ESR: Chronic Phase) Lab Results  Component Value Date   ESRSEDRATE 18 07/25/2016                 Rheumatology Markers No results found for: Elayne Guerin, [facility]              Renal Function Markers Lab Results  Component Value Date   BUN 17 06/04/2017   CREATININE 0.51 06/04/2017   GFRAA >60 11/10/2015   GFRNONAA >60 11/10/2015                 Hepatic Function Markers Lab Results  Component Value Date   AST 22 06/04/2017   ALT 15 06/04/2017   ALBUMIN 4.1 06/04/2017   ALKPHOS 44 06/04/2017   LIPASE 181 11/02/2013                 Electrolytes Lab Results  Component Value Date   NA 136 06/04/2017   K 4.2 06/04/2017   CL 100 06/04/2017   CALCIUM 9.1 06/04/2017   MG 2.1 11/10/2015                 Neuropathy Markers Lab Results  Component Value Date   VITAMINB12 807 07/11/2017   FOLATE 18.9 11/10/2015   HGBA1C 5.9 07/12/2017                 Bone  Pathology Markers No results found for: VD25OH, JD552CE0EMV, VK1224SL7, NP0051TM2, 25OHVITD1, 25OHVITD2, 25OHVITD3, TESTOFREE, TESTOSTERONE               Coagulation Parameters Lab Results  Component Value Date   INR 1.3 02/05/2014   LABPROT 15.5 (H) 02/05/2014   APTT < 23.0 (L) 02/05/2014   PLT 228.0 06/04/2017                 Cardiovascular Markers Lab Results  Component  Value Date   CKTOTAL 31 02/05/2014   CKMB 2.2 02/05/2014   TROPONINI <0.03 11/09/2015   HGB 13.6 06/04/2017   HCT 41.8 06/04/2017                 CA Markers No results found for: CEA, CA125, LABCA2               Note: Lab results reviewed.  Recent Diagnostic Imaging Results  DG C-Arm 1-60 Min-No Report Fluoroscopy was utilized by the requesting physician.  No radiographic  interpretation.   Complexity Note: Imaging results reviewed. Results shared with Ms. Uher, using Layman's terms.                         Meds   Current Outpatient Medications:  .  acetaminophen (TYLENOL) 325 MG tablet, Take 650 mg by mouth every 6 (six) hours as needed for moderate pain, fever or headache., Disp: , Rfl:  .  apixaban (ELIQUIS) 2.5 MG TABS tablet, Take 1 tablet (2.5 mg total) by mouth 2 (two) times daily., Disp: 180 tablet, Rfl: 3 .  atorvastatin (LIPITOR) 10 MG tablet, Take 1 tablet (10 mg total) by mouth daily., Disp: 90 tablet, Rfl: 2 .  Cyanocobalamin (VITAMIN B 12 PO), Take 1,000 mg by mouth daily., Disp: , Rfl:  .  furosemide (LASIX) 20 MG tablet, Take 1 tablet (20 mg total) by mouth daily., Disp: 90 tablet, Rfl: 1 .  levothyroxine (SYNTHROID) 50 MCG tablet, Take 1 tablet (50 mcg total) by mouth daily before breakfast. (Patient taking differently: Take 50 mcg by mouth daily before breakfast. Name brand only), Disp: 90 tablet, Rfl: 1 .  Multiple Vitamin (MULTIVITAMIN) capsule, Take 1 capsule by mouth daily. , Disp: , Rfl:  .  Multiple Vitamins-Minerals (OCUVITE EYE HEALTH FORMULA) CAPS, Take 1 capsule by mouth  daily., Disp: , Rfl:  .  sotalol (BETAPACE) 80 MG tablet, Take 40 mg by mouth 2 (two) times daily. , Disp: , Rfl:  .  [START ON 09/03/2017] traMADol (ULTRAM) 50 MG tablet, Take 1 tablet (50 mg total) by mouth every 6 (six) hours as needed., Disp: 120 tablet, Rfl: 5  ROS  Constitutional: Denies any fever or chills Gastrointestinal: No reported hemesis, hematochezia, vomiting, or acute GI distress Musculoskeletal: Denies any acute onset joint swelling, redness, loss of ROM, or weakness Neurological: No reported episodes of acute onset apraxia, aphasia, dysarthria, agnosia, amnesia, paralysis, loss of coordination, or loss of consciousness  Allergies  Ms. Delafuente is allergic to hydrocodone; ciprofloxacin; and latex.  PFSH  Drug: Ms. Burling  reports that she does not use drugs. Alcohol:  reports that she drinks about 0.6 oz of alcohol per week. Tobacco:  reports that  has never smoked. she has never used smokeless tobacco. Medical:  has a past medical history of Acute postoperative pain (12/17/2016), Arthritis, Atrial fibrillation ([place]), CHF (congestive heart failure) ([place]), Chickenpox, Chronic abdominal pain (01/16/2014), Degenerative arthritis of hip (08/19/2014), Degenerative arthritis of lumbar spine (11/18/2013), Depression, Diverticulitis, GERD (gastroesophageal reflux disease), Heart murmur, Heart rate slow, Hyperlipidemia, Hypertension, Hypothyroid, Neuritis or radiculitis due to rupture of lumbar intervertebral disc (11/18/2013), Skin cancer, and Trochanteric bursitis of right hip (11/18/2013). Surgical: Ms. Spradlin  has a past surgical history that includes Cataract extraction; Partial hysterectomy; Appendectomy; and Tonsillectomy. Family: family history includes Breast cancer in her mother and sister; Heart disease in her unknown relative; Hypertension in her unknown relative; Stroke in her maternal grandmother.  Constitutional Exam  General appearance: Well nourished, well developed, and well  hydrated. In no apparent acute distress Vitals:   08/05/17 1343  BP: (!) 136/46  Pulse: (!) 48  Resp: 16  Temp: 97.7 F (36.5 C)  SpO2: 94%  Weight: 108 lb (49 kg)  Height: _0  (1.473 m)   BMI Assessment: Estimated body mass index is 22.57 kg/m as calculated from the following:   Height as of this encounter: _1  (1.473 m).   Weight as of this encounter: 108 lb (49 kg). Psych/Mental status: Alert, oriented x 3 (person, place, & time)       Eyes: PERLA Respiratory: No evidence of acute respiratory distress  Cervical Spine Area Exam  Skin & Axial Inspection: No masses, redness, edema, swelling, or associated skin lesions Alignment: Symmetrical Functional ROM: Unrestricted ROM      Stability: No instability detected Muscle Tone/Strength: Functionally intact. No obvious neuro-muscular anomalies detected. Sensory (Neurological): Unimpaired Palpation: No palpable anomalies              Upper Extremity (UE) Exam    Side: Right upper extremity  Side: Left upper extremity  Skin & Extremity Inspection: Skin color, temperature, and hair growth are WNL. No peripheral edema or cyanosis. No masses, redness, swelling, asymmetry, or associated skin lesions. No contractures.  Skin & Extremity Inspection: Skin color, temperature, and hair growth are WNL. No peripheral edema or cyanosis. No masses, redness, swelling, asymmetry, or associated skin lesions. No contractures.  Functional ROM: Unrestricted ROM          Functional ROM: Unrestricted ROM          Muscle Tone/Strength: Functionally intact. No obvious neuro-muscular anomalies detected.  Muscle Tone/Strength: Functionally intact. No obvious neuro-muscular anomalies detected.  Sensory (Neurological): Unimpaired          Sensory (Neurological): Unimpaired          Palpation: No palpable anomalies              Palpation: No palpable anomalies              Specialized Test(s): Deferred         Specialized Test(s): Deferred          Thoracic  Spine Area Exam  Skin & Axial Inspection: No masses, redness, or swelling Alignment: Symmetrical Functional ROM: Unrestricted ROM Stability: No instability detected Muscle Tone/Strength: Functionally intact. No obvious neuro-muscular anomalies detected. Sensory (Neurological): Unimpaired Muscle strength & Tone: No palpable anomalies  Lumbar Spine Area Exam  Skin & Axial Inspection: No masses, redness, or swelling Alignment: Symmetrical Functional ROM: Unrestricted ROM      Stability: No instability detected Muscle Tone/Strength: Functionally intact. No obvious neuro-muscular anomalies detected. Sensory (Neurological): Unimpaired Palpation: No palpable anomalies       Provocative Tests: Lumbar Hyperextension and rotation test: evaluation deferred today       Lumbar Lateral bending test: evaluation deferred today       Patrick's Maneuver: evaluation deferred today                    Gait & Posture Assessment  Ambulation: Patient ambulates using a wheel chair Gait: Relatively normal for age and body habitus Posture: WNL   Lower Extremity Exam    Side: Right lower extremity  Side: Left lower extremity  Skin & Extremity Inspection: Skin color, temperature, and hair growth are WNL. No peripheral edema or cyanosis. No masses, redness, swelling, asymmetry, or associated skin lesions. No contractures.  Skin &  Extremity Inspection: Skin color, temperature, and hair growth are WNL. No peripheral edema or cyanosis. No masses, redness, swelling, asymmetry, or associated skin lesions. No contractures.  Functional ROM: Unrestricted ROM          Functional ROM: Unrestricted ROM          Muscle Tone/Strength: Functionally intact. No obvious neuro-muscular anomalies detected.  Muscle Tone/Strength: Functionally intact. No obvious neuro-muscular anomalies detected.  Sensory (Neurological): Unimpaired  Sensory (Neurological): Unimpaired  Palpation: No palpable anomalies  Palpation: No palpable anomalies    Assessment  Primary Diagnosis & Pertinent Problem List: The primary encounter diagnosis was Lumbar facet syndrome (Right). Diagnoses of Lumbar spondylosis, Chronic pain syndrome, and Chronic low back pain (Primary Source of Pain) (Right) were also pertinent to this visit.  Status Diagnosis  Controlled Controlled Controlled 1. Lumbar facet syndrome (Right)   2. Lumbar spondylosis   3. Chronic pain syndrome   4. Chronic low back pain (Primary Source of Pain) (Right)     Problems updated and reviewed during this visit: No problems updated. Plan of Care  Pharmacotherapy (Medications Ordered): Meds ordered this encounter  Medications  . traMADol (ULTRAM) 50 MG tablet    Sig: Take 1 tablet (50 mg total) by mouth every 6 (six) hours as needed.    Dispense:  120 tablet    Refill:  5    Fill one day early if pharmacy is closed on scheduled refill date. Do not fill until: 09/03/2017 To last until:01/01/2018    Order Specific Question:   Supervising Provider    Answer:   Milinda Pointer [phone]   New Prescriptions   No medications on file   Medications administered today: [name] had no medications administered during this visit. Lab-work, procedure(s), and/or referral(s): Orders Placed This Encounter  Procedures  . MR LUMBAR SPINE WO CONTRAST   Imaging and/or referral(s): MR LUMBAR SPINE WO CONTRAST  Interventional therapies: Planned, scheduled, and/or pending:  Stop Elaquis for 3 days then Right-sided SI joint injection    Considering:  Right-sided lumbar facet radiofrequency ablation under fluoroscopic guidance and IV sedation.    Palliative PRN treatment(s):  Palliative right-sided lumbar facet block under fluoroscopic guidance and IV sedation    Provider-requested follow-up: Return in about 5 months (around 12/30/2017) for MedMgmt with Me Donella Stade Edison Pace).  Future Appointments  Date Time Provider [place]  08/16/2017  2:45 PM Edrick Kins, [place]  [facility]  12/03/2017  2:45 PM [name], MD [facility]  12/10/2017  1:30 PM Vevelyn Francois, NP [facility] None  07/28/2018  1:00 PM O'Brien-Blaney, Bryson Corona, LPN [facility]   Primary Care Physician: [name], MD Location: [facility] Note by: Vevelyn Francois NP Date: 08/05/2017; Time: 3:49 PM  Pain Score Disclaimer: We use the NRS-11 scale. This is a self-reported, subjective measurement of pain severity with only modest accuracy. It is used primarily to identify changes within a particular patient. It must be understood that outpatient pain scales are significantly less accurate that those used for research, where they can be applied under ideal controlled circumstances with minimal exposure to variables. In reality, the score is likely to be a combination of pain intensity and pain affect, where pain affect describes the degree of emotional arousal or changes in action readiness caused by the sensory experience of pain. Factors such as social and work situation, setting, emotional state, anxiety levels, expectation, and prior pain experience may influence pain perception and  show large inter-individual differences that may also be affected by time variables.  Patient instructions provided during this appointment: Patient Instructions   ____________________________________________________________________________________________  Medication Rules  Applies to: All patients receiving prescriptions (written or electronic).  Pharmacy of record: Pharmacy where electronic prescriptions will be sent. If written prescriptions are taken to a different pharmacy, please inform the nursing staff. The pharmacy listed in the electronic medical record should be the one where you would like electronic prescriptions to be sent.  Prescription refills: Only during scheduled appointments. Applies to both, written and electronic prescriptions.  NOTE:  The following applies primarily to controlled substances (Opioid* Pain Medications).   Patient's responsibilities: 1. Pain Pills: Bring all pain pills to every appointment (except for procedure appointments). 2. Pill Bottles: Bring pills in original pharmacy bottle. Always bring newest bottle. Bring bottle, even if empty. 3. Medication refills: You are responsible for knowing and keeping track of what medications you need refilled. The day before your appointment, write a list of all prescriptions that need to be refilled. Bring that list to your appointment and give it to the admitting nurse. Prescriptions will be written only during appointments. If you forget a medication, it will not be "Called in", "Faxed", or "electronically sent". You will need to get another appointment to get these prescribed. 4. Prescription Accuracy: You are responsible for carefully inspecting your prescriptions before leaving our office. Have the discharge nurse carefully go over each prescription with you, before taking them home. Make sure that your name is accurately spelled, that your address is correct. Check the name and dose of your medication to make sure it is accurate. Check the number of pills, and the written instructions to make sure they are clear and accurate. Make sure that you are given enough medication to last until your next medication refill appointment. 5. Taking Medication: Take medication as prescribed. Never take more pills than instructed. Never take medication more frequently than prescribed. Taking less pills or less frequently is permitted and encouraged, when it comes to controlled substances (written prescriptions).  6. Inform other Doctors: Always inform, all of your healthcare providers, of all the medications you take. 7. Pain Medication from other Providers: You are not allowed to accept any additional pain medication from any other Doctor or Healthcare provider. There are two exceptions to  this rule. (see below) In the event that you require additional pain medication, you are responsible for notifying us, as stated below. 8. Medication Agreement: You are responsible for carefully reading and following our Medication Agreement. This must be signed before receiving any prescriptions from our practice. Safely store a copy of your signed Agreement. Violations to the Agreement will result in no further prescriptions. (Additional copies of our Medication Agreement are available upon request.) 9. Laws, Rules, & Regulations: All patients are expected to follow all Federal and [employer], Rules, [employer]. Ignorance of the Laws does not constitute a valid excuse. The use of any illegal substances is prohibited. 10. Adopted CDC guidelines & recommendations: Target dosing levels will be at or below 60 MME/day. Use of benzodiazepines** is not recommended.  Exceptions: There are only two exceptions to the rule of not receiving pain medications from other Healthcare Providers. 1. Exception #1 (Emergencies): In the event of an emergency (i.e.: accident requiring emergency care), you are allowed to receive additional pain medication. However, you are responsible for: As soon as you are able, call our office [phone], at any time of the day or  night, and leave a message stating your name, the date and nature of the emergency, and the name and dose of the medication prescribed. In the event that your call is answered by a member of our staff, make sure to document and save the date, time, and the name of the person that took your information.  2. Exception #2 (Planned Surgery): In the event that you are scheduled by another doctor or dentist to have any type of surgery or procedure, you are allowed (for a period no longer than 30 days), to receive additional pain medication, for the acute post-op pain. However, in this case, you are responsible for picking up a copy of our "Post-op Pain  Management for Surgeons" handout, and giving it to your surgeon or dentist. This document is available at our office, and does not require an appointment to obtain it. Simply go to our office during business hours (Monday-Thursday from 8:00 AM to 4:00 PM) (Friday 8:00 AM to 12:00 Noon) or if you have a scheduled appointment with [place], prior to your surgery, and ask for it by name. In addition, you will need to provide [place] with your name, name of your surgeon, type of surgery, and date of procedure or surgery.  *Opioid medications include: morphine, codeine, oxycodone, oxymorphone, hydrocodone, hydromorphone, meperidine, tramadol, tapentadol, buprenorphine, fentanyl, methadone. **Benzodiazepine medications include: diazepam (Valium), alprazolam (Xanax), clonazepam (Klonopine), lorazepam (Ativan), clorazepate (Tranxene), chlordiazepoxide (Librium), estazolam (Prosom), oxazepam (Serax), temazepam (Restoril), triazolam (Halcion)  You were given one prescription for Tramadol today. ____________________________________________________________________________________________

## 2017-08-05 NOTE — Patient Instructions (Addendum)
____________________________________________________________________________________________  Medication Rules  Applies to: All patients receiving prescriptions (written or electronic).  Pharmacy of record: Pharmacy where electronic prescriptions will be sent. If written prescriptions are taken to a different pharmacy, please inform the nursing staff. The pharmacy listed in the electronic medical record should be the one where you would like electronic prescriptions to be sent.  Prescription refills: Only during scheduled appointments. Applies to both, written and electronic prescriptions.  NOTE: The following applies primarily to controlled substances (Opioid* Pain Medications).   Patient's responsibilities: 1. Pain Pills: Bring all pain pills to every appointment (except for procedure appointments). 2. Pill Bottles: Bring pills in original pharmacy bottle. Always bring newest bottle. Bring bottle, even if empty. 3. Medication refills: You are responsible for knowing and keeping track of what medications you need refilled. The day before your appointment, write a list of all prescriptions that need to be refilled. Bring that list to your appointment and give it to the admitting nurse. Prescriptions will be written only during appointments. If you forget a medication, it will not be "Called in", "Faxed", or "electronically sent". You will need to get another appointment to get these prescribed. 4. Prescription Accuracy: You are responsible for carefully inspecting your prescriptions before leaving our office. Have the discharge nurse carefully go over each prescription with you, before taking them home. Make sure that your name is accurately spelled, that your address is correct. Check the name and dose of your medication to make sure it is accurate. Check the number of pills, and the written instructions to make sure they are clear and accurate. Make sure that you are given enough medication to  last until your next medication refill appointment. 5. Taking Medication: Take medication as prescribed. Never take more pills than instructed. Never take medication more frequently than prescribed. Taking less pills or less frequently is permitted and encouraged, when it comes to controlled substances (written prescriptions).  6. Inform other Doctors: Always inform, all of your healthcare providers, of all the medications you take. 7. Pain Medication from other Providers: You are not allowed to accept any additional pain medication from any other Doctor or Healthcare provider. There are two exceptions to this rule. (see below) In the event that you require additional pain medication, you are responsible for notifying us, as stated below. 8. Medication Agreement: You are responsible for carefully reading and following our Medication Agreement. This must be signed before receiving any prescriptions from our practice. Safely store a copy of your signed Agreement. Violations to the Agreement will result in no further prescriptions. (Additional copies of our Medication Agreement are available upon request.) 9. Laws, Rules, & Regulations: All patients are expected to follow all Federal and State Laws, Statutes, Rules, & Regulations. Ignorance of the Laws does not constitute a valid excuse. The use of any illegal substances is prohibited. 10. Adopted CDC guidelines & recommendations: Target dosing levels will be at or below 60 MME/day. Use of benzodiazepines** is not recommended.  Exceptions: There are only two exceptions to the rule of not receiving pain medications from other Healthcare Providers. 1. Exception #1 (Emergencies): In the event of an emergency (i.e.: accident requiring emergency care), you are allowed to receive additional pain medication. However, you are responsible for: As soon as you are able, call our office [phone], at any time of the day or night, and leave a message stating your  name, the date and nature of the emergency, and the name and dose of the medication   prescribed. In the event that your call is answered by a member of our staff, make sure to document and save the date, time, and the name of the person that took your information.  2. Exception #2 (Planned Surgery): In the event that you are scheduled by another doctor or dentist to have any type of surgery or procedure, you are allowed (for a period no longer than 30 days), to receive additional pain medication, for the acute post-op pain. However, in this case, you are responsible for picking up a copy of our "Post-op Pain Management for Surgeons" handout, and giving it to your surgeon or dentist. This document is available at our office, and does not require an appointment to obtain it. Simply go to our office during business hours (Monday-Thursday from 8:00 AM to 4:00 PM) (Friday 8:00 AM to 12:00 Noon) or if you have a scheduled appointment with [place], prior to your surgery, and ask for it by name. In addition, you will need to provide [place] with your name, name of your surgeon, type of surgery, and date of procedure or surgery.  *Opioid medications include: morphine, codeine, oxycodone, oxymorphone, hydrocodone, hydromorphone, meperidine, tramadol, tapentadol, buprenorphine, fentanyl, methadone. **Benzodiazepine medications include: diazepam (Valium), alprazolam (Xanax), clonazepam (Klonopine), lorazepam (Ativan), clorazepate (Tranxene), chlordiazepoxide (Librium), estazolam (Prosom), oxazepam (Serax), temazepam (Restoril), triazolam (Halcion)  You were given one prescription for Tramadol today. ____________________________________________________________________________________________

## 2017-08-13 ENCOUNTER — Telehealth: Payer: Self-pay | Admitting: Family Medicine

## 2017-08-13 NOTE — Telephone Encounter (Signed)
Copied from [place]. Topic: Medical Record Request - Provider/Facility Request >> Aug 13, 2017 11:23 AM Cecelia Byars, NT wrote: Patient Name/DOB/MRN #: [birth year]  [id number] Requestor Name/Agency: [facility]  Call Back #:  [phone]  fax [phone] Information Requested: most recent echo , most recent ejection fraction    Route to [place] for [place] clinics. For all other clinics, route to the clinic's PEC Pool.

## 2017-08-15 ENCOUNTER — Ambulatory Visit
Admission: RE | Admit: 2017-08-15 | Discharge: 2017-08-15 | Disposition: A | Discharge status: Home / Self Care | Payer: Medicare Other | Source: Ambulatory Visit | Attending: Nurse Practitioner | Admitting: Nurse Practitioner

## 2017-08-15 DIAGNOSIS — M47896 Other spondylosis, lumbar region: Secondary | ICD-10-CM | POA: Insufficient documentation

## 2017-08-15 DIAGNOSIS — M47816 Spondylosis without myelopathy or radiculopathy, lumbar region: Secondary | ICD-10-CM | POA: Diagnosis not present

## 2017-08-15 DIAGNOSIS — M5136 Other intervertebral disc degeneration, lumbar region: Secondary | ICD-10-CM | POA: Diagnosis not present

## 2017-08-15 DIAGNOSIS — G128 Other spinal muscular atrophies and related syndromes: Secondary | ICD-10-CM | POA: Diagnosis not present

## 2017-08-15 DIAGNOSIS — M545 Low back pain: Secondary | ICD-10-CM | POA: Insufficient documentation

## 2017-08-15 NOTE — Progress Notes (Signed)
Results were reviewed and found to be: significantly abnormal  No acute injury or pathology identified  Review would suggest interventional pain management techniques may be of benefit

## 2017-08-16 ENCOUNTER — Ambulatory Visit: Payer: Medicare Other | Admitting: Podiatry

## 2017-08-16 ENCOUNTER — Encounter: Payer: Self-pay | Admitting: Podiatry

## 2017-08-16 DIAGNOSIS — B351 Tinea unguium: Secondary | ICD-10-CM

## 2017-08-16 DIAGNOSIS — D689 Coagulation defect, unspecified: Secondary | ICD-10-CM

## 2017-08-16 DIAGNOSIS — M79609 Pain in unspecified limb: Secondary | ICD-10-CM | POA: Diagnosis not present

## 2017-08-19 ENCOUNTER — Ambulatory Visit: Payer: Medicare Other | Admitting: Pain Medicine

## 2017-08-19 NOTE — Progress Notes (Signed)
   SUBJECTIVE Patient presents to office today complaining of elongated, thickened nails. Pain while ambulating in shoes. Patient is unable to trim their own nails.   Past Medical History:  Diagnosis Date  . Acute postoperative pain 12/17/2016  . Arthritis   . Atrial fibrillation ([place])   . CHF (congestive heart failure) ([place])   . Chickenpox   . Chronic abdominal pain 01/16/2014  . Degenerative arthritis of hip 08/19/2014  . Degenerative arthritis of lumbar spine 11/18/2013  . Depression   . Diverticulitis   . GERD (gastroesophageal reflux disease)   . Heart murmur   . Heart rate slow   . Hyperlipidemia   . Hypertension   . Hypothyroid   . Neuritis or radiculitis due to rupture of lumbar intervertebral disc 11/18/2013  . Skin cancer   . Trochanteric bursitis of right hip 11/18/2013    OBJECTIVE General Patient is awake, alert, and oriented x 3 and in no acute distress. Derm Skin is dry and supple bilateral. Negative open lesions or macerations. Remaining integument unremarkable. Nails are tender, long, thickened and dystrophic with subungual debris, consistent with onychomycosis, 1-5 bilateral. No signs of infection noted. Vasc  DP and PT pedal pulses palpable bilaterally. Temperature gradient within normal limits.  Neuro Epicritic and protective threshold sensation diminished bilaterally.  Musculoskeletal Exam No symptomatic pedal deformities noted bilateral. Muscular strength within normal limits.  ASSESSMENT 1. Onychodystrophic nails 1-5 bilateral with hyperkeratosis of nails.  2. Onychomycosis of nail due to dermatophyte bilateral 3. Pain in foot bilateral  PLAN OF CARE 1. Patient evaluated today.  2. Instructed to maintain good pedal hygiene and foot care.  3. Mechanical debridement of nails 1-5 bilaterally performed using a nail nipper. Filed with dremel without incident.  4. Return to clinic in 3 mos.    Edrick Kins, DPM [facility]  Dr. Edrick Kins, [address]                                        [place], [place] [address]                Office [phone]  Fax [phone]

## 2017-08-20 ENCOUNTER — Telehealth: Payer: Self-pay | Admitting: Pain Medicine

## 2017-08-20 NOTE — Telephone Encounter (Signed)
Results read to patient. 

## 2017-08-20 NOTE — Telephone Encounter (Signed)
Patient would like to know results of MRI.

## 2017-08-23 ENCOUNTER — Ambulatory Visit: Payer: Medicare Other | Admitting: Family Medicine

## 2017-09-14 ENCOUNTER — Other Ambulatory Visit: Payer: Self-pay | Admitting: Family Medicine

## 2017-09-15 ENCOUNTER — Other Ambulatory Visit: Payer: Self-pay | Admitting: Family Medicine

## 2017-09-25 ENCOUNTER — Telehealth: Payer: Self-pay

## 2017-09-25 NOTE — Telephone Encounter (Signed)
Copied from [place]. Topic: General - Other >> Sep 24, 2017  3:08 PM Yvette Rack wrote: Reason for CRM: pt Grand daughter Melanee Spry [phone] calling stating that her Grandmother think that she is suppose to take Synthroid 50 mg instead of Synthroid 41mcg and would like to clarify that please call pt at the home number

## 2017-09-25 NOTE — Telephone Encounter (Signed)
Informed patient that this medication is measured in mcg.Patient verbalized understanding.

## 2017-10-28 NOTE — Telephone Encounter (Signed)
Request will be given to [facility] for process 10/28/17

## 2017-12-03 ENCOUNTER — Ambulatory Visit: Payer: Medicare Other | Admitting: Family Medicine

## 2017-12-06 ENCOUNTER — Ambulatory Visit (INDEPENDENT_AMBULATORY_CARE_PROVIDER_SITE_OTHER): Payer: Medicare Other | Admitting: Family Medicine

## 2017-12-06 ENCOUNTER — Encounter: Payer: Self-pay | Admitting: Family Medicine

## 2017-12-06 VITALS — BP 140/66 | HR 56 | Temp 98.3°F | Ht <= 58 in | Wt 118.4 lb

## 2017-12-06 DIAGNOSIS — T148XXA Other injury of unspecified body region, initial encounter: Secondary | ICD-10-CM | POA: Diagnosis not present

## 2017-12-06 DIAGNOSIS — E039 Hypothyroidism, unspecified: Secondary | ICD-10-CM | POA: Diagnosis not present

## 2017-12-06 DIAGNOSIS — I10 Essential (primary) hypertension: Secondary | ICD-10-CM | POA: Diagnosis not present

## 2017-12-06 DIAGNOSIS — R300 Dysuria: Secondary | ICD-10-CM | POA: Diagnosis not present

## 2017-12-06 DIAGNOSIS — E785 Hyperlipidemia, unspecified: Secondary | ICD-10-CM | POA: Diagnosis not present

## 2017-12-06 LAB — POCT URINALYSIS DIPSTICK
Bilirubin, UA: NEGATIVE
Blood, UA: NEGATIVE
Glucose, UA: NEGATIVE
Ketones, UA: NEGATIVE
Leukocytes, UA: NEGATIVE
NITRITE UA: NEGATIVE
Protein, UA: NEGATIVE
SPEC GRAV UA: 1.015 (ref 1.010–1.025)
Urobilinogen, UA: 0.2 E.U./dL
pH, UA: 5.5 (ref 5.0–8.0)

## 2017-12-06 NOTE — Assessment & Plan Note (Signed)
Skin changes may be related to this.  We will check a TSH.  If her TSH is acceptable we will consider referral to dermatology.

## 2017-12-06 NOTE — Assessment & Plan Note (Signed)
Currently off of Lipitor.  She will follow-up with her cardiologist regarding this.

## 2017-12-06 NOTE — Assessment & Plan Note (Signed)
Patient with likely muscle strain in her left arm.  Discussed rest and ice.  If not improving she will let us know.

## 2017-12-06 NOTE — Assessment & Plan Note (Signed)
Well-controlled.  Check BMP. 

## 2017-12-06 NOTE — Progress Notes (Signed)
  [name], MD Phone: [phone]  Amanda Soto is a 84 y.o. female who presents today for f/u.  CC: hypothyroidism, dysuria  HYPOTHYROIDISM Disease Monitoring Weight changes: no  Skin Changes: yes, dry scaly skin in patches Palpitations: no Heat/Cold intolerance: no  Medication Monitoring Compliance:  Taking synthroid   Last TSH:   Lab Results  Component Value Date   TSH 0.99 06/04/2017   HYPERLIPIDEMIA Symptoms Chest pain on exertion: no    Medications: As her cardiologist took her off of her Lipitor.  No shortness of breath.  Patient reports for some time now she has had an odor to her urine.  Slight back discomfort.  Some dysuria and frequency and urgency.  No blood in her stool.  No abdominal pain.  No vaginal discharge.  She reports the musculature of her left upper arm in the lateral portion has been bothering her some today.  Notes it sore to touch.  She has been using her arms more frequently recently.  She is using a walker and cane.    Social History   Tobacco Use  Smoking Status Never Smoker  Smokeless Tobacco Never Used     ROS see history of present illness  Objective  Physical Exam Vitals:   12/06/17 1457  BP: 140/66  Pulse: (!) 56  Temp: 98.3 F (36.8 C)  SpO2: 96%    BP Readings from Last 3 Encounters:  12/06/17 140/66  08/05/17 (!) 136/46  07/26/17 112/62   Wt Readings from Last 3 Encounters:  12/06/17 118 lb 6.4 oz (53.7 kg)  08/05/17 108 lb (49 kg)  07/26/17 112 lb 12.8 oz (51.2 kg)    Physical Exam  Constitutional: No distress.  Cardiovascular: Normal rate, regular rhythm and normal heart sounds.  Pulmonary/Chest: Effort normal and breath sounds normal.  Abdominal: Soft. Bowel sounds are normal. She exhibits no distension. There is no tenderness. There is no rebound and no guarding.  Musculoskeletal: She exhibits no edema.  Slight tenderness of the musculature of her left upper extremity upper portion laterally, no bony  defects, no tenderness of the lower arm on the left, 5/5 grip strength, biceps, and triceps strength, sensation light touch intact bilateral upper extremities  Neurological: She is alert.  Skin: Skin is warm and dry. She is not diaphoretic.  Dry patches of skin   Assessment/Plan: Please see individual problem list.  HTN (hypertension) Well-controlled.  Check BMP.  Hypothyroidism Skin changes may be related to this.  We will check a TSH.  If her TSH is acceptable we will consider referral to dermatology.  Dysuria UA negative.  Will send for culture.  Muscle strain Patient with likely muscle strain in her left arm.  Discussed rest and ice.  If not improving she will let us know.  HLD (hyperlipidemia) Currently off of Lipitor.  She will follow-up with her cardiologist regarding this.   Orders Placed This Encounter  Procedures  . Urine Culture  . TSH  . Basic Metabolic Panel (BMET)  . POCT Urinalysis Dipstick    No orders of the defined types were placed in this encounter.    [name], MD [place]

## 2017-12-06 NOTE — Assessment & Plan Note (Addendum)
UA negative.  Will send for culture.

## 2017-12-06 NOTE — Patient Instructions (Addendum)
Nice to see you. Please rest your left arm and you can ice it. We will contact you with your lab results. We will send her urine for culture and contact you once it returns.

## 2017-12-07 LAB — BASIC METABOLIC PANEL
BUN / CREAT RATIO: 55 (calc) — AB (ref 6–22)
BUN: 27 mg/dL — ABNORMAL HIGH (ref 7–25)
CO2: 24 mmol/L (ref 20–32)
CREATININE: 0.49 mg/dL — AB (ref 0.60–0.88)
Calcium: 9.4 mg/dL (ref 8.6–10.4)
Chloride: 99 mmol/L (ref 98–110)
Glucose, Bld: 76 mg/dL (ref 65–99)
POTASSIUM: 4.7 mmol/L (ref 3.5–5.3)
Sodium: 136 mmol/L (ref 135–146)

## 2017-12-07 LAB — URINE CULTURE
MICRO NUMBER: 90687021
SPECIMEN QUALITY: ADEQUATE

## 2017-12-07 LAB — TSH: TSH: 1.61 m[IU]/L (ref 0.40–4.50)

## 2017-12-10 ENCOUNTER — Ambulatory Visit: Payer: Medicare Other | Attending: Nurse Practitioner | Admitting: Nurse Practitioner

## 2017-12-10 ENCOUNTER — Encounter: Payer: Self-pay | Admitting: Nurse Practitioner

## 2017-12-10 ENCOUNTER — Other Ambulatory Visit: Payer: Self-pay

## 2017-12-10 VITALS — BP 144/51 | HR 48 | Temp 97.9°F | Resp 16 | Ht 59.5 in | Wt 118.0 lb

## 2017-12-10 DIAGNOSIS — Z885 Allergy status to narcotic agent status: Secondary | ICD-10-CM | POA: Diagnosis not present

## 2017-12-10 DIAGNOSIS — Z91040 Latex allergy status: Secondary | ICD-10-CM | POA: Diagnosis not present

## 2017-12-10 DIAGNOSIS — Z90711 Acquired absence of uterus with remaining cervical stump: Secondary | ICD-10-CM | POA: Insufficient documentation

## 2017-12-10 DIAGNOSIS — Z9889 Other specified postprocedural states: Secondary | ICD-10-CM | POA: Diagnosis not present

## 2017-12-10 DIAGNOSIS — G8929 Other chronic pain: Secondary | ICD-10-CM

## 2017-12-10 DIAGNOSIS — M545 Low back pain, unspecified: Secondary | ICD-10-CM

## 2017-12-10 DIAGNOSIS — M47816 Spondylosis without myelopathy or radiculopathy, lumbar region: Secondary | ICD-10-CM | POA: Insufficient documentation

## 2017-12-10 DIAGNOSIS — Z79899 Other long term (current) drug therapy: Secondary | ICD-10-CM | POA: Diagnosis not present

## 2017-12-10 DIAGNOSIS — Z881 Allergy status to other antibiotic agents status: Secondary | ICD-10-CM | POA: Insufficient documentation

## 2017-12-10 DIAGNOSIS — Z79890 Hormone replacement therapy (postmenopausal): Secondary | ICD-10-CM | POA: Diagnosis not present

## 2017-12-10 DIAGNOSIS — Z7901 Long term (current) use of anticoagulants: Secondary | ICD-10-CM | POA: Insufficient documentation

## 2017-12-10 DIAGNOSIS — Z9849 Cataract extraction status, unspecified eye: Secondary | ICD-10-CM | POA: Diagnosis not present

## 2017-12-10 DIAGNOSIS — G894 Chronic pain syndrome: Secondary | ICD-10-CM | POA: Diagnosis not present

## 2017-12-10 DIAGNOSIS — M5136 Other intervertebral disc degeneration, lumbar region: Secondary | ICD-10-CM

## 2017-12-10 MED ORDER — TRAMADOL HCL 50 MG PO TABS: 50.0000 mg | ORAL_TABLET | Freq: Four times a day (QID) | ORAL | 5 refills | Status: DC | PRN

## 2017-12-10 NOTE — Progress Notes (Signed)
Patient's Name: Amanda Soto  MRN: [id number]  Referring Provider: [name], MD  DOB: [birth year]  PCP: [name], MD  DOS: 12/10/2017  Note by: Vevelyn Francois NP  Service setting: Ambulatory outpatient  Specialty: Interventional Pain Management  Location: [facility] (AMB) Pain Management Facility    Patient type: Established    Primary Reason(s) for Visit: Encounter for prescription drug management. (Level of risk: moderate)  CC: Back Pain (lower)  HPI  Amanda Soto is a 84 y.o. year old, female patient, who comes today for a medication management evaluation. She has Chronic systolic heart failure ([place]); Bradycardia; Atrial fibrillation ([place]); HTN (hypertension); Abdominal discomfort; Exertional shortness of breath; Elevated glucose; Hypothyroidism; Lightheadedness; Symptomatic anemia; Anemia; Anxiety; Degeneration of intervertebral disc of lumbar region; Recurrent major depressive disorder, in partial remission ([place]); Cardiac murmur; H/O neoplasm; HLD (hyperlipidemia); MI (mitral incompetence); OP (osteoporosis); Chronic low back pain (Primary Source of Pain) (Right); Lumbar facet syndrome (Right); Grade 1 Anterolisthesis of L4 over L5 (5 mm); Lumbar spondylosis; Scoliosis of lumbar spine (concave to right side); Lumbar facet arthropathy; Osteoarthritis of hip (Right); Thoracic paracentral T7-8 disc protrusion; Macular degeneration; Chronic pain syndrome; Chronic fatigue; Closed Colles' fracture; Dysuria; History of fall; Chronic sacroiliac joint pain (Right); DDD (degenerative disc disease), lumbar; Long term current use of anticoagulant (Elaquis); Actinic keratoses; and Muscle strain on their problem list. Her primarily concern today is the Back Pain (lower)  Pain Assessment: Location: Lower Back Radiating: right hip Onset: More than a month ago Duration:   Quality: Aching, Constant, Discomfort Severity: 1 /10 (subjective, self-reported pain score)  Note: Reported level is compatible  with observation.                          Effect on ADL: Adl's Timing: Constant Modifying factors: rest, medication BP: (!) 144/51  HR: (!) 48  Amanda Soto was last scheduled for an appointment on 08/05/2017 for medication management. During today's appointment we reviewed Amanda Soto's chronic pain status, as well as her outpatient medication regimen. She admits that she started today using 2 Tramadol with APAP and she feels like this maybe effective for the pain that she was having going down her leg and occasionally in to her groin area. She denies any numbness or tingling just pain.   The patient  reports that she does not use drugs. Her body mass index is 23.43 kg/m.  Further details on both, my assessment(s), as well as the proposed treatment plan, please see below.  Controlled Substance Pharmacotherapy Assessment REMS (Risk Evaluation and Mitigation Strategy)  Analgesic:Tramadol 50 mg 1 tablet by mouth every 6 hours prn. (219m/dayof tramadol) (20MME/day) MME/day:229mday   GaIgnatius SpeckingRN  12/10/2017  2:28 PM  Sign at close encounter Nursing Pain Medication Assessment:  Safety precautions to be maintained throughout the outpatient stay will include: orient to surroundings, keep bed in low position, maintain call bell within reach at all times, provide assistance with transfer out of bed and ambulation.  Medication Inspection Compliance: Amanda Soto not comply with our request to bring her pills to be counted. She was reminded that bringing the medication bottles, even when empty, is a requirement.  Medication: None brought in. Pill/Patch Count: None available to be counted. Bottle Appearance: No container available. Did not bring bottle(s) to appointment. Filled Date: N/A Last Medication intake:  Today   Pharmacokinetics: Liberation and absorption (onset of action): WNL Distribution (time to peak effect): WNL  Metabolism and excretion (duration of action): WNL          Pharmacodynamics: Desired effects: Analgesia: Amanda Soto reports >50% benefit. Functional ability: Patient reports that medication allows her to accomplish basic ADLs Clinically meaningful improvement in function (CMIF): Sustained CMIF goals met Perceived effectiveness: Described as relatively effective, allowing for increase in activities of daily living (ADL) Undesirable effects: Side-effects or Adverse reactions: None reported Monitoring: [place] PMP: Online review of the past 12-month period conducted. Compliant with practice rules and regulations Last UDS on record: No results found for: SUMMARY UDS interpretation: Compliant          Medication Assessment Form: Reviewed. Patient indicates being compliant with therapy Treatment compliance: Compliant Risk Assessment Profile: Aberrant behavior: See prior evaluations. None observed or detected today Comorbid factors increasing risk of overdose: See prior notes. No additional risks detected today Risk of substance use disorder (SUD): Low  ORT Scoring interpretation table:  Score <3 = Low Risk for SUD  Score between 4-7 = Moderate Risk for SUD  Score >8 = High Risk for Opioid Abuse   Risk Mitigation Strategies:  Patient Counseling: Covered Patient-Prescriber Agreement (PPA): Present and active  Notification to other healthcare providers: Done  Pharmacologic Plan: No change in therapy, at this time.             Laboratory Chemistry  Inflammation Markers (CRP: Acute Phase) (ESR: Chronic Phase) Lab Results  Component Value Date   ESRSEDRATE 18 07/25/2016                         Rheumatology Markers No results found for: RF, ANA, LABURIC, URICUR, LYMEIGGIGMAB, LYMEABIGMQN, HLAB27                      Renal Function Markers Lab Results  Component Value Date   BUN 27 (H) 12/06/2017   CREATININE 0.49 (L) 12/06/2017   BCR 55 (H) 12/06/2017   GFRAA >60 11/10/2015   GFRNONAA >60 11/10/2015                             Hepatic  Function Markers Lab Results  Component Value Date   AST 22 06/04/2017   ALT 15 06/04/2017   ALBUMIN 4.1 06/04/2017   ALKPHOS 44 06/04/2017   LIPASE 181 11/02/2013                        Electrolytes Lab Results  Component Value Date   NA 136 12/06/2017   K 4.7 12/06/2017   CL 99 12/06/2017   CALCIUM 9.4 12/06/2017   MG 2.1 11/10/2015                        Neuropathy Markers Lab Results  Component Value Date   VITAMINB12 807 07/11/2017   FOLATE 18.9 11/10/2015   HGBA1C 5.9 07/12/2017                        Bone Pathology Markers No results found for: VD25OH, VD125OH2TOT, VD3125OH2, VD2125OH2, 25OHVITD1, 25OHVITD2, 25OHVITD3, TESTOFREE, TESTOSTERONE                       Coagulation Parameters Lab Results  Component Value Date   INR 1.3 02/05/2014   LABPROT 15.5 (H) 02/05/2014   APTT < 23.0 (L) 02/05/2014     PLT 228.0 06/04/2017                        Cardiovascular Markers Lab Results  Component Value Date   CKTOTAL 31 02/05/2014   CKMB 2.2 02/05/2014   TROPONINI <0.03 11/09/2015   HGB 13.6 06/04/2017   HCT 41.8 06/04/2017                         CA Markers No results found for: CEA, CA125, LABCA2                      Note: Lab results reviewed.  Recent Diagnostic Imaging Results  MR LUMBAR SPINE WO CONTRAST CLINICAL DATA:  Chronic low back pain  EXAM: MRI LUMBAR SPINE WITHOUT CONTRAST  TECHNIQUE: Multiplanar, multisequence MR imaging of the lumbar spine was performed. No intravenous contrast was administered.  COMPARISON:  Lumbar spine MRI 09/03/2007  FINDINGS: Segmentation:  Normal  Alignment:  Grade 1 L5-S1 anterolisthesis, unchanged  Vertebrae:  No fracture, evidence of discitis, or bone lesion.  Conus medullaris and cauda equina: Conus extends to the L1 level. Conus and cauda equina appear normal.  Paraspinal and other soft tissues: Severe bilateral atrophy of the paraspinous musculature. Left renal cyst measures 13 mm.  Disc  levels:  The disc spaces from T10-L3 are normal.  L3-L4: Small amount of fluid in the right facet joint. Minimal disc bulge. No stenosis.  L4-L5: Moderate facet hypertrophy with small disc bulge. No stenosis.  L5-S1: Normal disc space and facets.  No stenosis.  IMPRESSION: 1. Mild lumbar degenerative disc disease without disc herniation, spinal canal stenosis or neural foraminal stenosis. 2. Severe atrophy of the paraspinous musculature. 3. Moderate facet arthrosis at L4-L5, unchanged. This may serve as a source of local low back pain.  Electronically Signed   By: Kevin  Herman M.D.   On: 08/15/2017 14:40  Complexity Note: Imaging results reviewed. Results shared with Amanda Soto, using Layman's terms.                         Meds   Current Outpatient Medications:  .  acetaminophen (TYLENOL) 325 MG tablet, Take 650 mg by mouth every 6 (six) hours as needed for moderate pain, fever or headache., Disp: , Rfl:  .  apixaban (ELIQUIS) 2.5 MG TABS tablet, Take 1 tablet (2.5 mg total) by mouth 2 (two) times daily., Disp: 180 tablet, Rfl: 3 .  Cyanocobalamin (VITAMIN B 12 PO), Take 1,000 mg by mouth daily., Disp: , Rfl:  .  furosemide (LASIX) 20 MG tablet, Take 1 tablet (20 mg total) by mouth daily., Disp: 90 tablet, Rfl: 1 .  Multiple Vitamin (MULTIVITAMIN) capsule, Take 1 capsule by mouth daily. , Disp: , Rfl:  .  Multiple Vitamins-Minerals (OCUVITE EYE HEALTH FORMULA) CAPS, Take 1 capsule by mouth daily., Disp: , Rfl:  .  sotalol (BETAPACE) 80 MG tablet, Take 40 mg by mouth 2 (two) times daily. , Disp: , Rfl:  .  SYNTHROID 50 MCG tablet, TAKE 1 TABLET (50 MCG TOTAL) BY MOUTH DAILY BEFORE BREAKFAST., Disp: 90 tablet, Rfl: 1 .  traMADol (ULTRAM) 50 MG tablet, Take 1 tablet (50 mg total) by mouth every 6 (six) hours as needed., Disp: 120 tablet, Rfl: 5  ROS  Constitutional: Denies any fever or chills Gastrointestinal: No reported hemesis, hematochezia, vomiting, or acute GI  distress Musculoskeletal: Denies any acute onset   joint swelling, redness, loss of ROM, or weakness Neurological: No reported episodes of acute onset apraxia, aphasia, dysarthria, agnosia, amnesia, paralysis, loss of coordination, or loss of consciousness   Allergies  Amanda Soto is allergic to hydrocodone; ciprofloxacin; and latex.  PFSH  Drug: Amanda Soto  reports that she does not use drugs. Alcohol:  reports that she drinks about 0.6 oz of alcohol per week. Tobacco:  reports that she has never smoked. She has never used smokeless tobacco. Medical:  has a past medical history of Acute postoperative pain (12/17/2016), Arthritis, Atrial fibrillation (HCC), CHF (congestive heart failure) (HCC), Chickenpox, Chronic abdominal pain (01/16/2014), Degenerative arthritis of hip (08/19/2014), Degenerative arthritis of lumbar spine (11/18/2013), Depression, Diverticulitis, GERD (gastroesophageal reflux disease), Heart murmur, Heart rate slow, Hyperlipidemia, Hypertension, Hypothyroid, Neuritis or radiculitis due to rupture of lumbar intervertebral disc (11/18/2013), Skin cancer, and Trochanteric bursitis of right hip (11/18/2013). Surgical: Amanda Soto  has a past surgical history that includes Cataract extraction; Partial hysterectomy; Appendectomy; and Tonsillectomy. Family: family history includes Breast cancer in her mother and sister; Heart disease in her unknown relative; Hypertension in her unknown relative; Stroke in her maternal grandmother.  Constitutional Exam  General appearance: Well nourished, well developed, and well hydrated. In no apparent acute distress Vitals:   12/10/17 1416  BP: (!) 144/51  Pulse: (!) 48  Resp: 16  Temp: 97.9 F (36.6 C)  SpO2: 97%  Weight: 118 lb (53.5 kg)  Height: 4' 11.5" (1.511 m)  Psych/Mental status: Alert, oriented x 3 (person, place, & time)       Eyes: PERLA Respiratory: No evidence of acute respiratory distress  Lumbar Spine Area Exam  Skin & Axial  Inspection: No masses, redness, or swelling Alignment: Symmetrical Functional ROM: Unrestricted ROM       Stability: No instability detected Muscle Tone/Strength: Functionally intact. No obvious neuro-muscular anomalies detected. Sensory (Neurological): Unimpaired Palpation: Tender       Provocative Tests: Lumbar Hyperextension/rotation test: deferred today       Lumbar quadrant test (Kemp's test): deferred today       Lumbar Lateral bending test: deferred today       Patrick's Maneuver: deferred today                   FABER test: deferred today       Thigh-thrust test: deferred today       S-I compression test: deferred today       S-I distraction test: deferred today        Gait & Posture Assessment  Ambulation: Patient ambulates using a walker Gait: Relatively normal for age and body habitus Posture: WNL   Lower Extremity Exam    Side: Right lower extremity  Side: Left lower extremity  Stability: No instability observed          Stability: No instability observed          Skin & Extremity Inspection: Skin color, temperature, and hair growth are WNL. No peripheral edema or cyanosis. No masses, redness, swelling, asymmetry, or associated skin lesions. No contractures.  Skin & Extremity Inspection: Skin color, temperature, and hair growth are WNL. No peripheral edema or cyanosis. No masses, redness, swelling, asymmetry, or associated skin lesions. No contractures.  Functional ROM: Unrestricted ROM                  Functional ROM: Unrestricted ROM                  Muscle   Tone/Strength: Functionally intact. No obvious neuro-muscular anomalies detected.  Muscle Tone/Strength: Functionally intact. No obvious neuro-muscular anomalies detected.  Sensory (Neurological): Unimpaired  Sensory (Neurological): Unimpaired  Palpation: No palpable anomalies  Palpation: No palpable anomalies   Assessment  Primary Diagnosis & Pertinent Problem List: The primary encounter diagnosis was Lumbar  spondylosis. Diagnoses of Chronic pain syndrome, DDD (degenerative disc disease), lumbar, and Chronic low back pain (Primary Source of Pain) (Right) were also pertinent to this visit.  Status Diagnosis  Persistent Controlled Controlled 1. Lumbar spondylosis   2. Chronic pain syndrome   3. DDD (degenerative disc disease), lumbar   4. Chronic low back pain (Primary Source of Pain) (Right)     Problems updated and reviewed during this visit: No problems updated. Plan of Care  Pharmacotherapy (Medications Ordered): Meds ordered this encounter  Medications  . traMADol (ULTRAM) 50 MG tablet    Sig: Take 1 tablet (50 mg total) by mouth every 6 (six) hours as needed.    Dispense:  120 tablet    Refill:  5    Fill one day early if pharmacy is closed on scheduled refill date. Do not fill until: 12/10/2017 To last until:04/09/2018    Order Specific Question:   Supervising Provider    Answer:   NAVEIRA, FRANCISCO [[id number]]   New Prescriptions   No medications on file   Medications administered today: Amanda Soto had no medications administered during this visit. Lab-work, procedure(s), and/or referral(s): No orders of the defined types were placed in this encounter.  Imaging and/or referral(s): None  Interventional therapies: Planned, scheduled, and/or pending:  Stop Elaquis for 3 days before all procedures   Considering:  Right-sided lumbar facet radiofrequency ablation under fluoroscopic guidance and IV sedation.    Palliative PRN treatment(s):  Palliative right-sided lumbar facet block under fluoroscopic guidance and IV sedation   Provider-requested follow-up: Return in about 6 months (around 06/11/2018).  Future Appointments  Date Time Provider Department Center  06/09/2018  1:45 PM Sonnenberg, Eric G, MD [facility]  06/11/2018 12:45 PM King, Crystal M, NP [facility] None  07/28/2018  1:00 PM O'Brien-Blaney, Denisa L, LPN [facility]   Primary Care Physician:  Sonnenberg, Eric G, MD Location: [facility] Note by: Crystal M. King NP Date: 12/10/2017; Time: 3:35 PM  Pain Score Disclaimer: We use the NRS-11 scale. This is a self-reported, subjective measurement of pain severity with only modest accuracy. It is used primarily to identify changes within a particular patient. It must be understood that outpatient pain scales are significantly less accurate that those used for research, where they can be applied under ideal controlled circumstances with minimal exposure to variables. In reality, the score is likely to be a combination of pain intensity and pain affect, where pain affect describes the degree of emotional arousal or changes in action readiness caused by the sensory experience of pain. Factors such as social and work situation, setting, emotional state, anxiety levels, expectation, and prior pain experience may influence pain perception and show large inter-individual differences that may also be affected by time variables.  Patient instructions provided during this appointment: Patient Instructions  ____________________________________________________________________________________________  Medication Rules  Applies to: All patients receiving prescriptions (written or electronic).  Pharmacy of record: Pharmacy where electronic prescriptions will be sent. If written prescriptions are taken to a different pharmacy, please inform the nursing staff. The pharmacy listed in the electronic medical record should be the one where you would like electronic prescriptions to be   sent.  Prescription refills: Only during scheduled appointments. Applies to both, written and electronic prescriptions.  NOTE: The following applies primarily to controlled substances (Opioid* Pain Medications).   Patient's responsibilities: 1. Pain Pills: Bring all pain pills to every appointment (except for procedure appointments). 2. Pill Bottles:  Bring pills in original pharmacy bottle. Always bring newest bottle. Bring bottle, even if empty. 3. Medication refills: You are responsible for knowing and keeping track of what medications you need refilled. The day before your appointment, write a list of all prescriptions that need to be refilled. Bring that list to your appointment and give it to the admitting nurse. Prescriptions will be written only during appointments. If you forget a medication, it will not be "Called in", "Faxed", or "electronically sent". You will need to get another appointment to get these prescribed. 4. Prescription Accuracy: You are responsible for carefully inspecting your prescriptions before leaving our office. Have the discharge nurse carefully go over each prescription with you, before taking them home. Make sure that your name is accurately spelled, that your address is correct. Check the name and dose of your medication to make sure it is accurate. Check the number of pills, and the written instructions to make sure they are clear and accurate. Make sure that you are given enough medication to last until your next medication refill appointment. 5. Taking Medication: Take medication as prescribed. Never take more pills than instructed. Never take medication more frequently than prescribed. Taking less pills or less frequently is permitted and encouraged, when it comes to controlled substances (written prescriptions).  6. Inform other Doctors: Always inform, all of your healthcare providers, of all the medications you take. 7. Pain Medication from other Providers: You are not allowed to accept any additional pain medication from any other Doctor or Healthcare provider. There are two exceptions to this rule. (see below) In the event that you require additional pain medication, you are responsible for notifying us, as stated below. 8. Medication Agreement: You are responsible for carefully reading and following our Medication  Agreement. This must be signed before receiving any prescriptions from our practice. Safely store a copy of your signed Agreement. Violations to the Agreement will result in no further prescriptions. (Additional copies of our Medication Agreement are available upon request.) 9. Laws, Rules, & Regulations: All patients are expected to follow all Federal and [employer], [employer], Rules, [employer]. Ignorance of the Laws does not constitute a valid excuse. The use of any illegal substances is prohibited. 10. Adopted CDC guidelines & recommendations: Target dosing levels will be at or below 60 MME/day. Use of benzodiazepines** is not recommended.  Exceptions: There are only two exceptions to the rule of not receiving pain medications from other Healthcare Providers. 1. Exception #1 (Emergencies): In the event of an emergency (i.e.: accident requiring emergency care), you are allowed to receive additional pain medication. However, you are responsible for: As soon as you are able, call our office [phone], at any time of the day or night, and leave a message stating your name, the date and nature of the emergency, and the name and dose of the medication prescribed. In the event that your call is answered by a member of our staff, make sure to document and save the date, time, and the name of the person that took your information.  2. Exception #2 (Planned Surgery): In the event that you are scheduled by another doctor or dentist to have any type of surgery or  procedure, you are allowed (for a period no longer than 30 days), to receive additional pain medication, for the acute post-op pain. However, in this case, you are responsible for picking up a copy of our "Post-op Pain Management for Surgeons" handout, and giving it to your surgeon or dentist. This document is available at our office, and does not require an appointment to obtain it. Simply go to our office during business hours (Monday-Thursday from  8:00 AM to 4:00 PM) (Friday 8:00 AM to 12:00 Noon) or if you have a scheduled appointment with [place], prior to your surgery, and ask for it by name. In addition, you will need to provide [place] with your name, name of your surgeon, type of surgery, and date of procedure or surgery.  *Opioid medications include: morphine, codeine, oxycodone, oxymorphone, hydrocodone, hydromorphone, meperidine, tramadol, tapentadol, buprenorphine, fentanyl, methadone. **Benzodiazepine medications include: diazepam (Valium), alprazolam (Xanax), clonazepam (Klonopine), lorazepam (Ativan), clorazepate (Tranxene), chlordiazepoxide (Librium), estazolam (Prosom), oxazepam (Serax), temazepam (Restoril), triazolam (Halcion) (Last updated: 08/29/2017) ____________________________________________________________________________________________

## 2017-12-10 NOTE — Patient Instructions (Addendum)
____________________________________________________________________________________________  Medication Rules  Applies to: All patients receiving prescriptions (written or electronic).  Pharmacy of record: Pharmacy where electronic prescriptions will be sent. If written prescriptions are taken to a different pharmacy, please inform the nursing staff. The pharmacy listed in the electronic medical record should be the one where you would like electronic prescriptions to be sent.  Prescription refills: Only during scheduled appointments. Applies to both, written and electronic prescriptions.  NOTE: The following applies primarily to controlled substances (Opioid* Pain Medications).   Patient's responsibilities: 1. Pain Pills: Bring all pain pills to every appointment (except for procedure appointments). 2. Pill Bottles: Bring pills in original pharmacy bottle. Always bring newest bottle. Bring bottle, even if empty. 3. Medication refills: You are responsible for knowing and keeping track of what medications you need refilled. The day before your appointment, write a list of all prescriptions that need to be refilled. Bring that list to your appointment and give it to the admitting nurse. Prescriptions will be written only during appointments. If you forget a medication, it will not be "Called in", "Faxed", or "electronically sent". You will need to get another appointment to get these prescribed. 4. Prescription Accuracy: You are responsible for carefully inspecting your prescriptions before leaving our office. Have the discharge nurse carefully go over each prescription with you, before taking them home. Make sure that your name is accurately spelled, that your address is correct. Check the name and dose of your medication to make sure it is accurate. Check the number of pills, and the written instructions to make sure they are clear and accurate. Make sure that you are given enough medication to last  until your next medication refill appointment. 5. Taking Medication: Take medication as prescribed. Never take more pills than instructed. Never take medication more frequently than prescribed. Taking less pills or less frequently is permitted and encouraged, when it comes to controlled substances (written prescriptions).  6. Inform other Doctors: Always inform, all of your healthcare providers, of all the medications you take. 7. Pain Medication from other Providers: You are not allowed to accept any additional pain medication from any other Doctor or Healthcare provider. There are two exceptions to this rule. (see below) In the event that you require additional pain medication, you are responsible for notifying us, as stated below. 8. Medication Agreement: You are responsible for carefully reading and following our Medication Agreement. This must be signed before receiving any prescriptions from our practice. Safely store a copy of your signed Agreement. Violations to the Agreement will result in no further prescriptions. (Additional copies of our Medication Agreement are available upon request.) 9. Laws, Rules, & Regulations: All patients are expected to follow all Federal and State Laws, Statutes, Rules, & Regulations. Ignorance of the Laws does not constitute a valid excuse. The use of any illegal substances is prohibited. 10. Adopted CDC guidelines & recommendations: Target dosing levels will be at or below 60 MME/day. Use of benzodiazepines** is not recommended.  Exceptions: There are only two exceptions to the rule of not receiving pain medications from other Healthcare Providers. 1. Exception #1 (Emergencies): In the event of an emergency (i.e.: accident requiring emergency care), you are allowed to receive additional pain medication. However, you are responsible for: As soon as you are able, call our office [phone], at any time of the day or night, and leave a message stating your name, the  date and nature of the emergency, and the name and dose of the medication   prescribed. In the event that your call is answered by a member of our staff, make sure to document and save the date, time, and the name of the person that took your information.  2. Exception #2 (Planned Surgery): In the event that you are scheduled by another doctor or dentist to have any type of surgery or procedure, you are allowed (for a period no longer than 30 days), to receive additional pain medication, for the acute post-op pain. However, in this case, you are responsible for picking up a copy of our "Post-op Pain Management for Surgeons" handout, and giving it to your surgeon or dentist. This document is available at our office, and does not require an appointment to obtain it. Simply go to our office during business hours (Monday-Thursday from 8:00 AM to 4:00 PM) (Friday 8:00 AM to 12:00 Noon) or if you have a scheduled appointment with us, prior to your surgery, and ask for it by name. In addition, you will need to provide us with your name, name of your surgeon, type of surgery, and date of procedure or surgery.  *Opioid medications include: morphine, codeine, oxycodone, oxymorphone, hydrocodone, hydromorphone, meperidine, tramadol, tapentadol, buprenorphine, fentanyl, methadone. **Benzodiazepine medications include: diazepam (Valium), alprazolam (Xanax), clonazepam (Klonopine), lorazepam (Ativan), clorazepate (Tranxene), chlordiazepoxide (Librium), estazolam (Prosom), oxazepam (Serax), temazepam (Restoril), triazolam (Halcion) (Last updated: 08/29/2017) ____________________________________________________________________________________________    

## 2017-12-10 NOTE — Progress Notes (Signed)
Nursing Pain Medication Assessment:  Safety precautions to be maintained throughout the outpatient stay will include: orient to surroundings, keep bed in low position, maintain call bell within reach at all times, provide assistance with transfer out of bed and ambulation.  Medication Inspection Compliance: Amanda Soto did not comply with our request to bring her pills to be counted. She was reminded that bringing the medication bottles, even when empty, is a requirement.  Medication: None brought in. Pill/Patch Count: None available to be counted. Bottle Appearance: No container available. Did not bring bottle(s) to appointment. Filled Date: N/A Last Medication intake:  Today

## 2017-12-11 ENCOUNTER — Other Ambulatory Visit: Payer: Self-pay | Admitting: Family Medicine

## 2018-01-06 ENCOUNTER — Other Ambulatory Visit: Payer: Self-pay | Admitting: Family Medicine

## 2018-01-08 DIAGNOSIS — H353132 Nonexudative age-related macular degeneration, bilateral, intermediate dry stage: Secondary | ICD-10-CM | POA: Diagnosis not present

## 2018-01-09 ENCOUNTER — Ambulatory Visit
Admission: RE | Admit: 2018-01-09 | Discharge: 2018-01-09 | Disposition: A | Discharge status: Home / Self Care | Payer: Medicare Other | Source: Ambulatory Visit | Attending: Pain Medicine | Admitting: Pain Medicine

## 2018-01-09 ENCOUNTER — Ambulatory Visit (HOSPITAL_BASED_OUTPATIENT_CLINIC_OR_DEPARTMENT_OTHER): Payer: Medicare Other | Admitting: Pain Medicine

## 2018-01-09 ENCOUNTER — Encounter: Payer: Self-pay | Admitting: Pain Medicine

## 2018-01-09 ENCOUNTER — Other Ambulatory Visit: Payer: Self-pay

## 2018-01-09 VITALS — BP 127/47 | HR 46 | Temp 97.1°F | Resp 16 | Ht 59.0 in | Wt 115.0 lb

## 2018-01-09 DIAGNOSIS — M47816 Spondylosis without myelopathy or radiculopathy, lumbar region: Secondary | ICD-10-CM

## 2018-01-09 DIAGNOSIS — Z79890 Hormone replacement therapy (postmenopausal): Secondary | ICD-10-CM | POA: Insufficient documentation

## 2018-01-09 DIAGNOSIS — Z91040 Latex allergy status: Secondary | ICD-10-CM

## 2018-01-09 DIAGNOSIS — M47817 Spondylosis without myelopathy or radiculopathy, lumbosacral region: Secondary | ICD-10-CM | POA: Diagnosis not present

## 2018-01-09 DIAGNOSIS — Z7901 Long term (current) use of anticoagulants: Secondary | ICD-10-CM | POA: Insufficient documentation

## 2018-01-09 DIAGNOSIS — M533 Sacrococcygeal disorders, not elsewhere classified: Secondary | ICD-10-CM | POA: Diagnosis not present

## 2018-01-09 DIAGNOSIS — Z885 Allergy status to narcotic agent status: Secondary | ICD-10-CM | POA: Insufficient documentation

## 2018-01-09 DIAGNOSIS — Z9181 History of falling: Secondary | ICD-10-CM

## 2018-01-09 DIAGNOSIS — G8929 Other chronic pain: Secondary | ICD-10-CM

## 2018-01-09 DIAGNOSIS — Z79899 Other long term (current) drug therapy: Secondary | ICD-10-CM | POA: Insufficient documentation

## 2018-01-09 DIAGNOSIS — M5388 Other specified dorsopathies, sacral and sacrococcygeal region: Secondary | ICD-10-CM | POA: Insufficient documentation

## 2018-01-09 DIAGNOSIS — Z881 Allergy status to other antibiotic agents status: Secondary | ICD-10-CM | POA: Diagnosis not present

## 2018-01-09 DIAGNOSIS — M545 Low back pain: Secondary | ICD-10-CM | POA: Diagnosis not present

## 2018-01-09 MED ORDER — ROPIVACAINE HCL 2 MG/ML IJ SOLN
INTRAMUSCULAR | Status: AC
  Filled 2018-01-09: qty 20

## 2018-01-09 MED ORDER — TRIAMCINOLONE ACETONIDE 40 MG/ML IJ SUSP
40.0000 mg | Freq: Once | INTRAMUSCULAR | Status: AC
  Administered 2018-01-09: 40 mg

## 2018-01-09 MED ORDER — MIDAZOLAM HCL 5 MG/5ML IJ SOLN
INTRAMUSCULAR | Status: AC
Start: 2018-01-09 — End: ?
  Filled 2018-01-09: qty 5

## 2018-01-09 MED ORDER — METHYLPREDNISOLONE ACETATE 80 MG/ML IJ SUSP
INTRAMUSCULAR | Status: AC
  Filled 2018-01-09: qty 1

## 2018-01-09 MED ORDER — TRIAMCINOLONE ACETONIDE 40 MG/ML IJ SUSP
INTRAMUSCULAR | Status: AC
  Filled 2018-01-09: qty 1

## 2018-01-09 MED ORDER — ROPIVACAINE HCL 2 MG/ML IJ SOLN
4.0000 mL | Freq: Once | INTRAMUSCULAR | Status: AC
  Administered 2018-01-09: 4 mL via INTRA_ARTICULAR

## 2018-01-09 MED ORDER — FENTANYL CITRATE (PF) 100 MCG/2ML IJ SOLN
25.0000 ug | INTRAMUSCULAR | Status: DC | PRN
  Administered 2018-01-09: 50 ug via INTRAVENOUS

## 2018-01-09 MED ORDER — LIDOCAINE HCL 2 % IJ SOLN
20.0000 mL | Freq: Once | INTRAMUSCULAR | Status: AC
  Administered 2018-01-09: 400 mg

## 2018-01-09 MED ORDER — LACTATED RINGERS IV SOLN
1000.0000 mL | Freq: Once | INTRAVENOUS | Status: AC
  Administered 2018-01-09: 1000 mL via INTRAVENOUS

## 2018-01-09 MED ORDER — MIDAZOLAM HCL 5 MG/5ML IJ SOLN
1.0000 mg | INTRAMUSCULAR | Status: DC | PRN
  Administered 2018-01-09: 1 mg via INTRAVENOUS

## 2018-01-09 MED ORDER — ROPIVACAINE HCL 2 MG/ML IJ SOLN
9.0000 mL | Freq: Once | INTRAMUSCULAR | Status: AC
  Administered 2018-01-09: 9 mL via PERINEURAL

## 2018-01-09 MED ORDER — METHYLPREDNISOLONE ACETATE 80 MG/ML IJ SUSP
80.0000 mg | Freq: Once | INTRAMUSCULAR | Status: AC
  Administered 2018-01-09: 80 mg via INTRA_ARTICULAR

## 2018-01-09 MED ORDER — LIDOCAINE HCL 2 % IJ SOLN
INTRAMUSCULAR | Status: AC
  Filled 2018-01-09: qty 20

## 2018-01-09 MED ORDER — FENTANYL CITRATE (PF) 100 MCG/2ML IJ SOLN
INTRAMUSCULAR | Status: AC
  Filled 2018-01-09: qty 2

## 2018-01-09 NOTE — Progress Notes (Signed)
Safety precautions to be maintained throughout the outpatient stay will include: orient to surroundings, keep bed in low position, maintain call bell within reach at all times, provide assistance with transfer out of bed and ambulation.  

## 2018-01-09 NOTE — Patient Instructions (Signed)

## 2018-01-09 NOTE — Progress Notes (Signed)
Patient's Name: Amanda Soto  MRN: [id number]  Referring Provider: Milinda Pointer, MD  DOB: 04/14/1925  PCP: [name], MD  DOS: 01/09/2018  Note by: Gaspar Cola, MD  Service setting: Ambulatory outpatient  Specialty: Interventional Pain Management  Patient type: Established  Location: [facility] (AMB) Pain Management Facility  Visit type: Interventional Procedure   Primary Reason for Visit: Interventional Pain Management Treatment. CC: Back Pain (lower)  Procedure:          Anesthesia, Analgesia, Anxiolysis:  Procedure #1: Type: Medial Branch Facet Block #3 Primary Purpose: Diagnostic Region: Lumbar Level: L2, L3, L4, L5, & S1 Medial Branch Level(s) Target Area: For Lumbar Facet blocks, the target is the groove formed by the junction of the transverse process and superior articular process. For the L5 dorsal ramus, the target is the notch between superior articular process and sacral ala. For the S1 dorsal ramus, the target is the superior and lateral edge of the posterior S1 Sacral foramen. Approach: Posterior, paramedial, percutaneous approach. Laterality: Right Position: Prone  Procedure #2: Type: Sacroiliac Joint Block #3  Primary Purpose: Diagnostic Region: Posterior Lumbosacral Level: PSIS (Posterior Superior Iliac Spine) Sacroiliac Joint Target Area: For upper sacroiliac joint block(s), the target is the superior and posterior margin of the sacroiliac joint. Approach: Ipsilateral approach. Laterality: Right Position: Prone  Type: Moderate (Conscious) Sedation combined with Local Anesthesia Indication(s): Analgesia and Anxiety Route: Intravenous (IV) IV Access: Secured Sedation: Meaningful verbal contact was maintained at all times during the procedure  Local Anesthetic: Lidocaine 1-2%   Indications: 1. Spondylosis without myelopathy or radiculopathy, lumbosacral region   2. Lumbar facet syndrome (Right)   3. Lumbar facet arthropathy   4. Other specified  dorsopathies, sacral and sacrococcygeal region   5. Chronic sacroiliac joint pain (Right)   6. Chronic low back pain (Primary Source of Pain) (Right)    Pain Score: Pre-procedure: 5 /10 Post-procedure: 0-No pain/10  Pre-op Assessment:  Amanda Soto is a 84 y.o. (year old), female patient, seen today for interventional treatment. She  has a past surgical history that includes Cataract extraction; Partial hysterectomy; Appendectomy; and Tonsillectomy. [name] has a current medication list which includes the following prescription(s): acetaminophen, apixaban, cyanocobalamin, furosemide, furosemide, multivitamin, ocuvite eye health formula, sotalol, synthroid, and tramadol, and the following Facility-Administered Medications: fentanyl and midazolam. Her primarily concern today is the Back Pain (lower)  Initial Vital Signs:  Pulse/HCG Rate: (!) 43ECG Heart Rate: (!) 42 Temp: 98.3 F (36.8 C) Resp: 16 BP: (!) 151/80 SpO2: 99 %  BMI: Estimated body mass index is 23.23 kg/m as calculated from the following:   Height as of this encounter: 4\' 11"  (1.499 m).   Weight as of this encounter: 115 lb (52.2 kg).  Risk Assessment: Allergies: Reviewed. She is allergic to hydrocodone; ciprofloxacin; and latex.  Allergy Precautions: None required Coagulopathies: Reviewed. None identified.  Blood-thinner therapy: None at this time Active Infection(s): Reviewed. None identified. Amanda Soto is afebrile  Site Confirmation: Amanda Soto was asked to confirm the procedure and laterality before marking the site Procedure checklist: Completed Consent: Before the procedure and under the influence of no sedative(s), amnesic(s), or anxiolytics, the patient was informed of the treatment options, risks and possible complications. To fulfill our ethical and legal obligations, as recommended by the American Medical Association's Code of Ethics, I have informed the patient of my clinical impression; the nature and purpose of  the treatment or procedure; the risks, benefits, and possible complications of the intervention; the  alternatives, including doing nothing; the risk(s) and benefit(s) of the alternative treatment(s) or procedure(s); and the risk(s) and benefit(s) of doing nothing. The patient was provided information about the general risks and possible complications associated with the procedure. These may include, but are not limited to: failure to achieve desired goals, infection, bleeding, organ or nerve damage, allergic reactions, paralysis, and death. In addition, the patient was informed of those risks and complications associated to Spine-related procedures, such as failure to decrease pain; infection (i.e.: Meningitis, epidural or intraspinal abscess); bleeding (i.e.: epidural hematoma, subarachnoid hemorrhage, or any other type of intraspinal or peri-dural bleeding); organ or nerve damage (i.e.: Any type of peripheral nerve, nerve root, or spinal cord injury) with subsequent damage to sensory, motor, and/or autonomic systems, resulting in permanent pain, numbness, and/or weakness of one or several areas of the body; allergic reactions; (i.e.: anaphylactic reaction); and/or death. Furthermore, the patient was informed of those risks and complications associated with the medications. These include, but are not limited to: allergic reactions (i.e.: anaphylactic or anaphylactoid reaction(s)); adrenal axis suppression; blood sugar elevation that in diabetics may result in ketoacidosis or comma; water retention that in patients with history of congestive heart failure may result in shortness of breath, pulmonary edema, and decompensation with resultant heart failure; weight gain; swelling or edema; medication-induced neural toxicity; particulate matter embolism and blood vessel occlusion with resultant organ, and/or nervous system infarction; and/or aseptic necrosis of one or more joints. Finally, the patient was informed  that Medicine is not an exact science; therefore, there is also the possibility of unforeseen or unpredictable risks and/or possible complications that may result in a catastrophic outcome. The patient indicated having understood very clearly. We have given the patient no guarantees and we have made no promises. Enough time was given to the patient to ask questions, all of which were answered to the patient's satisfaction. Ms. Gadway has indicated that she wanted to continue with the procedure. Attestation: I, the ordering provider, attest that I have discussed with the patient the benefits, risks, side-effects, alternatives, likelihood of achieving goals, and potential problems during recovery for the procedure that I have provided informed consent. Date  Time: 01/09/2018  1:29 PM  Pre-Procedure Preparation:  Monitoring: As per clinic protocol. Respiration, ETCO2, SpO2, BP, heart rate and rhythm monitor placed and checked for adequate function Safety Precautions: Patient was assessed for positional comfort and pressure points before starting the procedure. Time-out: I initiated and conducted the "Time-out" before starting the procedure, as per protocol. The patient was asked to participate by confirming the accuracy of the "Time Out" information. Verification of the correct person, site, and procedure were performed and confirmed by me, the nursing staff, and the patient. "Time-out" conducted as per Joint Commission's Universal Protocol (UP.01.01.01). Time: 1351  Description of Procedure #1:   Time-out: "Time-out" completed before starting procedure, as per protocol. Area Prepped: Entire Posterior Lumbosacral Region Prepping solution: ChloraPrep (2% chlorhexidine gluconate and 70% isopropyl alcohol) Safety Precautions: Aspiration looking for blood return was conducted prior to all injections. At no point did we inject any substances, as a needle was being advanced. No attempts were made at seeking any  paresthesias. Safe injection practices and needle disposal techniques used. Medications properly checked for expiration dates. SDV (single dose vial) medications used.  Description of the Procedure: Protocol guidelines were followed. The patient was placed in position over the fluoroscopy table. The target area was identified and the area prepped in the usual manner. Skin & deeper  tissues infiltrated with local anesthetic. Appropriate amount of time allowed to pass for local anesthetics to take effect. The procedure needle was introduced through the skin, ipsilateral to the reported pain, and advanced to the target area. Employing the "Medial Branch Technique", the needles were advanced to the angle made by the superior and medial portion of the transverse process, and the lateral and inferior portion of the superior articulating process of the targeted vertebral bodies. This area is known as "Burton's Eye" or the "Eye of the [place] Dog". A procedure needle was introduced through the skin, and this time advanced to the angle made by the superior and medial border of the sacral ala, and the lateral border of the S1 vertebral body. This last needle was later repositioned at the superior and lateral border of the posterior S1 foramen. Negative aspiration confirmed. Solution injected in intermittent fashion, asking for systemic symptoms every 0.5cc of injectate. The needles were then removed and the area cleansed, making sure to leave some of the prepping solution back to take advantage of its long term bactericidal properties. Start Time: 1351 hrs. Materials:  Needle(s) Type: Regular needle Gauge: 22G Length: 3.5-in Medication(s): Please see orders for medications and dosing details.  Description of Procedure # 2:   Area Prepped: Entire Posterior Lumbosacral Region Prepping solution: ChloraPrep (2% chlorhexidine gluconate and 70% isopropyl alcohol) Safety Precautions: Aspiration looking for blood return  was conducted prior to all injections. At no point did we inject any substances, as a needle was being advanced. No attempts were made at seeking any paresthesias. Safe injection practices and needle disposal techniques used. Medications properly checked for expiration dates. SDV (single dose vial) medications used. Description of the Procedure: Protocol guidelines were followed. The patient was placed in position over the fluoroscopy table. The target area was identified and the area prepped in the usual manner. Skin & deeper tissues infiltrated with local anesthetic. Appropriate amount of time allowed to pass for local anesthetics to take effect. The procedure needle was advanced under fluoroscopic guidance into the sacroiliac joint until a firm endpoint was obtained. Proper needle placement secured. Negative aspiration confirmed. Solution injected in intermittent fashion, asking for systemic symptoms every 0.5cc of injectate. The needles were then removed and the area cleansed, making sure to leave some of the prepping solution back to take advantage of its long term bactericidal properties. Vitals:   01/09/18 1400 01/09/18 1410 01/09/18 1420 01/09/18 1430  BP: 108/79 (!) 113/47 (!) 127/47 (!) 127/47  Pulse: (!) 46     Resp: 16 14 17 16   Temp:  (!) 97.1 F (36.2 C)    TempSrc:      SpO2: 95% 95% 97% 98%  Weight:      Height:        End Time: 1359 hrs. Materials:  Needle(s) Type: Regular needle Gauge: 22G Length: 3.5-in Medication(s): Please see orders for medications and dosing details.  Imaging Guidance (Spinal):          Type of Imaging Technique: Fluoroscopy Guidance (Spinal) Indication(s): Assistance in needle guidance and placement for procedures requiring needle placement in or near specific anatomical locations not easily accessible without such assistance. Exposure Time: Please see nurses notes. Contrast: None used. Fluoroscopic Guidance: I was personally present during the use  of fluoroscopy. "Tunnel Vision Technique" used to obtain the best possible view of the target area. Parallax error corrected before commencing the procedure. "Direction-depth-direction" technique used to introduce the needle under continuous pulsed fluoroscopy. Once target was  reached, antero-posterior, oblique, and lateral fluoroscopic projection used confirm needle placement in all planes. Images permanently stored in EMR. Interpretation: No contrast injected. I personally interpreted the imaging intraoperatively. Adequate needle placement confirmed in multiple planes. Permanent images saved into the patient's record.  Antibiotic Prophylaxis:   Anti-infectives (From admission, onward)   None     Indication(s): None identified  Post-operative Assessment:  Post-procedure Vital Signs:  Pulse/HCG Rate: (!) 46(!) 46 Temp: (!) 97.1 F (36.2 C) Resp: 16 BP: (!) 127/47 SpO2: 98 %  EBL: None  Complications: No immediate post-treatment complications observed by team, or reported by patient.  Note: The patient tolerated the entire procedure well. A repeat set of vitals were taken after the procedure and the patient was kept under observation following institutional policy, for this type of procedure. Post-procedural neurological assessment was performed, showing return to baseline, prior to discharge. The patient was provided with post-procedure discharge instructions, including a section on how to identify potential problems. Should any problems arise concerning this procedure, the patient was given instructions to immediately contact us, at any time, without hesitation. In any case, we plan to contact the patient by telephone for a follow-up status report regarding this interventional procedure.  Comments:  No additional relevant information.  Plan of Care   Interventional management options: Planned, scheduled, and/or pending:   NOTE: Stop Elaquis for 3 days prior to a procedure.     Considering:   Therapeutic  right-sided lumbar facet+ right-sided SI joint RFA #1    Palliative PRN treatment(s):   Palliative right-sided lumbar facet block#4  Palliative right-sided sacroiliac joint block #4     Imaging Orders     DG C-Arm 1-60 Min-No Report  Procedure Orders     LUMBAR FACET(MEDIAL BRANCH NERVE BLOCK) MBNB     SACROILIAC JOINT INJECTION  Medications ordered for procedure: Meds ordered this encounter  Medications  . lidocaine (XYLOCAINE) 2 % (with pres) injection 400 mg  . midazolam (VERSED) 5 MG/5ML injection 1-2 mg    Make sure Flumazenil is available in the pyxis when using this medication. If oversedation occurs, administer 0.2 mg IV over 15 sec. If after 45 sec no response, administer 0.2 mg again over 1 min; may repeat at 1 min intervals; not to exceed 4 doses (1 mg)  . fentaNYL (SUBLIMAZE) injection 25-50 mcg    Make sure Narcan is available in the pyxis when using this medication. In the event of respiratory depression (RR< 8/min): Titrate NARCAN (naloxone) in increments of 0.1 to 0.2 mg IV at 2-3 minute intervals, until desired degree of reversal.  . lactated ringers infusion 1,000 mL  . ropivacaine (PF) 2 mg/mL (0.2%) (NAROPIN) injection 9 mL  . triamcinolone acetonide (KENALOG-40) injection 40 mg  . ropivacaine (PF) 2 mg/mL (0.2%) (NAROPIN) injection 4 mL  . methylPREDNISolone acetate (DEPO-MEDROL) injection 80 mg   Medications administered: We administered lidocaine, midazolam, fentaNYL, lactated ringers, ropivacaine (PF) 2 mg/mL (0.2%), triamcinolone acetonide, ropivacaine (PF) 2 mg/mL (0.2%), and methylPREDNISolone acetate.  See the medical record for exact dosing, route, and time of administration.  New Prescriptions   No medications on file   Disposition: Discharge home  Discharge Date & Time: 01/09/2018; 1435 hrs.   Physician-requested Follow-up: Return for post-procedure eval (2 wks), w/ Dr. Dossie Arbour.  Future Appointments  Date  Time Provider [place]  01/16/2018  9:15 AM Gardiner Barefoot, DPM [facility]  01/20/2018  1:45 PM Milinda Pointer, MD [facility] None  06/09/2018  1:45 PM Caryl Bis,  Angela Adam, MD [facility]  06/11/2018 12:45 PM Vevelyn Francois, NP [facility] None  07/28/2018  1:00 PM O'Brien-Blaney, Bryson Corona, LPN [facility]   Primary Care Physician: [name], MD Location: [facility] Note by: Gaspar Cola, MD Date: 01/09/2018; Time: 3:22 PM  Disclaimer:  Medicine is not an Chief Strategy Officer. The only guarantee in medicine is that nothing is guaranteed. It is important to note that the decision to proceed with this intervention was based on the information collected from the patient. The Data and conclusions were drawn from the patient's questionnaire, the interview, and the physical examination. Because the information was provided in large part by the patient, it cannot be guaranteed that it has not been purposely or unconsciously manipulated. Every effort has been made to obtain as much relevant data as possible for this evaluation. It is important to note that the conclusions that lead to this procedure are derived in large part from the available data. Always take into account that the treatment will also be dependent on availability of resources and existing treatment guidelines, considered by other Pain Management Practitioners as being common knowledge and practice, at the time of the intervention. For Medico-Legal purposes, it is also important to point out that variation in procedural techniques and pharmacological choices are the acceptable norm. The indications, contraindications, technique, and results of the above procedure should only be interpreted and judged by a Board-Certified Interventional Pain Specialist with extensive familiarity and expertise in the same exact procedure and technique.

## 2018-01-10 ENCOUNTER — Telehealth: Payer: Self-pay

## 2018-01-10 NOTE — Telephone Encounter (Signed)
Post procedure phone call.  Patient states she is doing great so far.

## 2018-01-14 ENCOUNTER — Ambulatory Visit: Payer: Medicare Other | Admitting: Pain Medicine

## 2018-01-16 ENCOUNTER — Encounter: Payer: Self-pay | Admitting: Podiatry

## 2018-01-16 ENCOUNTER — Ambulatory Visit: Payer: Medicare Other | Admitting: Podiatry

## 2018-01-16 DIAGNOSIS — D689 Coagulation defect, unspecified: Secondary | ICD-10-CM

## 2018-01-16 DIAGNOSIS — M79609 Pain in unspecified limb: Secondary | ICD-10-CM

## 2018-01-16 DIAGNOSIS — B351 Tinea unguium: Secondary | ICD-10-CM

## 2018-01-16 NOTE — Progress Notes (Signed)
Complaint:  Visit Type: Patient returns to my office for continued preventative foot care services. Complaint: Patient states" my nails have grown long and thick and become painful to walk and wear shoes" Patient has been taking eliquiss. The patient presents for preventative foot care services. No changes to ROS  Podiatric Exam: Vascular: dorsalis pedis and posterior tibial pulses are palpable bilateral. Capillary return is immediate. Temperature gradient is WNL. Skin turgor WNL  Sensorium: Normal Semmes Weinstein monofilament test. Normal tactile sensation bilaterally. Nail Exam: Pt has thick disfigured discolored nails with subungual debris noted bilateral entire nail hallux through fifth toenails Ulcer Exam: There is no evidence of ulcer or pre-ulcerative changes or infection. Orthopedic Exam: Muscle tone and strength are WNL. No limitations in general ROM. No crepitus or effusions noted. Foot type and digits show no abnormalities. Bony prominences are unremarkable. Skin: No Porokeratosis. No infection or ulcers  Diagnosis:  Onychomycosis, , Pain in right toe, pain in left toes  Treatment & Plan Procedures and Treatment: Consent by patient was obtained for treatment procedures.   Debridement of mycotic and hypertrophic toenails, 1 through 5 bilateral and clearing of subungual debris. No ulceration, no infection noted. Return Visit-Office Procedure: Patient instructed to return to the office for a follow up visit 3 months for continued evaluation and treatment.    Haadi Santellan DPM 

## 2018-01-20 ENCOUNTER — Encounter: Payer: Self-pay | Admitting: Pain Medicine

## 2018-01-20 ENCOUNTER — Ambulatory Visit: Payer: Medicare Other | Attending: Pain Medicine | Admitting: Pain Medicine

## 2018-01-20 VITALS — BP 163/46 | HR 40 | Temp 97.9°F | Resp 16 | Ht <= 58 in | Wt 112.0 lb

## 2018-01-20 DIAGNOSIS — K219 Gastro-esophageal reflux disease without esophagitis: Secondary | ICD-10-CM | POA: Diagnosis not present

## 2018-01-20 DIAGNOSIS — G8929 Other chronic pain: Secondary | ICD-10-CM | POA: Diagnosis not present

## 2018-01-20 DIAGNOSIS — M5388 Other specified dorsopathies, sacral and sacrococcygeal region: Secondary | ICD-10-CM | POA: Insufficient documentation

## 2018-01-20 DIAGNOSIS — M533 Sacrococcygeal disorders, not elsewhere classified: Secondary | ICD-10-CM | POA: Diagnosis not present

## 2018-01-20 DIAGNOSIS — I4891 Unspecified atrial fibrillation: Secondary | ICD-10-CM | POA: Insufficient documentation

## 2018-01-20 DIAGNOSIS — I5022 Chronic systolic (congestive) heart failure: Secondary | ICD-10-CM | POA: Diagnosis not present

## 2018-01-20 DIAGNOSIS — Z881 Allergy status to other antibiotic agents status: Secondary | ICD-10-CM | POA: Diagnosis not present

## 2018-01-20 DIAGNOSIS — M810 Age-related osteoporosis without current pathological fracture: Secondary | ICD-10-CM | POA: Diagnosis not present

## 2018-01-20 DIAGNOSIS — I110 Hypertensive heart disease with heart failure: Secondary | ICD-10-CM | POA: Diagnosis not present

## 2018-01-20 DIAGNOSIS — Z79890 Hormone replacement therapy (postmenopausal): Secondary | ICD-10-CM | POA: Diagnosis not present

## 2018-01-20 DIAGNOSIS — M5124 Other intervertebral disc displacement, thoracic region: Secondary | ICD-10-CM | POA: Diagnosis not present

## 2018-01-20 DIAGNOSIS — M47897 Other spondylosis, lumbosacral region: Secondary | ICD-10-CM | POA: Diagnosis not present

## 2018-01-20 DIAGNOSIS — Z79899 Other long term (current) drug therapy: Secondary | ICD-10-CM | POA: Diagnosis not present

## 2018-01-20 DIAGNOSIS — M47816 Spondylosis without myelopathy or radiculopathy, lumbar region: Secondary | ICD-10-CM

## 2018-01-20 DIAGNOSIS — R739 Hyperglycemia, unspecified: Secondary | ICD-10-CM | POA: Diagnosis not present

## 2018-01-20 DIAGNOSIS — F419 Anxiety disorder, unspecified: Secondary | ICD-10-CM | POA: Diagnosis not present

## 2018-01-20 DIAGNOSIS — Z7901 Long term (current) use of anticoagulants: Secondary | ICD-10-CM | POA: Diagnosis not present

## 2018-01-20 DIAGNOSIS — M545 Low back pain: Secondary | ICD-10-CM | POA: Diagnosis not present

## 2018-01-20 DIAGNOSIS — M5136 Other intervertebral disc degeneration, lumbar region: Secondary | ICD-10-CM | POA: Diagnosis not present

## 2018-01-20 DIAGNOSIS — M47896 Other spondylosis, lumbar region: Secondary | ICD-10-CM | POA: Diagnosis not present

## 2018-01-20 DIAGNOSIS — M47817 Spondylosis without myelopathy or radiculopathy, lumbosacral region: Secondary | ICD-10-CM | POA: Diagnosis not present

## 2018-01-20 DIAGNOSIS — M1610 Unilateral primary osteoarthritis, unspecified hip: Secondary | ICD-10-CM | POA: Diagnosis not present

## 2018-01-20 DIAGNOSIS — Z885 Allergy status to narcotic agent status: Secondary | ICD-10-CM | POA: Diagnosis not present

## 2018-01-20 DIAGNOSIS — E785 Hyperlipidemia, unspecified: Secondary | ICD-10-CM | POA: Insufficient documentation

## 2018-01-20 DIAGNOSIS — E039 Hypothyroidism, unspecified: Secondary | ICD-10-CM | POA: Insufficient documentation

## 2018-01-20 DIAGNOSIS — G894 Chronic pain syndrome: Secondary | ICD-10-CM | POA: Diagnosis not present

## 2018-01-20 NOTE — Patient Instructions (Signed)
____________________________________________________________________________________________  Preparing for Procedure with Sedation  Instructions: . Oral Intake: Do not eat or drink anything for at least 8 hours prior to your procedure. . Transportation: Public transportation is not allowed. Bring an adult driver. The driver must be physically present in our waiting room before any procedure can be started. . Physical Assistance: Bring an adult physically capable of assisting you, in the event you need help. This adult should keep you company at home for at least 6 hours after the procedure. . Blood Pressure Medicine: Take your blood pressure medicine with a sip of water the morning of the procedure. . Blood thinners: Notify our staff if you are taking any blood thinners. Depending on which one you take, there will be specific instructions on how and when to stop it. . Diabetics on insulin: Notify the staff so that you can be scheduled 1st case in the morning. If your diabetes requires high dose insulin, take only  of your normal insulin dose the morning of the procedure and notify the staff that you have done so. . Preventing infections: Shower with an antibacterial soap the morning of your procedure. . Build-up your immune system: Take 1000 mg of Vitamin C with every meal (3 times a day) the day prior to your procedure. . Antibiotics: Inform the staff if you have a condition or reason that requires you to take antibiotics before dental procedures. . Pregnancy: If you are pregnant, call and cancel the procedure. . Sickness: If you have a cold, fever, or any active infections, call and cancel the procedure. . Arrival: You must be in the facility at least 30 minutes prior to your scheduled procedure. . Children: Do not bring children with you. . Dress appropriately: Bring dark clothing that you would not mind if they get stained. . Valuables: Do not bring any jewelry or valuables.  Procedure  appointments are reserved for interventional treatments only. . No Prescription Refills. . No medication changes will be discussed during procedure appointments. . No disability issues will be discussed.  Reasons to call and reschedule or cancel your procedure: (Following these recommendations will minimize the risk of a serious complication.) . Surgeries: Avoid having procedures within 2 weeks of any surgery. (Avoid for 2 weeks before or after any surgery). . Flu Shots: Avoid having procedures within 2 weeks of a flu shots or . (Avoid for 2 weeks before or after immunizations). . Barium: Avoid having a procedure within 7-10 days after having had a radiological study involving the use of radiological contrast. (Myelograms, Barium swallow or enema study). . Heart attacks: Avoid any elective procedures or surgeries for the initial 6 months after a "Myocardial Infarction" (Heart Attack). . Blood thinners: It is imperative that you stop these medications before procedures. Let us know if you if you take any blood thinner.  . Infection: Avoid procedures during or within two weeks of an infection (including chest colds or gastrointestinal problems). Symptoms associated with infections include: Localized redness, fever, chills, night sweats or profuse sweating, burning sensation when voiding, cough, congestion, stuffiness, runny nose, sore throat, diarrhea, nausea, vomiting, cold or Flu symptoms, recent or current infections. It is specially important if the infection is over the area that we intend to treat. . Heart and lung problems: Symptoms that may suggest an active cardiopulmonary problem include: cough, chest pain, breathing difficulties or shortness of breath, dizziness, ankle swelling, uncontrolled high or unusually low blood pressure, and/or palpitations. If you are experiencing any of these symptoms, cancel   your procedure and contact your primary care physician for an evaluation.  Remember:   Regular Business hours are:  Monday to Thursday 8:00 AM to 4:00 PM  Provider's Schedule: Nathaneal Sommers, MD:  Procedure days: Tuesday and Thursday 7:30 AM to 4:00 PM  Bilal Lateef, MD:  Procedure days: Monday and Wednesday 7:30 AM to 4:00 PM ____________________________________________________________________________________________    

## 2018-01-20 NOTE — Progress Notes (Signed)
Patient's Name: ANNELLA Soto  MRN: [id number]  Referring Provider: [name], MD  DOB: 1924/09/27  PCP: [name], MD  DOS: 01/20/2018  Note by: Gaspar Cola, MD  Service setting: Ambulatory outpatient  Specialty: Interventional Pain Management  Location: [facility] (AMB) Pain Management Facility    Patient type: Established   Primary Reason(s) for Visit: Encounter for post-procedure evaluation of chronic illness with mild to moderate exacerbation CC: Back Pain (right)  HPI  Amanda Soto is a 84 y.o. year old, female patient, who comes today for a post-procedure evaluation. She has Chronic systolic heart failure ([place]); Bradycardia; Atrial fibrillation ([place]); HTN (hypertension); Abdominal discomfort; Exertional shortness of breath; Elevated glucose; Hypothyroidism; Lightheadedness; Symptomatic anemia; Anemia; Anxiety; Degeneration of intervertebral disc of lumbar region; Recurrent major depressive disorder, in partial remission ([place]); Cardiac murmur; H/O neoplasm; HLD (hyperlipidemia); MI (mitral incompetence); OP (osteoporosis); Chronic low back pain (Primary Source of Pain) (Right); Lumbar facet syndrome (Right); Grade 1 Anterolisthesis of L4 over L5 (5 mm); Lumbar spondylosis; Scoliosis of lumbar spine (concave to right side); Lumbar facet arthropathy; Osteoarthritis of hip (Right); Thoracic paracentral T7-8 disc protrusion; Macular degeneration; Chronic pain syndrome; Chronic fatigue; Closed Colles' fracture; Dysuria; History of fall; Chronic sacroiliac joint pain (Right); DDD (degenerative disc disease), lumbar; Long term current use of anticoagulant (Elaquis); Actinic keratoses; Muscle strain; History of nonmelanoma skin cancer; Spondylosis without myelopathy or radiculopathy, lumbosacral region; Other specified dorsopathies, sacral and sacrococcygeal region; and History of allergy to latex on their problem list. Her primarily concern today is the Back Pain (right)  Pain  Assessment: Location: Lower, Right Back Radiating: not going down the right leg as much  Duration: Chronic pain Quality: Discomfort Severity: 0-No pain/10 (subjective, self-reported pain score)  Note: Reported level is compatible with observation.                               Effect on ADL: pain was realy severe on Saturday but reports that she had mopped her floor that morning.  BP: (!) 163/46  HR: (!) 40  Amanda Soto comes in today for post-procedure evaluation after the treatment done on 01/10/2018.  Further details on both, my assessment(s), as well as the proposed treatment plan, please see below.  Post-Procedure Assessment  01/09/2018 Procedure: Diagnostic right-sided lumbar facet block #3 + right sided sacroiliac joint block #3 under fluoroscopic guidance and IV sedation Pre-procedure pain score:  5/10 Post-procedure pain score: 0/10 (100% relief) Influential Factors: BMI: 23.41 kg/m Intra-procedural challenges: None observed.         Assessment challenges: None detected.              Reported side-effects: None.        Post-procedural adverse reactions or complications: None reported         Sedation: Sedation provided. When no sedatives are used, the analgesic levels obtained are directly associated to the effectiveness of the local anesthetics. However, when sedation is provided, the level of analgesia obtained during the initial 1 hour following the intervention, is believed to be the result of a combination of factors. These factors may include, but are not limited to: 1. The effectiveness of the local anesthetics used. 2. The effects of the analgesic(s) and/or anxiolytic(s) used. 3. The degree of discomfort experienced by the patient at the time of the procedure. 4. The patients ability and reliability in recalling and recording the events. 5. The presence and influence  of possible secondary gains and/or psychosocial factors. Reported result: Relief experienced during the  1st hour after the procedure: 100 % (Ultra-Short Term Relief)            Interpretative annotation: Clinically appropriate result. Analgesia during this period is likely to be Local Anesthetic and/or IV Sedative (Analgesic/Anxiolytic) related.          Effects of local anesthetic: The analgesic effects attained during this period are directly associated to the localized infiltration of local anesthetics and therefore cary significant diagnostic value as to the etiological location, or anatomical origin, of the pain. Expected duration of relief is directly dependent on the pharmacodynamics of the local anesthetic used. Long-acting (4-6 hours) anesthetics used.  Reported result: Relief during the next 4 to 6 hour after the procedure: 100 % (Short-Term Relief)            Interpretative annotation: Clinically appropriate result. Analgesia during this period is likely to be Local Anesthetic-related.          Long-term benefit: Defined as the period of time past the expected duration of local anesthetics (1 hour for short-acting and 4-6 hours for long-acting). With the possible exception of prolonged sympathetic blockade from the local anesthetics, benefits during this period are typically attributed to, or associated with, other factors such as analgesic sensory neuropraxia, antiinflammatory effects, or beneficial biochemical changes provided by agents other than the local anesthetics.  Reported result: Extended relief following procedure: 90 % (Long-Term Relief)            Interpretative annotation: Clinically appropriate result. Good relief. Therapeutic success. Inflammation plays a part in the etiology to the pain.          Current benefits: Defined as reported results that persistent at this point in time.   Analgesia: >75 % Amanda Soto reports improvement of arthralgia. Function: Amanda Soto reports improvement in function ROM: Amanda Soto reports improvement in ROM Interpretative annotation: Ongoing  benefit. Therapeutic benefit observed. Effective therapeutic approach.          Interpretation: Results would suggest a successful palliative intervention.                  Plan:  Set up procedure as a PRN palliative treatment option for this patient. Approximately a year ago the patient underwent a right sided lumbar facet radiofrequency ablation under fluoroscopic guidance and IV sedation. This provided the patient with excellent benefit that lasted for several months. Unfortunately, she also had some pain in the SI joint that we'll believe did not allow for her to enjoy the full benefit of the radiofrequency. We confirmed this by way of doing diagnostic injections around the lumbar facets and SI joint on the right side, the combination of which provided her with 100% relief of the pain for the duration of the local anesthetic and in addition to that it has provided her with additional longer lasting benefit secondary to steroids. Unfortunately, the pain continues to return. At this point her pain is under control and therefore there is nothing that we need to. However, when the pain begins to return we will then proceed with a radiofrequency of the lumbar facets on the SI joint on the right side.          Laboratory Chemistry  Inflammation Markers (CRP: Acute Phase) (ESR: Chronic Phase) Lab Results  Component Value Date   ESRSEDRATE 18 07/25/2016  Renal Markers Lab Results  Component Value Date   BUN 27 (H) 12/06/2017   CREATININE 0.49 (L) 12/06/2017   BCR 55 (H) 12/06/2017   GFRAA >60 11/10/2015   GFRNONAA >60 11/10/2015                             Hepatic Markers Lab Results  Component Value Date   AST 22 06/04/2017   ALT 15 06/04/2017   ALBUMIN 4.1 06/04/2017                        Neuropathy Markers Lab Results  Component Value Date   HGBA1C 5.9 07/12/2017                        Hematology Parameters Lab Results  Component Value Date   INR 1.3  02/05/2014   LABPROT 15.5 (H) 02/05/2014   APTT < 23.0 (L) 02/05/2014   PLT 228.0 06/04/2017   HGB 13.6 06/04/2017   HCT 41.8 06/04/2017                        CV Markers Lab Results  Component Value Date   CKTOTAL 31 02/05/2014   CKMB 2.2 02/05/2014   TROPONINI <0.03 11/09/2015                         Note: Lab results reviewed.  Recent Diagnostic Imaging Results  DG C-Arm 1-60 Min-No Report Fluoroscopy was utilized by the requesting physician.  No radiographic  interpretation.   Complexity Note: I personally reviewed the fluoroscopic imaging of the procedure.                        Meds   Current Outpatient Medications:  .  acetaminophen (TYLENOL) 325 MG tablet, Take 650 mg by mouth every 6 (six) hours as needed for moderate pain, fever or headache., Disp: , Rfl:  .  apixaban (ELIQUIS) 2.5 MG TABS tablet, Take 1 tablet (2.5 mg total) by mouth 2 (two) times daily., Disp: 180 tablet, Rfl: 3 .  Cyanocobalamin (VITAMIN B 12 PO), Take 1,000 mg by mouth daily., Disp: , Rfl:  .  furosemide (LASIX) 20 MG tablet, Take 1 tablet (20 mg total) by mouth daily., Disp: 90 tablet, Rfl: 1 .  Multiple Vitamin (MULTIVITAMIN) capsule, Take 1 capsule by mouth daily. , Disp: , Rfl:  .  Multiple Vitamins-Minerals (OCUVITE EYE HEALTH FORMULA) CAPS, Take 1 capsule by mouth daily., Disp: , Rfl:  .  sotalol (BETAPACE) 80 MG tablet, Take 40 mg by mouth 2 (two) times daily. , Disp: , Rfl:  .  SYNTHROID 50 MCG tablet, TAKE 1 TABLET (50 MCG TOTAL) BY MOUTH DAILY BEFORE BREAKFAST., Disp: 90 tablet, Rfl: 1 .  traMADol (ULTRAM) 50 MG tablet, Take 1 tablet (50 mg total) by mouth every 6 (six) hours as needed., Disp: 120 tablet, Rfl: 5  ROS  Constitutional: Denies any fever or chills Gastrointestinal: No reported hemesis, hematochezia, vomiting, or acute GI distress Musculoskeletal: Denies any acute onset joint swelling, redness, loss of ROM, or weakness Neurological: No reported episodes of acute onset  apraxia, aphasia, dysarthria, agnosia, amnesia, paralysis, loss of coordination, or loss of consciousness  Allergies  Ms. Comes is allergic to hydrocodone; ciprofloxacin; and latex.  PFSH  Drug: Ms. Collyer  reports that she does  not use drugs. Alcohol:  reports that she drinks about 0.6 oz of alcohol per week. Tobacco:  reports that she has never smoked. She has never used smokeless tobacco. Medical:  has a past medical history of Acute postoperative pain (12/17/2016), Arthritis, Atrial fibrillation ([place]), CHF (congestive heart failure) ([place]), Chickenpox, Chronic abdominal pain (01/16/2014), Degenerative arthritis of hip (08/19/2014), Degenerative arthritis of lumbar spine (11/18/2013), Depression, Diverticulitis, GERD (gastroesophageal reflux disease), Heart murmur, Heart rate slow, Hyperlipidemia, Hypertension, Hypothyroid, Neuritis or radiculitis due to rupture of lumbar intervertebral disc (11/18/2013), Skin cancer, and Trochanteric bursitis of right hip (11/18/2013). Surgical: Ms. Druckenmiller  has a past surgical history that includes Cataract extraction; Partial hysterectomy; Appendectomy; and Tonsillectomy. Family: family history includes Breast cancer in her mother and sister; Heart disease in her unknown relative; Hypertension in her unknown relative; Stroke in her maternal grandmother.  Constitutional Exam  General appearance: Well nourished, well developed, and well hydrated. In no apparent acute distress Vitals:   01/20/18 1351  BP: (!) 163/46  Pulse: (!) 40  Resp: 16  Temp: 97.9 F (36.6 C)  SpO2: 100%  Weight: 112 lb (50.8 kg)  Height: 4' 10" (1.473 m)   BMI Assessment: Estimated body mass index is 23.41 kg/m as calculated from the following:   Height as of this encounter: 4' 10" (1.473 m).   Weight as of this encounter: 112 lb (50.8 kg).  BMI interpretation table: BMI level Category Range association with higher incidence of chronic pain  <18 kg/m2 Underweight   18.5-24.9 kg/m2  Ideal body weight   25-29.9 kg/m2 Overweight Increased incidence by 20%  30-34.9 kg/m2 Obese (Class I) Increased incidence by 68%  35-39.9 kg/m2 Severe obesity (Class II) Increased incidence by 136%  >40 kg/m2 Extreme obesity (Class III) Increased incidence by 254%   Patient's current BMI Ideal Body weight  Body mass index is 23.41 kg/m. Patient must be at least 60 in tall to calculate ideal body weight   BMI Readings from Last 4 Encounters:  01/20/18 23.41 kg/m  01/09/18 23.23 kg/m  12/10/17 23.43 kg/m  12/06/17 24.75 kg/m   Wt Readings from Last 4 Encounters:  01/20/18 112 lb (50.8 kg)  01/09/18 115 lb (52.2 kg)  12/10/17 118 lb (53.5 kg)  12/06/17 118 lb 6.4 oz (53.7 kg)  Psych/Mental status: Alert, oriented x 3 (person, place, & time)       Eyes: PERLA Respiratory: No evidence of acute respiratory distress  Cervical Spine Area Exam  Skin & Axial Inspection: No masses, redness, edema, swelling, or associated skin lesions Alignment: Symmetrical Functional ROM: Unrestricted ROM      Stability: No instability detected Muscle Tone/Strength: Functionally intact. No obvious neuro-muscular anomalies detected. Sensory (Neurological): Unimpaired Palpation: No palpable anomalies              Upper Extremity (UE) Exam    Side: Right upper extremity  Side: Left upper extremity  Skin & Extremity Inspection: Skin color, temperature, and hair growth are WNL. No peripheral edema or cyanosis. No masses, redness, swelling, asymmetry, or associated skin lesions. No contractures.  Skin & Extremity Inspection: Skin color, temperature, and hair growth are WNL. No peripheral edema or cyanosis. No masses, redness, swelling, asymmetry, or associated skin lesions. No contractures.  Functional ROM: Unrestricted ROM          Functional ROM: Unrestricted ROM          Muscle Tone/Strength: Functionally intact. No obvious neuro-muscular anomalies detected.  Muscle Tone/Strength: Functionally intact. No  obvious  neuro-muscular anomalies detected.  Sensory (Neurological): Unimpaired          Sensory (Neurological): Unimpaired          Palpation: No palpable anomalies              Palpation: No palpable anomalies              Provocative Test(s):  Phalen's test: deferred Tinel's test: deferred Apley's scratch test (touch opposite shoulder):  Action 1 (Across chest): deferred Action 2 (Overhead): deferred Action 3 (LB reach): deferred   Provocative Test(s):  Phalen's test: deferred Tinel's test: deferred Apley's scratch test (touch opposite shoulder):  Action 1 (Across chest): deferred Action 2 (Overhead): deferred Action 3 (LB reach): deferred    Thoracic Spine Area Exam  Skin & Axial Inspection: No masses, redness, or swelling Alignment: Symmetrical Functional ROM: Unrestricted ROM Stability: No instability detected Muscle Tone/Strength: Functionally intact. No obvious neuro-muscular anomalies detected. Sensory (Neurological): Unimpaired Muscle strength & Tone: No palpable anomalies  Lumbar Spine Area Exam  Skin & Axial Inspection: No masses, redness, or swelling Alignment: Symmetrical Functional ROM: Improved after treatment       Stability: No instability detected Muscle Tone/Strength: Functionally intact. No obvious neuro-muscular anomalies detected. Sensory (Neurological): Improved Palpation: No palpable anomalies       Provocative Tests: Lumbar Hyperextension/rotation test: (+) on the right for facet joint pain. Lumbar quadrant test (Kemp's test): (+) on the right for facet joint pain. Lumbar Lateral bending test: deferred today       Patrick's Maneuver: (+) for right-sided S-I arthralgia             FABER test: (+) for right-sided S-I arthralgia             Thigh-thrust test: deferred today       S-I compression test: deferred today       S-I distraction test: deferred today        Gait & Posture Assessment  Ambulation: Patient ambulates using a walker Gait:  Significantly limited. Dependent on assistive device to ambulate Posture: Antalgic   Lower Extremity Exam    Side: Right lower extremity  Side: Left lower extremity  Stability: No instability observed          Stability: No instability observed          Skin & Extremity Inspection: Skin color, temperature, and hair growth are WNL. No peripheral edema or cyanosis. No masses, redness, swelling, asymmetry, or associated skin lesions. No contractures.  Skin & Extremity Inspection: Skin color, temperature, and hair growth are WNL. No peripheral edema or cyanosis. No masses, redness, swelling, asymmetry, or associated skin lesions. No contractures.  Functional ROM: Decreased ROM for all joints of the lower extremity          Functional ROM: Decreased ROM for all joints of the lower extremity          Muscle Tone/Strength: Functionally intact. No obvious neuro-muscular anomalies detected.  Muscle Tone/Strength: Functionally intact. No obvious neuro-muscular anomalies detected.  Sensory (Neurological): Unimpaired  Sensory (Neurological): Unimpaired  Palpation: No palpable anomalies  Palpation: No palpable anomalies   Assessment  Primary Diagnosis & Pertinent Problem List: The primary encounter diagnosis was Chronic low back pain (Primary Source of Pain) (Right). Diagnoses of Lumbar facet syndrome (Right), Spondylosis without myelopathy or radiculopathy, lumbosacral region, Chronic sacroiliac joint pain (Right), Other specified dorsopathies, sacral and sacrococcygeal region, and Chronic pain syndrome were also pertinent to this visit.  Status Diagnosis  Improved Improved  Controlled 1. Chronic low back pain (Primary Source of Pain) (Right)   2. Lumbar facet syndrome (Right)   3. Spondylosis without myelopathy or radiculopathy, lumbosacral region   4. Chronic sacroiliac joint pain (Right)   5. Other specified dorsopathies, sacral and sacrococcygeal region   6. Chronic pain syndrome     Problems  updated and reviewed during this visit: No problems updated. Plan of Care  Pharmacotherapy (Medications Ordered): No orders of the defined types were placed in this encounter.  Medications administered today: [name] had no medications administered during this visit.   Procedure Orders     Radiofrequency,Lumbar     Radiofrequency Sacroiliac Joint Lab Orders  No laboratory test(s) ordered today   Imaging Orders  No imaging studies ordered today   Referral Orders  No referral(s) requested today   Interventional management options: Planned, scheduled, and/or pending: NOTE: Stop Elaquis for 3 days prior to a procedure. Therapeutic right-sided lumbar facet #2+ right-sided SI joint RFA #1(PRN)   Considering: Therapeutic right-sided lumbar facet+ right-sided SI joint RFA #1   Palliative PRN treatment(s): Palliative right-sided lumbar facet block#4  Palliative right-sided sacroiliac joint block#4    Provider-requested follow-up: Return for PRN RFA (fluoro + sedation): (R) L-FCT RFA #2 + (R) S-I RFA #1.  Future Appointments  Date Time Provider [place]  04/21/2018  9:45 AM Gardiner Barefoot, DPM [facility]  06/09/2018  1:45 PM [name], MD [facility]  06/11/2018 12:45 PM Vevelyn Francois, NP [facility] None  07/28/2018  1:00 PM O'Brien-Blaney, Bryson Corona, LPN [facility]   Primary Care Physician: [name], MD Location: [facility] Note by: Gaspar Cola, MD Date: 01/20/2018; Time: 2:34 PM

## 2018-02-05 IMAGING — RF DG UGI W/ SMALL BOWEL
11 of 19 series · 12 of 24 positions shown · non-contrast
Comparison: None.

CLINICAL DATA: Recent blood transfusion; ? Bleeding.

EXAM:
UPPER GI SERIES WITH SMALL BOWEL FOLLOW-THROUGH
FLUOROSCOPY TIME:  Radiation Exposure Index (as provided by the
fluoroscopic device): 15.4 mGy
TECHNIQUE: Combined double contrast and single contrast upper GI series using
effervescent crystals, thick barium, and thin barium. Subsequently,
serial images of the small bowel were obtained including spot views
of the terminal ileum.

[Series 1: t abdomen supine · 0.15mm/px · 1 of 1 slices shown]
[im 1/1]
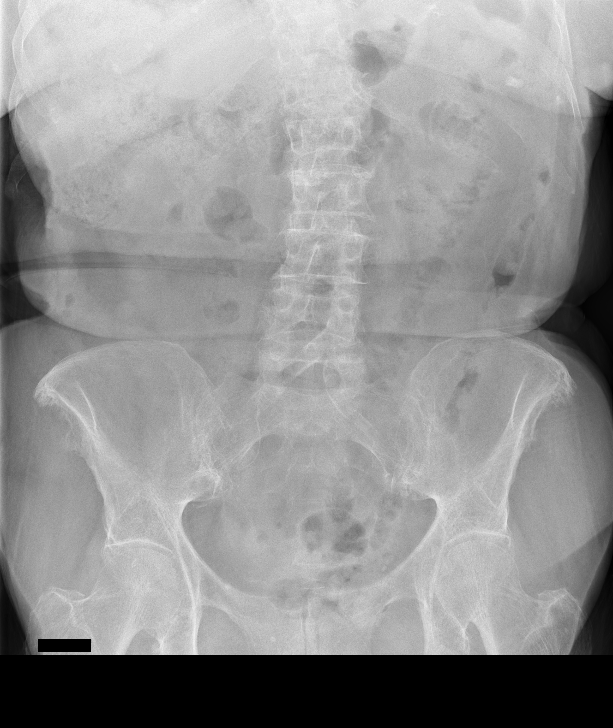

[Series 3: cp_standard · 0.51mm/px · 2 of 29 frames shown (1 of 9)]
[frame 15/29]
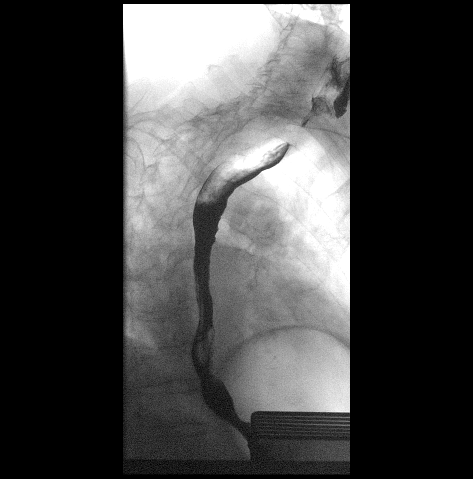
[frame 29/29]
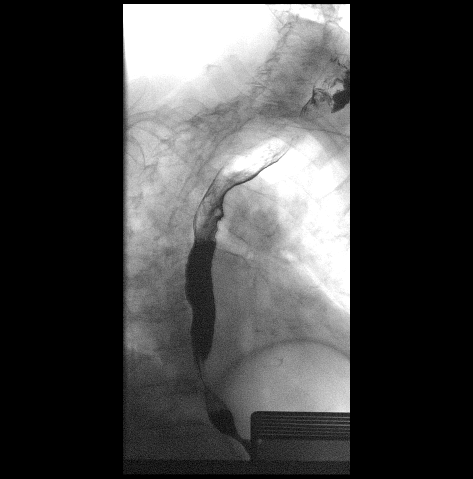

[Series 4: cp_standard · 0.51mm/px · 1 of 11 frames shown (2 of 9)]
[frame 9/11]
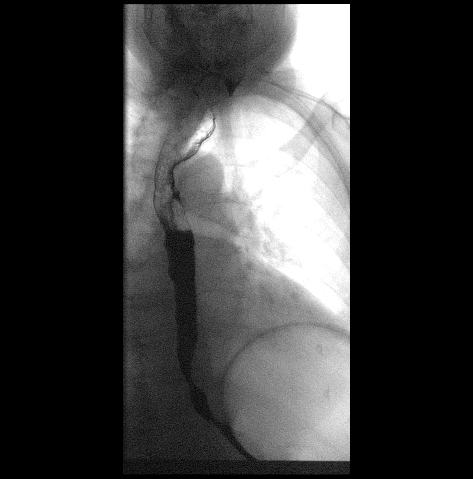

[Series 5: cp_standard · 0.25mm/px · 1 of 1 slices shown (3 of 9)]
[im 1/1]
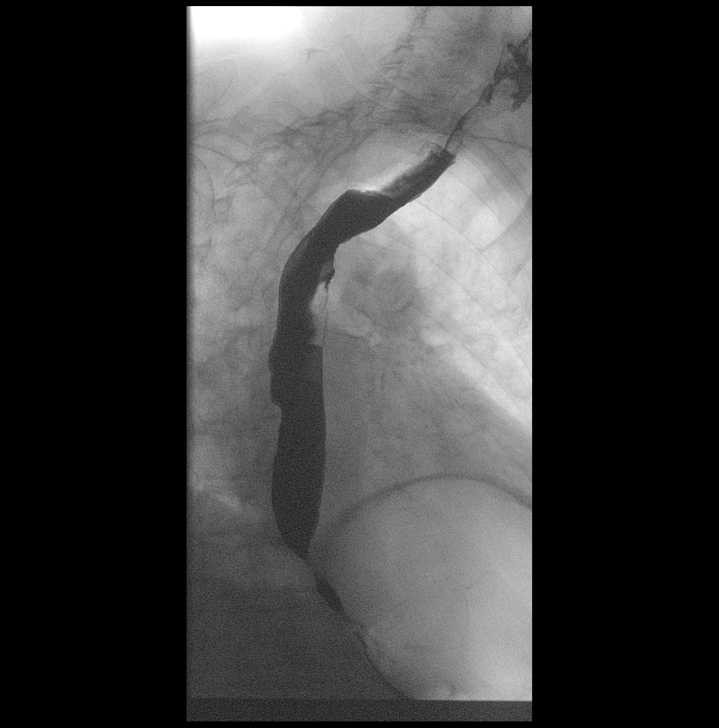

[Series 8: cp_standard · 0.28mm/px · 1 of 1 slices shown (4 of 9)]
[im 1/1]
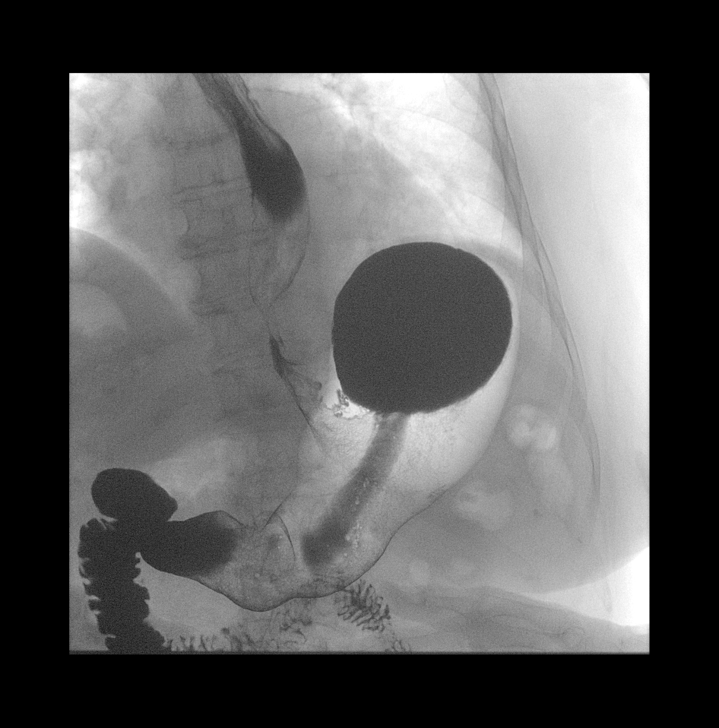

[Series 10: cp_standard · 0.28mm/px · 1 of 1 slices shown (5 of 9)]
[im 1/1]
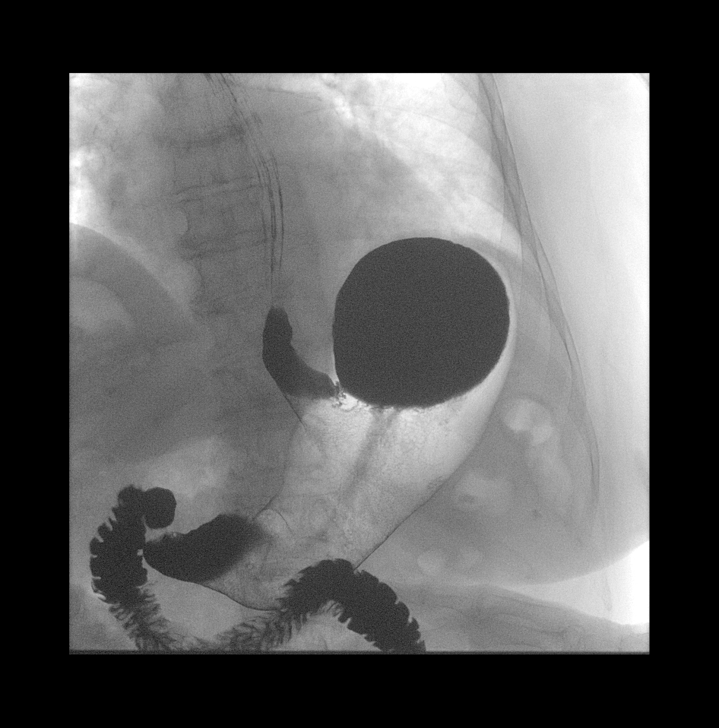

[Series 13: cp_standard · 0.28mm/px · 1 of 1 slices shown (6 of 9)]
[im 1/1]
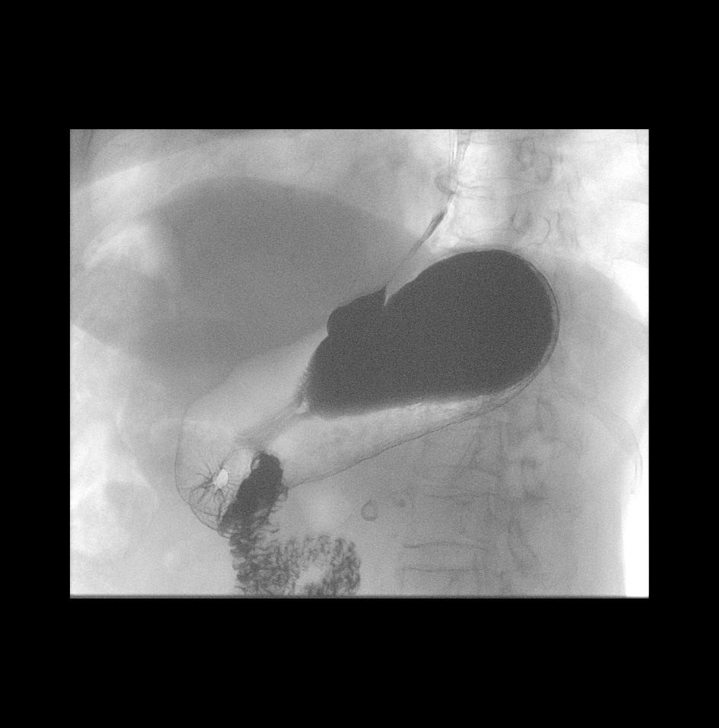

[Series 15: cp_standard · 0.28mm/px · 1 of 1 slices shown (7 of 9)]
[im 1/1]
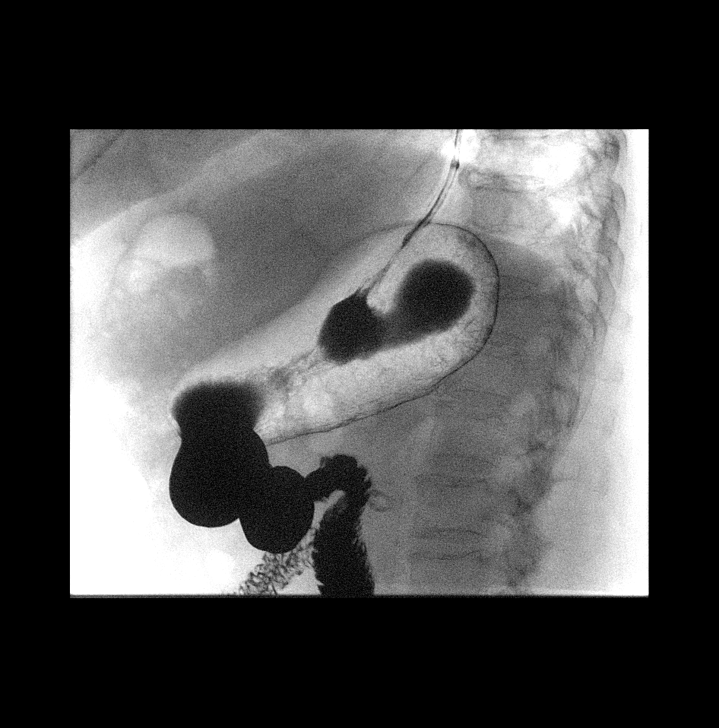

[Series 18: fluoro_barium swallow 2fps_bw · 0.19mm/px · 1 of 1 slices shown]
[im 1/1]
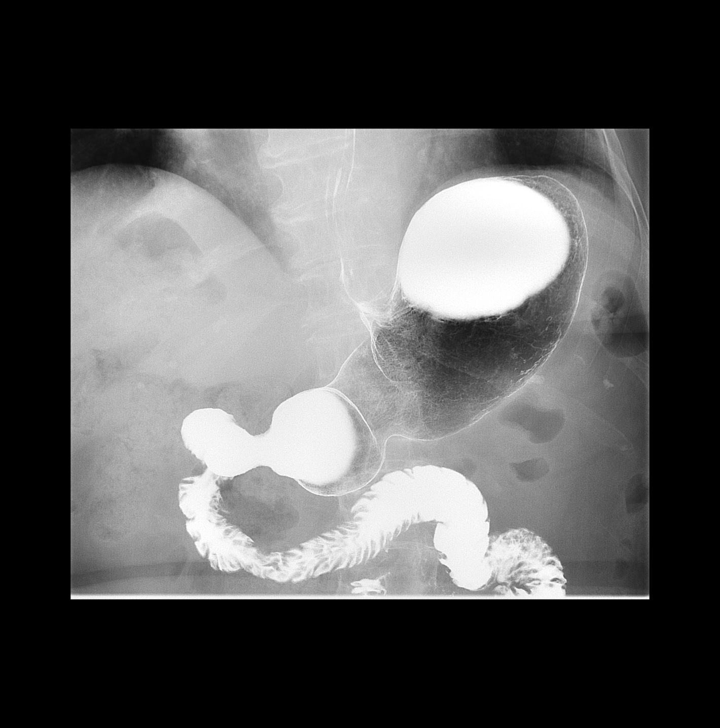

[Series 20: cp_standard · 0.27mm/px · 1 of 1 slices shown (8 of 9)]
[im 1/1]
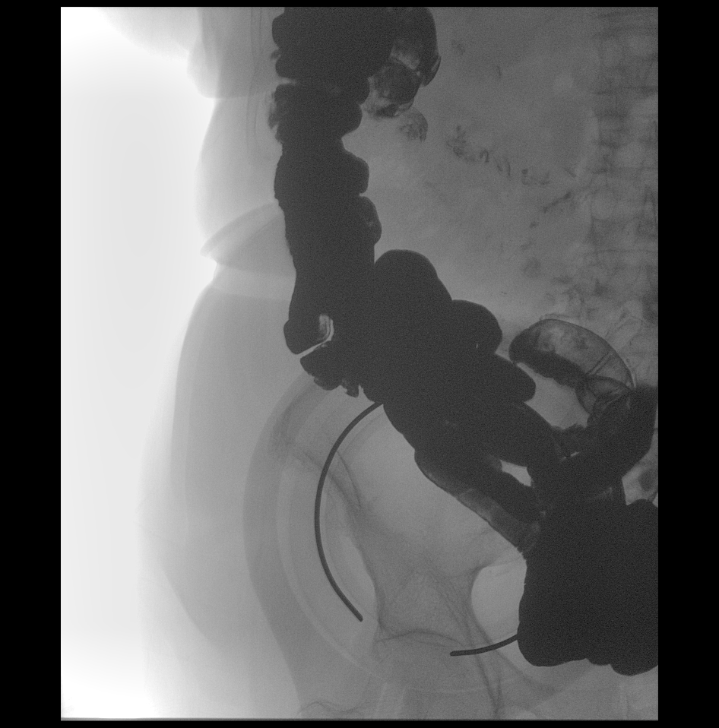

[Series 23: cp_standard · 0.27mm/px · 1 of 1 slices shown (9 of 9)]
[im 1/1]
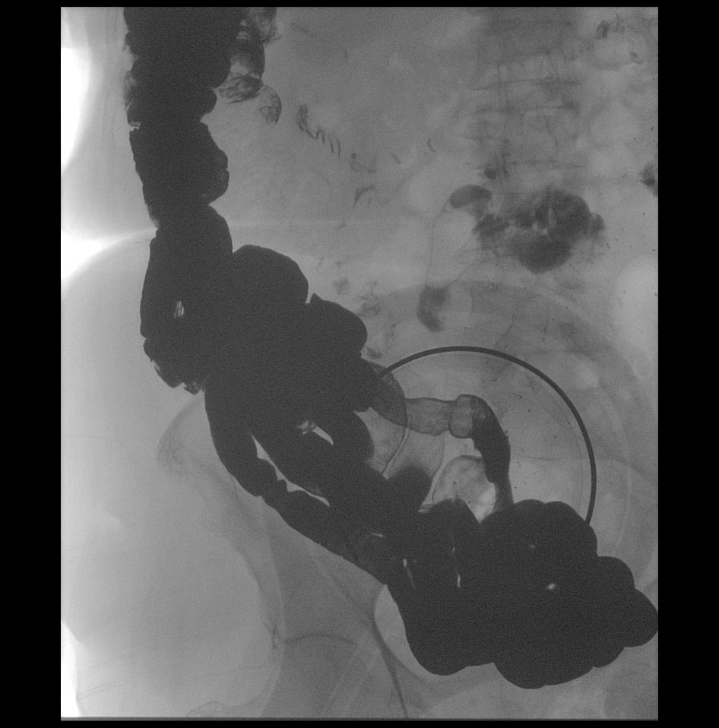

[12 of 24 positions shown; findings below may reference images not displayed]

FINDINGS: Examination of the esophagus demonstrated normal esophageal
motility. Normal esophageal morphology without evidence of
esophagitis or ulceration. No esophageal stricture, diverticula, or
mass lesion. No evidence of hiatal hernia. There is mild
gastroesophageal reflux.

Examination of the stomach demonstrated normal rugal folds and areae
gastricae. The gastric mucosa appeared unremarkable without evidence
of ulceration, scarring, or mass lesion. Gastric motility and
emptying was normal. Fluoroscopic examination of the duodenum
demonstrates normal motility and morphology without evidence of
ulceration or mass lesion.

Medium density barium was periodically observed under fluoroscopy to
travel from the stomach to the ascending colon (over a 150 minute
time period). There is no evidence of small bowel stricture or
obstruction. No large filling defects to suggest mass lesion. In
addition, there is no evidence of tethering or definite inflammatory
changes present within the small bowel.
IMPRESSION: 1. Mild gastroesophageal reflux.
2. Otherwise normal upper GI and small-bowel follow-through.

## 2018-02-06 ENCOUNTER — Other Ambulatory Visit: Payer: Self-pay | Admitting: Family Medicine

## 2018-02-19 ENCOUNTER — Ambulatory Visit: Payer: Self-pay | Admitting: *Deleted

## 2018-02-19 NOTE — Telephone Encounter (Signed)
Pt called with having swelling in her right thigh, outer part. Has been like this for several weeks. Area tender to touch. She stated she had received an injection in her back for her back pain and wonders if this is related to it. She denies fever, shortness of breath or chest pain. No change in ambulation. She gets around in the house ok and uses a walker outside the house. She is on a blood thinner and has afib.  Appointment scheduled per pt request and protocol.  Advised to go to urgent care if the pain increases or has a fever. Pt voiced understanding.  Reason for Disposition . [1] MODERATE leg swelling (e.g., swelling extends up to knees) AND [2] new onset or worsening    Pt has right thigh swelling, tender to touch. No redness or discoloration. No change in ambulation  Answer Assessment - Initial Assessment Questions 1. ONSET: "When did the swelling start?" (e.g., minutes, hours, days)     For a few weeks 2. LOCATION: "What part of the leg is swollen?"  "Are both legs swollen or just one leg?"     Right thigh 3. SEVERITY: "How bad is the swelling?" (e.g., localized; mild, moderate, severe)  - Localized - small area of swelling localized to one leg  - MILD pedal edema - swelling limited to foot and ankle, pitting edema < 1/4 inch (6 mm) deep, rest and elevation eliminate most or all swelling  - MODERATE edema - swelling of lower leg to knee, pitting edema > 1/4 inch (6 mm) deep, rest and elevation only partially reduce swelling  - SEVERE edema - swelling extends above knee, facial or hand swelling present      Local swelling 4. REDNESS: "Does the swelling look red or infected?"     No redness 5. PAIN: "Is the swelling painful to touch?" If so, ask: "How painful is it?"   (Scale 1-10; mild, moderate or severe)     Tender to touch 6. FEVER: "Do you have a fever?" If so, ask: "What is it, how was it measured, and when did it start?"      no 7. CAUSE: "What do you think is causing the  leg swelling?"     Not sure 8. MEDICAL HISTORY: "Do you have a history of heart failure, kidney disease, liver failure, or cancer?"     no 9. RECURRENT SYMPTOM: "Have you had leg swelling before?" If so, ask: "When was the last time?" "What happened that time?"     no 10. OTHER SYMPTOMS: "Do you have any other symptoms?" (e.g., chest pain, difficulty breathing)       no  Protocols used: LEG SWELLING AND EDEMA-A-AH

## 2018-02-20 ENCOUNTER — Ambulatory Visit (INDEPENDENT_AMBULATORY_CARE_PROVIDER_SITE_OTHER): Payer: Medicare Other | Admitting: Family Medicine

## 2018-02-20 ENCOUNTER — Encounter: Payer: Self-pay | Admitting: Family Medicine

## 2018-02-20 VITALS — BP 124/66 | HR 44 | Temp 98.3°F | Resp 14 | Wt 116.0 lb

## 2018-02-20 DIAGNOSIS — R001 Bradycardia, unspecified: Secondary | ICD-10-CM | POA: Diagnosis not present

## 2018-02-20 DIAGNOSIS — R5383 Other fatigue: Secondary | ICD-10-CM | POA: Diagnosis not present

## 2018-02-20 DIAGNOSIS — I499 Cardiac arrhythmia, unspecified: Secondary | ICD-10-CM | POA: Diagnosis not present

## 2018-02-20 DIAGNOSIS — R2241 Localized swelling, mass and lump, right lower limb: Secondary | ICD-10-CM

## 2018-02-20 DIAGNOSIS — I480 Paroxysmal atrial fibrillation: Secondary | ICD-10-CM | POA: Diagnosis not present

## 2018-02-20 MED ORDER — CEPHALEXIN 500 MG PO CAPS: 500.0000 mg | ORAL_CAPSULE | Freq: Two times a day (BID) | ORAL | 0 refills | Status: AC

## 2018-02-20 NOTE — Progress Notes (Signed)
Subjective:    Patient ID: Amanda Soto, female    DOB: 20-Oct-1924, 84 y.o.   MRN: [id number]  HPI   Patient presents to clinic with lump in right upper leg. She states she is concerned it could be a blood clot. It is painful at times, but not all of the time. No fever or chills.   Patient Active Problem List   Diagnosis Date Noted  . Spondylosis without myelopathy or radiculopathy, lumbosacral region 01/09/2018  . Other specified dorsopathies, sacral and sacrococcygeal region 01/09/2018  . History of allergy to latex 01/09/2018  . Muscle strain 12/06/2017  . Actinic keratoses 06/04/2017  . Long term current use of anticoagulant (Elaquis) 04/24/2017  . DDD (degenerative disc disease), lumbar 04/09/2017  . Chronic sacroiliac joint pain (Right) 04/04/2017  . Dysuria 02/11/2017  . History of fall 02/11/2017  . Closed Colles' fracture 12/17/2016  . Chronic fatigue 07/25/2016  . Chronic pain syndrome 05/29/2016  . Macular degeneration 01/20/2016  . Lumbar spondylosis 11/15/2015  . Scoliosis of lumbar spine (concave to right side) 11/15/2015  . Lumbar facet arthropathy 11/15/2015  . Osteoarthritis of hip (Right) 11/15/2015  . Thoracic paracentral T7-8 disc protrusion 11/15/2015  . Cardiac murmur 11/14/2015  . HLD (hyperlipidemia) 11/14/2015  . MI (mitral incompetence) 11/14/2015  . OP (osteoporosis) 11/14/2015  . Chronic low back pain (Primary Source of Pain) (Right) 11/14/2015  . Lumbar facet syndrome (Right) 11/14/2015  . Grade 1 Anterolisthesis of L4 over L5 (5 mm) 11/14/2015  . Anemia 11/10/2015  . Symptomatic anemia 11/09/2015  . Hypothyroidism 11/07/2015  . Lightheadedness 11/07/2015  . Elevated glucose 09/07/2015  . Exertional shortness of breath 08/25/2015  . Abdominal discomfort 07/27/2015  . Chronic systolic heart failure ([place]) 11/09/2014  . Bradycardia 11/09/2014  . Atrial fibrillation ([place]) 11/09/2014  . HTN (hypertension) 11/09/2014  . Anxiety 01/16/2014  .  Recurrent major depressive disorder, in partial remission ([place]) 01/16/2014  . Degeneration of intervertebral disc of lumbar region 11/18/2013  . H/O neoplasm 10/21/2012  . History of nonmelanoma skin cancer 10/21/2012   Social History   Tobacco Use  . Smoking status: Never Smoker  . Smokeless tobacco: Never Used  Substance Use Topics  . Alcohol use: Yes    Alcohol/week: 1.0 standard drinks    Types: 1 Glasses of wine per week    Comment: rare   Review of Systems  Constitutional: Negative for chills, fatigue and fever.  HENT: Negative for congestion, ear pain, sinus pain and sore throat.   Eyes: Negative.   Respiratory: Negative for cough, shortness of breath and wheezing.   Cardiovascular: Negative for chest pain, palpitations and leg swelling.  Gastrointestinal: Negative for abdominal pain, diarrhea, nausea and vomiting.  Genitourinary: Negative for dysuria, frequency and urgency.  Musculoskeletal: Negative for arthralgias and myalgias.  Skin: "knot" like area in right thigh Neurological: Negative for syncope, light-headedness and headaches.  Psychiatric/Behavioral: The patient is not nervous/anxious.       Objective:   Physical Exam  Constitutional: She is oriented to person, place, and time. She appears well-developed and well-nourished. No distress.  Eyes: No scleral icterus.  Cardiovascular: Regular rhythm and normal heart sounds.  Pulmonary/Chest: Effort normal and breath sounds normal. No respiratory distress.  Musculoskeletal: She exhibits no deformity.       Legs: Walks with walker.  Small marble-sized lump felt in right upper leg, marked with red X on diagram, it is only felt with deep palpation. Skin does not appear red, is not  hot to touch.   Neurological: She is alert and oriented to person, place, and time.  Skin: Skin is warm and dry. No erythema. No pallor.  Psychiatric: She has a normal mood and affect. Her behavior is normal.  Nursing note and vitals  reviewed.  Vitals:   02/20/18 1631  BP: 124/66  Pulse: (!) 44  Resp: 14  Temp: 98.3 F (36.8 C)  SpO2: 97%      Assessment & Plan:   Lump on right thigh -- Reassured patient I do not this this area is a blood clot. It could be a small inflamed cyst and because it is painful to patient we agreed on treating with a 7 day course of keflex to see if this helps.   Advised to call office right away if area becomes larger, red, hot, fever develops.  Keep regularly scheduled follow up as planned on 06/09/2018

## 2018-02-20 NOTE — Patient Instructions (Signed)
Great to meet you! 

## 2018-02-21 ENCOUNTER — Encounter: Payer: Self-pay | Admitting: Family Medicine

## 2018-03-12 ENCOUNTER — Other Ambulatory Visit: Payer: Self-pay | Admitting: Family Medicine

## 2018-03-17 ENCOUNTER — Telehealth: Payer: Self-pay

## 2018-03-17 NOTE — Telephone Encounter (Signed)
Pt called and requesting earlier appt for RF, she is in a lot of pain. After looking at schedule I dont see any earlier appt's than her Oct 15th. Can Dr Dossie Arbour add on an RF or any suggestions for pt.

## 2018-03-17 NOTE — Telephone Encounter (Signed)
Spoke to annette and she is working to get this patient taken care of.

## 2018-03-21 ENCOUNTER — Other Ambulatory Visit: Payer: Self-pay | Admitting: Family Medicine

## 2018-03-27 ENCOUNTER — Ambulatory Visit
Admission: RE | Admit: 2018-03-27 | Discharge: 2018-03-27 | Disposition: A | Discharge status: Home / Self Care | Payer: Medicare Other | Source: Ambulatory Visit | Attending: Pain Medicine | Admitting: Pain Medicine

## 2018-03-27 ENCOUNTER — Other Ambulatory Visit: Payer: Self-pay

## 2018-03-27 ENCOUNTER — Ambulatory Visit (HOSPITAL_BASED_OUTPATIENT_CLINIC_OR_DEPARTMENT_OTHER): Payer: Medicare Other | Admitting: Pain Medicine

## 2018-03-27 ENCOUNTER — Encounter: Payer: Self-pay | Admitting: Pain Medicine

## 2018-03-27 VITALS — BP 147/76 | HR 42 | Temp 97.3°F | Resp 16 | Ht 59.0 in | Wt 115.0 lb

## 2018-03-27 DIAGNOSIS — M47816 Spondylosis without myelopathy or radiculopathy, lumbar region: Secondary | ICD-10-CM

## 2018-03-27 DIAGNOSIS — Z7901 Long term (current) use of anticoagulants: Secondary | ICD-10-CM

## 2018-03-27 DIAGNOSIS — Z881 Allergy status to other antibiotic agents status: Secondary | ICD-10-CM | POA: Diagnosis not present

## 2018-03-27 DIAGNOSIS — M5388 Other specified dorsopathies, sacral and sacrococcygeal region: Secondary | ICD-10-CM | POA: Diagnosis not present

## 2018-03-27 DIAGNOSIS — M533 Sacrococcygeal disorders, not elsewhere classified: Secondary | ICD-10-CM | POA: Diagnosis not present

## 2018-03-27 DIAGNOSIS — G8929 Other chronic pain: Secondary | ICD-10-CM | POA: Diagnosis not present

## 2018-03-27 DIAGNOSIS — F419 Anxiety disorder, unspecified: Secondary | ICD-10-CM | POA: Insufficient documentation

## 2018-03-27 DIAGNOSIS — Z79899 Other long term (current) drug therapy: Secondary | ICD-10-CM | POA: Diagnosis not present

## 2018-03-27 DIAGNOSIS — Z91040 Latex allergy status: Secondary | ICD-10-CM | POA: Diagnosis not present

## 2018-03-27 DIAGNOSIS — Z9049 Acquired absence of other specified parts of digestive tract: Secondary | ICD-10-CM | POA: Diagnosis not present

## 2018-03-27 DIAGNOSIS — Z90710 Acquired absence of both cervix and uterus: Secondary | ICD-10-CM | POA: Insufficient documentation

## 2018-03-27 DIAGNOSIS — M47817 Spondylosis without myelopathy or radiculopathy, lumbosacral region: Secondary | ICD-10-CM

## 2018-03-27 DIAGNOSIS — M545 Low back pain: Secondary | ICD-10-CM

## 2018-03-27 DIAGNOSIS — Z885 Allergy status to narcotic agent status: Secondary | ICD-10-CM | POA: Insufficient documentation

## 2018-03-27 DIAGNOSIS — M1288 Other specific arthropathies, not elsewhere classified, other specified site: Secondary | ICD-10-CM | POA: Diagnosis not present

## 2018-03-27 DIAGNOSIS — Z9849 Cataract extraction status, unspecified eye: Secondary | ICD-10-CM | POA: Insufficient documentation

## 2018-03-27 DIAGNOSIS — Z79890 Hormone replacement therapy (postmenopausal): Secondary | ICD-10-CM | POA: Insufficient documentation

## 2018-03-27 DIAGNOSIS — G894 Chronic pain syndrome: Secondary | ICD-10-CM

## 2018-03-27 MED ORDER — MIDAZOLAM HCL 5 MG/5ML IJ SOLN
1.0000 mg | INTRAMUSCULAR | Status: DC | PRN
  Administered 2018-03-27: 1 mg via INTRAVENOUS
  Filled 2018-03-27: qty 5

## 2018-03-27 MED ORDER — LACTATED RINGERS IV SOLN
1000.0000 mL | Freq: Once | INTRAVENOUS | Status: AC
  Administered 2018-03-27: 1000 mL via INTRAVENOUS

## 2018-03-27 MED ORDER — LIDOCAINE HCL 2 % IJ SOLN
INTRAMUSCULAR | Status: AC
  Filled 2018-03-27: qty 20

## 2018-03-27 MED ORDER — TRIAMCINOLONE ACETONIDE 40 MG/ML IJ SUSP
40.0000 mg | Freq: Once | INTRAMUSCULAR | Status: AC
  Administered 2018-03-27: 40 mg
  Filled 2018-03-27: qty 1

## 2018-03-27 MED ORDER — ROPIVACAINE HCL 2 MG/ML IJ SOLN
9.0000 mL | Freq: Once | INTRAMUSCULAR | Status: AC
  Administered 2018-03-27: 9 mL via PERINEURAL
  Filled 2018-03-27: qty 10

## 2018-03-27 MED ORDER — TRAMADOL HCL 50 MG PO TABS: 50.0000 mg | ORAL_TABLET | Freq: Four times a day (QID) | ORAL | 5 refills | Status: DC | PRN

## 2018-03-27 MED ORDER — LIDOCAINE HCL 2 % IJ SOLN
20.0000 mL | Freq: Once | INTRAMUSCULAR | Status: AC
  Administered 2018-03-27: 400 mg
  Filled 2018-03-27: qty 40

## 2018-03-27 MED ORDER — FENTANYL CITRATE (PF) 100 MCG/2ML IJ SOLN
25.0000 ug | INTRAMUSCULAR | Status: DC | PRN
  Administered 2018-03-27: 50 ug via INTRAVENOUS
  Filled 2018-03-27: qty 2

## 2018-03-27 NOTE — Progress Notes (Signed)
Safety precautions to be maintained throughout the outpatient stay will include: orient to surroundings, keep bed in low position, maintain call bell within reach at all times, provide assistance with transfer out of bed and ambulation.  

## 2018-03-27 NOTE — Progress Notes (Signed)
Patient's Name: Amanda Soto  MRN: [id number]  Referring Provider: Milinda Pointer, MD  DOB: 30-May-1925  PCP: [name], MD  DOS: 03/27/2018  Note by: Gaspar Cola, MD  Service setting: Ambulatory outpatient  Specialty: Interventional Pain Management  Patient type: Established  Location: [facility] (AMB) Pain Management Facility  Visit type: Interventional Procedure   Primary Reason for Visit: Interventional Pain Management Treatment. CC: Back Pain (low)  Procedure:          Anesthesia, Analgesia, Anxiolysis:  Type: Thermal Lumbar Facet, Medial Branch & Sacroiliac joint Radiofrequency Ablation  #2 (last on 12/17/2016) Region: Lumbosacral Level: L2, L3, L4, L5, S1, S2, & S3 Medial Branch Level(s). These levels will denervate the L3-4, L4-5, and the L5-S1 lumbar facet joints, as well as the posterior Sacroiliac Joint innervation. Primary Purpose: Therapeutic Region: Posterolateral Lumbosacral Spine Laterality: Right  Type: Moderate (Conscious) Sedation combined with Local Anesthesia Indication(s): Analgesia and Anxiety Route: Intravenous (IV) IV Access: Secured Sedation: Meaningful verbal contact was maintained at all times during the procedure  Local Anesthetic: Lidocaine 1-2%   Indications: 1. Spondylosis without myelopathy or radiculopathy, lumbosacral region   2. Lumbar facet arthropathy   3. Lumbar facet syndrome (Right)   4. Other specified dorsopathies, sacral and sacrococcygeal region   5. Chronic sacroiliac joint pain (Right)   6. Chronic low back pain (Primary Source of Pain) (Right)    [name] has been dealing with the above chronic pain for longer than three months and has either failed to respond, was unable to tolerate, or simply did not get enough benefit from other more conservative therapies including, but not limited to: 1. Over-the-counter medications 2. Anti-inflammatory medications 3. Muscle relaxants 4. Membrane stabilizers 5. Opioids 6. Physical  therapy and/or chiropractic manipulation 7. Modalities (Heat, ice, etc.) 8. Invasive techniques such as nerve blocks. [name] has attained more than 50% relief of the pain from a series of diagnostic injections conducted in separate occasions.  Pain Score: Pre-procedure: 10-Worst pain ever/10 Post-procedure: 0-No pain/10  Pre-op Assessment:  Amanda Soto is a 84 y.o. (year old), female patient, seen today for interventional treatment. She  has a past surgical history that includes Cataract extraction; Partial hysterectomy; Appendectomy; and Tonsillectomy. Amanda Soto has a current medication list which includes the following prescription(s): acetaminophen, apixaban, cyanocobalamin, furosemide, furosemide, multivitamin, ocuvite eye health formula, sotalol, synthroid, and tramadol, and the following Facility-Administered Medications: fentanyl and midazolam. Her primarily concern today is the Back Pain (low)  Initial Vital Signs:  Pulse/HCG Rate: (!) 49ECG Heart Rate: (!) 46 Temp: 98.3 F (36.8 C) Resp: 18 BP: (!) 150/56 SpO2: 99 %  BMI: Estimated body mass index is 23.23 kg/m as calculated from the following:   Height as of this encounter: 4\' 11"  (1.499 m).   Weight as of this encounter: 115 lb (52.2 kg).  Risk Assessment: Allergies: Reviewed. She is allergic to hydrocodone; ciprofloxacin; and latex.  Allergy Precautions: Latex-free protocol activated Coagulopathies: Reviewed. None identified.  Blood-thinner therapy: None at this time Active Infection(s): Reviewed. None identified. Amanda Soto is afebrile  Site Confirmation: Amanda Soto was asked to confirm the procedure and laterality before marking the site Procedure checklist: Completed Consent: Before the procedure and under the influence of no sedative(s), amnesic(s), or anxiolytics, the patient was informed of the treatment options, risks and possible complications. To fulfill our ethical and legal obligations, as recommended by the  American Medical Association's Code of Ethics, I have informed the patient of my clinical impression; the  nature and purpose of the treatment or procedure; the risks, benefits, and possible complications of the intervention; the alternatives, including doing nothing; the risk(s) and benefit(s) of the alternative treatment(s) or procedure(s); and the risk(s) and benefit(s) of doing nothing. The patient was provided information about the general risks and possible complications associated with the procedure. These may include, but are not limited to: failure to achieve desired goals, infection, bleeding, organ or nerve damage, allergic reactions, paralysis, and death. In addition, the patient was informed of those risks and complications associated to Spine-related procedures, such as failure to decrease pain; infection (i.e.: Meningitis, epidural or intraspinal abscess); bleeding (i.e.: epidural hematoma, subarachnoid hemorrhage, or any other type of intraspinal or peri-dural bleeding); organ or nerve damage (i.e.: Any type of peripheral nerve, nerve root, or spinal cord injury) with subsequent damage to sensory, motor, and/or autonomic systems, resulting in permanent pain, numbness, and/or weakness of one or several areas of the body; allergic reactions; (i.e.: anaphylactic reaction); and/or death. Furthermore, the patient was informed of those risks and complications associated with the medications. These include, but are not limited to: allergic reactions (i.e.: anaphylactic or anaphylactoid reaction(s)); adrenal axis suppression; blood sugar elevation that in diabetics may result in ketoacidosis or comma; water retention that in patients with history of congestive heart failure may result in shortness of breath, pulmonary edema, and decompensation with resultant heart failure; weight gain; swelling or edema; medication-induced neural toxicity; particulate matter embolism and blood vessel occlusion with  resultant organ, and/or nervous system infarction; and/or aseptic necrosis of one or more joints. Finally, the patient was informed that Medicine is not an exact science; therefore, there is also the possibility of unforeseen or unpredictable risks and/or possible complications that may result in a catastrophic outcome. The patient indicated having understood very clearly. We have given the patient no guarantees and we have made no promises. Enough time was given to the patient to ask questions, all of which were answered to the patient's satisfaction. Ms. Bordenave has indicated that she wanted to continue with the procedure. Attestation: I, the ordering provider, attest that I have discussed with the patient the benefits, risks, side-effects, alternatives, likelihood of achieving goals, and potential problems during recovery for the procedure that I have provided informed consent. Date  Time: 03/27/2018 12:06 PM  Pre-Procedure Preparation:  Monitoring: As per clinic protocol. Respiration, ETCO2, SpO2, BP, heart rate and rhythm monitor placed and checked for adequate function Safety Precautions: Patient was assessed for positional comfort and pressure points before starting the procedure. Time-out: I initiated and conducted the "Time-out" before starting the procedure, as per protocol. The patient was asked to participate by confirming the accuracy of the "Time Out" information. Verification of the correct person, site, and procedure were performed and confirmed by me, the nursing staff, and the patient. "Time-out" conducted as per Joint Commission's Universal Protocol (UP.01.01.01). Time: 1235  Description of Procedure:          Position: Prone Laterality: Right Level: L2, L3, L4, L5, S1, S2, & S3 Medial Branch Level(s), at the L3-4, L4-5, and the L5-S1 lumbar facet joints, as well as the posterior Sacroiliac Joint. Area Prepped: Lumbosacral Prepping solution: ChloraPrep (2% chlorhexidine gluconate and  70% isopropyl alcohol) Safety Precautions: Aspiration looking for blood return was conducted prior to all injections. At no point did we inject any substances, as a needle was being advanced. Before injecting, the patient was told to immediately notify me if she was experiencing any new onset of "ringing  in the ears, or metallic taste in the mouth". No attempts were made at seeking any paresthesias. Safe injection practices and needle disposal techniques used. Medications properly checked for expiration dates. SDV (single dose vial) medications used. After the completion of the procedure, all disposable equipment used was discarded in the proper designated medical waste containers. Local Anesthesia: Protocol guidelines were followed. The patient was positioned over the fluoroscopy table. The area was prepped in the usual manner. The time-out was completed. The target area was identified using fluoroscopy. A 12-in long, straight, sterile hemostat was used with fluoroscopic guidance to locate the targets for each level blocked. Once located, the skin was marked with an approved surgical skin marker. Once all sites were marked, the skin (epidermis, dermis, and hypodermis), as well as deeper tissues (fat, connective tissue and muscle) were infiltrated with a small amount of a short-acting local anesthetic, loaded on a 10cc syringe with a 25G, 1.5-in  Needle. An appropriate amount of time was allowed for local anesthetics to take effect before proceeding to the next step. Local Anesthetic: Lidocaine 2.0% The unused portion of the local anesthetic was discarded in the proper designated containers. Technical explanation of process:  Radiofrequency Ablation (RFA) L2 Medial Branch Nerve RFA: The target area for the L2 medial branch is at the junction of the postero-lateral aspect of the superior articular process and the superior, posterior, and medial edge of the transverse process of L3. Under fluoroscopic guidance,  a Radiofrequency needle was inserted until contact was made with os over the superior postero-lateral aspect of the pedicular shadow (target area). Sensory and motor testing was conducted to properly adjust the position of the needle. Once satisfactory placement of the needle was achieved, the numbing solution was slowly injected after negative aspiration for blood. 2.0 mL of the nerve block solution was injected without difficulty or complication. After waiting for at least 3 minutes, the ablation was performed. Once completed, the needle was removed intact. L3 Medial Branch Nerve RFA: The target area for the L3 medial branch is at the junction of the postero-lateral aspect of the superior articular process and the superior, posterior, and medial edge of the transverse process of L4. Under fluoroscopic guidance, a Radiofrequency needle was inserted until contact was made with os over the superior postero-lateral aspect of the pedicular shadow (target area). Sensory and motor testing was conducted to properly adjust the position of the needle. Once satisfactory placement of the needle was achieved, the numbing solution was slowly injected after negative aspiration for blood. 2.0 mL of the nerve block solution was injected without difficulty or complication. After waiting for at least 3 minutes, the ablation was performed. Once completed, the needle was removed intact. L4 Medial Branch Nerve RFA: The target area for the L4 medial branch is at the junction of the postero-lateral aspect of the superior articular process and the superior, posterior, and medial edge of the transverse process of L5. Under fluoroscopic guidance, a Radiofrequency needle was inserted until contact was made with os over the superior postero-lateral aspect of the pedicular shadow (target area). Sensory and motor testing was conducted to properly adjust the position of the needle. Once satisfactory placement of the needle was achieved, the  numbing solution was slowly injected after negative aspiration for blood. 2.0 mL of the nerve block solution was injected without difficulty or complication. After waiting for at least 3 minutes, the ablation was performed. Once completed, the needle was removed intact. L5 Medial Branch Nerve RFA: The  target area for the L5 medial branch is at the junction of the postero-lateral aspect of the superior articular process of S1 and the superior, posterior, and medial edge of the sacral ala. Under fluoroscopic guidance, a Radiofrequency needle was inserted until contact was made with os over the superior postero-lateral aspect of the pedicular shadow (target area). Sensory and motor testing was conducted to properly adjust the position of the needle. Once satisfactory placement of the needle was achieved, the numbing solution was slowly injected after negative aspiration for blood. 2.0 mL of the nerve block solution was injected without difficulty or complication. After waiting for at least 3 minutes, the ablation was performed. Once completed, the needle was removed intact. S1 Primary Dorsal Rami and Lateral Branch Nerve RFA: The target area for the S1 medial branch is located inferior to the junction of the S1 superior articular process and the L5 inferior articular process, posterior, inferior, and lateral to the 6 o'clock position of the L5-S1 facet joint, just superior to the S1 posterior foramen. Under fluoroscopic guidance, the Radiofrequency needle was advanced until contact was made with os over the Target area. Sensory and motor testing was conducted to properly adjust the position of the needle. Once satisfactory placement of the needle was achieved, the numbing solution was slowly injected after negative aspiration for blood. 2.0 mL of the nerve block solution was injected without difficulty or complication. After waiting for at least 3 minutes, the ablation was performed. Once completed, the needle was  removed intact. S2 Primary Dorsal Rami and Lateral Branch Nerve RFA: The target area for the S2 medial branch is at the posterior superior lateral of the S2 posterior neural foramen. Under fluoroscopic guidance, the Radiofrequency needle was advanced until contact was made with os over the Target area. Sensory and motor testing was conducted to properly adjust the position of the needle. Once satisfactory placement of the needle was achieved, the numbing solution was slowly injected after negative aspiration for blood. 2.0 mL of the nerve block solution was injected without difficulty or complication. After waiting for at least 3 minutes, the ablation was performed. Once completed, the needle was removed intact. S3 Primary Dorsal Rami and Lateral Branch Nerve RFA: The target area for the S3 medial branch is at the posterior superior lateral of the S3 posterior neural foramen. Under fluoroscopic guidance, the Radiofrequency needle was advanced until contact was made with os over the Target area. Sensory and motor testing was conducted to properly adjust the position of the needle. Once satisfactory placement of the needle was achieved, the numbing solution was slowly injected after negative aspiration for blood. 2.0 mL of the nerve block solution was injected without difficulty or complication. After waiting for at least 3 minutes, the ablation was performed. Once completed, the needle was removed intact. Radiofrequency lesioning (ablation):  Radiofrequency Generator: NeuroTherm [id number] Sensory Stimulation Parameters: 50 Hz was used to locate & identify the nerve, making sure that the needle was positioned such that there was no sensory stimulation below 0.3 V or above 0.7 V. Motor Stimulation Parameters: 2 Hz was used to evaluate the motor component. Care was taken not to lesion any nerves that demonstrated motor stimulation of the lower extremities at an output of less than 2.5 times that of the sensory  threshold, or a maximum of 2.0 V. Lesioning Technique Parameters: Standard Radiofrequency settings. (Not bipolar or pulsed.) Temperature Settings: 80 degrees C Lesioning time: 60 seconds Intra-operative Compliance: Compliant Materials & Medications: Needle(s) (Electrode/Cannula)  Type: Teflon-coated, curved tip, Radiofrequency needle(s) Gauge: 22G Length: 10cm Numbing solution: 0.2% PF-Ropivacaine + Triamcinolone (40 mg/mL) diluted to a final concentration of 4 mg of Triamcinolone/mL of Ropivacaine The unused portion of the solution was discarded in the proper designated containers.  Once the entire procedure was completed, the treated area was cleaned, making sure to leave some of the prepping solution back to take advantage of its long term bactericidal properties.    Illustration of the posterior view of the lumbar spine and the posterior neural structures. Laminae of L2 through S1 are labeled. DPRL5, dorsal primary ramus of L5; DPRS1, dorsal primary ramus of S1; DPR3, dorsal primary ramus of L3; FJ, facet (zygapophyseal) joint L3-L4; I, inferior articular process of L4; LB1, lateral branch of dorsal primary ramus of L1; IAB, inferior articular branches from L3 medial branch (supplies L4-L5 facet joint); IBP, intermediate branch plexus; MB3, medial branch of dorsal primary ramus of L3; NR3, third lumbar nerve root; S, superior articular process of L5; SAB, superior articular branches from L4 (supplies L4-5 facet joint also); TP3, transverse process of L3.  Vitals:   03/27/18 1320 03/27/18 1330 03/27/18 1340 03/27/18 1350  BP: (!) 128/48 (!) 117/38 133/64 (!) 147/76  Pulse: (!) 46 (!) 40 (!) 40 (!) 42  Resp: 16 12 12 16   Temp:  (!) 97.4 F (36.3 C)  (!) 97.3 F (36.3 C)  SpO2: 98% 99% 98% 100%  Weight:      Height:        Start Time: 1235 hrs. End Time: 1319 hrs.  Imaging Guidance (Spinal):          Type of Imaging Technique: Fluoroscopy Guidance (Spinal) Indication(s): Assistance  in needle guidance and placement for procedures requiring needle placement in or near specific anatomical locations not easily accessible without such assistance. Exposure Time: Please see nurses notes. Contrast: None used. Fluoroscopic Guidance: I was personally present during the use of fluoroscopy. "Tunnel Vision Technique" used to obtain the best possible view of the target area. Parallax error corrected before commencing the procedure. "Direction-depth-direction" technique used to introduce the needle under continuous pulsed fluoroscopy. Once target was reached, antero-posterior, oblique, and lateral fluoroscopic projection used confirm needle placement in all planes. Images permanently stored in EMR. Interpretation: No contrast injected. I personally interpreted the imaging intraoperatively. Adequate needle placement confirmed in multiple planes. Permanent images saved into the patient's record.  Antibiotic Prophylaxis:   Anti-infectives (From admission, onward)   None     Indication(s): None identified  Post-operative Assessment:  Post-procedure Vital Signs:  Pulse/HCG Rate: (!) 42(!) 46 Temp: (!) 97.3 F (36.3 C) Resp: 16 BP: (!) 147/76 SpO2: 100 %  EBL: None  Complications: No immediate post-treatment complications observed by team, or reported by patient.  Note: The patient tolerated the entire procedure well. A repeat set of vitals were taken after the procedure and the patient was kept under observation following institutional policy, for this type of procedure. Post-procedural neurological assessment was performed, showing return to baseline, prior to discharge. The patient was provided with post-procedure discharge instructions, including a section on how to identify potential problems. Should any problems arise concerning this procedure, the patient was given instructions to immediately contact us, at any time, without hesitation. In any case, we plan to contact the patient  by telephone for a follow-up status report regarding this interventional procedure.  Comments:  No additional relevant information.  Plan of Care   Imaging Orders     DG C-Arm 1-60  Min-No Report  Procedure Orders     Radiofrequency,Lumbar     Radiofrequency Sacroiliac Joint  Medications ordered for procedure: Meds ordered this encounter  Medications  . lidocaine (XYLOCAINE) 2 % (with pres) injection 400 mg  . midazolam (VERSED) 5 MG/5ML injection 1-2 mg    Make sure Flumazenil is available in the pyxis when using this medication. If oversedation occurs, administer 0.2 mg IV over 15 sec. If after 45 sec no response, administer 0.2 mg again over 1 min; may repeat at 1 min intervals; not to exceed 4 doses (1 mg)  . fentaNYL (SUBLIMAZE) injection 25-50 mcg    Make sure Narcan is available in the pyxis when using this medication. In the event of respiratory depression (RR< 8/min): Titrate NARCAN (naloxone) in increments of 0.1 to 0.2 mg IV at 2-3 minute intervals, until desired degree of reversal.  . lactated ringers infusion 1,000 mL  . ropivacaine (PF) 2 mg/mL (0.2%) (NAROPIN) injection 9 mL  . triamcinolone acetonide (KENALOG-40) injection 40 mg  . ropivacaine (PF) 2 mg/mL (0.2%) (NAROPIN) injection 9 mL  . triamcinolone acetonide (KENALOG-40) injection 40 mg  . traMADol (ULTRAM) 50 MG tablet    Sig: Take 1 tablet (50 mg total) by mouth every 6 (six) hours as needed for severe pain.    Dispense:  120 tablet    Refill:  5    Medication for Chronic Pain (G89.4). [place] STOP ACT - Not applicable. Fill one day early if pharmacy is closed on scheduled refill date. Do not fill until: 04/09/18. To last until: 10/06/2018   Medications administered: We administered lidocaine, midazolam, fentaNYL, lactated ringers, ropivacaine (PF) 2 mg/mL (0.2%), triamcinolone acetonide, ropivacaine (PF) 2 mg/mL (0.2%), and triamcinolone acetonide.  See the medical record for exact dosing, route, and time of  administration.  New Prescriptions   No medications on file   Disposition: Discharge home  Discharge Date & Time: 03/27/2018; 1355 hrs.   Physician-requested Follow-up: Return for Post-RFA eval (6 wks), w/ Dionisio David, NP.  Future Appointments  Date Time Provider [place]  04/21/2018  9:45 AM Gardiner Barefoot, DPM [facility]  05/08/2018  1:45 PM Vevelyn Francois, NP [facility] None  06/09/2018  1:45 PM [name], MD [facility]  06/11/2018 12:45 PM Vevelyn Francois, NP [facility] None  07/28/2018  1:00 PM O'Brien-Blaney, Bryson Corona, LPN [facility]   Primary Care Physician: [name], MD Location: [facility] Note by: Gaspar Cola, MD Date: 03/27/2018; Time: 2:36 PM  Disclaimer:  Medicine is not an Chief Strategy Officer. The only guarantee in medicine is that nothing is guaranteed. It is important to note that the decision to proceed with this intervention was based on the information collected from the patient. The Data and conclusions were drawn from the patient's questionnaire, the interview, and the physical examination. Because the information was provided in large part by the patient, it cannot be guaranteed that it has not been purposely or unconsciously manipulated. Every effort has been made to obtain as much relevant data as possible for this evaluation. It is important to note that the conclusions that lead to this procedure are derived in large part from the available data. Always take into account that the treatment will also be dependent on availability of resources and existing treatment guidelines, considered by other Pain Management Practitioners as being common knowledge and practice, at the time of the intervention. For Medico-Legal purposes, it is also important to point out  that variation in procedural techniques and pharmacological choices are the acceptable norm. The indications, contraindications, technique, and  results of the above procedure should only be interpreted and judged by a Board-Certified Interventional Pain Specialist with extensive familiarity and expertise in the same exact procedure and technique.

## 2018-03-27 NOTE — Patient Instructions (Addendum)
___________________________________________________________________________________________  Post-Radiofrequency (RF) Discharge Instructions  You have just completed a Radiofrequency Neurotomy.  The following instructions will provide you with information and guidelines for self-care upon discharge.  If at any time you have questions or concerns please call your physician. DO NOT DRIVE YOURSELF!!  Instructions:  Apply ice: Fill a plastic sandwich bag with crushed ice. Cover it with a small towel and apply to injection site. Apply for 15 minutes then remove x 15 minutes. Repeat sequence on day of procedure, until you go to bed. The purpose is to minimize swelling and discomfort after procedure.  Apply heat: Apply heat to procedure site starting the day following the procedure. The purpose is to treat any soreness and discomfort from the procedure.  Food intake: No eating limitations, unless stipulated above.  Nevertheless, if you have had sedation, you may experience some nausea.  In this case, it may be wise to wait at least two hours prior to resuming regular diet.  Physical activities: Keep activities to a minimum for the first 8 hours after the procedure. For the first 24 hours after the procedure, do not drive a motor vehicle,  Operate heavy machinery, power tools, or handle any weapons.  Consider walking with the use of an assistive device or accompanied by an adult for the first 24 hours.  Do not drink alcoholic beverages including beer.  Do not make any important decisions or sign any legal documents. Go home and rest today.  Resume activities tomorrow, as tolerated.  Use caution in moving about as you may experience mild leg weakness.  Use caution in cooking, use of household electrical appliances and climbing steps.  Driving: If you have received any sedation, you are not allowed to drive for 24 hours after your procedure.  Blood thinner: Restart your blood thinner 6 hours after your  procedure. (Only for those taking blood thinners)  Insulin: As soon as you can eat, you may resume your normal dosing schedule. (Only for those taking insulin)  Medications: May resume pre-procedure medications.  Do not take any drugs, other than what has been prescribed to you.  Infection prevention: Keep procedure site clean and dry.  Post-procedure Pain Diary: Extremely important that this be done correctly and accurately. Recorded information will be used to determine the next step in treatment.  Pain evaluated is that of treated area only. Do not include pain from an untreated area.  Complete every hour, on the hour, for the initial 8 hours. Set an alarm to help you do this part accurately.  Do not go to sleep and have it completed later. It will not be accurate.  Follow-up appointment: Keep your follow-up appointment after the procedure. Usually 2 weeks for most procedures. (6 weeks in the case of radiofrequency.) Bring you pain diary.   Expect:  From numbing medicine (AKA: Local Anesthetics): Numbness or decrease in pain.  Onset: Full effect within 15 minutes of injected.  Duration: It will depend on the type of local anesthetic used. On the average, 1 to 8 hours.   From steroids: Decrease in swelling or inflammation. Once inflammation is improved, relief of the pain will follow.  Onset of benefits: Depends on the amount of swelling present. The more swelling, the longer it will take for the benefits to be seen. In some cases, up to 10 days.  Duration: Steroids will stay in the system x 2 weeks. Duration of benefits will depend on multiple posibilities including persistent irritating factors.  From procedure: Some   discomfort is to be expected once the numbing medicine wears off. This should be minimal if ice and heat are applied as instructed.  Call if:  You experience numbness and weakness that gets worse with time, as opposed to wearing off.  He experience any unusual  bleeding, difficulty breathing, or loss of the ability to control your bowel and bladder. (This applies to Spinal procedures only)  You experience any redness, swelling, heat, red streaks, elevated temperature, fever, or any other signs of a possible infection.  Emergency Numbers:  [place] hours (Monday - Thursday, 8:00 AM - 4:00 PM) (Friday, 9:00 AM - 12:00 Noon): [phone]  After hours: [phone] ____________________________________________________________________________________________   ____________________________________________________________________________________________  Pain Scale  Introduction: The pain score used by this practice is the Verbal Numerical Rating Scale (VNRS-11). This is an 11-point scale. It is for adults and children 10 years or older. There are significant differences in how the pain score is reported, used, and applied. Forget everything you learned in the past and learn this scoring system.  General Information: The scale should reflect your current level of pain. Unless you are specifically asked for the level of your worst pain, or your average pain. If you are asked for one of these two, then it should be understood that it is over the past 24 hours.  Basic Activities of Daily Living (ADL): Personal hygiene, dressing, eating, transferring, and using restroom.  Instructions: Most patients tend to report their level of pain as a combination of two factors, their physical pain and their psychosocial pain. This last one is also known as "suffering" and it is reflection of how physical pain affects you socially and psychologically. From now on, report them separately. From this point on, when asked to report your pain level, report only your physical pain. Use the following table for reference.  Pain Clinic Pain Levels (0-5/10)  Pain Level Score  Description  No Pain 0   Mild pain 1 Nagging, annoying, but does not interfere with basic  activities of daily living (ADL). Patients are able to eat, bathe, get dressed, toileting (being able to get on and off the toilet and perform personal hygiene functions), transfer (move in and out of bed or a chair without assistance), and maintain continence (able to control bladder and bowel functions). Blood pressure and heart rate are unaffected. A normal heart rate for a healthy adult ranges from 60 to 100 bpm (beats per minute).   Mild to moderate pain 2 Noticeable and distracting. Impossible to hide from other people. More frequent flare-ups. Still possible to adapt and function close to normal. It can be very annoying and may have occasional stronger flare-ups. With discipline, patients may get used to it and adapt.   Moderate pain 3 Interferes significantly with activities of daily living (ADL). It becomes difficult to feed, bathe, get dressed, get on and off the toilet or to perform personal hygiene functions. Difficult to get in and out of bed or a chair without assistance. Very distracting. With effort, it can be ignored when deeply involved in activities.   Moderately severe pain 4 Impossible to ignore for more than a few minutes. With effort, patients may still be able to manage work or participate in some social activities. Very difficult to concentrate. Signs of autonomic nervous system discharge are evident: dilated pupils (mydriasis); mild sweating (diaphoresis); sleep interference. Heart rate becomes elevated (>115 bpm). Diastolic blood pressure (lower number) rises above 100 mmHg. Patients find relief in  laying down and not moving.   Severe pain 5 Intense and extremely unpleasant. Associated with frowning face and frequent crying. Pain overwhelms the senses.  Ability to do any activity or maintain social relationships becomes significantly limited. Conversation becomes difficult. Pacing back and forth is common, as getting into a comfortable position is nearly impossible. Pain wakes you  up from deep sleep. Physical signs will be obvious: pupillary dilation; increased sweating; goosebumps; brisk reflexes; cold, clammy hands and feet; nausea, vomiting or dry heaves; loss of appetite; significant sleep disturbance with inability to fall asleep or to remain asleep. When persistent, significant weight loss is observed due to the complete loss of appetite and sleep deprivation.  Blood pressure and heart rate becomes significantly elevated. Caution: If elevated blood pressure triggers a pounding headache associated with blurred vision, then the patient should immediately seek attention at an urgent or emergency care unit, as these may be signs of an impending stroke.    Emergency Department Pain Levels (6-10/10)  Emergency Room Pain 6 Severely limiting. Requires emergency care and should not be seen or managed at an outpatient pain management facility. Communication becomes difficult and requires great effort. Assistance to reach the emergency department may be required. Facial flushing and profuse sweating along with potentially dangerous increases in heart rate and blood pressure will be evident.   Distressing pain 7 Self-care is very difficult. Assistance is required to transport, or use restroom. Assistance to reach the emergency department will be required. Tasks requiring coordination, such as bathing and getting dressed become very difficult.   Disabling pain 8 Self-care is no longer possible. At this level, pain is disabling. The individual is unable to do even the most "basic" activities such as walking, eating, bathing, dressing, transferring to a bed, or toileting. Fine motor skills are lost. It is difficult to think clearly.   Incapacitating pain 9 Pain becomes incapacitating. Thought processing is no longer possible. Difficult to remember your own name. Control of movement and coordination are lost.   The worst pain imaginable 10 At this level, most patients pass out from pain.  When this level is reached, collapse of the autonomic nervous system occurs, leading to a sudden drop in blood pressure and heart rate. This in turn results in a temporary and dramatic drop in blood flow to the brain, leading to a loss of consciousness. Fainting is one of the body's self defense mechanisms. Passing out puts the brain in a calmed state and causes it to shut down for a while, in order to begin the healing process.    Summary: 1. Refer to this scale when providing [place] with your pain level. 2. Be accurate and careful when reporting your pain level. This will help with your care. 3. Over-reporting your pain level will lead to loss of credibility. 4. Even a level of 1/10 means that there is pain and will be treated at our facility. 5. High, inaccurate reporting will be documented as "Symptom Exaggeration", leading to loss of credibility and suspicions of possible secondary gains such as obtaining more narcotics, or wanting to appear disabled, for fraudulent reasons. 6. Only pain levels of 5 or below will be seen at our facility. 7. Pain levels of 6 and above will be sent to the Emergency Department and the appointment cancelled. ____________________________________________________________________________________________  Tramadol was sent to your pharmacy.

## 2018-03-28 ENCOUNTER — Telehealth: Payer: Self-pay

## 2018-03-28 NOTE — Telephone Encounter (Signed)
Post procedure phone call. Patient states she is doing good.  

## 2018-03-29 ENCOUNTER — Other Ambulatory Visit: Payer: Self-pay | Admitting: Family Medicine

## 2018-03-31 ENCOUNTER — Telehealth: Payer: Self-pay | Admitting: Family Medicine

## 2018-03-31 NOTE — Telephone Encounter (Signed)
She should be evaluated in the office to determine appropriate work up. She can be scheduled with me during an available same day visit or with Lauren.

## 2018-03-31 NOTE — Telephone Encounter (Signed)
Sent to PCP for approval if OK place orders and I will call patient to schedule them for a lab appt.   Thanks

## 2018-03-31 NOTE — Telephone Encounter (Signed)
Copied from [place] [id number]. Topic: Quick Communication - See Telephone Encounter >> Mar 31, 2018  1:47 PM Blase Mess A wrote: CRM for notification. See Telephone encounter for: 03/31/18. Patient is calling because she is feeling very weak for months. She wants to know if she should get her hemoglobin checked Patient call back number [phone]

## 2018-03-31 NOTE — Telephone Encounter (Signed)
Lab request

## 2018-04-01 NOTE — Telephone Encounter (Signed)
Patient scheduled.

## 2018-04-01 NOTE — Telephone Encounter (Signed)
Called and spoke with pt. Pt advised and voiced understanding. I am unable to schedule pt for same day appt.   Pt is aware an should be scheduled for 04/08/2018 @ 3:20 PM   Please send this back to me once done  Thanks

## 2018-04-02 NOTE — Telephone Encounter (Signed)
Sent to pharmacy 

## 2018-04-02 NOTE — Telephone Encounter (Signed)
Last OV  02/20/2018   Last refilled 03/19/2017 disp 180 with 3 refills   Sent to PCP for approval

## 2018-04-08 ENCOUNTER — Encounter: Payer: Self-pay | Admitting: Family Medicine

## 2018-04-08 ENCOUNTER — Other Ambulatory Visit
Admission: RE | Admit: 2018-04-08 | Discharge: 2018-04-08 | Disposition: A | Discharge status: Home / Self Care | Payer: Medicare Other | Source: Ambulatory Visit | Attending: Family Medicine | Admitting: Family Medicine

## 2018-04-08 ENCOUNTER — Ambulatory Visit (INDEPENDENT_AMBULATORY_CARE_PROVIDER_SITE_OTHER): Payer: Medicare Other | Admitting: Family Medicine

## 2018-04-08 VITALS — BP 130/68 | HR 45 | Temp 98.1°F | Ht 59.0 in | Wt 115.6 lb

## 2018-04-08 DIAGNOSIS — R5382 Chronic fatigue, unspecified: Secondary | ICD-10-CM

## 2018-04-08 DIAGNOSIS — K921 Melena: Secondary | ICD-10-CM

## 2018-04-08 DIAGNOSIS — G44209 Tension-type headache, unspecified, not intractable: Secondary | ICD-10-CM

## 2018-04-08 LAB — CBC
HCT: 42.4 % (ref 36.0–46.0)
Hemoglobin: 14 g/dL (ref 12.0–15.0)
MCH: 32.7 pg (ref 26.0–34.0)
MCHC: 33 g/dL (ref 30.0–36.0)
MCV: 99.1 fL (ref 80.0–100.0)
NRBC: 0 % (ref 0.0–0.2)
PLATELETS: 210 10*3/uL (ref 150–400)
RBC: 4.28 MIL/uL (ref 3.87–5.11)
RDW: 13 % (ref 11.5–15.5)
WBC: 8.7 10*3/uL (ref 4.0–10.5)

## 2018-04-08 NOTE — Progress Notes (Signed)
  [name], MD Phone: [phone]  Amanda Soto is a 84 y.o. female who presents today for same-day visit.  Fatigue: Patient notes chronic fatigue going back years.  That has not necessarily worsened.  It has been evaluated with lab work previously.  On one occasion she was found to be quite anemic several years ago and she was admitted to the hospital.  She does note over the last month she has had black stools that alternate with yellow stools.  She notes no bright red blood in her stool.  No chest pain, shortness of breath, palpitations, fevers.  Occasional frontal headaches though no vision changes, numbness, weakness, or congestion.  Notes a history of headaches in the distant past though none recently.  Very rarely has abdominal discomfort on the right side.  Notes occasional lightheadedness.   Social History   Tobacco Use  Smoking Status Never Smoker  Smokeless Tobacco Never Used     ROS see history of present illness  Objective  Physical Exam Vitals:   04/08/18 1529  BP: 130/68  Pulse: (!) 45  Temp: 98.1 F (36.7 C)  SpO2: 96%    BP Readings from Last 3 Encounters:  04/08/18 130/68  03/27/18 (!) 147/76  02/20/18 124/66   Wt Readings from Last 3 Encounters:  04/08/18 115 lb 9.6 oz (52.4 kg)  03/27/18 115 lb (52.2 kg)  02/20/18 116 lb (52.6 kg)    Physical Exam  Constitutional: No distress.  Eyes:  Conjunctive appear normal, no pallor  Cardiovascular: Regular rhythm and normal heart sounds. Bradycardia present.  Pulmonary/Chest: Effort normal and breath sounds normal.  Abdominal: Soft. She exhibits no distension. There is no tenderness.  Genitourinary: Rectal exam shows guaiac negative stool.  Genitourinary Comments: Chaperone used, no rectal abnormalities noted, negative guaiac  Musculoskeletal: She exhibits no edema.  Neurological: She is alert.  CN 2-12 intact, 5/5 strength in bilateral biceps, triceps, grip, quads, hamstrings, plantar and  dorsiflexion, sensation to light touch intact in bilateral UE and LE  Skin: Skin is warm and dry. She is not diaphoretic.     Assessment/Plan: Please see individual problem list.  Chronic fatigue Continues to have issues with this.  Suspect is multifactorial in cause.  Recent symptoms with dark stools would be concerning for a GI bleed though guaiac was negative.  We will check a CBC.  If significantly anemic we will send to the emergency department.  If not anemic consider GI evaluation.  Melena Negative guaiac.  Undetermined if this is true melena or just a change in stool color.  We will check a CBC and then consider GI referral.  Headache Mild.  Neurologically intact.  If persists would consider further evaluation.   Orders Placed This Encounter  Procedures  . CBC    Standing Status:   Future    Number of Occurrences:   1    Standing Expiration Date:   04/09/2019    No orders of the defined types were placed in this encounter.    [name], MD [place]

## 2018-04-08 NOTE — Patient Instructions (Signed)
Nice to see you. We will have you go to the lab at the hospital to check your hemoglobin.  If it is significantly low I will call you tonight to have you go to the emergency room. If you develop chest pain, shortness of breath, bright red blood per rectum, or abdominal pain please seek medical attention immediately.

## 2018-04-10 DIAGNOSIS — K921 Melena: Secondary | ICD-10-CM | POA: Insufficient documentation

## 2018-04-10 DIAGNOSIS — R519 Headache, unspecified: Secondary | ICD-10-CM | POA: Insufficient documentation

## 2018-04-10 DIAGNOSIS — R51 Headache: Secondary | ICD-10-CM

## 2018-04-10 NOTE — Assessment & Plan Note (Signed)
Continues to have issues with this.  Suspect is multifactorial in cause.  Recent symptoms with dark stools would be concerning for a GI bleed though guaiac was negative.  We will check a CBC.  If significantly anemic we will send to the emergency department.  If not anemic consider GI evaluation.

## 2018-04-10 NOTE — Assessment & Plan Note (Signed)
Mild.  Neurologically intact.  If persists would consider further evaluation.

## 2018-04-10 NOTE — Assessment & Plan Note (Signed)
Negative guaiac.  Undetermined if this is true melena or just a change in stool color.  We will check a CBC and then consider GI referral.

## 2018-04-15 ENCOUNTER — Ambulatory Visit: Payer: Medicare Other | Admitting: Pain Medicine

## 2018-04-17 ENCOUNTER — Other Ambulatory Visit: Payer: Self-pay | Admitting: Family Medicine

## 2018-04-17 DIAGNOSIS — K921 Melena: Secondary | ICD-10-CM

## 2018-04-21 ENCOUNTER — Ambulatory Visit: Payer: Medicare Other | Admitting: Podiatry

## 2018-04-21 ENCOUNTER — Encounter: Payer: Self-pay | Admitting: Podiatry

## 2018-04-21 DIAGNOSIS — M79609 Pain in unspecified limb: Principal | ICD-10-CM

## 2018-04-21 DIAGNOSIS — B351 Tinea unguium: Secondary | ICD-10-CM | POA: Diagnosis not present

## 2018-04-21 DIAGNOSIS — D689 Coagulation defect, unspecified: Secondary | ICD-10-CM | POA: Diagnosis not present

## 2018-04-21 DIAGNOSIS — M79676 Pain in unspecified toe(s): Secondary | ICD-10-CM | POA: Diagnosis not present

## 2018-04-21 NOTE — Progress Notes (Signed)
Complaint:  Visit Type: Patient returns to my office for continued preventative foot care services. Complaint: Patient states" my nails have grown long and thick and become painful to walk and wear shoes" . The patient presents for preventative foot care services. No changes to ROS.  Patient is taking eliquiss.  Podiatric Exam: Vascular: dorsalis pedis and posterior tibial pulses are palpable bilateral. Capillary return is immediate. Temperature gradient is WNL. Skin turgor WNL  Sensorium: Normal Semmes Weinstein monofilament test. Normal tactile sensation bilaterally. Nail Exam: Pt has thick disfigured discolored nails with subungual debris noted bilateral entire nail hallux through fifth toenails Ulcer Exam: There is no evidence of ulcer or pre-ulcerative changes or infection. Orthopedic Exam: Muscle tone and strength are WNL. No limitations in general ROM. No crepitus or effusions noted. Foot type and digits show no abnormalities. Bony prominences are unremarkable. Skin: No Porokeratosis. No infection or ulcers  Diagnosis:  Onychomycosis, , Pain in right toe, pain in left toes  Treatment & Plan Procedures and Treatment: Consent by patient was obtained for treatment procedures.   Debridement of mycotic and hypertrophic toenails, 1 through 5 bilateral and clearing of subungual debris. No ulceration, no infection noted.  Return Visit-Office Procedure: Patient instructed to return to the office for a follow up visit 3 months for continued evaluation and treatment.    Jules Vidovich DPM 

## 2018-04-25 NOTE — Progress Notes (Signed)
Called and spoke with patient. Pt stated that "NO" she has NOT had anymore headaches. Pt has an appt set up with GI doctor on 05/16/2018. Sent to PCP as an [place].

## 2018-05-06 ENCOUNTER — Encounter: Payer: Self-pay | Admitting: Nurse Practitioner

## 2018-05-06 ENCOUNTER — Ambulatory Visit: Payer: Medicare Other | Attending: Nurse Practitioner | Admitting: Nurse Practitioner

## 2018-05-06 VITALS — BP 154/49 | HR 46 | Temp 97.6°F | Resp 16 | Ht 59.0 in | Wt 113.0 lb

## 2018-05-06 DIAGNOSIS — I5022 Chronic systolic (congestive) heart failure: Secondary | ICD-10-CM | POA: Insufficient documentation

## 2018-05-06 DIAGNOSIS — E785 Hyperlipidemia, unspecified: Secondary | ICD-10-CM | POA: Diagnosis not present

## 2018-05-06 DIAGNOSIS — Z885 Allergy status to narcotic agent status: Secondary | ICD-10-CM | POA: Diagnosis not present

## 2018-05-06 DIAGNOSIS — M419 Scoliosis, unspecified: Secondary | ICD-10-CM | POA: Diagnosis not present

## 2018-05-06 DIAGNOSIS — Z7901 Long term (current) use of anticoagulants: Secondary | ICD-10-CM | POA: Diagnosis not present

## 2018-05-06 DIAGNOSIS — Z881 Allergy status to other antibiotic agents status: Secondary | ICD-10-CM | POA: Diagnosis not present

## 2018-05-06 DIAGNOSIS — F419 Anxiety disorder, unspecified: Secondary | ICD-10-CM | POA: Insufficient documentation

## 2018-05-06 DIAGNOSIS — F3341 Major depressive disorder, recurrent, in partial remission: Secondary | ICD-10-CM | POA: Diagnosis not present

## 2018-05-06 DIAGNOSIS — M4726 Other spondylosis with radiculopathy, lumbar region: Secondary | ICD-10-CM | POA: Insufficient documentation

## 2018-05-06 DIAGNOSIS — M810 Age-related osteoporosis without current pathological fracture: Secondary | ICD-10-CM | POA: Insufficient documentation

## 2018-05-06 DIAGNOSIS — D649 Anemia, unspecified: Secondary | ICD-10-CM | POA: Insufficient documentation

## 2018-05-06 DIAGNOSIS — E039 Hypothyroidism, unspecified: Secondary | ICD-10-CM | POA: Insufficient documentation

## 2018-05-06 DIAGNOSIS — Z85828 Personal history of other malignant neoplasm of skin: Secondary | ICD-10-CM | POA: Diagnosis not present

## 2018-05-06 DIAGNOSIS — K219 Gastro-esophageal reflux disease without esophagitis: Secondary | ICD-10-CM | POA: Diagnosis not present

## 2018-05-06 DIAGNOSIS — Z79899 Other long term (current) drug therapy: Secondary | ICD-10-CM | POA: Diagnosis not present

## 2018-05-06 DIAGNOSIS — I110 Hypertensive heart disease with heart failure: Secondary | ICD-10-CM | POA: Insufficient documentation

## 2018-05-06 DIAGNOSIS — H3530 Unspecified macular degeneration: Secondary | ICD-10-CM | POA: Diagnosis not present

## 2018-05-06 DIAGNOSIS — I4891 Unspecified atrial fibrillation: Secondary | ICD-10-CM | POA: Insufficient documentation

## 2018-05-06 DIAGNOSIS — Z8249 Family history of ischemic heart disease and other diseases of the circulatory system: Secondary | ICD-10-CM | POA: Insufficient documentation

## 2018-05-06 DIAGNOSIS — G894 Chronic pain syndrome: Secondary | ICD-10-CM | POA: Insufficient documentation

## 2018-05-06 DIAGNOSIS — M533 Sacrococcygeal disorders, not elsewhere classified: Secondary | ICD-10-CM | POA: Insufficient documentation

## 2018-05-06 DIAGNOSIS — M545 Low back pain: Secondary | ICD-10-CM | POA: Diagnosis present

## 2018-05-06 DIAGNOSIS — M5116 Intervertebral disc disorders with radiculopathy, lumbar region: Secondary | ICD-10-CM | POA: Insufficient documentation

## 2018-05-06 DIAGNOSIS — M47816 Spondylosis without myelopathy or radiculopathy, lumbar region: Secondary | ICD-10-CM | POA: Diagnosis not present

## 2018-05-06 MED ORDER — LIDOCAINE 5 % EX OINT: 1.0000 "application " | TOPICAL_OINTMENT | Freq: Four times a day (QID) | CUTANEOUS | 2 refills | Status: AC | PRN

## 2018-05-06 NOTE — Patient Instructions (Signed)
____________________________________________________________________________________________  Medication Rules  Applies to: All patients receiving prescriptions (written or electronic).  Pharmacy of record: Pharmacy where electronic prescriptions will be sent. If written prescriptions are taken to a different pharmacy, please inform the nursing staff. The pharmacy listed in the electronic medical record should be the one where you would like electronic prescriptions to be sent.  Prescription refills: Only during scheduled appointments. Applies to both, written and electronic prescriptions.  NOTE: The following applies primarily to controlled substances (Opioid* Pain Medications).   Patient's responsibilities: 1. Pain Pills: Bring all pain pills to every appointment (except for procedure appointments). 2. Pill Bottles: Bring pills in original pharmacy bottle. Always bring newest bottle. Bring bottle, even if empty. 3. Medication refills: You are responsible for knowing and keeping track of what medications you need refilled. The day before your appointment, write a list of all prescriptions that need to be refilled. Bring that list to your appointment and give it to the admitting nurse. Prescriptions will be written only during appointments. If you forget a medication, it will not be "Called in", "Faxed", or "electronically sent". You will need to get another appointment to get these prescribed. 4. Prescription Accuracy: You are responsible for carefully inspecting your prescriptions before leaving our office. Have the discharge nurse carefully go over each prescription with you, before taking them home. Make sure that your name is accurately spelled, that your address is correct. Check the name and dose of your medication to make sure it is accurate. Check the number of pills, and the written instructions to make sure they are clear and accurate. Make sure that you are given enough medication to last  until your next medication refill appointment. 5. Taking Medication: Take medication as prescribed. Never take more pills than instructed. Never take medication more frequently than prescribed. Taking less pills or less frequently is permitted and encouraged, when it comes to controlled substances (written prescriptions).  6. Inform other Doctors: Always inform, all of your healthcare providers, of all the medications you take. 7. Pain Medication from other Providers: You are not allowed to accept any additional pain medication from any other Doctor or Healthcare provider. There are two exceptions to this rule. (see below) In the event that you require additional pain medication, you are responsible for notifying us, as stated below. 8. Medication Agreement: You are responsible for carefully reading and following our Medication Agreement. This must be signed before receiving any prescriptions from our practice. Safely store a copy of your signed Agreement. Violations to the Agreement will result in no further prescriptions. (Additional copies of our Medication Agreement are available upon request.) 9. Laws, Rules, & Regulations: All patients are expected to follow all Federal and State Laws, Statutes, Rules, & Regulations. Ignorance of the Laws does not constitute a valid excuse. The use of any illegal substances is prohibited. 10. Adopted CDC guidelines & recommendations: Target dosing levels will be at or below 60 MME/day. Use of benzodiazepines** is not recommended.  Exceptions: There are only two exceptions to the rule of not receiving pain medications from other Healthcare Providers. 1. Exception #1 (Emergencies): In the event of an emergency (i.e.: accident requiring emergency care), you are allowed to receive additional pain medication. However, you are responsible for: As soon as you are able, call our office [phone], at any time of the day or night, and leave a message stating your name, the  date and nature of the emergency, and the name and dose of the medication   prescribed. In the event that your call is answered by a member of our staff, make sure to document and save the date, time, and the name of the person that took your information.  2. Exception #2 (Planned Surgery): In the event that you are scheduled by another doctor or dentist to have any type of surgery or procedure, you are allowed (for a period no longer than 30 days), to receive additional pain medication, for the acute post-op pain. However, in this case, you are responsible for picking up a copy of our "Post-op Pain Management for Surgeons" handout, and giving it to your surgeon or dentist. This document is available at our office, and does not require an appointment to obtain it. Simply go to our office during business hours (Monday-Thursday from 8:00 AM to 4:00 PM) (Friday 8:00 AM to 12:00 Noon) or if you have a scheduled appointment with us, prior to your surgery, and ask for it by name. In addition, you will need to provide us with your name, name of your surgeon, type of surgery, and date of procedure or surgery.  *Opioid medications include: morphine, codeine, oxycodone, oxymorphone, hydrocodone, hydromorphone, meperidine, tramadol, tapentadol, buprenorphine, fentanyl, methadone. **Benzodiazepine medications include: diazepam (Valium), alprazolam (Xanax), clonazepam (Klonopine), lorazepam (Ativan), clorazepate (Tranxene), chlordiazepoxide (Librium), estazolam (Prosom), oxazepam (Serax), temazepam (Restoril), triazolam (Halcion) (Last updated: 08/29/2017) ____________________________________________________________________________________________    

## 2018-05-06 NOTE — Progress Notes (Signed)
Patient's Name: Amanda Soto  MRN: [id number]  Referring Provider: [name], MD  DOB: 12/13/1924  PCP: [name], MD  DOS: 05/06/2018  Note by: Vevelyn Francois NP  Service setting: Ambulatory outpatient  Specialty: Interventional Pain Management  Location: [facility] (AMB) Pain Management Facility    Patient type: Established    Primary Reason(s) for Visit: Encounter for prescription drug management & post-procedure evaluation of chronic illness with mild to moderate exacerbation(Level of risk: moderate) CC: Back Pain (lower)  HPI  Amanda Soto is a 84 y.o. year old, female patient, who comes today for a post-procedure evaluation and medication management. She has Chronic systolic heart failure ([place]); Bradycardia; Atrial fibrillation ([place]); HTN (hypertension); Abdominal discomfort; Exertional shortness of breath; Elevated glucose; Hypothyroidism; Lightheadedness; Symptomatic anemia; Anemia; Anxiety; Degeneration of intervertebral disc of lumbar region; Recurrent major depressive disorder, in partial remission ([place]); Cardiac murmur; H/O neoplasm; HLD (hyperlipidemia); MI (mitral incompetence); OP (osteoporosis); Chronic low back pain (Primary Source of Pain) (Right); Lumbar facet syndrome (Right); Grade 1 Anterolisthesis of L4 over L5 (5 mm); Lumbar spondylosis; Scoliosis of lumbar spine (concave to right side); Lumbar facet arthropathy; Osteoarthritis of hip (Right); Thoracic paracentral T7-8 disc protrusion; Macular degeneration; Chronic pain syndrome; Chronic fatigue; Closed Colles' fracture; Acute postoperative pain; Dysuria; History of fall; Chronic sacroiliac joint pain (Right); DDD (degenerative disc disease), lumbar; Chronic anticoagulation (Eliquis); Actinic keratoses; Muscle strain; History of nonmelanoma skin cancer; Spondylosis without myelopathy or radiculopathy, lumbosacral region; Other specified dorsopathies, sacral and sacrococcygeal region; History of allergy to latex; Melena;  and Headache on their problem list. Her primarily concern today is the Back Pain (lower)  Pain Assessment: Location: Right, Lower Back Radiating: denies Onset: More than a month ago Duration: Chronic pain Quality: Aching, Sharp, Throbbing Severity: 4 /10 (subjective, self-reported pain score)  Note: Reported level is compatible with observation.                          Effect on ADL: "I cant do anything" Timing: Constant Modifying factors: rest, tramadol  BP: (!) 154/49  HR: (!) 46  [name] was last seen on 03/28/2018 for a procedure. During today's appointment we reviewed Ms. Shorey's post-procedure results, as well as her outpatient medication regimen.  Further details on both, my assessment(s), as well as the proposed treatment plan, please see below.  Controlled Substance Pharmacotherapy Assessment REMS (Risk Evaluation and Mitigation Strategy)  Analgesic:Tramadol 50 mg 1 tablet by mouth every 6 hoursprn. (239m/dayof tramadol) (20MME/day) MME/day:220mday GaIgnatius SpeckingRN  05/06/2018  2:19 PM  Sign at close encounter Safety precautions to be maintained throughout the outpatient stay will include: orient to surroundings, keep bed in low position, maintain call bell within reach at all times, provide assistance with transfer out of bed and ambulation.    Pharmacokinetics: Liberation and absorption (onset of action): WNL Distribution (time to peak effect): WNL Metabolism and excretion (duration of action): WNL         Pharmacodynamics: Desired effects: Analgesia: Ms. HuKozlowskieports >50% benefit. Functional ability: Patient reports that medication allows her to accomplish basic ADLs Clinically meaningful improvement in function (CMIF): Sustained CMIF goals met Perceived effectiveness: Described as relatively effective, allowing for increase in activities of daily living (ADL) Undesirable effects: Side-effects or Adverse reactions: None reported Monitoring:  PMP:  Online review of the past 1225-monthriod conducted. Compliant with practice rules and regulations Last UDS on record: No results found for: SUMMARY UDS interpretation:  Compliant          Medication Assessment Form: Reviewed. Patient indicates being compliant with therapy Treatment compliance: Compliant Risk Assessment Profile: Aberrant behavior: See prior evaluations. None observed or detected today Comorbid factors increasing risk of overdose: See prior notes. No additional risks detected today Opioid risk tool (ORT) (Total Score): 2 Personal History of Substance Abuse (SUD-Substance use disorder):  Alcohol: Negative  Illegal Drugs: Negative  Rx Drugs: Negative  ORT Risk Level calculation: Low Risk Risk of substance use disorder (SUD): Low Opioid Risk Tool - 05/06/18 1422      Family History of Substance Abuse   Alcohol  Negative    Illegal Drugs  Positive Female    Rx Drugs  Negative      Personal History of Substance Abuse   Alcohol  Negative    Illegal Drugs  Negative    Rx Drugs  Negative      Age   Age between 17-45 years   No      History of Preadolescent Sexual Abuse   History of Preadolescent Sexual Abuse  Negative or Female      Psychological Disease   Psychological Disease  Negative    Depression  Negative      Total Score   Opioid Risk Tool Scoring  2    Opioid Risk Interpretation  Low Risk      ORT Scoring interpretation table:  Score <3 = Low Risk for SUD  Score between 4-7 = Moderate Risk for SUD  Score >8 = High Risk for Opioid Abuse   Risk Mitigation Strategies:  Patient Counseling: Covered Patient-Prescriber Agreement (PPA): Present and active  Notification to other healthcare providers: Done  Pharmacologic Plan: No change in therapy, at this time.             Post-Procedure Assessment  03/27/2018 Procedure: Right lumbar facet radiofrequency ablation  Pre-procedure pain score:  10/10 Post-procedure pain score: 0/10         Influential  Factors: BMI: 22.82 kg/m Intra-procedural challenges: None observed.         Assessment challenges: None detected.              Reported side-effects: None.        Post-procedural adverse reactions or complications: None reported         Sedation: Please see nurses note. When no sedatives are used, the analgesic levels obtained are directly associated to the effectiveness of the local anesthetics. However, when sedation is provided, the level of analgesia obtained during the initial 1 hour following the intervention, is believed to be the result of a combination of factors. These factors may include, but are not limited to: 1. The effectiveness of the local anesthetics used. 2. The effects of the analgesic(s) and/or anxiolytic(s) used. 3. The degree of discomfort experienced by the patient at the time of the procedure. 4. The patients ability and reliability in recalling and recording the events. 5. The presence and influence of possible secondary gains and/or psychosocial factors. Reported result: Relief experienced during the 1st hour after the procedure: 100 % (Ultra-Short Term Relief)            Interpretative annotation: Clinically appropriate result. Analgesia during this period is likely to be Local Anesthetic and/or IV Sedative (Analgesic/Anxiolytic) related.          Effects of local anesthetic: The analgesic effects attained during this period are directly associated to the localized infiltration of local anesthetics and therefore cary significant  diagnostic value as to the etiological location, or anatomical origin, of the pain. Expected duration of relief is directly dependent on the pharmacodynamics of the local anesthetic used. Long-acting (4-6 hours) anesthetics used.  Reported result: Relief during the next 4 to 6 hour after the procedure: 90 % (Short-Term Relief)            Interpretative annotation: Clinically appropriate result. Analgesia during this period is likely to be Local  Anesthetic-related.          Long-term benefit: Defined as the period of time past the expected duration of local anesthetics (1 hour for short-acting and 4-6 hours for long-acting). With the possible exception of prolonged sympathetic blockade from the local anesthetics, benefits during this period are typically attributed to, or associated with, other factors such as analgesic sensory neuropraxia, antiinflammatory effects, or beneficial biochemical changes provided by agents other than the local anesthetics.  Reported result: Extended relief following procedure: 0 %("Never got better") (Long-Term Relief)            Interpretative annotation: Clinically possible results. Good relief. No permanent benefit expected. Inflammation plays a part in the etiology to the pain.          Current benefits: Defined as reported results that persistent at this point in time.   Analgesia: 0 %            Function: No benefit ROM: No benefit Interpretative annotation: No benefit.    Results would argue against repeating therapy.          Interpretation: Results would suggest failure of therapy in achieving desired goal(s).                  Plan:  Please see "Plan of Care" for details.                Laboratory Chemistry  Inflammation Markers (CRP: Acute Phase) (ESR: Chronic Phase) Lab Results  Component Value Date   ESRSEDRATE 18 07/25/2016                         Rheumatology Markers No results found for: RF, ANA, LABURIC, URICUR, LYMEIGGIGMAB, LYMEABIGMQN, HLAB27                      Renal Function Markers Lab Results  Component Value Date   BUN 27 (H) 12/06/2017   CREATININE 0.49 (L) 12/06/2017   BCR 55 (H) 12/06/2017   GFRAA >60 11/10/2015   GFRNONAA >60 11/10/2015                             Hepatic Function Markers Lab Results  Component Value Date   AST 22 06/04/2017   ALT 15 06/04/2017   ALBUMIN 4.1 06/04/2017   ALKPHOS 44 06/04/2017   LIPASE 181 11/02/2013                         Electrolytes Lab Results  Component Value Date   NA 136 12/06/2017   K 4.7 12/06/2017   CL 99 12/06/2017   CALCIUM 9.4 12/06/2017   MG 2.1 11/10/2015                        Neuropathy Markers Lab Results  Component Value Date   VITAMINB12 807 07/11/2017   FOLATE 18.9 11/10/2015   HGBA1C 5.9 07/12/2017  CNS Tests No results found for: COLORCSF, APPEARCSF, RBCCOUNTCSF, WBCCSF, POLYSCSF, LYMPHSCSF, EOSCSF, PROTEINCSF, GLUCCSF, JCVIRUS, CSFOLI, IGGCSF                      Bone Pathology Markers No results found for: VD25OH, [id number], ZY2482NO0, BB0488QB1, 25OHVITD1, 25OHVITD2, 25OHVITD3, TESTOFREE, TESTOSTERONE                       Coagulation Parameters Lab Results  Component Value Date   INR 1.3 02/05/2014   LABPROT 15.5 (H) 02/05/2014   APTT < 23.0 (L) 02/05/2014   PLT 210 04/08/2018                        Cardiovascular Markers Lab Results  Component Value Date   CKTOTAL 31 02/05/2014   CKMB 2.2 02/05/2014   TROPONINI <0.03 11/09/2015   HGB 14.0 04/08/2018   HCT 42.4 04/08/2018                         CA Markers No results found for: CEA, CA125, LABCA2                      Note: Lab results reviewed.  Recent Diagnostic Imaging Results  DG C-Arm 1-60 Min-No Report Fluoroscopy was utilized by the requesting physician.  No radiographic  interpretation.   Complexity Note: Imaging results reviewed. Results shared with Ms. Nienow, using Layman's terms.                         Meds   Current Outpatient Medications:  .  acetaminophen (TYLENOL) 325 MG tablet, Take 650 mg by mouth every 6 (six) hours as needed for moderate pain, fever or headache., Disp: , Rfl:  .  Cyanocobalamin (VITAMIN B 12 PO), Take 1,000 mg by mouth daily., Disp: , Rfl:  .  ELIQUIS 2.5 MG TABS tablet, TAKE 1 TABLET BY MOUTH TWICE A DAY, Disp: 60 tablet, Rfl: 11 .  furosemide (LASIX) 20 MG tablet, Take 1 tablet (20 mg total) by mouth daily., Disp: 90 tablet, Rfl:  1 .  Multiple Vitamin (MULTIVITAMIN) capsule, Take 1 capsule by mouth daily. , Disp: , Rfl:  .  Multiple Vitamins-Minerals (OCUVITE EYE HEALTH FORMULA) CAPS, Take 1 capsule by mouth daily., Disp: , Rfl:  .  sotalol (BETAPACE) 80 MG tablet, Take 40 mg by mouth 2 (two) times daily. , Disp: , Rfl:  .  SYNTHROID 50 MCG tablet, TAKE 1 TABLET (50 MCG TOTAL) BY MOUTH DAILY BEFORE BREAKFAST., Disp: 90 tablet, Rfl: 1 .  traMADol (ULTRAM) 50 MG tablet, Take 1 tablet (50 mg total) by mouth every 6 (six) hours as needed for severe pain., Disp: 120 tablet, Rfl: 5 .  lidocaine (XYLOCAINE) 5 % ointment, Apply 1 application topically 4 (four) times daily as needed for moderate pain., Disp: 35.44 g, Rfl: 2  ROS  Constitutional: Denies any fever or chills Gastrointestinal: No reported hemesis, hematochezia, vomiting, or acute GI distress Musculoskeletal: Denies any acute onset joint swelling, redness, loss of ROM, or weakness Neurological: No reported episodes of acute onset apraxia, aphasia, dysarthria, agnosia, amnesia, paralysis, loss of coordination, or loss of consciousness  Allergies  Ms. Skilton is allergic to hydrocodone; ciprofloxacin; and latex.  PFSH  Drug: Ms. Lauman  reports that she does not use drugs. Alcohol:  reports that she drinks about 1.0 standard drinks of  alcohol per week. Tobacco:  reports that she has never smoked. She has never used smokeless tobacco. Medical:  has a past medical history of Acute postoperative pain (12/17/2016), Arthritis, Atrial fibrillation ([place]), CHF (congestive heart failure) ([place]), Chickenpox, Chronic abdominal pain (01/16/2014), Degenerative arthritis of hip (08/19/2014), Degenerative arthritis of lumbar spine (11/18/2013), Depression, Diverticulitis, GERD (gastroesophageal reflux disease), Heart murmur, Heart rate slow, Hyperlipidemia, Hypertension, Hypothyroid, Neuritis or radiculitis due to rupture of lumbar intervertebral disc (11/18/2013), Skin cancer, and  Trochanteric bursitis of right hip (11/18/2013). Surgical: Ms. Zervas  has a past surgical history that includes Cataract extraction; Partial hysterectomy; Appendectomy; and Tonsillectomy. Family: family history includes Breast cancer in her mother and sister; Heart disease in her unknown relative; Hypertension in her unknown relative; Stroke in her maternal grandmother.  Constitutional Exam  General appearance: alert, cooperative, in mild distress and pale Vitals:   05/06/18 1416  BP: (!) 154/49  Pulse: (!) 46  Resp: 16  Temp: 97.6 F (36.4 C)  SpO2: 97%  Weight: 113 lb (51.3 kg)  Height: 4' 11"  (1.499 m)   BMI Assessment: Estimated body mass index is 22.82 kg/m as calculated from the following:   Height as of this encounter: 4' 11"  (1.499 m).   Weight as of this encounter: 113 lb (51.3 kg). Psych/Mental status: Alert, oriented x 3 (person, place, & time)       Eyes: PERLA Respiratory: No evidence of acute respiratory distress  Lumbar Spine Area Exam  Skin & Axial Inspection: No masses, redness, or swelling Alignment: Symmetrical Functional ROM: Unrestricted ROM       Stability: No instability detected Muscle Tone/Strength: Functionally intact. No obvious neuro-muscular anomalies detected. Sensory (Neurological): Unimpaired Palpation: Tender       Provocative Tests: Hyperextension/rotation test: deferred today       Lumbar quadrant test (Kemp's test): deferred today       Lateral bending test: deferred today       Patrick's Maneuver: deferred today                   FABER test: deferred today                   S-I anterior distraction/compression test: deferred today         S-I lateral compression test: deferred today         S-I Thigh-thrust test: deferred today         S-I Gaenslen's test: deferred today          Gait & Posture Assessment  Ambulation: Patient came in today in a wheel chair Gait: Relatively normal for age and body habitus Posture: WNL   Lower  Extremity Exam    Side: Right lower extremity  Side: Left lower extremity  Stability: No instability observed          Stability: No instability observed          Skin & Extremity Inspection: Skin color, temperature, and hair growth are WNL. No peripheral edema or cyanosis. No masses, redness, swelling, asymmetry, or associated skin lesions. No contractures.  Skin & Extremity Inspection: Skin color, temperature, and hair growth are WNL. No peripheral edema or cyanosis. No masses, redness, swelling, asymmetry, or associated skin lesions. No contractures.  Functional ROM: Unrestricted ROM                  Functional ROM: Unrestricted ROM  Muscle Tone/Strength: Functionally intact. No obvious neuro-muscular anomalies detected.  Muscle Tone/Strength: Functionally intact. No obvious neuro-muscular anomalies detected.  Sensory (Neurological): Unimpaired  Sensory (Neurological): Unimpaired  Palpation: No palpable anomalies  Palpation: No palpable anomalies   Assessment  Primary Diagnosis & Pertinent Problem List: The primary encounter diagnosis was Lumbar spondylosis. Diagnoses of Lumbar facet arthropathy, Scoliosis of lumbar spine, unspecified scoliosis type, and Chronic pain syndrome were also pertinent to this visit.  Status Diagnosis  Not responding Not responding Persistent 1. Lumbar spondylosis   2. Lumbar facet arthropathy   3. Scoliosis of lumbar spine, unspecified scoliosis type   4. Chronic pain syndrome     Problems updated and reviewed during this visit: No problems updated. Plan of Care  Pharmacotherapy (Medications Ordered): Meds ordered this encounter  Medications  . lidocaine (XYLOCAINE) 5 % ointment    Sig: Apply 1 application topically 4 (four) times daily as needed for moderate pain.    Dispense:  35.44 g    Refill:  2    Maximum dose: 5 g/application (approximately 6 inches of ointment); 20 g/day    Order Specific Question:   Supervising Provider     Answer:   Milinda Pointer [phone]   New Prescriptions   LIDOCAINE (XYLOCAINE) 5 % OINTMENT    Apply 1 application topically 4 (four) times daily as needed for moderate pain.   Medications administered today: [name] had no medications administered during this visit. Lab-work, procedure(s), and/or referral(s): No orders of the defined types were placed in this encounter.  Imaging and/or referral(s): None  Interventional management options: Planned, scheduled, and/or pending: NOTE:Stop Elaquisfor 3 daysprior to aprocedure. Not at this time.   Considering: Therapeuticright-sided lumbar facet+right-sidedSIjoint RFA#1   Palliative PRN treatment(s): Palliative right-sided lumbar facet block#4 Palliative right-sided sacroiliac joint block#4    Provider-requested follow-up: Return in about 5 months (around 10/05/2018) for MedMgmt.  Future Appointments  Date Time Provider [place]  06/09/2018  1:45 PM [name], MD [facility]  07/24/2018  1:15 PM Gardiner Barefoot, DPM [facility]  07/28/2018  1:00 PM O'Brien-Blaney, Bryson Corona, LPN [facility]  0/94/0768  1:30 PM Vevelyn Francois, NP [facility] None   Primary Care Physician: [name], MD Location: [facility] Note by: Vevelyn Francois NP Date: 05/06/2018; Time: 3:01 PM  Pain Score Disclaimer: We use the NRS-11 scale. This is a self-reported, subjective measurement of pain severity with only modest accuracy. It is used primarily to identify changes within a particular patient. It must be understood that outpatient pain scales are significantly less accurate that those used for research, where they can be applied under ideal controlled circumstances with minimal exposure to variables. In reality, the score is likely to be a combination of pain intensity and pain affect, where pain affect describes the degree of emotional arousal or changes in  action readiness caused by the sensory experience of pain. Factors such as social and work situation, setting, emotional state, anxiety levels, expectation, and prior pain experience may influence pain perception and show large inter-individual differences that may also be affected by time variables.  Patient instructions provided during this appointment: Patient Instructions  ____________________________________________________________________________________________  Medication Rules  Applies to: All patients receiving prescriptions (written or electronic).  Pharmacy of record: Pharmacy where electronic prescriptions will be sent. If written prescriptions are taken to a different pharmacy, please inform the nursing staff. The pharmacy listed in the electronic medical record should be the one where  you would like electronic prescriptions to be sent.  Prescription refills: Only during scheduled appointments. Applies to both, written and electronic prescriptions.  NOTE: The following applies primarily to controlled substances (Opioid* Pain Medications).   Patient's responsibilities: 1. Pain Pills: Bring all pain pills to every appointment (except for procedure appointments). 2. Pill Bottles: Bring pills in original pharmacy bottle. Always bring newest bottle. Bring bottle, even if empty. 3. Medication refills: You are responsible for knowing and keeping track of what medications you need refilled. The day before your appointment, write a list of all prescriptions that need to be refilled. Bring that list to your appointment and give it to the admitting nurse. Prescriptions will be written only during appointments. If you forget a medication, it will not be "Called in", "Faxed", or "electronically sent". You will need to get another appointment to get these prescribed. 4. Prescription Accuracy: You are responsible for carefully inspecting your prescriptions before leaving our office. Have the  discharge nurse carefully go over each prescription with you, before taking them home. Make sure that your name is accurately spelled, that your address is correct. Check the name and dose of your medication to make sure it is accurate. Check the number of pills, and the written instructions to make sure they are clear and accurate. Make sure that you are given enough medication to last until your next medication refill appointment. 5. Taking Medication: Take medication as prescribed. Never take more pills than instructed. Never take medication more frequently than prescribed. Taking less pills or less frequently is permitted and encouraged, when it comes to controlled substances (written prescriptions).  6. Inform other Doctors: Always inform, all of your healthcare providers, of all the medications you take. 7. Pain Medication from other Providers: You are not allowed to accept any additional pain medication from any other Doctor or Healthcare provider. There are two exceptions to this rule. (see below) In the event that you require additional pain medication, you are responsible for notifying us, as stated below. 8. Medication Agreement: You are responsible for carefully reading and following our Medication Agreement. This must be signed before receiving any prescriptions from our practice. Safely store a copy of your signed Agreement. Violations to the Agreement will result in no further prescriptions. (Additional copies of our Medication Agreement are available upon request.) 9. Laws, Rules, & Regulations: All patients are expected to follow all Federal and [employer], [employer], Rules, [employer]. Ignorance of the Laws does not constitute a valid excuse. The use of any illegal substances is prohibited. 10. Adopted CDC guidelines & recommendations: Target dosing levels will be at or below 60 MME/day. Use of benzodiazepines** is not recommended.  Exceptions: There are only two exceptions to the rule of  not receiving pain medications from other Healthcare Providers. 1. Exception #1 (Emergencies): In the event of an emergency (i.e.: accident requiring emergency care), you are allowed to receive additional pain medication. However, you are responsible for: As soon as you are able, call our office [phone], at any time of the day or night, and leave a message stating your name, the date and nature of the emergency, and the name and dose of the medication prescribed. In the event that your call is answered by a member of our staff, make sure to document and save the date, time, and the name of the person that took your information.  2. Exception #2 (Planned Surgery): In the event that you are scheduled by another doctor or dentist  to have any type of surgery or procedure, you are allowed (for a period no longer than 30 days), to receive additional pain medication, for the acute post-op pain. However, in this case, you are responsible for picking up a copy of our "Post-op Pain Management for Surgeons" handout, and giving it to your surgeon or dentist. This document is available at our office, and does not require an appointment to obtain it. Simply go to our office during business hours (Monday-Thursday from 8:00 AM to 4:00 PM) (Friday 8:00 AM to 12:00 Noon) or if you have a scheduled appointment with [place], prior to your surgery, and ask for it by name. In addition, you will need to provide [place] with your name, name of your surgeon, type of surgery, and date of procedure or surgery.  *Opioid medications include: morphine, codeine, oxycodone, oxymorphone, hydrocodone, hydromorphone, meperidine, tramadol, tapentadol, buprenorphine, fentanyl, methadone. **Benzodiazepine medications include: diazepam (Valium), alprazolam (Xanax), clonazepam (Klonopine), lorazepam (Ativan), clorazepate (Tranxene), chlordiazepoxide (Librium), estazolam (Prosom), oxazepam (Serax), temazepam (Restoril), triazolam (Halcion) (Last  updated: 08/29/2017) ____________________________________________________________________________________________

## 2018-05-06 NOTE — Progress Notes (Signed)
Safety precautions to be maintained throughout the outpatient stay will include: orient to surroundings, keep bed in low position, maintain call bell within reach at all times, provide assistance with transfer out of bed and ambulation.  

## 2018-05-08 ENCOUNTER — Ambulatory Visit: Payer: Medicare Other | Admitting: Nurse Practitioner

## 2018-06-03 ENCOUNTER — Other Ambulatory Visit: Payer: Self-pay | Admitting: Family Medicine

## 2018-06-05 ENCOUNTER — Other Ambulatory Visit: Payer: Self-pay | Admitting: Gastroenterology

## 2018-06-05 DIAGNOSIS — R109 Unspecified abdominal pain: Secondary | ICD-10-CM | POA: Diagnosis not present

## 2018-06-05 DIAGNOSIS — K2970 Gastritis, unspecified, without bleeding: Secondary | ICD-10-CM | POA: Diagnosis not present

## 2018-06-05 DIAGNOSIS — K2900 Acute gastritis without bleeding: Secondary | ICD-10-CM

## 2018-06-06 ENCOUNTER — Other Ambulatory Visit: Payer: Self-pay | Admitting: Gastroenterology

## 2018-06-06 DIAGNOSIS — R1084 Generalized abdominal pain: Secondary | ICD-10-CM

## 2018-06-09 ENCOUNTER — Encounter: Payer: Self-pay | Admitting: Family Medicine

## 2018-06-09 ENCOUNTER — Ambulatory Visit (INDEPENDENT_AMBULATORY_CARE_PROVIDER_SITE_OTHER): Payer: Medicare Other | Admitting: Family Medicine

## 2018-06-09 VITALS — BP 104/62 | HR 112 | Temp 98.0°F | Ht 59.0 in | Wt 120.2 lb

## 2018-06-09 DIAGNOSIS — K921 Melena: Secondary | ICD-10-CM | POA: Diagnosis not present

## 2018-06-09 DIAGNOSIS — I4891 Unspecified atrial fibrillation: Secondary | ICD-10-CM

## 2018-06-09 DIAGNOSIS — F32A Depression, unspecified: Secondary | ICD-10-CM

## 2018-06-09 DIAGNOSIS — F329 Major depressive disorder, single episode, unspecified: Secondary | ICD-10-CM

## 2018-06-09 DIAGNOSIS — J309 Allergic rhinitis, unspecified: Secondary | ICD-10-CM | POA: Diagnosis not present

## 2018-06-09 DIAGNOSIS — F419 Anxiety disorder, unspecified: Secondary | ICD-10-CM

## 2018-06-09 DIAGNOSIS — E039 Hypothyroidism, unspecified: Secondary | ICD-10-CM | POA: Diagnosis not present

## 2018-06-09 LAB — BASIC METABOLIC PANEL
BUN: 19 mg/dL (ref 6–23)
CO2: 30 mEq/L (ref 19–32)
Calcium: 9.4 mg/dL (ref 8.4–10.5)
Chloride: 102 mEq/L (ref 96–112)
Creatinine, Ser: 0.87 mg/dL (ref 0.40–1.20)
GFR: 64.82 mL/min (ref >60.00)
Glucose, Bld: 118 mg/dL — ABNORMAL HIGH (ref 70–99)
Potassium: 4 mEq/L (ref 3.5–5.1)
Sodium: 139 mEq/L (ref 135–145)

## 2018-06-09 LAB — TSH: TSH: 1.46 u[IU]/mL (ref 0.35–4.50)

## 2018-06-09 MED ORDER — FLUTICASONE PROPIONATE 50 MCG/ACT NA SUSP: 2.0000 | Freq: Every day | NASAL | 1 refills | Status: DC

## 2018-06-09 NOTE — Patient Instructions (Signed)
Nice to see you. Please monitor your depression.  If it worsens please let us know. Please try the Flonase for your runny nose and sneezing. Please complete evaluation through GI. We will get lab work today and contact you with the results. If you develop chest pain or palpitations please be evaluated.

## 2018-06-09 NOTE — Progress Notes (Signed)
  [name], MD Phone: [phone]  Amanda Soto is a 84 y.o. female who presents today for f/u.  CC: melena, hypothyroidism, afib, rhinorrhea  Melena: Patient notes intermittent issues with black-colored stools that are sticky.  She has intermittent normal bowel movements as well.  She thinks it may be related to what she is eating.  She does eat Oreos and licorice.  She notes no bright red blood per rectum.  She did see GI and they started her back on omeprazole.  They are obtaining a CT abdomen and pelvis.  She notes no abdominal pain.  Hypothyroidism: Taking Synthroid.  She did note some focal skin itching based on the detergent she was using.  She stopped that detergent and the itching went away.  No rash.  No heat or cold intolerance.  A. fib: Heart rate was noted to be elevated at GI.  They discussed with her cardiologist who appears to advised no additional intervention and for the patient to monitor for any symptoms.  She has noted no chest pain or palpitations.  Rhinorrhea: She does note intermittent rhinorrhea recently.  She notes some sneezing.  She notes this occurs off and on.  No postnasal drip.  No fever.  Occasional congestion.  No allergy medications.  Depression: Patient notes some depression related to not being able to get out and go as she would like.  She has to have somebody drive her places.  She notes that her depression is somewhat worse.  No SI.  She defers medication and therapy.  Social History   Tobacco Use  Smoking Status Never Smoker  Smokeless Tobacco Never Used     ROS see history of present illness  Objective  Physical Exam Vitals:   06/09/18 1342  BP: 104/62  Pulse: (!) 112  Temp: 98 F (36.7 C)  SpO2: 95%    BP Readings from Last 3 Encounters:  06/09/18 104/62  05/06/18 (!) 154/49  04/08/18 130/68   Wt Readings from Last 3 Encounters:  06/09/18 120 lb 3.2 oz (54.5 kg)  05/06/18 113 lb (51.3 kg)  04/08/18 115 lb 9.6 oz (52.4  kg)    Physical Exam  Constitutional: No distress.  HENT:  Head: Normocephalic and atraumatic.  Mouth/Throat: Oropharynx is clear and moist.  Eyes: Pupils are equal, round, and reactive to light. Conjunctivae are normal.  Cardiovascular: Regular rhythm and normal heart sounds. Tachycardia present.  Pulmonary/Chest: Effort normal and breath sounds normal.  Musculoskeletal: She exhibits no edema.  Neurological: She is alert.  Skin: Skin is warm and dry. She is not diaphoretic.     Assessment/Plan: Please see individual problem list.  Melena Patient will complete her evaluation through GI.  Recent hemoglobin through GI stable.  Hypothyroidism Check TSH.  Continue Synthroid.  Anxiety and depression Patient declines treatment.  Given return precautions.  Allergic rhinitis Trial of Flonase.  Atrial fibrillation ([place]) Seems to be in mild sinus tachycardia.  Asymptomatic.  Per review of GIs recent note with her discussion with cardiology we will have the patient monitor for symptoms.    Orders Placed This Encounter  Procedures  . Basic Metabolic Panel (BMET)  . TSH    Meds ordered this encounter  Medications  . fluticasone (FLONASE) 50 MCG/ACT nasal spray    Sig: Place 2 sprays into both nostrils daily.    Dispense:  16 g    Refill:  Isabela, MD [place]

## 2018-06-10 ENCOUNTER — Ambulatory Visit: Payer: Medicare Other

## 2018-06-10 ENCOUNTER — Encounter: Payer: Self-pay | Admitting: Family Medicine

## 2018-06-10 DIAGNOSIS — J309 Allergic rhinitis, unspecified: Secondary | ICD-10-CM | POA: Insufficient documentation

## 2018-06-10 NOTE — Assessment & Plan Note (Signed)
Patient declines treatment.  Given return precautions.

## 2018-06-10 NOTE — Assessment & Plan Note (Signed)
Patient will complete her evaluation through GI.  Recent hemoglobin through GI stable.

## 2018-06-10 NOTE — Assessment & Plan Note (Signed)
Seems to be in mild sinus tachycardia.  Asymptomatic.  Per review of GIs recent note with her discussion with cardiology we will have the patient monitor for symptoms.

## 2018-06-10 NOTE — Assessment & Plan Note (Signed)
Trial of Flonase

## 2018-06-10 NOTE — Assessment & Plan Note (Signed)
Check TSH.  Continue Synthroid. 

## 2018-06-11 ENCOUNTER — Encounter: Payer: Medicare Other | Admitting: Nurse Practitioner

## 2018-06-13 ENCOUNTER — Other Ambulatory Visit: Payer: Self-pay | Admitting: Family Medicine

## 2018-06-13 ENCOUNTER — Ambulatory Visit
Admission: RE | Admit: 2018-06-13 | Discharge: 2018-06-13 | Disposition: A | Discharge status: Home / Self Care | Payer: Medicare Other | Source: Ambulatory Visit | Attending: Gastroenterology | Admitting: Gastroenterology

## 2018-06-13 DIAGNOSIS — N179 Acute kidney failure, unspecified: Secondary | ICD-10-CM

## 2018-06-13 DIAGNOSIS — R1084 Generalized abdominal pain: Secondary | ICD-10-CM

## 2018-06-13 DIAGNOSIS — K5730 Diverticulosis of large intestine without perforation or abscess without bleeding: Secondary | ICD-10-CM | POA: Diagnosis not present

## 2018-06-13 MED ORDER — IOPAMIDOL (ISOVUE-300) INJECTION 61%
100.0000 mL | Freq: Once | INTRAVENOUS | Status: AC | PRN
  Administered 2018-06-13: 75 mL via INTRAVENOUS

## 2018-07-03 ENCOUNTER — Other Ambulatory Visit: Payer: Self-pay | Admitting: Family Medicine

## 2018-07-03 ENCOUNTER — Other Ambulatory Visit (INDEPENDENT_AMBULATORY_CARE_PROVIDER_SITE_OTHER): Payer: Medicare Other

## 2018-07-03 ENCOUNTER — Ambulatory Visit (INDEPENDENT_AMBULATORY_CARE_PROVIDER_SITE_OTHER): Payer: Medicare Other | Admitting: Family Medicine

## 2018-07-03 ENCOUNTER — Encounter: Payer: Self-pay | Admitting: Family Medicine

## 2018-07-03 VITALS — BP 132/60 | HR 52 | Temp 98.2°F | Ht 59.0 in | Wt 120.8 lb

## 2018-07-03 DIAGNOSIS — N179 Acute kidney failure, unspecified: Secondary | ICD-10-CM | POA: Diagnosis not present

## 2018-07-03 DIAGNOSIS — J019 Acute sinusitis, unspecified: Secondary | ICD-10-CM

## 2018-07-03 LAB — BASIC METABOLIC PANEL
BUN: 15 mg/dL (ref 6–23)
CALCIUM: 8.9 mg/dL (ref 8.4–10.5)
CO2: 27 mEq/L (ref 19–32)
Chloride: 99 mEq/L (ref 96–112)
Creatinine, Ser: 0.77 mg/dL (ref 0.40–1.20)
GFR: 74.62 mL/min (ref >60.00)
Glucose, Bld: 90 mg/dL (ref 70–99)
Potassium: 4 mEq/L (ref 3.5–5.1)
Sodium: 136 mEq/L (ref 135–145)

## 2018-07-03 MED ORDER — AMOXICILLIN 500 MG PO CAPS: 500.0000 mg | ORAL_CAPSULE | Freq: Two times a day (BID) | ORAL | 0 refills | Status: DC

## 2018-07-03 NOTE — Progress Notes (Signed)
Subjective:    Patient ID: Amanda Soto, female    DOB: Nov 29, 1924, 85 y.o.   MRN: [id number]  HPI  Presents to clinic c/o sinus congestion, pressure, drainage for 10 days.  Patient states she called her pharmacist, who recommended an over-the-counter allergy medicine that has helped minimally to improve some of the drippage from nose, but she continues to have sinus pain especially on the left cheek.  Denies any fever or chills.  Denies nausea, vomiting or diarrhea.  Appetite has been normal.  Denies cough, shortness of breath or wheezing.  Patient Active Problem List   Diagnosis Date Noted  . Allergic rhinitis 06/10/2018  . Melena 04/10/2018  . Headache 04/10/2018  . Spondylosis without myelopathy or radiculopathy, lumbosacral region 01/09/2018  . Other specified dorsopathies, sacral and sacrococcygeal region 01/09/2018  . History of allergy to latex 01/09/2018  . Actinic keratoses 06/04/2017  . Chronic anticoagulation (Eliquis) 04/24/2017  . DDD (degenerative disc disease), lumbar 04/09/2017  . Chronic sacroiliac joint pain (Right) 04/04/2017  . History of fall 02/11/2017  . Chronic fatigue 07/25/2016  . Chronic pain syndrome 05/29/2016  . Macular degeneration 01/20/2016  . Lumbar spondylosis 11/15/2015  . Scoliosis of lumbar spine (concave to right side) 11/15/2015  . Lumbar facet arthropathy 11/15/2015  . Osteoarthritis of hip (Right) 11/15/2015  . Thoracic paracentral T7-8 disc protrusion 11/15/2015  . Cardiac murmur 11/14/2015  . HLD (hyperlipidemia) 11/14/2015  . MI (mitral incompetence) 11/14/2015  . OP (osteoporosis) 11/14/2015  . Chronic low back pain (Primary Source of Pain) (Right) 11/14/2015  . Lumbar facet syndrome (Right) 11/14/2015  . Grade 1 Anterolisthesis of L4 over L5 (5 mm) 11/14/2015  . Hypothyroidism 11/07/2015  . Lightheadedness 11/07/2015  . Elevated glucose 09/07/2015  . Exertional shortness of breath 08/25/2015  . Chronic systolic heart failure  ([place]) 11/09/2014  . Bradycardia 11/09/2014  . Atrial fibrillation ([place]) 11/09/2014  . HTN (hypertension) 11/09/2014  . Anxiety and depression 01/16/2014  . Recurrent major depressive disorder, in partial remission ([place]) 01/16/2014  . Degeneration of intervertebral disc of lumbar region 11/18/2013  . H/O neoplasm 10/21/2012  . History of nonmelanoma skin cancer 10/21/2012   Social History   Tobacco Use  . Smoking status: Never Smoker  . Smokeless tobacco: Never Used  Substance Use Topics  . Alcohol use: Yes    Alcohol/week: 1.0 standard drinks    Types: 1 Glasses of wine per week    Comment: rare   Review of Systems  Constitutional: Negative for chills, fatigue and fever.  HENT: +congestion, sinus pain (left more than right), runny nose.    Eyes: Negative.   Respiratory: Negative for cough, shortness of breath and wheezing.   Cardiovascular: Negative for chest pain, palpitations and leg swelling.  Gastrointestinal: Negative for abdominal pain, diarrhea, nausea and vomiting.  Genitourinary: Negative for dysuria, frequency and urgency.  Musculoskeletal: Negative for arthralgias and myalgias.  Skin: Negative for color change, pallor and rash.  Neurological: Negative for syncope, light-headedness and headaches.  Psychiatric/Behavioral: The patient is not nervous/anxious.       Objective:   Physical Exam Vitals signs and nursing note reviewed.  Constitutional:      General: She is not in acute distress.    Appearance: She is not toxic-appearing.  HENT:     Head: Normocephalic and atraumatic.     Nose: Congestion and rhinorrhea present.     Right Turbinates: Swollen and pale.     Left Turbinates: Swollen and pale.  Left Sinus: Maxillary sinus tenderness present.     Comments: +yellow nasal discharge    Mouth/Throat:     Mouth: Mucous membranes are moist.     Comments: +POST NASAL DRIP Eyes:     General: No scleral icterus.    Extraocular Movements: Extraocular  movements intact.     Conjunctiva/sclera: Conjunctivae normal.  Cardiovascular:     Rate and Rhythm: Regular rhythm.     Heart sounds: Normal heart sounds.  Pulmonary:     Effort: Pulmonary effort is normal. No respiratory distress.     Breath sounds: Normal breath sounds.  Skin:    General: Skin is warm and dry.     Coloration: Skin is not pale.  Neurological:     Mental Status: She is alert and oriented to person, place, and time.  Psychiatric:        Mood and Affect: Mood normal.        Behavior: Behavior normal.       Vitals:   07/03/18 1410  BP: 132/60  Pulse: (!) 52  Temp: 98.2 F (36.8 C)  SpO2: 93%   Assessment & Plan:   Sinusitis - patient will take amoxicillin 500 mg twice daily for 10 days.   Creatinine clearance calculated and is 36.4 mL/min.  Patient will continue taking the allergy medicine as recommended by her pharmacist and also has been advised she can do a saline nasal spray to help improve congestion symptoms.  Advised to rest, increase fluid intake and do good handwashing.    Patient will keep regularly scheduled follow-up PCP as planned.  She return to clinic sooner if any issues arise.

## 2018-07-04 ENCOUNTER — Other Ambulatory Visit: Payer: Medicare Other

## 2018-07-07 DIAGNOSIS — I8310 Varicose veins of unspecified lower extremity with inflammation: Secondary | ICD-10-CM | POA: Diagnosis not present

## 2018-07-07 DIAGNOSIS — L578 Other skin changes due to chronic exposure to nonionizing radiation: Secondary | ICD-10-CM | POA: Diagnosis not present

## 2018-07-07 DIAGNOSIS — L821 Other seborrheic keratosis: Secondary | ICD-10-CM | POA: Diagnosis not present

## 2018-07-07 DIAGNOSIS — L820 Inflamed seborrheic keratosis: Secondary | ICD-10-CM | POA: Diagnosis not present

## 2018-07-07 DIAGNOSIS — L812 Freckles: Secondary | ICD-10-CM | POA: Diagnosis not present

## 2018-07-24 ENCOUNTER — Ambulatory Visit: Payer: Medicare Other | Admitting: Podiatry

## 2018-07-24 ENCOUNTER — Encounter: Payer: Self-pay | Admitting: Podiatry

## 2018-07-24 DIAGNOSIS — M79609 Pain in unspecified limb: Principal | ICD-10-CM

## 2018-07-24 DIAGNOSIS — M79676 Pain in unspecified toe(s): Secondary | ICD-10-CM | POA: Diagnosis not present

## 2018-07-24 DIAGNOSIS — D689 Coagulation defect, unspecified: Secondary | ICD-10-CM | POA: Diagnosis not present

## 2018-07-24 DIAGNOSIS — B351 Tinea unguium: Secondary | ICD-10-CM

## 2018-07-24 NOTE — Progress Notes (Signed)
Complaint:  Visit Type: Patient returns to my office for continued preventative foot care services. Complaint: Patient states" my nails have grown long and thick and become painful to walk and wear shoes" . The patient presents for preventative foot care services. No changes to ROS.  Patient is taking eliquiss.  Podiatric Exam: Vascular: dorsalis pedis and posterior tibial pulses are palpable bilateral. Capillary return is immediate. Temperature gradient is WNL. Skin turgor WNL  Sensorium: Normal Semmes Weinstein monofilament test. Normal tactile sensation bilaterally. Nail Exam: Pt has thick disfigured discolored nails with subungual debris noted bilateral entire nail hallux through fifth toenails Ulcer Exam: There is no evidence of ulcer or pre-ulcerative changes or infection. Orthopedic Exam: Muscle tone and strength are WNL. No limitations in general ROM. No crepitus or effusions noted. Foot type and digits show no abnormalities. Bony prominences are unremarkable. Skin: No Porokeratosis. No infection or ulcers  Diagnosis:  Onychomycosis, , Pain in right toe, pain in left toes  Treatment & Plan Procedures and Treatment: Consent by patient was obtained for treatment procedures.   Debridement of mycotic and hypertrophic toenails, 1 through 5 bilateral and clearing of subungual debris. No ulceration, no infection noted.  Return Visit-Office Procedure: Patient instructed to return to the office for a follow up visit 3 months for continued evaluation and treatment.    Montia Haslip DPM 

## 2018-07-28 ENCOUNTER — Ambulatory Visit: Payer: Medicare Other

## 2018-07-28 ENCOUNTER — Other Ambulatory Visit: Payer: Self-pay | Admitting: Family Medicine

## 2018-08-13 DIAGNOSIS — H353131 Nonexudative age-related macular degeneration, bilateral, early dry stage: Secondary | ICD-10-CM | POA: Diagnosis not present

## 2018-08-18 DIAGNOSIS — R5383 Other fatigue: Secondary | ICD-10-CM | POA: Diagnosis not present

## 2018-08-18 DIAGNOSIS — I499 Cardiac arrhythmia, unspecified: Secondary | ICD-10-CM | POA: Diagnosis not present

## 2018-08-18 DIAGNOSIS — R001 Bradycardia, unspecified: Secondary | ICD-10-CM | POA: Diagnosis not present

## 2018-08-18 DIAGNOSIS — I480 Paroxysmal atrial fibrillation: Secondary | ICD-10-CM | POA: Diagnosis not present

## 2018-08-22 ENCOUNTER — Other Ambulatory Visit: Payer: Self-pay | Admitting: Nurse Practitioner

## 2018-09-08 ENCOUNTER — Ambulatory Visit (INDEPENDENT_AMBULATORY_CARE_PROVIDER_SITE_OTHER): Payer: Medicare Other

## 2018-09-08 ENCOUNTER — Ambulatory Visit (INDEPENDENT_AMBULATORY_CARE_PROVIDER_SITE_OTHER): Payer: Medicare Other | Admitting: Family Medicine

## 2018-09-08 ENCOUNTER — Encounter: Payer: Self-pay | Admitting: Family Medicine

## 2018-09-08 VITALS — BP 128/70 | HR 47 | Temp 97.9°F | Resp 16 | Ht <= 58 in | Wt 119.1 lb

## 2018-09-08 VITALS — BP 140/60 | HR 51 | Temp 97.8°F | Ht <= 58 in | Wt 119.9 lb

## 2018-09-08 DIAGNOSIS — K921 Melena: Secondary | ICD-10-CM

## 2018-09-08 DIAGNOSIS — M545 Low back pain, unspecified: Secondary | ICD-10-CM

## 2018-09-08 DIAGNOSIS — J90 Pleural effusion, not elsewhere classified: Secondary | ICD-10-CM

## 2018-09-08 DIAGNOSIS — Z0000 Encounter for general adult medical examination without abnormal findings: Secondary | ICD-10-CM

## 2018-09-08 DIAGNOSIS — F32A Depression, unspecified: Secondary | ICD-10-CM

## 2018-09-08 DIAGNOSIS — F329 Major depressive disorder, single episode, unspecified: Secondary | ICD-10-CM

## 2018-09-08 DIAGNOSIS — F419 Anxiety disorder, unspecified: Secondary | ICD-10-CM

## 2018-09-08 DIAGNOSIS — G8929 Other chronic pain: Secondary | ICD-10-CM

## 2018-09-08 DIAGNOSIS — R931 Abnormal findings on diagnostic imaging of heart and coronary circulation: Secondary | ICD-10-CM | POA: Diagnosis not present

## 2018-09-08 MED ORDER — LEVOTHYROXINE SODIUM 50 MCG PO TABS: 50.0000 ug | ORAL_TABLET | Freq: Every day | ORAL | 1 refills | Status: DC

## 2018-09-08 MED ORDER — FUROSEMIDE 20 MG PO TABS: 20.0000 mg | ORAL_TABLET | Freq: Every day | ORAL | 1 refills | Status: DC

## 2018-09-08 NOTE — Progress Notes (Signed)
Subjective:   Amanda Soto is a 85 y.o. female who presents for Medicare Annual (Subsequent) preventive examination.  Review of Systems:  No ROS.  Medicare Wellness Visit. Additional risk factors are reflected in the social history. Cardiac Risk Factors include: advanced age (>38men, >50 women);hypertension     Objective:     Vitals: BP 128/70 (BP Location: Left Arm, Patient Position: Sitting, Cuff Size: Normal)   Pulse (!) 47   Temp 97.9 F (36.6 C) (Oral)   Resp 16   Ht 4' 9.25" (1.454 m)   Wt 119 lb 1.9 oz (54 kg)   SpO2 98%   BMI 25.55 kg/m   Body mass index is 25.55 kg/m.  Advanced Directives 09/08/2018 03/27/2018 07/26/2017 04/24/2017 04/09/2017 04/04/2017 01/28/2017  Does Patient Have a Medical Advance Directive? Yes Yes Yes Yes Yes Yes Yes  Type of Paramedic of [place];Living will [place];Living will [place];Living will Living will [place];Living will [place];Living will [place];Living will  Does patient want to make changes to medical advance directive? No - Patient declined - - - - No - Patient declined -  Copy of [place] in Chart? No - copy requested - No - copy requested - - Yes -  Would patient like information on creating a medical advance directive? - - - - - - -    Tobacco Social History   Tobacco Use  Smoking Status Never Smoker  Smokeless Tobacco Never Used     Counseling given: Not Answered   Clinical Intake:  Pre-visit preparation completed: Yes        Diabetes: No  How often do you need to have someone help you when you read instructions, pamphlets, or other written materials from your doctor or pharmacy?: 1 - Never  Interpreter Needed?: No     Past Medical History:  Diagnosis Date  . Acute postoperative pain 12/17/2016  . Anemia 11/10/2015  . Arthritis   . Atrial fibrillation ([place])   .  CHF (congestive heart failure) ([place])   . Chickenpox   . Chronic abdominal pain 01/16/2014  . Closed Colles' fracture 12/17/2016  . Degenerative arthritis of hip 08/19/2014  . Degenerative arthritis of lumbar spine 11/18/2013  . Depression   . Diverticulitis   . GERD (gastroesophageal reflux disease)   . Heart murmur   . Heart rate slow   . Hyperlipidemia   . Hypertension   . Hypothyroid   . Neuritis or radiculitis due to rupture of lumbar intervertebral disc 11/18/2013  . Skin cancer   . Symptomatic anemia 11/09/2015  . Trochanteric bursitis of right hip 11/18/2013   Past Surgical History:  Procedure Laterality Date  . APPENDECTOMY    . CATARACT EXTRACTION    . PARTIAL HYSTERECTOMY    . TONSILLECTOMY     Family History  Problem Relation Age of Onset  . Stroke Maternal Grandmother   . Breast cancer Mother   . Breast cancer Sister   . Stroke Sister   . Multiple sclerosis Sister   . Heart disease Other        Parent, grandparent, sons  . Hypertension Other        Parent, grandparent   Social History   Socioeconomic History  . Marital status: Widowed    Spouse name: Not on file  . Number of children: Not on file  . Years of education: Not on file  .  Highest education level: Not on file  Occupational History  . Not on file  Social Needs  . Financial resource strain: Not hard at all  . Food insecurity:    Worry: Never true    Inability: Never true  . Transportation needs:    Medical: No    Non-medical: No  Tobacco Use  . Smoking status: Never Smoker  . Smokeless tobacco: Never Used  Substance and Sexual Activity  . Alcohol use: Yes    Alcohol/week: 1.0 standard drinks    Types: 1 Glasses of wine per week    Comment: rare  . Drug use: No  . Sexual activity: Never  Lifestyle  . Physical activity:    Days per week: 2 days    Minutes per session: 10 min  . Stress: Not on file  Relationships  . Social connections:    Talks on phone: Not on file    Gets  together: Not on file    Attends religious service: Not on file    Active member of club or organization: Not on file    Attends meetings of clubs or organizations: Not on file    Relationship status: Not on file  Other Topics Concern  . Not on file  Social History Narrative  . Not on file    Outpatient Encounter Medications as of 09/08/2018  Medication Sig  . acetaminophen (TYLENOL) 325 MG tablet Take 650 mg by mouth every 6 (six) hours as needed for moderate pain, fever or headache.  . Cyanocobalamin (VITAMIN B 12 PO) Take 1,000 mg by mouth daily.  Marland Kitchen ELIQUIS 2.5 MG TABS tablet TAKE 1 TABLET BY MOUTH TWICE A DAY  . fluticasone (FLONASE) 50 MCG/ACT nasal spray SPRAY 2 SPRAYS INTO EACH NOSTRIL EVERY DAY  . furosemide (LASIX) 20 MG tablet Take 1 tablet (20 mg total) by mouth daily.  . Multiple Vitamin (MULTIVITAMIN) capsule Take 1 capsule by mouth daily.   . Multiple Vitamins-Minerals (OCUVITE EYE HEALTH FORMULA) CAPS Take 1 capsule by mouth daily.  Marland Kitchen omeprazole (PRILOSEC) 40 MG capsule Take by mouth.  . sotalol (BETAPACE) 80 MG tablet Take 40 mg by mouth 2 (two) times daily.   Marland Kitchen SYNTHROID 50 MCG tablet TAKE 1 TABLET (50 MCG TOTAL) BY MOUTH DAILY BEFORE BREAKFAST.  Marland Kitchen traMADol (ULTRAM) 50 MG tablet Take 1 tablet (50 mg total) by mouth every 6 (six) hours as needed for severe pain.  . [DISCONTINUED] amoxicillin (AMOXIL) 500 MG capsule Take 1 capsule (500 mg total) by mouth 2 (two) times daily.   No facility-administered encounter medications on file as of 09/08/2018.     Activities of Daily Living In your present state of health, do you have any difficulty performing the following activities: 09/08/2018  Hearing? N  Vision? Y  Comment Macular degeneration  Difficulty concentrating or making decisions? N  Walking or climbing stairs? Y  Comment Walker in use. Chronic back pain.   Dressing or bathing? N  Doing errands, shopping? Y  Comment She does  not Physiological scientist and eating ? Y   Using the Toilet? N  In the past six months, have you accidently leaked urine? N  Do you have problems with loss of bowel control? N  Managing your Medications? N  Managing your Finances? N  Housekeeping or managing your Housekeeping? Y  Some recent data might be hidden    Patient Care Team: [name], MD as PCP - General (Family Medicine) Yolonda Kida, MD  as Consulting Physician (Cardiology)    Assessment:   This is a routine wellness examination for [place].  Health Screenings  Mammogram -06/16/13 Glaucoma -none Macular degeneration- yes Hearing -demonstrates normal hearing during conversation Hemoglobin A1C -07/12/17 (5.9) TSH-06/09/18 (1.46) Dental- ever 6 months  Social  Alcohol intake -yes, rare Smoking history- never Smokers in home? none Illicit drug use? none Exercise -none Diet -regular Sexually Active -none  Safety  Patient feels safe at home.  Patient does have smoke detectors at home  Patient does wear sunscreen or protective clothing when in direct sunlight  Patient does wear seat belt when riding with others. She does not drive.  Activities of Daily Living Patient has some assistance with household chores. Denies needing assistance with: feeding themselves, getting from bed to chair, getting to the toilet, bathing/showering, dressing, managing money. She does not cook; family prepares meals or she has tv dinners.   Depression Screen Deferred per patient request. Patient plans to discuss with her doctor in a same day appointment.   Fall Screen Patient denies being afraid of falling or falling in the last year.   Memory Screen Patient denies problems with balancing checkbook/bank accounts.  Patient is alert, normal appearance, oriented to person/place/and time. Correctly identified the president of the [place], recall of 2/3 objects, and performing simple calculations.   Immunizations The following Immunizations are up to date: Influenza.    Discussed shingles, pneumonia, and tetanus.   Other Providers Patient Care Team: [name], MD as PCP - General (Family Medicine) Yolonda Kida, MD as Consulting Physician (Cardiology)  Exercise Activities and Dietary recommendations Current Exercise Habits: Home exercise routine, Type of exercise: walking, Time (Minutes): 10, Frequency (Times/Week): 3, Weekly Exercise (Minutes/Week): 30, Intensity: Mild  Goals      Patient Stated   . Healthy lifestyle (pt-stated)     Take medication for pain before strenuous activity        Fall Risk Fall Risk  09/08/2018 06/09/2018 05/06/2018 03/27/2018 01/20/2018  Falls in the past year? 0 0 0 No No  Number falls in past yr: - 0 0 - -  Comment - - - - -  Injury with Fall? - 0 0 - -  Risk for fall due to : - - - - -  Risk for fall due to: Comment - - - - -   Depression Screen PHQ 2/9 Scores 06/09/2018 06/09/2018 03/27/2018 01/09/2018  PHQ - 2 Score 6 6 0 0  PHQ- 9 Score 12 - - -  Exception Documentation - - - -     Cognitive Function     6CIT Screen 09/08/2018 07/26/2017 07/25/2016  What Year? 0 points 0 points 0 points  What month? 0 points 0 points 0 points  What time? 0 points 0 points 0 points  Count back from 20 0 points 0 points 0 points  Months in reverse 0 points 0 points 0 points  Repeat phrase 0 points 0 points -  Total Score 0 0 -    Immunization History  Administered Date(s) Administered  . Influenza Split 04/22/2014  . Influenza, High Dose Seasonal PF 04/25/2016, 05/07/2017, 03/02/2018  . Influenza-Unspecified 05/02/2015   Screening Tests Health Maintenance  Topic Date Due  . TETANUS/TDAP  02/10/1944  . PNA vac Low Risk Adult (1 of 2 - PCV13) 02/11/1992  . INFLUENZA VACCINE  Completed  . DEXA SCAN  Completed      Plan:    End of life planning; Advance aging; Advanced  directives discussed. Copy of current HCPOA/Living Will requested.    I have personally reviewed and noted the following in the  patient's chart:   . Medical and social history . Use of alcohol, tobacco or illicit drugs  . Current medications and supplements . Functional ability and status . Nutritional status . Physical activity . Advanced directives . List of other physicians . Hospitalizations, surgeries, and ER visits in previous 12 months . Vitals . Screenings to include cognitive, depression, and falls . Referrals and appointments  In addition, I have reviewed and discussed with patient certain preventive protocols, quality metrics, and best practice recommendations. A written personalized care plan for preventive services as well as general preventive health recommendations were provided to patient.     Varney Biles, LPN  10/01/7406

## 2018-09-08 NOTE — Patient Instructions (Signed)
Nice to see you. We will check a chest x-ray today. Please discuss your back pain with your pain specialist. If you decide you would like medication for your depression please let us know.  If your depression worsens or you develop thoughts of harming yourself or others please go to the emergency room. If your diarrhea persists over the next 1 to 2 days please contact us or you develop abdominal pain or blood in your stool please be reevaluated.

## 2018-09-08 NOTE — Patient Instructions (Addendum)
  Amanda Soto , Thank you for taking time to come for your Medicare Wellness Visit. I appreciate your ongoing commitment to your health goals. Please review the following plan we discussed and let me know if I can assist you in the future.   These are the goals we discussed: Goals      Patient Stated   . Healthy lifestyle (pt-stated)     Take medication for pain before strenuous activity        This is a list of the screening recommended for you and due dates:  Health Maintenance  Topic Date Due  . Tetanus Vaccine  02/10/1944  . Pneumonia vaccines (1 of 2 - PCV13) 02/11/1992  . Flu Shot  Completed  . DEXA scan (bone density measurement)  Completed   

## 2018-09-08 NOTE — Progress Notes (Unsigned)
Pt states she h

## 2018-09-10 ENCOUNTER — Telehealth: Payer: Self-pay

## 2018-09-10 NOTE — Telephone Encounter (Signed)
Copied from [place] [id number]. Topic: General - Other >> Sep 10, 2018  2:43 PM Marin Olp L wrote: Reason for CRM: X-ray results needed

## 2018-09-11 NOTE — Telephone Encounter (Signed)
I believe so.  I didn't speak directly to pt.  Message was sent from [facility].

## 2018-09-11 NOTE — Telephone Encounter (Signed)
Sent to Amanda Soto just to be clear is the pt calling for their X-ray results?

## 2018-09-12 NOTE — Telephone Encounter (Signed)
Call patient Chest x-ray showed no acute findings.I do not see any mention of pleural effusion on the chest x-ray.  I can see the patient was seen on March 9, and appears a pleural effusion was discussed.  However unsure exact what finding patient was looking for.  Certainly if she is having any new worsening symptoms, let us know.    Amanda Soto

## 2018-09-12 NOTE — Telephone Encounter (Signed)
fyi sonnenberg

## 2018-09-12 NOTE — Telephone Encounter (Signed)
Called and spoke with pt. Pt advised and voiced understanding. Pt stated that she had an MRI done and it show fluid on her lung and this is why Dr. Caryl Bis did the chest x-ray and to rule out cancer. Pt is not c/o any current symptoms.   Sent to [place]

## 2018-09-12 NOTE — Telephone Encounter (Signed)
Pt called stating she would like call back with results on chest Xray. She has called a couple times with no response. Call home #. If no answer try cell # please.

## 2018-09-12 NOTE — Telephone Encounter (Signed)
Sent to [place] could you review so I can advise pt. Thanks

## 2018-09-14 NOTE — Telephone Encounter (Signed)
Noted. CXR was ordered to follow-up on prior CT scan that revealed a pleural effusion. CXR has no evidence of effusion.

## 2018-09-15 DIAGNOSIS — J90 Pleural effusion, not elsewhere classified: Secondary | ICD-10-CM | POA: Insufficient documentation

## 2018-09-15 NOTE — Assessment & Plan Note (Signed)
No apparent recurrence.  She does have occasional diarrhea.  Discussed continuing omeprazole.  If her diarrhea does not improve over the next 1 to 2 days she will contact us.

## 2018-09-15 NOTE — Assessment & Plan Note (Signed)
Patient continues to have some issues with this.  Urged her to follow-up with her pain specialist.

## 2018-09-15 NOTE — Assessment & Plan Note (Signed)
Noted incidentally on prior CT scan.  Will obtain a chest x-ray to evaluate for persistence of this.

## 2018-09-15 NOTE — Progress Notes (Signed)
I have reviewed the above note and agree.  Delania Ferg, M.D.  

## 2018-09-15 NOTE — Assessment & Plan Note (Signed)
Occasional symptoms of depression.  She defers treatment at this time.  Given return precautions.

## 2018-09-15 NOTE — Telephone Encounter (Signed)
Ok to close pt has been informed.

## 2018-09-15 NOTE — Progress Notes (Signed)
[name], MD Phone: [phone]  Amanda Soto is a 85 y.o. female who presents today for follow-up.  Depression/anxiety: Patient notes no anxiety.  She does note some depression though if she stays busy she is okay.  She notes no SI or HI.  Does note having a small glass of wine 2-3 times weekly.  Melena: She notes no issues with this recently.  She does have diarrhea occasionally over the last several days though notes no blood in her stool.  She notes no abdominal pain, nausea, or vomiting.  She did see GI and they placed her on omeprazole.  Pleural effusion: This was noted on prior CT scan.  No cough, wheezing, shortness of breath, travel, or fevers.  Back pain: Patient notes she continues to have some issues with this.  She does follow with the pain specialist and was getting injections.  She notes her back feels weak.  She notes no numbness or weakness in her legs though her legs do feel heavy at times.  No incontinence.  Lidocaine gel is helpful.  Social History   Tobacco Use  Smoking Status Never Smoker  Smokeless Tobacco Never Used     ROS see history of present illness  Objective  Physical Exam Vitals:   09/08/18 1312 09/08/18 1355  BP: 140/60   Pulse: (!) 47 (!) 51  Temp: 97.8 F (36.6 C)   SpO2: 96%     BP Readings from Last 3 Encounters:  09/08/18 140/60  09/08/18 128/70  07/03/18 132/60   Wt Readings from Last 3 Encounters:  09/08/18 119 lb 14.4 oz (54.4 kg)  09/08/18 119 lb 1.9 oz (54 kg)  07/03/18 120 lb 12.8 oz (54.8 kg)    Physical Exam Constitutional:      General: She is not in acute distress.    Appearance: She is not diaphoretic.  Cardiovascular:     Rate and Rhythm: Normal rate and regular rhythm.     Heart sounds: Normal heart sounds.  Pulmonary:     Effort: Pulmonary effort is normal.     Breath sounds: Normal breath sounds.  Abdominal:     General: Bowel sounds are normal. There is no distension.     Palpations: Abdomen is  soft.     Tenderness: There is no abdominal tenderness. There is no guarding or rebound.  Skin:    General: Skin is warm and dry.  Neurological:     Mental Status: She is alert.     Comments: 5/5 strength bilateral quads, hamstrings, plantar flexion, and dorsiflexion, sensation light touch intact bilateral lower extremities      Assessment/Plan: Please see individual problem list.  Pleural effusion Noted incidentally on prior CT scan.  Will obtain a chest x-ray to evaluate for persistence of this.  Anxiety and depression Occasional symptoms of depression.  She defers treatment at this time.  Given return precautions.  Melena No apparent recurrence.  She does have occasional diarrhea.  Discussed continuing omeprazole.  If her diarrhea does not improve over the next 1 to 2 days she will contact us.  Chronic low back pain (Primary Source of Pain) (Right) Patient continues to have some issues with this.  Urged her to follow-up with her pain specialist.   Orders Placed This Encounter  Procedures  . DG Chest 2 View    Standing Status:   Future    Number of Occurrences:   1    Standing Expiration Date:   11/08/2019    Order  Specific Question:   Reason for Exam (SYMPTOM  OR DIAGNOSIS REQUIRED)    Answer:   pleural effusion on prior CT abd/pelvis    Order Specific Question:   Preferred imaging location?    Answer:   [facility] Specific Question:   Radiology Contrast Protocol - do NOT remove file path    Answer:   [email or web address]    Meds ordered this encounter  Medications  . furosemide (LASIX) 20 MG tablet    Sig: Take 1 tablet (20 mg total) by mouth daily.    Dispense:  90 tablet    Refill:  1  . levothyroxine (SYNTHROID) 50 MCG tablet    Sig: Take 1 tablet (50 mcg total) by mouth daily before breakfast.    Dispense:  90 tablet    Refill:  1     [name], MD [place]

## 2018-09-29 ENCOUNTER — Other Ambulatory Visit: Payer: Self-pay

## 2018-09-29 ENCOUNTER — Ambulatory Visit: Payer: Medicare Other | Attending: Nurse Practitioner | Admitting: Nurse Practitioner

## 2018-09-29 DIAGNOSIS — G894 Chronic pain syndrome: Secondary | ICD-10-CM

## 2018-09-29 DIAGNOSIS — G8929 Other chronic pain: Secondary | ICD-10-CM

## 2018-09-29 DIAGNOSIS — M47816 Spondylosis without myelopathy or radiculopathy, lumbar region: Secondary | ICD-10-CM

## 2018-09-29 DIAGNOSIS — M5136 Other intervertebral disc degeneration, lumbar region: Secondary | ICD-10-CM

## 2018-09-29 DIAGNOSIS — M533 Sacrococcygeal disorders, not elsewhere classified: Secondary | ICD-10-CM

## 2018-09-30 NOTE — Progress Notes (Signed)
Virtual Visit via Telephone Note  I connected with Amanda Soto on 09/29/18 at 10:30 AM EDT by telephone and verified that I am speaking with the correct person using two identifiers.   I discussed the limitations, risks, security and privacy concerns of performing an evaluation and management service by telephone and the availability of in person appointments. I also discussed with the patient that there may be a patient responsible charge related to this service. The patient expressed understanding and agreed to proceed.   History of Present Illness: HPI  Reason for Visit: [name] is a 85 y.o. year old, female patient, whose chief complaint of lower back pain .    Today, [name] describes the severity of the right lower back pain a 4/10 . She admits that the pain sometimes feel like it comes from her abdomen. She denies any leg pain. She denies any changes in her pain. The pain does occasionally get worse. She lies down to help ease her pain. She admits that the injections are only effective for a couple of days. She denies any changes in her pain.    Observations/Objective: Physical Exam  Mental status: Alert, oriented x 3 (person, place, & time)       Respiratory: No evidence of acute respiratory distress Vitals: There were no vitals taken for this visit. BMI: Estimated body mass index is 25.72 kg/m as calculated from the following:   Height as of 09/08/18: 4' 9.25" (1.454 m).   Weight as of 09/08/18: 119 lb 14.4 oz (54.4 kg). Ideal: Patient weight not recorded   Assessment and Plan:  Assessment   Status Diagnosis  Controlled Controlled Controlled 1. Lumbar spondylosis   2. Chronic sacroiliac joint pain (Right)   3. DDD (degenerative disc disease), lumbar   4. Chronic pain syndrome      Updated Problems: No problems updated.   Follow Up Instructions: Interventional management options: Planned, scheduled, and/or pending: NOTE:Stop Elaquisfor 3 daysprior  to aprocedure. Not at this time.   Considering: Therapeuticright-sided lumbar facet+right-sidedSIjoint RFA#1   Palliative PRN treatment(s): Palliative right-sided lumbar facet block#4 Palliative right-sided sacroiliac joint block#      I discussed the assessment and treatment plan with the patient. The patient was provided an opportunity to ask questions and all were answered. The patient agreed with the plan and demonstrated an understanding of the instructions.   The patient was advised to call back or seek an in-person evaluation if the symptoms worsen or if the condition fails to improve as anticipated.  I provided 12 minutes of non-face-to-face time during this encounter.   Dionisio David, NP

## 2018-10-01 MED ORDER — TRAMADOL HCL 50 MG PO TABS: 50.0000 mg | ORAL_TABLET | Freq: Four times a day (QID) | ORAL | 5 refills | Status: DC | PRN

## 2018-10-23 ENCOUNTER — Ambulatory Visit: Payer: Medicare Other | Admitting: Podiatry

## 2018-11-19 ENCOUNTER — Telehealth: Payer: Self-pay | Admitting: Family Medicine

## 2018-11-19 NOTE — Telephone Encounter (Signed)
Copied from [place] [id number]. Topic: Quick Communication - Rx Refill/Question >> Nov 19, 2018 12:22 PM Nils Flack wrote: Medication: something for uti ,   Has the patient contacted their pharmacy? Yes.   (Agent: If no, request that the patient contact the pharmacy for the refill.) (Agent: If yes, when and what did the pharmacy advise?)  Preferred Pharmacy (with phone number or street name):[facility] [address] [facility] is [phone] Pt says her back hurts, her urine has a smell  She has had utis in the past and feels the same  Advised she may need visit, nut wanted to see if it could be filled w/o visit   Agent: Please be advised that RX refills may take up to 3 business days. We ask that you follow-up with your pharmacy.

## 2018-11-20 ENCOUNTER — Encounter: Payer: Self-pay | Admitting: Family Medicine

## 2018-11-20 ENCOUNTER — Ambulatory Visit (INDEPENDENT_AMBULATORY_CARE_PROVIDER_SITE_OTHER): Payer: Medicare Other | Admitting: Family Medicine

## 2018-11-20 ENCOUNTER — Other Ambulatory Visit: Payer: Self-pay

## 2018-11-20 ENCOUNTER — Ambulatory Visit: Payer: Medicare Other | Admitting: Podiatry

## 2018-11-20 DIAGNOSIS — N390 Urinary tract infection, site not specified: Secondary | ICD-10-CM | POA: Diagnosis not present

## 2018-11-20 MED ORDER — CEFDINIR 300 MG PO CAPS: 300.0000 mg | ORAL_CAPSULE | Freq: Two times a day (BID) | ORAL | 0 refills | Status: AC

## 2018-11-20 NOTE — Telephone Encounter (Signed)
Called and spoke with pt. Pt has been scheduled for an appt with Lauren pt stated that she is unable to come into the office to do a urine sample.   Sent to Lauren as an FYI   She is your 11 AM today.

## 2018-11-20 NOTE — Progress Notes (Signed)
Patient ID: Amanda Soto, female   DOB: [birth year], 85 y.o.   MRN: [id number]    Virtual Visit via phone Note  This visit type was conducted due to national recommendations for restrictions regarding the COVID-19 pandemic (e.g. social distancing).  This format is felt to be most appropriate for this patient at this time.  All issues noted in this document were discussed and addressed.  No physical exam was performed (except for noted visual exam findings with Video Visits).   I connected with Launa Flight today at 11:00 AM EDT by telephone and verified that I am speaking with the correct person using two identifiers. Location patient: home Location provider: [place] Persons participating in the virtual visit: patient, provider  I discussed the limitations, risks, security and privacy concerns of performing an evaluation and management service by telephone and the availability of in person appointments. I also discussed with the patient that there may be a patient responsible charge related to this service. The patient expressed understanding and agreed to proceed.  HPI:  Patient and I connected via phone due to complaint of burning with urination, urinary pressure and foul-smelling odor with urination.  Patient states symptoms have been present for 2 to 3 days.  Has been trying to increase her water intake.  Denies nausea or vomiting.  Denies fever or chills.  No body aches.  Denies cough, shortness breath or wheezing.  States her appetite has been normal.  ROS: See pertinent positives and negatives per HPI.  Past Medical History:  Diagnosis Date  . Acute postoperative pain 12/17/2016  . Anemia 11/10/2015  . Arthritis   . Atrial fibrillation ([place])   . CHF (congestive heart failure) ([place])   . Chickenpox   . Chronic abdominal pain 01/16/2014  . Closed Colles' fracture 12/17/2016  . Degenerative arthritis of hip 08/19/2014  . Degenerative arthritis of lumbar spine 11/18/2013  .  Depression   . Diverticulitis   . GERD (gastroesophageal reflux disease)   . Heart murmur   . Heart rate slow   . Hyperlipidemia   . Hypertension   . Hypothyroid   . Neuritis or radiculitis due to rupture of lumbar intervertebral disc 11/18/2013  . Skin cancer   . Symptomatic anemia 11/09/2015  . Trochanteric bursitis of right hip 11/18/2013    Past Surgical History:  Procedure Laterality Date  . APPENDECTOMY    . CATARACT EXTRACTION    . PARTIAL HYSTERECTOMY    . TONSILLECTOMY      Family History  Problem Relation Age of Onset  . Stroke Maternal Grandmother   . Breast cancer Mother   . Breast cancer Sister   . Stroke Sister   . Multiple sclerosis Sister   . Heart disease Other        Parent, grandparent, sons  . Hypertension Other        Parent, grandparent   Social History   Tobacco Use  . Smoking status: Never Smoker  . Smokeless tobacco: Never Used  Substance Use Topics  . Alcohol use: Yes    Alcohol/week: 1.0 standard drinks    Types: 1 Glasses of wine per week    Comment: rare    Current Outpatient Medications:  .  acetaminophen (TYLENOL) 325 MG tablet, Take 650 mg by mouth every 6 (six) hours as needed for moderate pain, fever or headache., Disp: , Rfl:  .  Cyanocobalamin (VITAMIN B 12 PO), Take 1,000 mg by mouth daily., Disp: , Rfl:  .  ELIQUIS 2.5 MG TABS tablet, TAKE 1 TABLET BY MOUTH TWICE A DAY, Disp: 60 tablet, Rfl: 11 .  fluticasone (FLONASE) 50 MCG/ACT nasal spray, SPRAY 2 SPRAYS INTO EACH NOSTRIL EVERY DAY, Disp: 16 g, Rfl: 1 .  furosemide (LASIX) 20 MG tablet, Take 1 tablet (20 mg total) by mouth daily., Disp: 90 tablet, Rfl: 1 .  levothyroxine (SYNTHROID) 50 MCG tablet, Take 1 tablet (50 mcg total) by mouth daily before breakfast., Disp: 90 tablet, Rfl: 1 .  Multiple Vitamin (MULTIVITAMIN) capsule, Take 1 capsule by mouth daily. , Disp: , Rfl:  .  Multiple Vitamins-Minerals (OCUVITE EYE HEALTH FORMULA) CAPS, Take 1 capsule by mouth daily., Disp: ,  Rfl:  .  omeprazole (PRILOSEC) 40 MG capsule, Take by mouth., Disp: , Rfl:  .  sotalol (BETAPACE) 80 MG tablet, Take 40 mg by mouth 2 (two) times daily. , Disp: , Rfl:  .  traMADol (ULTRAM) 50 MG tablet, Take 1 tablet (50 mg total) by mouth every 6 (six) hours as needed for severe pain., Disp: 120 tablet, Rfl: 5 .  cefdinir (OMNICEF) 300 MG capsule, Take 1 capsule (300 mg total) by mouth 2 (two) times daily for 7 days., Disp: 14 capsule, Rfl: 0  EXAM:  GENERAL: alert, oriented, sounds well and in no acute distress  LUNGS: She speaking in full sentences. No signs of respiratory distress, breathing rate sounds normal, no obvious gross SOB, gasping or wheezing  PSYCH/NEURO: pleasant and cooperative, no obvious depression or anxiety, speech and thought processing grossly intact  ASSESSMENT AND PLAN:  Discussed the following assessment and plan:  Urinary tract infection without hematuria, site unspecified - Plan: cefdinir (OMNICEF) 300 MG capsule  Patient symptoms do appear consistent with a UTI.  Due to the COVID-19 pandemic as well as the heavy rain that is happening today, patient would prefer not to have to come to office to bring in urine sample.  We will treat with Omnicef twice daily and patient has been advised to increase water intake, avoid excess sugary caffeinated beverages and be diligent with her handwashing.  Advised that if her symptoms persist into next week to call office and let us know.  If symptoms are not helped with current antibiotic, we at that time we will then asked patient to come in and bring urine sample to the office.   I discussed the assessment and treatment plan with the patient. The patient was provided an opportunity to ask questions and all were answered. The patient agreed with the plan and demonstrated an understanding of the instructions.   The patient was advised to call back or seek an in-person evaluation if the symptoms worsen or if the condition fails  to improve as anticipated.  13 minutes spent on phone with patient discussing symptoms and plan of care.   Jodelle Green, FNP

## 2018-11-27 ENCOUNTER — Encounter: Payer: Self-pay | Admitting: Podiatry

## 2018-11-27 ENCOUNTER — Ambulatory Visit: Payer: Medicare Other | Admitting: Podiatry

## 2018-11-27 ENCOUNTER — Other Ambulatory Visit: Payer: Self-pay

## 2018-11-27 DIAGNOSIS — M79676 Pain in unspecified toe(s): Secondary | ICD-10-CM

## 2018-11-27 DIAGNOSIS — B351 Tinea unguium: Secondary | ICD-10-CM

## 2018-11-27 DIAGNOSIS — D689 Coagulation defect, unspecified: Secondary | ICD-10-CM | POA: Diagnosis not present

## 2018-11-27 DIAGNOSIS — M79609 Pain in unspecified limb: Secondary | ICD-10-CM

## 2018-11-27 NOTE — Progress Notes (Signed)
Complaint:  Visit Type: Patient returns to my office for continued preventative foot care services. Complaint: Patient states" my nails have grown long and thick and become painful to walk and wear shoes" . The patient presents for preventative foot care services. No changes to ROS.  Patient is taking eliquiss.  Podiatric Exam: Vascular: dorsalis pedis and posterior tibial pulses are palpable bilateral. Capillary return is immediate. Temperature gradient is WNL. Skin turgor WNL  Sensorium: Normal Semmes Weinstein monofilament test. Normal tactile sensation bilaterally. Nail Exam: Pt has thick disfigured discolored nails with subungual debris noted bilateral entire nail hallux through fifth toenails Ulcer Exam: There is no evidence of ulcer or pre-ulcerative changes or infection. Orthopedic Exam: Muscle tone and strength are WNL. No limitations in general ROM. No crepitus or effusions noted. Foot type and digits show no abnormalities. Bony prominences are unremarkable. Skin: No Porokeratosis. No infection or ulcers  Diagnosis:  Onychomycosis, , Pain in right toe, pain in left toes  Treatment & Plan Procedures and Treatment: Consent by patient was obtained for treatment procedures.   Debridement of mycotic and hypertrophic toenails, 1 through 5 bilateral and clearing of subungual debris. No ulceration, no infection noted.  Return Visit-Office Procedure: Patient instructed to return to the office for a follow up visit 3 months for continued evaluation and treatment.    Gardiner Barefoot DPM

## 2018-12-01 ENCOUNTER — Telehealth: Payer: Self-pay

## 2018-12-01 NOTE — Telephone Encounter (Signed)
Copied from [place] [id number]. Topic: General - Other >> Dec 01, 2018  9:34 AM Leward Quan A wrote: Reason for CRM: Patient called to say that she is not sure if this is related to the UTI she previously had. Pain in left thigh hip area and down her leg. She is asking for a call back please Ph# [phone]

## 2018-12-08 ENCOUNTER — Telehealth: Payer: Self-pay | Admitting: Family Medicine

## 2018-12-08 ENCOUNTER — Telehealth: Payer: Self-pay | Admitting: *Deleted

## 2018-12-08 DIAGNOSIS — R300 Dysuria: Secondary | ICD-10-CM

## 2018-12-08 NOTE — Telephone Encounter (Signed)
Called Pt and told her that a order was put in for a urine culture. Pt stated she dont think she an come in today. I told Pt we were open until 4pm and she dont need an appt to come in when its convenient for her.

## 2018-12-08 NOTE — Telephone Encounter (Signed)
I will put in urine culture order for her

## 2018-12-08 NOTE — Telephone Encounter (Signed)
Spoke with patient and she is having increased pain in her lower back going down her right(?) leg.  I told patient that she would need to schedule an appt with Dr Dossie Arbour for a virtual visit to discuss options.  She would like to do this.  Transferred to front desk for appt.

## 2018-12-08 NOTE — Telephone Encounter (Signed)
Pt called pec Pt was seen by Guse on 11/20/2018 by phone. Pt states that Lauren told her if it did not get better to call office. Pt did call office last week and no one called her. She wants to come in and drop off urine sample

## 2018-12-08 NOTE — Telephone Encounter (Signed)
Pt was seen by Guse on 11/20/2018 by phone. Pt states that Lauren told her if it did not get better to call office. Pt did call office last week and no one called her. She wants to come in and drop off urine sample.

## 2018-12-08 NOTE — Telephone Encounter (Signed)
Perfect thanks  LG

## 2018-12-09 ENCOUNTER — Encounter: Payer: Self-pay | Admitting: Pain Medicine

## 2018-12-10 ENCOUNTER — Other Ambulatory Visit (INDEPENDENT_AMBULATORY_CARE_PROVIDER_SITE_OTHER): Payer: Medicare Other

## 2018-12-10 ENCOUNTER — Other Ambulatory Visit: Payer: Self-pay

## 2018-12-10 ENCOUNTER — Ambulatory Visit: Payer: Medicare Other | Attending: Pain Medicine | Admitting: Pain Medicine

## 2018-12-10 DIAGNOSIS — M545 Low back pain, unspecified: Secondary | ICD-10-CM

## 2018-12-10 DIAGNOSIS — M5136 Other intervertebral disc degeneration, lumbar region: Secondary | ICD-10-CM

## 2018-12-10 DIAGNOSIS — R300 Dysuria: Secondary | ICD-10-CM

## 2018-12-10 DIAGNOSIS — M25552 Pain in left hip: Secondary | ICD-10-CM

## 2018-12-10 DIAGNOSIS — M4310 Spondylolisthesis, site unspecified: Secondary | ICD-10-CM | POA: Diagnosis not present

## 2018-12-10 DIAGNOSIS — G8929 Other chronic pain: Secondary | ICD-10-CM

## 2018-12-10 DIAGNOSIS — M25521 Pain in right elbow: Secondary | ICD-10-CM

## 2018-12-10 DIAGNOSIS — G894 Chronic pain syndrome: Secondary | ICD-10-CM | POA: Diagnosis not present

## 2018-12-10 LAB — URINALYSIS, ROUTINE W REFLEX MICROSCOPIC
Bilirubin Urine: NEGATIVE
Ketones, ur: NEGATIVE
Leukocytes,Ua: NEGATIVE
Nitrite: NEGATIVE
Specific Gravity, Urine: 1.015 (ref 1.000–1.030)
Total Protein, Urine: NEGATIVE
Urine Glucose: NEGATIVE
Urobilinogen, UA: 0.2 (ref 0.0–1.0)
pH: 6 (ref 5.0–8.0)

## 2018-12-10 MED ORDER — PREDNISONE 20 MG PO TABS: ORAL_TABLET | ORAL | 0 refills | Status: AC

## 2018-12-10 NOTE — Progress Notes (Signed)
Pain Management Virtual Encounter Note - Virtual Visit via Telephone Telehealth (real-time audio visits between healthcare provider and patient).   Patient's Phone No. & Preferred Pharmacy:  [phone] (home); [phone] (mobile); (Preferred) [phone] No e-mail address on record  [id number] [place], [place] - [address] [place] [place] [address] Phone: [phone] Fax: [phone]    Pre-screening note:  Our staff contacted Amanda Soto and offered her an "in person", "face-to-face" appointment versus a telephone encounter. She indicated preferring the telephone encounter, at this time.   Reason for Virtual Visit: COVID-19*  Social distancing based on CDC and AMA recommendations.   I contacted Amanda Soto on 12/10/2018 via telephone.      I clearly identified myself as Gaspar Cola, MD. I verified that I was speaking with the correct person using two identifiers (Name: Amanda Soto, and date of birth: Nov 01, 1924).  Advanced Informed Consent I sought verbal advanced consent from Amanda Soto for virtual visit interactions. I informed Amanda Soto of possible security and privacy concerns, risks, and limitations associated with providing "not-in-person" medical evaluation and management services. I also informed Amanda Soto of the availability of "in-person" appointments. Finally, I informed her that there would be a charge for the virtual visit and that she could be  personally, fully or partially, financially responsible for it. Amanda Soto expressed understanding and agreed to proceed.   Historic Elements   Amanda Soto is a 85 y.o. year old, female patient evaluated today after her last encounter by our practice on 12/08/2018. Amanda Soto  has a past medical history of Acute postoperative pain (12/17/2016), Anemia (11/10/2015), Arthritis, Atrial fibrillation ([place]), CHF (congestive heart failure) ([place]), Chickenpox, Chronic abdominal pain (01/16/2014), Closed  Colles' fracture (12/17/2016), Degenerative arthritis of hip (08/19/2014), Degenerative arthritis of lumbar spine (11/18/2013), Depression, Diverticulitis, GERD (gastroesophageal reflux disease), Heart murmur, Heart rate slow, Hyperlipidemia, Hypertension, Hypothyroid, Neuritis or radiculitis due to rupture of lumbar intervertebral disc (11/18/2013), Skin cancer, Symptomatic anemia (11/09/2015), and Trochanteric bursitis of right hip (11/18/2013). She also  has a past surgical history that includes Cataract extraction; Partial hysterectomy; Appendectomy; and Tonsillectomy. Amanda Soto has a current medication list which includes the following prescription(s): acetaminophen, cyanocobalamin, eliquis, fluticasone, furosemide, levothyroxine, multivitamin, ocuvite eye health formula, omeprazole, sotalol, tramadol, and prednisone. She  reports that she has never smoked. She has never used smokeless tobacco. She reports current alcohol use of about 1.0 standard drinks of alcohol per week. She reports that she does not use drugs. Amanda Soto is allergic to hydrocodone; ciprofloxacin; and latex.   HPI  Today, she is being contacted for medication management. This visit today was requested by the patient.  The patient indicates currently doing well on her tramadol.  However, I wrote the prescription so that she can take 1 tablet p.o. every 6 hours and she describes that when she has been doing is taking 2 tablets (100 mg) once a day.  She indicates that she can tolerate it 2 tablets at a time without any type of problems and it does not make her feel "weird" like oxycodone does.  Unfortunately, even when she takes 2 tablets at a time she describes that the relief only lasts for about an hour to 2 hours.  Today I have encouraged her to take the 4 tablets/day to see if she can tolerate the 200 mg/day of tramadol.  If she can, but it still not covering her pain well, then I will go  ahead and prescribe the extended release tramadol.   Today she indicates that she has been experiencing pain in the left hip, which is not the one that usually hurts.  In addition, she has been also experiencing pain in the right elbow.  I will go ahead and send a prescription to the pharmacy for prednisone taper to see if this can take care of this problem.  I will follow-up with her in 1 month to see if the prednisone helps with that hip and elbow pain and also to see if she has been able to tolerate the tramadol well.  If she does, then I will probably prescribe the extended release pill.  Pharmacotherapy Assessment  Analgesic: Tramadol 50 mg 1 tablet p.o. every 6 hours (200 mg/day of tramadol) (the patient should have enough until 04/04/2019) MME/day: 20 mg/day.   Monitoring: Pharmacotherapy: No side-effects or adverse reactions reported. [place] PMP: PDMP not reviewed this encounter.       Compliance: No problems identified. Effectiveness: Clinically acceptable. Plan: Refer to "POC".  Pertinent Labs   SAFETY SCREENING Profile No results found for: SARSCOV2NAA, COVIDSOURCE, STAPHAUREUS, MRSAPCR, HCVAB, HIV, PREGTESTUR Renal Function Lab Results  Component Value Date   BUN 15 07/03/2018   CREATININE 0.77 07/03/2018   BCR 55 (H) 12/06/2017   GFRAA >60 11/10/2015   GFRNONAA >60 11/10/2015   Hepatic Function Lab Results  Component Value Date   AST 22 06/04/2017   ALT 15 06/04/2017   ALBUMIN 4.1 06/04/2017   UDS No results found for: SUMMARY Note: Above Lab results reviewed.  Recent imaging  DG Chest 2 View CLINICAL DATA:  Abnormal findings on CT scan.  EXAM: CHEST - 2 VIEW  COMPARISON:  CT scan June 13, 2018  FINDINGS: The heart size and mediastinal contours are within normal limits. Both lungs are clear. The visualized skeletal structures are unremarkable.  IMPRESSION: No active cardiopulmonary disease.  Electronically Signed   By: Dorise Bullion III M.D   On: 09/08/2018 20:28  Assessment  The primary  encounter diagnosis was Chronic pain syndrome. Diagnoses of Chronic low back pain (Primary Area of Pain) (Right), DDD (degenerative disc disease), lumbar, Grade 1 Anterolisthesis of L4 over L5 (5 mm), Chronic hip pain (Left), and Chronic elbow pain (Right) were also pertinent to this visit.  Plan of Care  I am having Amanda Soto start on predniSONE. I am also having her maintain her acetaminophen, sotalol, multivitamin, Ocuvite Eye Health Formula, Cyanocobalamin (VITAMIN B 12 PO), Eliquis, omeprazole, fluticasone, furosemide, levothyroxine, and traMADol.  Pharmacotherapy (Medications Ordered): Meds ordered this encounter  Medications  . predniSONE (DELTASONE) 20 MG tablet    Sig: Take 60 mg (3 tab) by mouth qd in AM w/ breakfast x 3 days, then 40 mg (2 tab) x 3 day, followed by 20 mg (1 tab) x 3 days.    Dispense:  18 tablet    Refill:  0   Orders:  No orders of the defined types were placed in this encounter.  Follow-up plan:   Return in about 1 month (around 01/09/2019) for (Virtual), (Med-Mgmt).    I discussed the assessment and treatment plan with the patient. The patient was provided an opportunity to ask questions and all were answered. The patient agreed with the plan and demonstrated an understanding of the instructions.  Patient advised to call back or seek an in-person evaluation if the symptoms or condition worsens.  Total duration of non-face-to-face encounter: 15 minutes.  Note by: Kathlen Brunswick  Dossie Arbour, MD Date: 12/10/2018; Time: 11:44 AM  Note: This dictation was prepared with Dragon dictation. Any transcriptional errors that may result from this process are unintentional.  Disclaimer:  * Given the special circumstances of the COVID-19 pandemic, the federal government has announced that the Office for Civil Rights (OCR) will exercise its enforcement discretion and will not impose penalties on physicians using telehealth in the event of noncompliance with regulatory  requirements under the [place] (HIPAA) in connection with the good faith provision of telehealth during the AXENM-07 national public health emergency. ([place])

## 2018-12-12 LAB — URINE CULTURE
MICRO NUMBER:: 555946
SPECIMEN QUALITY:: ADEQUATE

## 2019-01-06 ENCOUNTER — Encounter: Payer: Self-pay | Admitting: Pain Medicine

## 2019-01-06 ENCOUNTER — Telehealth: Payer: Self-pay

## 2019-01-06 NOTE — Telephone Encounter (Signed)
Copied from [place] [id number]. Topic: General - Other >> Jan 06, 2019  4:24 PM Leward Quan A wrote: Reason for CRM: Patient called to say that she would like to have her visit on the phone she states that she does not see very well so a virtual visit would not be good for her also asking for a call back tomorrow before 10 am or after 11 am because she have another phone appointment tomorrow.Ph# [phone]

## 2019-01-06 NOTE — Telephone Encounter (Signed)
Called and spoke to patient.  Confirmed phone appointment on 01/09/19 @ 1:15 pm.

## 2019-01-07 ENCOUNTER — Ambulatory Visit: Payer: Medicare Other | Attending: Pain Medicine | Admitting: Pain Medicine

## 2019-01-07 ENCOUNTER — Other Ambulatory Visit: Payer: Self-pay

## 2019-01-07 DIAGNOSIS — M545 Low back pain: Secondary | ICD-10-CM | POA: Diagnosis not present

## 2019-01-07 DIAGNOSIS — G894 Chronic pain syndrome: Secondary | ICD-10-CM | POA: Diagnosis not present

## 2019-01-07 DIAGNOSIS — M5136 Other intervertebral disc degeneration, lumbar region: Secondary | ICD-10-CM | POA: Diagnosis not present

## 2019-01-07 DIAGNOSIS — Z79899 Other long term (current) drug therapy: Secondary | ICD-10-CM

## 2019-01-07 DIAGNOSIS — M899 Disorder of bone, unspecified: Secondary | ICD-10-CM

## 2019-01-07 DIAGNOSIS — Z789 Other specified health status: Secondary | ICD-10-CM | POA: Insufficient documentation

## 2019-01-07 DIAGNOSIS — M792 Neuralgia and neuritis, unspecified: Secondary | ICD-10-CM | POA: Insufficient documentation

## 2019-01-07 DIAGNOSIS — M4310 Spondylolisthesis, site unspecified: Secondary | ICD-10-CM | POA: Diagnosis not present

## 2019-01-07 DIAGNOSIS — G8929 Other chronic pain: Secondary | ICD-10-CM

## 2019-01-07 MED ORDER — PREGABALIN 25 MG PO CAPS: 25.0000 mg | ORAL_CAPSULE | Freq: Every day | ORAL | 0 refills | Status: DC

## 2019-01-07 NOTE — Progress Notes (Signed)
Pain Management Virtual Encounter Note - Virtual Visit via Telephone Telehealth (real-time audio visits between healthcare provider and patient).   Patient's Phone No. & Preferred Pharmacy:  [phone] (home); [phone] (mobile); (Preferred) [phone] No e-mail address on record  [id number] [place], [place] - [address] [place] [address] Phone: [phone] Fax: [phone]    Pre-screening note:  Our staff contacted Amanda Soto and offered her an "in person", "face-to-face" appointment versus a telephone encounter. She indicated preferring the telephone encounter, at this time.   Reason for Virtual Visit: COVID-19*  Social distancing based on CDC and AMA recommendations.   I contacted Amanda Soto on 01/07/2019 via telephone.      I clearly identified myself as Gaspar Cola, MD. I verified that I was speaking with the correct person using two identifiers (Name: Amanda Soto, and date of birth: [birth year]).  Advanced Informed Consent I sought verbal advanced consent from Amanda Soto for virtual visit interactions. I informed Amanda Soto of possible security and privacy concerns, risks, and limitations associated with providing "not-in-person" medical evaluation and management services. I also informed Amanda Soto of the availability of "in-person" appointments. Finally, I informed her that there would be a charge for the virtual visit and that she could be  personally, fully or partially, financially responsible for it. Amanda Soto expressed understanding and agreed to proceed.   Historic Elements   Amanda Soto is a 85 y.o. year old, female patient evaluated today after her last encounter by our practice on 12/10/2018. Amanda Soto  has a past medical history of Acute postoperative pain (12/17/2016), Anemia (11/10/2015), Arthritis, Atrial fibrillation ([place]), CHF (congestive heart failure) ([place]), Chickenpox, Chronic abdominal pain (01/16/2014), Closed  Colles' fracture (12/17/2016), Degenerative arthritis of hip (08/19/2014), Degenerative arthritis of lumbar spine (11/18/2013), Depression, Diverticulitis, GERD (gastroesophageal reflux disease), Heart murmur, Heart rate slow, Hyperlipidemia, Hypertension, Hypothyroid, Neuritis or radiculitis due to rupture of lumbar intervertebral disc (11/18/2013), Skin cancer, Symptomatic anemia (11/09/2015), and Trochanteric bursitis of right hip (11/18/2013). She also  has a past surgical history that includes Cataract extraction; Partial hysterectomy; Appendectomy; and Tonsillectomy. Amanda Soto has a current medication list which includes the following prescription(s): acetaminophen, cyanocobalamin, eliquis, fluticasone, furosemide, levothyroxine, multivitamin, ocuvite eye health formula, omeprazole, sotalol, tramadol, and pregabalin. She  reports that she has never smoked. She has never used smokeless tobacco. She reports current alcohol use of about 1.0 standard drinks of alcohol per week. She reports that she does not use drugs. Amanda Soto is allergic to hydrocodone; ciprofloxacin; and latex.   HPI  Today, she is being contacted for worsening of previously known (established) problem.  The patient indicates that the prednisone that we gave her for her hip pain did help and that is now gone.  However, she has been experiencing more of the right low back pain.  Reviewing her chart, I can see that she had her right-sided lumbar facet radiofrequency on 03/27/2018.  It is entirely possible that this may be wearing off.  However, the patient indicates that although we can see a decrease in the amount of blocks that she has required to keep her back pain under control, she thinks that the radiofrequency might have not helped her that much.  Based on my records, there is some evidence to the contrary making me suspect that he may be an issue with her memory.  At age 70 this would be understandable.  In any case, the patient  indicates  never having tried any gabapentin or pregabalin and therefore I have decided to go ahead with a trial of the pregabalin (Lyrica) at 25 mg at bedtime.  I have decided to go with the Lyrica instead of the gabapentin because of the cleaner side effect and adverse profile seen with the Lyrica in comparison to the Neurontin.  Since she is 85 years old, I am concerned of her having more cognitive impairment and or balance problems that may lead to a fall and a subsequent fracture.  This is her same reason why even though her pain is supposed to be worse during the day when she stands up and walks, we have asked her to start the Lyrica at bedtime so that some of its effects may be carried over to the day, but if he gets sleepy, she can take advantage of that since she is taken it at bedtime.  I will call the patient back in about 30 days to assess her Lyrica trial.  Pharmacotherapy Assessment  Analgesic: Tramadol 50 mg, 1 tablet PO q 6 hrs (200 mg/day of tramadol) (Has enough until 04/04/19) MME/day: 20 mg/day.   Monitoring: Pharmacotherapy: No side-effects or adverse reactions reported.  PMP: PDMP reviewed during this encounter.       Compliance: No problems identified. Effectiveness: Clinically acceptable. Plan: Refer to "POC".  Pertinent Labs   SAFETY SCREENING Profile No results found for: SARSCOV2NAA, COVIDSOURCE, STAPHAUREUS, MRSAPCR, HCVAB, HIV, PREGTESTUR Renal Function Lab Results  Component Value Date   BUN 15 07/03/2018   CREATININE 0.77 07/03/2018   BCR 55 (H) 12/06/2017   GFRAA >60 11/10/2015   GFRNONAA >60 11/10/2015   Hepatic Function Lab Results  Component Value Date   AST 22 06/04/2017   ALT 15 06/04/2017   ALBUMIN 4.1 06/04/2017   UDS No results found for: SUMMARY Note: Above Lab results reviewed.  Recent imaging  DG Chest 2 View CLINICAL DATA:  Abnormal findings on CT scan.  EXAM: CHEST - 2 VIEW  COMPARISON:  CT scan June 13, 2018  FINDINGS: The  heart size and mediastinal contours are within normal limits. Both lungs are clear. The visualized skeletal structures are unremarkable.  IMPRESSION: No active cardiopulmonary disease.  Electronically Signed   By: Dorise Bullion III M.D   On: 09/08/2018 20:28  Assessment  The primary encounter diagnosis was Chronic pain syndrome. Diagnoses of Chronic low back pain (Primary Area of Pain) (Right), DDD (degenerative disc disease), lumbar, Grade 1 Anterolisthesis of L4 over L5 (5 mm), Pharmacologic therapy, Disorder of skeletal system, Problems influencing health status, and Neurogenic pain were also pertinent to this visit.  Plan of Care  I am having Amanda Soto start on pregabalin. I am also having her maintain her acetaminophen, sotalol, multivitamin, Ocuvite Eye Health Formula, Cyanocobalamin (VITAMIN B 12 PO), Eliquis, omeprazole, fluticasone, furosemide, levothyroxine, and traMADol.  Pharmacotherapy (Medications Ordered): Meds ordered this encounter  Medications  . pregabalin (LYRICA) 25 MG capsule    Sig: Take 1 capsule (25 mg total) by mouth at bedtime.    Dispense:  30 capsule    Refill:  0    Fill one day early if pharmacy is closed on scheduled refill date. May substitute for generic if available.   Orders:  Orders Placed This Encounter  Procedures  . Comp. Metabolic Panel (12)    With GFR. Indications: Chronic Pain Syndrome (G89.4) & Pharmacotherapy (Z36.644)    Order Specific Question:   Has the patient fasted?  Answer:   No    Order Specific Question:   CC Results    Answer:   PCP-NURSE [[id number]]  . Magnesium    Indication: Pharmacologic therapy (T53.202)    Order Specific Question:   CC Results    Answer:   PCP-NURSE [[id number]]  . Vitamin B12    Indication: Pharmacologic therapy (Y61.683).    Order Specific Question:   CC Results    Answer:   PCP-NURSE [[id number]]  . Sedimentation rate    Indication: Disorder of skeletal system (M89.9)    Order Specific  Question:   CC Results    Answer:   PCP-NURSE [[id number]]  . 25-Hydroxyvitamin D Lcms D2+D3    Indication: Disorder of skeletal system (M89.9).    Order Specific Question:   CC Results    Answer:   PCP-NURSE [[id number]]  . C-reactive protein    Indication: Problems influencing health status (Z78.9)    Order Specific Question:   CC Results    Answer:   PCP-NURSE [[id number]]   Follow-up plan:   Return in about 1 month (around 02/07/2019) for (VV), E/M (MM) to evaluate Lyrica trial..     Considering: NOTE:Stop Elaquisfor 3 daysprior to aprocedure.    Palliative PRN treatment(s): Palliative/Therapeuticright-sided lumbar facet #2+right-sidedSIjoint RFA#1 Palliative right-sided lumbar facet block#4 Palliative right-sided sacroiliac joint block#4    Recent Visits Date Type Provider Dept  12/10/18 Office Visit Milinda Pointer, MD [facility]  Showing recent visits within past 90 days and meeting all other requirements   Today's Visits Date Type Provider Dept  01/07/19 Office Visit Milinda Pointer, MD [facility]  Showing today's visits and meeting all other requirements   Future Appointments Date Type Provider Dept  03/23/19 Appointment Milinda Pointer, MD [facility]  Showing future appointments within next 90 days and meeting all other requirements   I discussed the assessment and treatment plan with the patient. The patient was provided an opportunity to ask questions and all were answered. The patient agreed with the plan and demonstrated an understanding of the instructions.  Patient advised to call back or seek an in-person evaluation if the symptoms or condition worsens.  Total duration of non-face-to-face encounter: 14 minutes.  Note by: Gaspar Cola, MD Date: 01/07/2019; Time: 12:06 PM  Note: This dictation was prepared with Dragon dictation. Any transcriptional errors that may result from this process are  unintentional.  Disclaimer:  * Given the special circumstances of the COVID-19 pandemic, the federal government has announced that the Office for Civil Rights (OCR) will exercise its enforcement discretion and will not impose penalties on physicians using telehealth in the event of noncompliance with regulatory requirements under the [place] (HIPAA) in connection with the good faith provision of telehealth during the LPNPY-05 national public health emergency. ([place])

## 2019-01-09 ENCOUNTER — Ambulatory Visit: Payer: Medicare Other | Admitting: Family Medicine

## 2019-01-19 ENCOUNTER — Ambulatory Visit (INDEPENDENT_AMBULATORY_CARE_PROVIDER_SITE_OTHER): Payer: Medicare Other | Admitting: Family Medicine

## 2019-01-19 ENCOUNTER — Other Ambulatory Visit: Payer: Self-pay

## 2019-01-19 DIAGNOSIS — R21 Rash and other nonspecific skin eruption: Secondary | ICD-10-CM

## 2019-01-19 DIAGNOSIS — E039 Hypothyroidism, unspecified: Secondary | ICD-10-CM

## 2019-01-19 DIAGNOSIS — G8929 Other chronic pain: Secondary | ICD-10-CM

## 2019-01-19 DIAGNOSIS — M545 Low back pain: Secondary | ICD-10-CM | POA: Diagnosis not present

## 2019-01-19 DIAGNOSIS — J90 Pleural effusion, not elsewhere classified: Secondary | ICD-10-CM

## 2019-01-19 DIAGNOSIS — I4891 Unspecified atrial fibrillation: Secondary | ICD-10-CM

## 2019-01-19 NOTE — Progress Notes (Signed)
Virtual Visit via telephone Note  This visit type was conducted due to national recommendations for restrictions regarding the COVID-19 pandemic (e.g. social distancing).  This format is felt to be most appropriate for this patient at this time.  All issues noted in this document were discussed and addressed.  No physical exam was performed (except for noted visual exam findings with Video Visits).   I connected with Amanda Soto today at  4:30 PM EDT by telephone and verified that I am speaking with the correct person using two identifiers. Location patient: home Location provider: work Persons participating in the virtual visit: patient, provider  I discussed the limitations, risks, security and privacy concerns of performing an evaluation and management service by telephone and the availability of in person appointments. I also discussed with the patient that there may be a patient responsible charge related to this service. The patient expressed understanding and agreed to proceed.  Interactive audio and video telecommunications were attempted between this provider and patient, however failed, due to patient having technical difficulties OR patient did not have access to video capability.  We continued and completed visit with audio only.  Reason for visit: follow-up  HPI: HYPOTHYROIDISM Disease Monitoring Weight changes: unsure, though no outward weight gain    Skin Changes: occasional rough spots on skin, saw derm and had cryotherapy Heat/Cold intolerance: some cold intolerance  Medication Monitoring Compliance:  Taking synthroid   Last TSH:   Lab Results  Component Value Date   TSH 1.46 06/09/2018   Back pain: This is a chronic issue.  She feels weak in her back.  She is following with pain management and they have placed her on Lyrica.  She notes this has been beneficial so far.  It appears they ordered some lab work though the patient notes this was not discussed with her.  She  was getting back injections though has not gotten one in quite some time.  A. fib: No palpitations, chest pain, shortness of breath, or bleeding issues.  She is on Eliquis and sotalol.  She follows with cardiology.   ROS: See pertinent positives and negatives per HPI.  Past Medical History:  Diagnosis Date  . Acute postoperative pain 12/17/2016  . Anemia 11/10/2015  . Arthritis   . Atrial fibrillation ([place])   . CHF (congestive heart failure) ([place])   . Chickenpox   . Chronic abdominal pain 01/16/2014  . Closed Colles' fracture 12/17/2016  . Degenerative arthritis of hip 08/19/2014  . Degenerative arthritis of lumbar spine 11/18/2013  . Depression   . Diverticulitis   . GERD (gastroesophageal reflux disease)   . Heart murmur   . Heart rate slow   . Hyperlipidemia   . Hypertension   . Hypothyroid   . Neuritis or radiculitis due to rupture of lumbar intervertebral disc 11/18/2013  . Skin cancer   . Symptomatic anemia 11/09/2015  . Trochanteric bursitis of right hip 11/18/2013    Past Surgical History:  Procedure Laterality Date  . APPENDECTOMY    . CATARACT EXTRACTION    . PARTIAL HYSTERECTOMY    . TONSILLECTOMY      Family History  Problem Relation Age of Onset  . Stroke Maternal Grandmother   . Breast cancer Mother   . Breast cancer Sister   . Stroke Sister   . Multiple sclerosis Sister   . Heart disease Other        Parent, grandparent, sons  . Hypertension Other  Parent, grandparent    SOCIAL HX: Non-smoker.   Current Outpatient Medications:  .  acetaminophen (TYLENOL) 325 MG tablet, Take 500 mg by mouth every 6 (six) hours as needed for moderate pain, fever or headache. , Disp: , Rfl:  .  Cyanocobalamin (VITAMIN B 12 PO), Take 1,000 mg by mouth daily., Disp: , Rfl:  .  ELIQUIS 2.5 MG TABS tablet, TAKE 1 TABLET BY MOUTH TWICE A DAY, Disp: 60 tablet, Rfl: 11 .  fluticasone (FLONASE) 50 MCG/ACT nasal spray, SPRAY 2 SPRAYS INTO EACH NOSTRIL EVERY DAY, Disp: 16  g, Rfl: 1 .  furosemide (LASIX) 20 MG tablet, Take 1 tablet (20 mg total) by mouth daily., Disp: 90 tablet, Rfl: 1 .  levothyroxine (SYNTHROID) 50 MCG tablet, Take 1 tablet (50 mcg total) by mouth daily before breakfast., Disp: 90 tablet, Rfl: 1 .  Multiple Vitamin (MULTIVITAMIN) capsule, Take 1 capsule by mouth daily. , Disp: , Rfl:  .  Multiple Vitamins-Minerals (OCUVITE EYE HEALTH FORMULA) CAPS, Take 1 capsule by mouth daily., Disp: , Rfl:  .  omeprazole (PRILOSEC) 40 MG capsule, Take by mouth., Disp: , Rfl:  .  pregabalin (LYRICA) 25 MG capsule, Take 1 capsule (25 mg total) by mouth at bedtime., Disp: 30 capsule, Rfl: 0 .  sotalol (BETAPACE) 80 MG tablet, Take 40 mg by mouth 2 (two) times daily. , Disp: , Rfl:  .  traMADol (ULTRAM) 50 MG tablet, Take 1 tablet (50 mg total) by mouth every 6 (six) hours as needed for severe pain., Disp: 120 tablet, Rfl: 5  EXAM: This is a telehealth telephone visit and thus no physical exam was completed.  ASSESSMENT AND PLAN:  Discussed the following assessment and plan:  Atrial fibrillation (HCC) Asymptomatic.  She will continue her current medications through cardiology.  Pleural effusion Follow-up chest x-ray without pleural effusion.  Hypothyroidism Due for TSH.  We will determine if she needs labs from pain management and then get her TSH scheduled.  Chronic low back pain (Primary Area of Pain) (Right) Chronic issue.  This is what she describes as her weakness.  This has been improving with use of Lyrica.  We will check with her pain specialist to determine if the labs that were ordered need to be drawn.  Rash Based on description and cryotherapy through dermatology these could be actinic keratoses.  Advised her to follow-up with dermatology to evaluate her skin again.   Social distancing precautions and sick precautions given regarding COVID-19.   I discussed the assessment and treatment plan with the patient. The patient was provided an  opportunity to ask questions and all were answered. The patient agreed with the plan and demonstrated an understanding of the instructions.   The patient was advised to call back or seek an in-person evaluation if the symptoms worsen or if the condition fails to improve as anticipated.  I provided 15 minutes of non-face-to-face time during this encounter.   [name], MD

## 2019-01-20 DIAGNOSIS — R21 Rash and other nonspecific skin eruption: Secondary | ICD-10-CM | POA: Insufficient documentation

## 2019-01-20 NOTE — Assessment & Plan Note (Addendum)
Based on description and cryotherapy through dermatology these could be actinic keratoses.  Advised her to follow-up with dermatology to evaluate her skin again.

## 2019-01-20 NOTE — Assessment & Plan Note (Signed)
Due for TSH.  We will determine if she needs labs from pain management and then get her TSH scheduled.

## 2019-01-20 NOTE — Assessment & Plan Note (Signed)
Follow-up chest x-ray without pleural effusion.

## 2019-01-20 NOTE — Assessment & Plan Note (Signed)
Asymptomatic.  She will continue her current medications through cardiology.

## 2019-01-20 NOTE — Assessment & Plan Note (Addendum)
Chronic issue.  This is what she describes as her weakness.  This has been improving with use of Lyrica.  We will check with her pain specialist to determine if the labs that were ordered need to be drawn.

## 2019-02-03 ENCOUNTER — Other Ambulatory Visit: Payer: Self-pay | Admitting: Pain Medicine

## 2019-02-03 DIAGNOSIS — M792 Neuralgia and neuritis, unspecified: Secondary | ICD-10-CM

## 2019-02-05 NOTE — Telephone Encounter (Signed)
Patient needs refill on Lyrica, states it is helping. She was not given a 1 month appt. To follow up on Lyrica. Next appt is in Sept.

## 2019-02-16 ENCOUNTER — Telehealth: Payer: Self-pay | Admitting: Pain Medicine

## 2019-02-16 ENCOUNTER — Other Ambulatory Visit: Payer: Self-pay | Admitting: *Deleted

## 2019-02-16 ENCOUNTER — Other Ambulatory Visit: Payer: Self-pay | Admitting: Pain Medicine

## 2019-02-16 DIAGNOSIS — M792 Neuralgia and neuritis, unspecified: Secondary | ICD-10-CM

## 2019-02-16 MED ORDER — PREGABALIN 25 MG PO CAPS: 25.0000 mg | ORAL_CAPSULE | Freq: Every day | ORAL | 0 refills | Status: DC

## 2019-02-16 NOTE — Telephone Encounter (Signed)
Patient lvmail stating she is out of her Lyrica. She only received a one month script and did not get appt for 1 month med mgmt. Her scheduled appt is 03-23-19 no earlier appts are available. Please ask Dr. Dossie Arbour to call in enough meds to last until next appt.

## 2019-02-16 NOTE — Telephone Encounter (Signed)
Dr. Dossie Arbour consulted and he e-scribed her Lyrica a 1/day until her next e-visit. Patient called and informed.

## 2019-02-17 DIAGNOSIS — R001 Bradycardia, unspecified: Secondary | ICD-10-CM | POA: Diagnosis not present

## 2019-02-17 DIAGNOSIS — I480 Paroxysmal atrial fibrillation: Secondary | ICD-10-CM | POA: Diagnosis not present

## 2019-02-17 DIAGNOSIS — I499 Cardiac arrhythmia, unspecified: Secondary | ICD-10-CM | POA: Diagnosis not present

## 2019-02-17 DIAGNOSIS — R5383 Other fatigue: Secondary | ICD-10-CM | POA: Diagnosis not present

## 2019-02-25 DIAGNOSIS — H353132 Nonexudative age-related macular degeneration, bilateral, intermediate dry stage: Secondary | ICD-10-CM | POA: Diagnosis not present

## 2019-02-27 ENCOUNTER — Other Ambulatory Visit: Payer: Self-pay | Admitting: Pain Medicine

## 2019-03-02 ENCOUNTER — Encounter: Payer: Self-pay | Admitting: Podiatry

## 2019-03-02 ENCOUNTER — Other Ambulatory Visit: Payer: Self-pay

## 2019-03-02 ENCOUNTER — Ambulatory Visit (INDEPENDENT_AMBULATORY_CARE_PROVIDER_SITE_OTHER): Payer: Medicare Other | Admitting: Podiatry

## 2019-03-02 DIAGNOSIS — B351 Tinea unguium: Secondary | ICD-10-CM

## 2019-03-02 DIAGNOSIS — D689 Coagulation defect, unspecified: Secondary | ICD-10-CM

## 2019-03-02 DIAGNOSIS — M79609 Pain in unspecified limb: Secondary | ICD-10-CM

## 2019-03-02 DIAGNOSIS — M79676 Pain in unspecified toe(s): Secondary | ICD-10-CM

## 2019-03-02 NOTE — Progress Notes (Signed)
Complaint:  Visit Type: Patient returns to my office for continued preventative foot care services. Complaint: Patient states" my nails have grown long and thick and become painful to walk and wear shoes" . The patient presents for preventative foot care services. No changes to ROS.  Patient is taking eliquiss.  Podiatric Exam: Vascular: dorsalis pedis and posterior tibial pulses are palpable bilateral. Capillary return is immediate. Temperature gradient is WNL. Skin turgor WNL  Sensorium: Normal Semmes Weinstein monofilament test. Normal tactile sensation bilaterally. Nail Exam: Pt has thick disfigured discolored nails with subungual debris noted bilateral entire nail hallux through fifth toenails Ulcer Exam: There is no evidence of ulcer or pre-ulcerative changes or infection. Orthopedic Exam: Muscle tone and strength are WNL. No limitations in general ROM. No crepitus or effusions noted. Foot type and digits show no abnormalities. Bony prominences are unremarkable. Skin: No Porokeratosis. No infection or ulcers  Diagnosis:  Onychomycosis, , Pain in right toe, pain in left toes  Treatment & Plan Procedures and Treatment: Consent by patient was obtained for treatment procedures.   Debridement of mycotic and hypertrophic toenails, 1 through 5 bilateral and clearing of subungual debris. No ulceration, no infection noted. Iatrogenic second toe left foot .  Patient was given 10 week appointment to help prevent the nail from ingrowing in future. Return Visit-Office Procedure: Patient instructed to return to the office for a follow up visit 10 weeks  for continued evaluation and treatment.    Gardiner Barefoot DPM

## 2019-03-04 ENCOUNTER — Other Ambulatory Visit: Payer: Self-pay

## 2019-03-04 ENCOUNTER — Encounter: Payer: Self-pay | Admitting: Family Medicine

## 2019-03-04 ENCOUNTER — Ambulatory Visit (INDEPENDENT_AMBULATORY_CARE_PROVIDER_SITE_OTHER): Payer: Medicare Other | Admitting: Family Medicine

## 2019-03-04 VITALS — Ht 59.5 in | Wt 115.0 lb

## 2019-03-04 DIAGNOSIS — M545 Low back pain, unspecified: Secondary | ICD-10-CM

## 2019-03-04 DIAGNOSIS — E039 Hypothyroidism, unspecified: Secondary | ICD-10-CM | POA: Diagnosis not present

## 2019-03-04 DIAGNOSIS — G8929 Other chronic pain: Secondary | ICD-10-CM

## 2019-03-04 DIAGNOSIS — F329 Major depressive disorder, single episode, unspecified: Secondary | ICD-10-CM

## 2019-03-04 DIAGNOSIS — F419 Anxiety disorder, unspecified: Secondary | ICD-10-CM | POA: Diagnosis not present

## 2019-03-04 DIAGNOSIS — F32A Depression, unspecified: Secondary | ICD-10-CM

## 2019-03-04 MED ORDER — APIXABAN 2.5 MG PO TABS: 2.5000 mg | ORAL_TABLET | Freq: Two times a day (BID) | ORAL | 11 refills | Status: DC

## 2019-03-04 NOTE — Assessment & Plan Note (Signed)
This is a chronic issue though has acutely worsened over the last month.  No known injury.  She has been having some intermittent bowel incontinence issues.  I recommended that she have an MRI to evaluate.  She notes she cannot do this today though could do it later this week.  Given that the bowel issues are intermittent and she reports no other potential neurological issues in her lower extremities I think it is reasonable to complete the MRI later this week.  This has been ordered and a message sent to our referral coordinator to get this scheduled.  I advised that if the patient has any recurrent issues with bowel incontinence or her back pain worsen she needs to go to the emergency room.

## 2019-03-04 NOTE — Progress Notes (Signed)
Virtual Visit via telephone Note  This visit type was conducted due to national recommendations for restrictions regarding the COVID-19 pandemic (e.g. social distancing).  This format is felt to be most appropriate for this patient at this time.  All issues noted in this document were discussed and addressed.  No physical exam was performed (except for noted visual exam findings with Video Visits).   I connected with Amanda Soto today at  1:15 PM EDT by telephone and verified that I am speaking with the correct person using two identifiers. Location patient: home Location provider: work  Persons participating in the virtual visit: patient, provider  I discussed the limitations, risks, security and privacy concerns of performing an evaluation and management service by telephone and the availability of in person appointments. I also discussed with the patient that there may be a patient responsible charge related to this service. The patient expressed understanding and agreed to proceed.  Interactive audio and video telecommunications were attempted between this provider and patient, however failed, due to patient having technical difficulties OR patient did not have access to video capability.  We continued and completed visit with audio only.  Reason for visit: follow-up  HPI: Depression: Patient notes this is stable.  It is related to having to stay home and having to deal with her back issues.  No SI.  She declines medication and therapy referral.  Back pain: This continues to be an issue.  It has worsened since her last visit.  She notes no injury.  The Lyrica helped initially though she ran out and now she is back on it and it is not beneficial.  The pain is in her right low back with radiation to her thigh.  Tramadol does help some.  No numbness or weakness.  She notes no urine incontinence though does note over a number of months having had some issues with bowel incontinence where she  will get up to go to the bathroom and have already had a bowel movement.  She will have normal bowel movements in between these episodes.  It does not happen consistently.  Last occurred yesterday.  No saddle anesthesia.   ROS: See pertinent positives and negatives per HPI.  Past Medical History:  Diagnosis Date  . Acute postoperative pain 12/17/2016  . Anemia 11/10/2015  . Arthritis   . Atrial fibrillation ([place])   . CHF (congestive heart failure) ([place])   . Chickenpox   . Chronic abdominal pain 01/16/2014  . Closed Colles' fracture 12/17/2016  . Degenerative arthritis of hip 08/19/2014  . Degenerative arthritis of lumbar spine 11/18/2013  . Depression   . Diverticulitis   . GERD (gastroesophageal reflux disease)   . Heart murmur   . Heart rate slow   . Hyperlipidemia   . Hypertension   . Hypothyroid   . Neuritis or radiculitis due to rupture of lumbar intervertebral disc 11/18/2013  . Skin cancer   . Symptomatic anemia 11/09/2015  . Trochanteric bursitis of right hip 11/18/2013    Past Surgical History:  Procedure Laterality Date  . APPENDECTOMY    . CATARACT EXTRACTION    . PARTIAL HYSTERECTOMY    . TONSILLECTOMY      Family History  Problem Relation Age of Onset  . Stroke Maternal Grandmother   . Breast cancer Mother   . Breast cancer Sister   . Stroke Sister   . Multiple sclerosis Sister   . Heart disease Other  Parent, grandparent, sons  . Hypertension Other        Parent, grandparent    SOCIAL HX: Non-smoker.   Current Outpatient Medications:  .  acetaminophen (TYLENOL) 325 MG tablet, Take 500 mg by mouth every 6 (six) hours as needed for moderate pain, fever or headache. , Disp: , Rfl:  .  apixaban (ELIQUIS) 2.5 MG TABS tablet, Take 1 tablet (2.5 mg total) by mouth 2 (two) times daily., Disp: 60 tablet, Rfl: 11 .  fluticasone (FLONASE) 50 MCG/ACT nasal spray, SPRAY 2 SPRAYS INTO EACH NOSTRIL EVERY DAY, Disp: , Rfl:  .  furosemide (LASIX) 20 MG tablet,  Take 1 tablet (20 mg total) by mouth daily., Disp: 90 tablet, Rfl: 1 .  levothyroxine (SYNTHROID) 50 MCG tablet, Take 1 tablet (50 mcg total) by mouth daily before breakfast., Disp: 90 tablet, Rfl: 1 .  Multiple Vitamin (MULTIVITAMIN) capsule, Take 1 capsule by mouth daily. , Disp: , Rfl:  .  Multiple Vitamins-Minerals (OCUVITE EYE HEALTH FORMULA) CAPS, Take 1 capsule by mouth daily., Disp: , Rfl:  .  omeprazole (PRILOSEC) 40 MG capsule, Take by mouth., Disp: , Rfl:  .  pregabalin (LYRICA) 25 MG capsule, Take 1 capsule (25 mg total) by mouth at bedtime., Disp: 47 capsule, Rfl: 0 .  sotalol (BETAPACE) 80 MG tablet, Take 40 mg by mouth 2 (two) times daily. , Disp: , Rfl:  .  traMADol (ULTRAM) 50 MG tablet, Take by mouth., Disp: , Rfl:  .  vitamin B-12 (CYANOCOBALAMIN) 1000 MCG tablet, Take by mouth., Disp: , Rfl:   EXAM: This is a telehealth telephone visit and thus no physical exam was completed.  ASSESSMENT AND PLAN:  Discussed the following assessment and plan:  Chronic low back pain (Primary Area of Pain) (Right) This is a chronic issue though has acutely worsened over the last month.  No known injury.  She has been having some intermittent bowel incontinence issues.  I recommended that she have an MRI to evaluate.  She notes she cannot do this today though could do it later this week.  Given that the bowel issues are intermittent and she reports no other potential neurological issues in her lower extremities I think it is reasonable to complete the MRI later this week.  This has been ordered and a message sent to our referral coordinator to get this scheduled.  I advised that if the patient has any recurrent issues with bowel incontinence or her back pain worsen she needs to go to the emergency room.  Anxiety and depression Chronic issue.  Offered medication such as Cymbalta as this may help with the depression and the back pain.  She declined this option.  I also offered referral to therapy  though she declined this as well.  She opted to monitor.    I discussed the assessment and treatment plan with the patient. The patient was provided an opportunity to ask questions and all were answered. The patient agreed with the plan and demonstrated an understanding of the instructions.   The patient was advised to call back or seek an in-person evaluation if the symptoms worsen or if the condition fails to improve as anticipated.  I provided 19 minutes of non-face-to-face time during this encounter.   [name], MD

## 2019-03-04 NOTE — Assessment & Plan Note (Signed)
Chronic issue.  Offered medication such as Cymbalta as this may help with the depression and the back pain.  She declined this option.  I also offered referral to therapy though she declined this as well.  She opted to monitor.

## 2019-03-05 ENCOUNTER — Emergency Department: Payer: Medicare Other

## 2019-03-05 ENCOUNTER — Other Ambulatory Visit: Payer: Self-pay

## 2019-03-05 ENCOUNTER — Telehealth: Payer: Self-pay

## 2019-03-05 ENCOUNTER — Emergency Department
Admission: EM | Admit: 2019-03-05 | Discharge: 2019-03-05 | Disposition: A | Discharge status: Home / Self Care | Payer: Medicare Other | Attending: Emergency Medicine | Admitting: Emergency Medicine

## 2019-03-05 DIAGNOSIS — Y9389 Activity, other specified: Secondary | ICD-10-CM | POA: Insufficient documentation

## 2019-03-05 DIAGNOSIS — I509 Heart failure, unspecified: Secondary | ICD-10-CM | POA: Insufficient documentation

## 2019-03-05 DIAGNOSIS — Z79899 Other long term (current) drug therapy: Secondary | ICD-10-CM | POA: Insufficient documentation

## 2019-03-05 DIAGNOSIS — E039 Hypothyroidism, unspecified: Secondary | ICD-10-CM | POA: Insufficient documentation

## 2019-03-05 DIAGNOSIS — W19XXXA Unspecified fall, initial encounter: Secondary | ICD-10-CM

## 2019-03-05 DIAGNOSIS — W1789XA Other fall from one level to another, initial encounter: Secondary | ICD-10-CM | POA: Insufficient documentation

## 2019-03-05 DIAGNOSIS — Y999 Unspecified external cause status: Secondary | ICD-10-CM | POA: Diagnosis not present

## 2019-03-05 DIAGNOSIS — Y9289 Other specified places as the place of occurrence of the external cause: Secondary | ICD-10-CM | POA: Diagnosis not present

## 2019-03-05 DIAGNOSIS — Z7901 Long term (current) use of anticoagulants: Secondary | ICD-10-CM | POA: Diagnosis not present

## 2019-03-05 DIAGNOSIS — S0003XA Contusion of scalp, initial encounter: Secondary | ICD-10-CM

## 2019-03-05 DIAGNOSIS — I110 Hypertensive heart disease with heart failure: Secondary | ICD-10-CM | POA: Insufficient documentation

## 2019-03-05 DIAGNOSIS — R001 Bradycardia, unspecified: Secondary | ICD-10-CM | POA: Diagnosis not present

## 2019-03-05 DIAGNOSIS — S0990XA Unspecified injury of head, initial encounter: Secondary | ICD-10-CM | POA: Diagnosis not present

## 2019-03-05 DIAGNOSIS — S199XXA Unspecified injury of neck, initial encounter: Secondary | ICD-10-CM | POA: Diagnosis not present

## 2019-03-05 MED ORDER — ACETAMINOPHEN 500 MG PO TABS
1000.0000 mg | ORAL_TABLET | Freq: Once | ORAL | Status: AC
  Administered 2019-03-05: 1000 mg via ORAL
  Filled 2019-03-05: qty 2

## 2019-03-05 NOTE — Discharge Instructions (Signed)

## 2019-03-05 NOTE — ED Provider Notes (Signed)
[facility] Emergency Department Provider Note  ____________________________________________  Time seen: Approximately 2:48 AM  I have reviewed the triage vital signs and the nursing notes.   HISTORY  Chief Complaint Fall   HPI Amanda Soto is a 85 y.o. female with several chronic medical problems as listed below including atrial fibrillation on Eliquis who presents for evaluation of fall and head trauma.  Patient reports that she was coming inside from her porch when she misstepped and fell.  She hit the back of her head onto the floor.  She denies LOC.  She denies headache, neck pain, extremity pain, back pain, chest pain, shortness of breath.  She reports that the fall was mechanical in nature with no presyncopal symptoms.   Past Medical History:  Diagnosis Date   Acute postoperative pain 12/17/2016   Anemia 11/10/2015   Arthritis    Atrial fibrillation (HCC)    CHF (congestive heart failure) ([place])    Chickenpox    Chronic abdominal pain 01/16/2014   Closed Colles' fracture 12/17/2016   Degenerative arthritis of hip 08/19/2014   Degenerative arthritis of lumbar spine 11/18/2013   Depression    Diverticulitis    GERD (gastroesophageal reflux disease)    Heart murmur    Heart rate slow    Hyperlipidemia    Hypertension    Hypothyroid    Neuritis or radiculitis due to rupture of lumbar intervertebral disc 11/18/2013   Skin cancer    Symptomatic anemia 11/09/2015   Trochanteric bursitis of right hip 11/18/2013    Patient Active Problem List   Diagnosis Date Noted   Rash 01/20/2019   Disorder of skeletal system 01/07/2019   Problems influencing health status 01/07/2019   Neurogenic pain 01/07/2019   Chronic hip pain (Left) 12/10/2018   Chronic elbow pain (Right) 12/10/2018   Allergic rhinitis 06/10/2018   Melena 04/10/2018   Headache 04/10/2018   Spondylosis without myelopathy or radiculopathy, lumbosacral region  01/09/2018   Other specified dorsopathies, sacral and sacrococcygeal region 01/09/2018   History of allergy to latex 01/09/2018   Actinic keratoses 06/04/2017   Chronic anticoagulation (Eliquis) 04/24/2017   DDD (degenerative disc disease), lumbar 04/09/2017   Chronic sacroiliac joint pain (Right) 04/04/2017   History of fall 02/11/2017   Chronic fatigue 07/25/2016   Chronic pain syndrome 05/29/2016   Macular degeneration 01/20/2016   Lumbar spondylosis 11/15/2015   Scoliosis of lumbar spine (concave to right side) 11/15/2015   Lumbar facet arthropathy 11/15/2015   Osteoarthritis of hip (Right) 11/15/2015   Thoracic paracentral T7-8 disc protrusion 11/15/2015   Cardiac murmur 11/14/2015   HLD (hyperlipidemia) 11/14/2015   MI (mitral incompetence) 11/14/2015   OP (osteoporosis) 11/14/2015   Chronic low back pain (Primary Area of Pain) (Right) 11/14/2015   Lumbar facet syndrome (Right) 11/14/2015   Grade 1 Anterolisthesis of L4 over L5 (5 mm) 11/14/2015   Hypothyroidism 11/07/2015   Lightheadedness 11/07/2015   Elevated glucose 09/07/2015   Exertional shortness of breath 08/25/2015   Pharmacologic therapy [id number]   Chronic systolic heart failure ([place]) 11/09/2014   Bradycardia 11/09/2014   Atrial fibrillation ([place]) 11/09/2014   HTN (hypertension) 11/09/2014   Anxiety and depression 01/16/2014   Recurrent major depressive disorder, in partial remission ([place]) 01/16/2014   Degeneration of intervertebral disc of lumbar region 11/18/2013   H/O neoplasm 10/21/2012   History of nonmelanoma skin cancer 10/21/2012    Past Surgical History:  Procedure Laterality Date   APPENDECTOMY     CATARACT  EXTRACTION     PARTIAL HYSTERECTOMY     TONSILLECTOMY      Prior to Admission medications   Medication Sig Start Date End Date Taking? Authorizing Provider  acetaminophen (TYLENOL) 325 MG tablet Take 500 mg by mouth every 6 (six) hours as needed  for moderate pain, fever or headache.     [provider]  apixaban (ELIQUIS) 2.5 MG TABS tablet Take 1 tablet (2.5 mg total) by mouth 2 (two) times daily. 03/04/19   [name], MD  fluticasone (FLONASE) 50 MCG/ACT nasal spray SPRAY 2 SPRAYS INTO EACH NOSTRIL EVERY DAY 07/28/18   [provider]  furosemide (LASIX) 20 MG tablet Take 1 tablet (20 mg total) by mouth daily. 09/08/18   [name], MD  levothyroxine (SYNTHROID) 50 MCG tablet Take 1 tablet (50 mcg total) by mouth daily before breakfast. 09/08/18   [name], MD  Multiple Vitamin (MULTIVITAMIN) capsule Take 1 capsule by mouth daily.     [provider]  Multiple Vitamins-Minerals ([place]) CAPS Take 1 capsule by mouth daily.    [provider]  omeprazole (PRILOSEC) 40 MG capsule Take by mouth. 06/05/18   [provider]  pregabalin (LYRICA) 25 MG capsule Take 1 capsule (25 mg total) by mouth at bedtime. 02/16/19 04/04/19  Milinda Pointer, MD  sotalol (BETAPACE) 80 MG tablet Take 40 mg by mouth 2 (two) times daily.     [provider]  traMADol (ULTRAM) 50 MG tablet Take by mouth. 10/06/18 04/04/19  [provider]  vitamin B-12 (CYANOCOBALAMIN) 1000 MCG tablet Take by mouth.    [provider]    Allergies Hydrocodone, Ciprofloxacin, and Latex  Family History  Problem Relation Age of Onset   Stroke Maternal Grandmother    Breast cancer Mother    Breast cancer Sister    Stroke Sister    Multiple sclerosis Sister    Heart disease Other        Parent, grandparent, sons   Hypertension Other        Parent, grandparent    Social History Social History   Tobacco Use   Smoking status: Never Smoker   Smokeless tobacco: Never Used  Substance Use Topics   Alcohol use: Yes    Alcohol/week: 1.0 standard drinks    Types: 1 Glasses of wine per week    Comment: rare   Drug use: No    Review of  Systems  Constitutional: Negative for fever. Eyes: Negative for visual changes. ENT: Negative for facial injury or neck injury Cardiovascular: Negative for chest injury. Respiratory: Negative for shortness of breath. Negative for chest wall injury. Gastrointestinal: Negative for abdominal pain or injury. Genitourinary: Negative for dysuria. Musculoskeletal: Negative for back injury, negative for arm or leg pain. Skin: Negative for laceration/abrasions. Neurological: + head injury.   ____________________________________________   PHYSICAL EXAM:  VITAL SIGNS: ED Triage Vitals  Enc Vitals Group     BP 03/05/19 0031 (!) 149/59     Pulse Rate 03/05/19 0031 (!) 50     Resp 03/05/19 0031 18     Temp 03/05/19 0031 97.8 F (36.6 C)     Temp Source 03/05/19 0031 Oral     SpO2 03/05/19 0031 93 %     Weight 03/05/19 0035 115 lb (52.2 kg)     Height 03/05/19 0035 4\' 11"  (1.499 m)     Head Circumference --      Peak Flow --  Pain Score 03/05/19 0035 0     Pain Loc --      Pain Edu? --      Excl. in [place]? --     Full spinal precautions maintained throughout the trauma exam. Constitutional: Alert and oriented. No acute distress. Does not appear intoxicated. HEENT Head: Normocephalic, occipital hematoma Face: No facial bony tenderness. Stable midface Ears: No hemotympanum bilaterally. No Battle sign Eyes: No eye injury. PERRL. No raccoon eyes Nose: Nontender. No epistaxis. No rhinorrhea Mouth/Throat: Mucous membranes are moist. No oropharyngeal blood. No dental injury. Airway patent without stridor. Normal voice. Neck: no C-collar. Diffuse paraspinal cspine tenderness, no midline c-spine tenderness.  Cardiovascular: Normal rate, regular rhythm. Normal and symmetric distal pulses are present in all extremities. Pulmonary/Chest: Chest wall is stable and nontender to palpation/compression. Normal respiratory effort. Breath sounds are normal. No crepitus.  Abdominal: Soft, nontender,  non distended. Musculoskeletal: Nontender with normal full range of motion in all extremities. No deformities. No thoracic or lumbar midline spinal tenderness. Pelvis is stable. Skin: Skin is warm, dry and intact. No abrasions or contutions. Psychiatric: Speech and behavior are appropriate. Neurological: Normal speech and language. Moves all extremities to command. No gross focal neurologic deficits are appreciated.  Glascow Coma Score: 4 - Opens eyes on own 6 - Follows simple motor commands 5 - Alert and oriented GCS: 15   ____________________________________________   LABS (all labs ordered are listed, but only abnormal results are displayed)  Labs Reviewed - No data to display ____________________________________________  EKG  ED ECG REPORT I, Rudene Re, the attending physician, personally viewed and interpreted this ECG.  Sinus bradycardia, rate of 53, left bundle branch block, no concordant ST elevations.  Unchanged from prior. ____________________________________________  RADIOLOGY  I have personally reviewed the images performed during this visit and I agree with the Radiologist's read.   Interpretation by Radiologist:  Ct Head Wo Contrast  Result Date: 03/05/2019 CLINICAL DATA:  85 year old post mechanical fall at home. No loss of consciousness. On anticoagulation. Head trauma, ataxia EXAM: CT HEAD WITHOUT CONTRAST TECHNIQUE: Contiguous axial images were obtained from the base of the skull through the vertex without intravenous contrast. COMPARISON:  Head CT 04/12/2014 FINDINGS: Brain: No evidence of acute infarction, hemorrhage, hydrocephalus, extra-axial collection or mass lesion/mass effect. Brain volume is normal for age. Ovoid 9 mm calcification along the inferior left tentorium is unchanged from prior exam and may represent coarse dural calcification or calcified meningioma, regardless is unchanged with no associated mass effect. Vascular: Atherosclerosis of  skullbase vasculature without hyperdense vessel or abnormal calcification. Skull: No fracture or focal lesion. Sinuses/Orbits: Paranasal sinuses and mastoid air cells are clear. The visualized orbits are unremarkable. Bilateral cataract resection. Other: None. IMPRESSION: No acute intracranial abnormality. No skull fracture. Electronically Signed   By: Keith Rake M.D.   On: 03/05/2019 01:28   Ct Cervical Spine Wo Contrast  Result Date: 03/05/2019 CLINICAL DATA:  C-spine trauma, mechanical fall, posterior bruising EXAM: CT CERVICAL SPINE WITHOUT CONTRAST TECHNIQUE: Multidetector CT imaging of the cervical spine was performed without intravenous contrast. Multiplanar CT image reconstructions were also generated. COMPARISON:  Same-day head CT FINDINGS: Alignment: Focal reversal of the normal cervical lordosis centered at C4 with mild degenerative retrolisthesis of C4 on C5 of approximately 3 mm. No traumatic listhesis. No abnormal facet widening. Fusion of the left C2-3 facet. Normal alignment of the craniocervical and atlantoaxial articulations. Skull base and vertebrae: No acute fracture. There is calcified pannus posterior to the dens. No  concerning focal osseous lesion. Soft tissues and spinal canal: No pre or paravertebral fluid or swelling. No visible canal hematoma. Disc levels: Multilevel intervertebral disc height loss with spondylitic endplate changes. Multilevel disc osteophyte complexes are most pronounced C3-C6 with mild canal stenosis at C4-5 and C5-6. Mild to moderate foraminal narrowing is noted bilaterally at C5-6 and on the right at C4-5. Upper chest: Biapical scarring. Some mosaic attenuation may be related to small airways disease and/or atelectasis. Atheromatous plaque is present within the aortic arch and great vessels. Additional calcifications are present in the common carotids and bifurcations. Other: None. IMPRESSION: 1. No acute cervical spine fracture. 2. Multilevel degenerative  changes of the cervical spine as described above. 3. Calcified pannus formation posterior to the dens, nonspecific but can reflect CPPD or rheumatoid arthropathy. 4. Aortic Atherosclerosis (ICD10-I70.0). Additional calcific atherosclerosis in the great vessels and cervical carotids. Electronically Signed   By: Lovena Le M.D.   On: 03/05/2019 03:32     ____________________________________________   PROCEDURES  Procedure(s) performed: None Procedures Critical Care performed:  None ____________________________________________   INITIAL IMPRESSION / ASSESSMENT AND PLAN / ED COURSE  85 y.o. female with several chronic medical problems as listed below including atrial fibrillation on Eliquis who presents for evaluation of mechanical fall and head trauma.  Patient is alert and oriented x3 with a GCS of 15, neurologically intact, she has an occipital hematoma with no laceration, no signs or symptoms of basilar skull fracture, no midline CT no spine tenderness.  No other injuries.  CT head and cervical spine showing no acute traumatic injuries.  EKG showing no dysrhythmias.  Patient received Tylenol for pain.  Will discharge home on supportive care and follow-up with primary care doctor.  Discussed my standard return precautions for any signs of delayed head bleed.       As part of my medical decision making, I reviewed the following data within the [place] notes reviewed and incorporated, EKG interpreted , Radiograph reviewed , Notes from prior ED visits and [place] Controlled Substance Database   Patient was evaluated in Emergency Department today for the symptoms described in the history of present illness. Patient was evaluated in the context of the global COVID-19 pandemic, which necessitated consideration that the patient might be at risk for infection with the SARS-CoV-2 virus that causes COVID-19. Institutional protocols and algorithms that pertain to the evaluation  of patients at risk for COVID-19 are in a state of rapid change based on information released by regulatory bodies including the CDC and federal and state organizations. These policies and algorithms were followed during the patient's care in the ED.   ____________________________________________   FINAL CLINICAL IMPRESSION(S) / ED DIAGNOSES   Final diagnoses:  Fall, initial encounter  Hematoma of scalp, initial encounter      NEW MEDICATIONS STARTED DURING THIS VISIT:  ED Discharge Orders    None       Note:  This document was prepared using Dragon voice recognition software and may include unintentional dictation errors.    Alfred Levins, [place], MD 03/05/19 [phone]

## 2019-03-05 NOTE — Telephone Encounter (Signed)
Copied from [place] [id number]. Topic: General - Other >> Mar 05, 2019  3:25 PM Virl Axe D wrote: Reason for CRM: Pt stated she went to ED at [facility] yesterday for a fall. CT was done. Pt stated she is doing fine. Pt scheduled for labs on 03/11/19. Declined HFU available on 03/13/19 as she did not want to have to come back to the office. Please advise.

## 2019-03-05 NOTE — ED Triage Notes (Signed)
Mechanical fall at home. No LOC. Pt has a small bruise to the back of the head. Pt is on elequis.

## 2019-03-06 ENCOUNTER — Ambulatory Visit
Admission: RE | Admit: 2019-03-06 | Discharge: 2019-03-06 | Disposition: A | Discharge status: Home / Self Care | Payer: Medicare Other | Source: Ambulatory Visit | Attending: Family Medicine | Admitting: Family Medicine

## 2019-03-06 ENCOUNTER — Other Ambulatory Visit: Payer: Self-pay

## 2019-03-06 DIAGNOSIS — G8929 Other chronic pain: Secondary | ICD-10-CM | POA: Diagnosis not present

## 2019-03-06 DIAGNOSIS — M545 Low back pain: Secondary | ICD-10-CM | POA: Diagnosis not present

## 2019-03-06 DIAGNOSIS — M5126 Other intervertebral disc displacement, lumbar region: Secondary | ICD-10-CM | POA: Diagnosis not present

## 2019-03-06 DIAGNOSIS — M47816 Spondylosis without myelopathy or radiculopathy, lumbar region: Secondary | ICD-10-CM | POA: Diagnosis not present

## 2019-03-07 NOTE — Telephone Encounter (Signed)
Noted.  I think it is reasonable based on her negative work-up in the ED.  If she develops any issues from her fall with development of headaches, pain at the site where she hit her head, or any neurological symptoms she needs to be reevaluated.  Thanks.

## 2019-03-10 ENCOUNTER — Telehealth: Payer: Self-pay

## 2019-03-10 NOTE — Telephone Encounter (Signed)
Copied from [place] [id number]. Topic: General - Inquiry >> Mar 10, 2019  2:39 PM Alanda Slim E wrote: Reason for CRM: Pt would like a call to go over MRI results /please advise

## 2019-03-11 ENCOUNTER — Other Ambulatory Visit: Payer: Self-pay

## 2019-03-11 ENCOUNTER — Other Ambulatory Visit (INDEPENDENT_AMBULATORY_CARE_PROVIDER_SITE_OTHER): Payer: Medicare Other

## 2019-03-11 DIAGNOSIS — E039 Hypothyroidism, unspecified: Secondary | ICD-10-CM | POA: Diagnosis not present

## 2019-03-11 NOTE — Telephone Encounter (Signed)
Spoke with pt & gave results. See result note

## 2019-03-11 NOTE — Addendum Note (Signed)
Addended by: Leeanne Rio on: 03/11/2019 02:49 PM   Modules accepted: Orders

## 2019-03-12 ENCOUNTER — Encounter: Payer: Self-pay | Admitting: Family Medicine

## 2019-03-12 LAB — TSH: TSH: 2.93 u[IU]/mL (ref 0.35–4.50)

## 2019-03-13 ENCOUNTER — Telehealth: Payer: Self-pay | Admitting: Family Medicine

## 2019-03-13 NOTE — Telephone Encounter (Signed)
Patient is calling for the result of her MRI. She was told that it was a fracture of her tail bone. And she would like to know if is was a new fracture or an old fracture.   Patient is sore from the fall. She does not know what to do. Patient states that the tradol takes the pain away for away. But the soreness is still there. Please advise CB- [phone]

## 2019-03-14 ENCOUNTER — Other Ambulatory Visit: Payer: Self-pay | Admitting: Family Medicine

## 2019-03-14 DIAGNOSIS — S3210XA Unspecified fracture of sacrum, initial encounter for closed fracture: Secondary | ICD-10-CM

## 2019-03-17 NOTE — Telephone Encounter (Signed)
Spoke to pt to let her know that referral was sent to [facility] in [place]. She is asking if the fracture that was seen is an old fracture or new fracture. Pt cb [phone]

## 2019-03-17 NOTE — Telephone Encounter (Signed)
The radiologist reported the finding as subacute.  This means that it did not occur in the immediate past though did not occur in the distant past either.  It is new compared to her prior MRI.

## 2019-03-18 NOTE — Telephone Encounter (Signed)
Amanda Soto, Please call pt with information from Dr. Caryl Bis below. Thank you!

## 2019-03-18 NOTE — Telephone Encounter (Signed)
I called and informed the patient of her MRI results patient understood and stated she has not been called yet from [facility], I informed her if they did not call her by the end of the week to let us know.  [name],cma

## 2019-03-19 ENCOUNTER — Other Ambulatory Visit: Payer: Self-pay | Admitting: Family Medicine

## 2019-03-19 ENCOUNTER — Encounter: Payer: Self-pay | Admitting: Pain Medicine

## 2019-03-22 NOTE — Progress Notes (Signed)
Pain Management Virtual Encounter Note - Virtual Visit via Telephone Telehealth (real-time audio visits between healthcare provider and patient).   Patient's Phone No. & Preferred Pharmacy:  [phone] (home); [phone] (mobile); (Preferred) [phone] No e-mail address on record  [id number] [place], [place] - [address] [place] [address] Phone: [phone] Fax: [phone]    Pre-screening note:  Our staff contacted Amanda Soto and offered her an "in person", "face-to-face" appointment versus a telephone encounter. She indicated preferring the telephone encounter, at this time.   Reason for Virtual Visit: COVID-19*  Social distancing based on CDC and AMA recommendations.   I contacted Amanda Soto on 03/23/2019 via telephone.      I clearly identified myself as Gaspar Cola, MD. I verified that I was speaking with the correct person using two identifiers (Name: Amanda Soto, and date of birth: 1925-02-19).  Advanced Informed Consent I sought verbal advanced consent from [name] for virtual visit interactions. I informed Ms. Myint of possible security and privacy concerns, risks, and limitations associated with providing "not-in-person" medical evaluation and management services. I also informed Ms. Lose of the availability of "in-person" appointments. Finally, I informed her that there would be a charge for the virtual visit and that she could be  personally, fully or partially, financially responsible for it. [name] expressed understanding and agreed to proceed.   Historic Elements   [name] is a 85 y.o. year old, female patient evaluated today after her last encounter by our practice on 02/16/2019. Amanda Soto  has a past medical history of Acute postoperative pain (12/17/2016), Anemia (11/10/2015), Arthritis, Atrial fibrillation ([place]), CHF (congestive heart failure) ([place]), Chickenpox, Chronic abdominal pain (01/16/2014), Closed  Colles' fracture (12/17/2016), Degenerative arthritis of hip (08/19/2014), Degenerative arthritis of lumbar spine (11/18/2013), Depression, Diverticulitis, GERD (gastroesophageal reflux disease), Heart murmur, Heart rate slow, Hyperlipidemia, Hypertension, Hypothyroid, Neuritis or radiculitis due to rupture of lumbar intervertebral disc (11/18/2013), Skin cancer, Symptomatic anemia (11/09/2015), and Trochanteric bursitis of right hip (11/18/2013). She also  has a past surgical history that includes Cataract extraction; Partial hysterectomy; Appendectomy; and Tonsillectomy. Amanda Soto has a current medication list which includes the following prescription(s): acetaminophen, apixaban, fluticasone, furosemide, multivitamin, ocuvite eye health formula, omeprazole, pregabalin, sotalol, synthroid, tramadol, and vitamin b-12. She  reports that she has never smoked. She has never used smokeless tobacco. She reports current alcohol use of about 1.0 standard drinks of alcohol per week. She reports that she does not use drugs. Amanda Soto is allergic to hydrocodone; ciprofloxacin; and latex.   HPI  Today, she is being contacted for medication management.  The patient indicates that on 03/05/2019 she had a fall and she hit her tailbone.  She is scheduled to have an MRI on Friday but she indicates that she was told that she had a sacrococcygeal fracture.  She is pending to see the orthopedic surgeons for that.  I have informed the patient that if there is nothing that they can do for her, we can certainly provide her with some local on anesthetic and steroid injections in the area to improve some of this coccygodynia.  The patient indicates having stopped taking the Lyrica because apparently she was thinking that that was the reason why she fell.  However she is only taking 25 mg at bedtime and although it is possible, it is also unlikely that that dose is affecting her to the point where she is unable to  walk.  In any case, I told  her that she could hold on taking it until we can figure things out for her.  Pharmacotherapy Assessment  Analgesic: Tramadol 50 mg, 1 tablet PO q 6 hrs (200 mg/day of tramadol) (Has enough until 04/04/19) MME/day: 20 mg/day.   Monitoring: Pharmacotherapy: No side-effects or adverse reactions reported. [place] PMP: PDMP reviewed during this encounter.       Compliance: No problems identified. Effectiveness: Clinically acceptable. Plan: Refer to "POC".  UDS: No results found for: SUMMARY Laboratory Chemistry Profile (12 mo)  Renal: 07/03/2018: BUN 15; Creatinine, Ser 0.77  Lab Results  Component Value Date   GFR 74.62 07/03/2018   GFRAA >60 11/10/2015   GFRNONAA >60 11/10/2015   Hepatic: No results found for requested labs within last 8760 hours. Lab Results  Component Value Date   AST 22 06/04/2017   ALT 15 06/04/2017   Other: No results found for requested labs within last 8760 hours. Note: Above Lab results reviewed.  Imaging  Last 90 days:  Ct Head Wo Contrast  Result Date: 03/05/2019 CLINICAL DATA:  85 year old post mechanical fall at home. No loss of consciousness. On anticoagulation. Head trauma, ataxia EXAM: CT HEAD WITHOUT CONTRAST TECHNIQUE: Contiguous axial images were obtained from the base of the skull through the vertex without intravenous contrast. COMPARISON:  Head CT 04/12/2014 FINDINGS: Brain: No evidence of acute infarction, hemorrhage, hydrocephalus, extra-axial collection or mass lesion/mass effect. Brain volume is normal for age. Ovoid 9 mm calcification along the inferior left tentorium is unchanged from prior exam and may represent coarse dural calcification or calcified meningioma, regardless is unchanged with no associated mass effect. Vascular: Atherosclerosis of skullbase vasculature without hyperdense vessel or abnormal calcification. Skull: No fracture or focal lesion. Sinuses/Orbits: Paranasal sinuses and mastoid air cells are clear. The visualized orbits are  unremarkable. Bilateral cataract resection. Other: None. IMPRESSION: No acute intracranial abnormality. No skull fracture. Electronically Signed   By: Keith Rake M.D.   On: 03/05/2019 01:28   Ct Cervical Spine Wo Contrast  Result Date: 03/05/2019 CLINICAL DATA:  C-spine trauma, mechanical fall, posterior bruising EXAM: CT CERVICAL SPINE WITHOUT CONTRAST TECHNIQUE: Multidetector CT imaging of the cervical spine was performed without intravenous contrast. Multiplanar CT image reconstructions were also generated. COMPARISON:  Same-day head CT FINDINGS: Alignment: Focal reversal of the normal cervical lordosis centered at C4 with mild degenerative retrolisthesis of C4 on C5 of approximately 3 mm. No traumatic listhesis. No abnormal facet widening. Fusion of the left C2-3 facet. Normal alignment of the craniocervical and atlantoaxial articulations. Skull base and vertebrae: No acute fracture. There is calcified pannus posterior to the dens. No concerning focal osseous lesion. Soft tissues and spinal canal: No pre or paravertebral fluid or swelling. No visible canal hematoma. Disc levels: Multilevel intervertebral disc height loss with spondylitic endplate changes. Multilevel disc osteophyte complexes are most pronounced C3-C6 with mild canal stenosis at C4-5 and C5-6. Mild to moderate foraminal narrowing is noted bilaterally at C5-6 and on the right at C4-5. Upper chest: Biapical scarring. Some mosaic attenuation may be related to small airways disease and/or atelectasis. Atheromatous plaque is present within the aortic arch and great vessels. Additional calcifications are present in the common carotids and bifurcations. Other: None. IMPRESSION: 1. No acute cervical spine fracture. 2. Multilevel degenerative changes of the cervical spine as described above. 3. Calcified pannus formation posterior to the dens, nonspecific but can reflect CPPD or rheumatoid arthropathy. 4. Aortic Atherosclerosis (ICD10-I70.0).  Additional calcific atherosclerosis  in the great vessels and cervical carotids. Electronically Signed   By: Lovena Le M.D.   On: 03/05/2019 03:32   Mr Lumbar Spine Wo Contrast  Result Date: 03/06/2019 CLINICAL DATA:  Chronic back pain. EXAM: MRI LUMBAR SPINE WITHOUT CONTRAST TECHNIQUE: Multiplanar, multisequence MR imaging of the lumbar spine was performed. No intravenous contrast was administered. COMPARISON:  MR lumbar spine dated August 15, 2017. FINDINGS: Segmentation:  Standard. Alignment:  Unchanged trace anterolisthesis from L3-L4 to L5-S1. Vertebrae:  No fracture, evidence of discitis, or bone lesion. Conus medullaris and cauda equina: Conus extends to the L1 level. Conus and cauda equina appear normal. Paraspinal and other soft tissues: New marrow edema and deformity of the S3 vertebral body, suspicious for subacute nondisplaced fracture. Otherwise negative. Disc levels: T12-L1 to L2-L3: No significant disc bulge or herniation. No stenosis. L3-L4: Unchanged minimal disc bulging and mild bilateral facet arthropathy. No stenosis. L4-L5: Unchanged minimal disc bulging and moderate bilateral facet arthropathy. No stenosis. L5-S1: Unchanged small shallow broad-based posterior disc protrusion and right subarticular annular fissure. Unchanged mild bilateral facet arthropathy. No stenosis. IMPRESSION: 1. Unchanged facet predominant lower lumbar spondylosis. No stenosis or impingement. 2. New marrow edema and deformity of the S3 vertebral body, suspicious for subacute nondisplaced fracture. Electronically Signed   By: Titus Dubin M.D.   On: 03/06/2019 11:14    Assessment  The primary encounter diagnosis was Chronic pain syndrome. Diagnoses of Chronic low back pain (Primary Area of Pain) (Right), Grade 1 Anterolisthesis of L4 over L5 (5 mm), Neurogenic pain, Pharmacologic therapy, Disorder of skeletal system, Problems influencing health status, Coccygodynia, and Traumatic fracture of coccyx, sequela  were also pertinent to this visit.  Plan of Care  I have changed Mirtie J. Kramme's traMADol. I am also having her maintain her acetaminophen, sotalol, multivitamin, Ocuvite Eye Health Formula, omeprazole, furosemide, vitamin B-12, fluticasone, apixaban, Synthroid, and pregabalin.  Pharmacotherapy (Medications Ordered): Meds ordered this encounter  Medications  . pregabalin (LYRICA) 25 MG capsule    Sig: Take 1 capsule (25 mg total) by mouth at bedtime.    Dispense:  30 capsule    Refill:  5    Fill one day early if pharmacy is closed on scheduled refill date. May substitute for generic if available.  . traMADol (ULTRAM) 50 MG tablet    Sig: Take 1 tablet (50 mg total) by mouth 4 (four) times daily.    Dispense:  120 tablet    Refill:  5    Chronic Pain: STOP Act (Not applicable) Fill 1 day early if closed on refill date. Do not fill until: 04/04/2019. To last until: 10/01/2019. Avoid benzodiazepines within 8 hours of opioids   Orders:  Orders Placed This Encounter  Procedures  . Comp. Metabolic Panel (12)    With GFR. Indications: Chronic Pain Syndrome (G89.4) & Pharmacotherapy [id number])    Order Specific Question:   Has the patient fasted?    Answer:   No    Order Specific Question:   CC Results    Answer:   PCP-NURSE [id number]  . Magnesium    Indication: Pharmacologic therapy [id number])    Order Specific Question:   CC Results    Answer:   PCP-NURSE [id number]  . Vitamin B12    Indication: Pharmacologic therapy [id number]).    Order Specific Question:   CC Results    Answer:   PCP-NURSE [id number]  . Sedimentation rate    Indication: Disorder of skeletal system (M89.9)    Order Specific  Question:   CC Results    Answer:   PCP-NURSE [id number]  . 25-Hydroxyvitamin D Lcms D2+D3    Indication: Disorder of skeletal system (M89.9).    Order Specific Question:   CC Results    Answer:   PCP-NURSE [id number]  . C-reactive protein    Indication: Problems influencing health status (Z78.9)     Order Specific Question:   CC Results    Answer:   PCP-NURSE [id number]   Follow-up plan:   Return in about 6 months (around 10/01/2019) for (VV), (MM).      Considering: NOTE:Stop Elaquisfor 3 daysprior to aprocedure.    Palliative PRN treatment(s): Palliative/Therapeuticright-sided lumbar facet #2+right-sidedSIjoint RFA#1 Palliative right-sided lumbar facet block#4 Palliative right-sided sacroiliac joint block#4     Recent Visits Date Type Provider Dept  01/07/19 Office Visit Milinda Pointer, MD [facility]  Showing recent visits within past 90 days and meeting all other requirements   Today's Visits Date Type Provider Dept  03/23/19 Office Visit Milinda Pointer, MD [facility]  Showing today's visits and meeting all other requirements   Future Appointments No visits were found meeting these conditions.  Showing future appointments within next 90 days and meeting all other requirements   I discussed the assessment and treatment plan with the patient. The patient was provided an opportunity to ask questions and all were answered. The patient agreed with the plan and demonstrated an understanding of the instructions.  Patient advised to call back or seek an in-person evaluation if the symptoms or condition worsens.  Total duration of non-face-to-face encounter: 13 minutes.  Note by: Gaspar Cola, MD Date: 03/23/2019; Time: 1:20 PM  Note: This dictation was prepared with Dragon dictation. Any transcriptional errors that may result from this process are unintentional.  Disclaimer:  * Given the special circumstances of the COVID-19 pandemic, the federal government has announced that the Office for Civil Rights (OCR) will exercise its enforcement discretion and will not impose penalties on physicians using telehealth in the event of noncompliance with regulatory requirements under the [place] and [place] (HIPAA) in connection with the good faith provision of telehealth during the XX123456 national public health emergency. ([place])

## 2019-03-23 ENCOUNTER — Ambulatory Visit: Payer: Medicare Other | Attending: Nurse Practitioner | Admitting: Pain Medicine

## 2019-03-23 ENCOUNTER — Other Ambulatory Visit: Payer: Self-pay

## 2019-03-23 DIAGNOSIS — M533 Sacrococcygeal disorders, not elsewhere classified: Secondary | ICD-10-CM | POA: Insufficient documentation

## 2019-03-23 DIAGNOSIS — G894 Chronic pain syndrome: Secondary | ICD-10-CM

## 2019-03-23 DIAGNOSIS — M792 Neuralgia and neuritis, unspecified: Secondary | ICD-10-CM

## 2019-03-23 DIAGNOSIS — Z79899 Other long term (current) drug therapy: Secondary | ICD-10-CM

## 2019-03-23 DIAGNOSIS — M545 Low back pain: Secondary | ICD-10-CM | POA: Diagnosis not present

## 2019-03-23 DIAGNOSIS — M4310 Spondylolisthesis, site unspecified: Secondary | ICD-10-CM

## 2019-03-23 DIAGNOSIS — G8929 Other chronic pain: Secondary | ICD-10-CM

## 2019-03-23 DIAGNOSIS — Z789 Other specified health status: Secondary | ICD-10-CM

## 2019-03-23 DIAGNOSIS — M899 Disorder of bone, unspecified: Secondary | ICD-10-CM

## 2019-03-23 DIAGNOSIS — S322XXS Fracture of coccyx, sequela: Secondary | ICD-10-CM

## 2019-03-23 MED ORDER — TRAMADOL HCL 50 MG PO TABS: 50.0000 mg | ORAL_TABLET | Freq: Four times a day (QID) | ORAL | 5 refills | Status: DC

## 2019-03-23 MED ORDER — PREGABALIN 25 MG PO CAPS: 25.0000 mg | ORAL_CAPSULE | Freq: Every day | ORAL | 5 refills | Status: DC

## 2019-03-23 NOTE — Patient Instructions (Signed)
____________________________________________________________________________________________  Medication Rules  Purpose: To inform patients, and their family members, of our rules and regulations.  Applies to: All patients receiving prescriptions (written or electronic).  Pharmacy of record: Pharmacy where electronic prescriptions will be sent. If written prescriptions are taken to a different pharmacy, please inform the nursing staff. The pharmacy listed in the electronic medical record should be the one where you would like electronic prescriptions to be sent.  Electronic prescriptions: In compliance with the [place] Strengthen Opioid Misuse Prevention (STOP) Act of 2017 (Session Law 2017-74/H243), effective July 02, 2018, all controlled substances must be electronically prescribed. Calling prescriptions to the pharmacy will cease to exist.  Prescription refills: Only during scheduled appointments. Applies to all prescriptions.  NOTE: The following applies primarily to controlled substances (Opioid* Pain Medications).   Patient's responsibilities: 1. Pain Pills: Bring all pain pills to every appointment (except for procedure appointments). 2. Pill Bottles: Bring pills in original pharmacy bottle. Always bring the newest bottle. Bring bottle, even if empty. 3. Medication refills: You are responsible for knowing and keeping track of what medications you take and those you need refilled. The day before your appointment: write a list of all prescriptions that need to be refilled. The day of the appointment: give the list to the admitting nurse. Prescriptions will be written only during appointments. No prescriptions will be written on procedure days. If you forget a medication: it will not be "Called in", "Faxed", or "electronically sent". You will need to get another appointment to get these prescribed. No early refills. Do not call asking to have your prescription filled  early. 4. Prescription Accuracy: You are responsible for carefully inspecting your prescriptions before leaving our office. Have the discharge nurse carefully go over each prescription with you, before taking them home. Make sure that your name is accurately spelled, that your address is correct. Check the name and dose of your medication to make sure it is accurate. Check the number of pills, and the written instructions to make sure they are clear and accurate. Make sure that you are given enough medication to last until your next medication refill appointment. 5. Taking Medication: Take medication as prescribed. When it comes to controlled substances, taking less pills or less frequently than prescribed is permitted and encouraged. Never take more pills than instructed. Never take medication more frequently than prescribed.  6. Inform other Doctors: Always inform, all of your healthcare providers, of all the medications you take. 7. Pain Medication from other Providers: You are not allowed to accept any additional pain medication from any other Doctor or Healthcare provider. There are two exceptions to this rule. (see below) In the event that you require additional pain medication, you are responsible for notifying us, as stated below. 8. Medication Agreement: You are responsible for carefully reading and following our Medication Agreement. This must be signed before receiving any prescriptions from our practice. Safely store a copy of your signed Agreement. Violations to the Agreement will result in no further prescriptions. (Additional copies of our Medication Agreement are available upon request.) 9. Laws, Rules, & Regulations: All patients are expected to follow all Federal and State Laws, Statutes, Rules, & Regulations. Ignorance of the Laws does not constitute a valid excuse. The use of any illegal substances is prohibited. 10. Adopted CDC guidelines & recommendations: Target dosing levels will be  at or below 60 MME/day. Use of benzodiazepines** is not recommended.  Exceptions: There are only two exceptions to the rule of not   receiving pain medications from other Healthcare Providers. 1. Exception #1 (Emergencies): In the event of an emergency (i.e.: accident requiring emergency care), you are allowed to receive additional pain medication. However, you are responsible for: As soon as you are able, call our office [phone], at any time of the day or night, and leave a message stating your name, the date and nature of the emergency, and the name and dose of the medication prescribed. In the event that your call is answered by a member of our staff, make sure to document and save the date, time, and the name of the person that took your information.  2. Exception #2 (Planned Surgery): In the event that you are scheduled by another doctor or dentist to have any type of surgery or procedure, you are allowed (for a period no longer than 30 days), to receive additional pain medication, for the acute post-op pain. However, in this case, you are responsible for picking up a copy of our "Post-op Pain Management for Surgeons" handout, and giving it to your surgeon or dentist. This document is available at our office, and does not require an appointment to obtain it. Simply go to our office during business hours (Monday-Thursday from 8:00 AM to 4:00 PM) (Friday 8:00 AM to 12:00 Noon) or if you have a scheduled appointment with us, prior to your surgery, and ask for it by name. In addition, you will need to provide us with your name, name of your surgeon, type of surgery, and date of procedure or surgery.  *Opioid medications include: morphine, codeine, oxycodone, oxymorphone, hydrocodone, hydromorphone, meperidine, tramadol, tapentadol, buprenorphine, fentanyl, methadone. **Benzodiazepine medications include: diazepam (Valium), alprazolam (Xanax), clonazepam (Klonopine), lorazepam (Ativan), clorazepate  (Tranxene), chlordiazepoxide (Librium), estazolam (Prosom), oxazepam (Serax), temazepam (Restoril), triazolam (Halcion) (Last updated: 08/29/2017) ____________________________________________________________________________________________   ____________________________________________________________________________________________  Medication Recommendations and Reminders  Applies to: All patients receiving prescriptions (written and/or electronic).  Medication Rules & Regulations: These rules and regulations exist for your safety and that of others. They are not flexible and neither are we. Dismissing or ignoring them will be considered "non-compliance" with medication therapy, resulting in complete and irreversible termination of such therapy. (See document titled "Medication Rules" for more details.) In all conscience, because of safety reasons, we cannot continue providing a therapy where the patient does not follow instructions.  Pharmacy of record:   Definition: This is the pharmacy where your electronic prescriptions will be sent.   We do not endorse any particular pharmacy.  You are not restricted in your choice of pharmacy.  The pharmacy listed in the electronic medical record should be the one where you want electronic prescriptions to be sent.  If you choose to change pharmacy, simply notify our nursing staff of your choice of new pharmacy.  Recommendations:  Keep all of your pain medications in a safe place, under lock and key, even if you live alone.   After you fill your prescription, take 1 week's worth of pills and put them away in a safe place. You should keep a separate, properly labeled bottle for this purpose. The remainder should be kept in the original bottle. Use this as your primary supply, until it runs out. Once it's gone, then you know that you have 1 week's worth of medicine, and it is time to come in for a prescription refill. If you do this correctly, it  is unlikely that you will ever run out of medicine.  To make sure that the above recommendation works,   it is very important that you make sure your medication refill appointments are scheduled at least 1 week before you run out of medicine. To do this in an effective manner, make sure that you do not leave the office without scheduling your next medication management appointment. Always ask the nursing staff to show you in your prescription , when your medication will be running out. Then arrange for the receptionist to get you a return appointment, at least 7 days before you run out of medicine. Do not wait until you have 1 or 2 pills left, to come in. This is very poor planning and does not take into consideration that we may need to cancel appointments due to bad weather, sickness, or emergencies affecting our staff.  "Partial Fill": If for any reason your pharmacy does not have enough pills/tablets to completely fill or refill your prescription, do not allow for a "partial fill". You will need a separate prescription to fill the remaining amount, which we will not provide. If the reason for the partial fill is your insurance, you will need to talk to the pharmacist about payment alternatives for the remaining tablets, but again, do not accept a partial fill.  Prescription refills and/or changes in medication(s):   Prescription refills, and/or changes in dose or medication, will be conducted only during scheduled medication management appointments. (Applies to both, written and electronic prescriptions.)  No refills on procedure days. No medication will be changed or started on procedure days. No changes, adjustments, and/or refills will be conducted on a procedure day. Doing so will interfere with the diagnostic portion of the procedure.  No phone refills. No medications will be "called into the pharmacy".  No Fax refills.  No weekend refills.  No Holliday refills.  No after hours  refills.  Remember:  Business hours are:  Monday to Thursday 8:00 AM to 4:00 PM Provider's Schedule: Deveney Bayon, MD - Appointments are:  Medication management: Monday and Wednesday 8:00 AM to 4:00 PM Procedure day: Tuesday and Thursday 7:30 AM to 4:00 PM Bilal Lateef, MD - Appointments are:  Medication management: Tuesday and Thursday 8:00 AM to 4:00 PM Procedure day: Monday and Wednesday 7:30 AM to 4:00 PM (Last update: 08/29/2017) ____________________________________________________________________________________________    

## 2019-03-24 ENCOUNTER — Encounter: Payer: Medicare Other | Admitting: Nurse Practitioner

## 2019-03-30 DIAGNOSIS — M8088XA Other osteoporosis with current pathological fracture, vertebra(e), initial encounter for fracture: Secondary | ICD-10-CM | POA: Diagnosis not present

## 2019-03-30 DIAGNOSIS — G894 Chronic pain syndrome: Secondary | ICD-10-CM | POA: Diagnosis not present

## 2019-03-30 DIAGNOSIS — Z79899 Other long term (current) drug therapy: Secondary | ICD-10-CM | POA: Diagnosis not present

## 2019-03-30 DIAGNOSIS — Z789 Other specified health status: Secondary | ICD-10-CM | POA: Diagnosis not present

## 2019-03-30 DIAGNOSIS — M899 Disorder of bone, unspecified: Secondary | ICD-10-CM | POA: Diagnosis not present

## 2019-04-03 LAB — COMP. METABOLIC PANEL (12)
AST: 17 IU/L (ref 0–40)
Albumin/Globulin Ratio: 1.6 (ref 1.2–2.2)
Albumin: 4.1 g/dL (ref 3.5–4.6)
Alkaline Phosphatase: 172 IU/L — ABNORMAL HIGH (ref 39–117)
BUN/Creatinine Ratio: 28 (ref 12–28)
BUN: 13 mg/dL (ref 10–36)
Bilirubin Total: 0.6 mg/dL (ref 0.0–1.2)
Calcium: 9 mg/dL (ref 8.7–10.3)
Chloride: 100 mmol/L (ref 96–106)
Creatinine, Ser: 0.47 mg/dL — ABNORMAL LOW (ref 0.57–1.00)
GFR calc Af Amer: 99 mL/min/{1.73_m2} (ref >59)
GFR calc non Af Amer: 86 mL/min/{1.73_m2} (ref >59)
Globulin, Total: 2.6 g/dL (ref 1.5–4.5)
Glucose: 114 mg/dL — ABNORMAL HIGH (ref 65–99)
Potassium: 4 mmol/L (ref 3.5–5.2)
Sodium: 137 mmol/L (ref 134–144)
Total Protein: 6.7 g/dL (ref 6.0–8.5)

## 2019-04-03 LAB — C-REACTIVE PROTEIN: CRP: <1 mg/L (ref 0–10)

## 2019-04-03 LAB — SEDIMENTATION RATE: Sed Rate: 50 mm/hr — ABNORMAL HIGH (ref 0–40)

## 2019-04-03 LAB — 25-HYDROXY VITAMIN D LCMS D2+D3
25-Hydroxy, Vitamin D-2: <1 ng/mL
25-Hydroxy, Vitamin D-3: 14 ng/mL
25-Hydroxy, Vitamin D: 14 ng/mL — ABNORMAL LOW

## 2019-04-03 LAB — MAGNESIUM: Magnesium: 2 mg/dL (ref 1.6–2.3)

## 2019-04-03 LAB — VITAMIN B12: Vitamin B-12: 645 pg/mL (ref 232–1245)

## 2019-04-06 ENCOUNTER — Ambulatory Visit: Payer: Medicare Other | Admitting: Pain Medicine

## 2019-04-13 ENCOUNTER — Telehealth: Payer: Self-pay | Admitting: Pain Medicine

## 2019-04-13 NOTE — Telephone Encounter (Signed)
Attemtped to call, line busy.

## 2019-04-13 NOTE — Telephone Encounter (Signed)
Patient would like results of labs she had done.

## 2019-04-13 NOTE — Telephone Encounter (Signed)
Results read to patient. 

## 2019-04-29 NOTE — Progress Notes (Signed)

## 2019-04-29 NOTE — Progress Notes (Signed)
Test: Blood sugar (Glucose levels) Finding(s): High (hyperglycemia) Normal Level(s): Normal fasting (NPO x 8 hours) glucose levels are between 65-99 mg/dl, with 2 hour fasting, levels are usually less than 140 mg/dl. Clinical significance: Any random blood glucose level greater than 200 mg/dl is considered to be Diabetes. Signs and symptoms may include: (when persistently above 180 mg/dL) Increased thirst; headaches; trouble concentrating; blurred vision; frequent peeing (urination); fatigue (weakness and tired feeling); weight loss. The most common and classical symptoms of an undiagnosed diabetes with hyperglycemia are: Increased urinary frequency (polyuria), thirst (polydipsia), hunger (polyphagia), and unexplained weight loss; numbness in the extremities, pain in feet (dysesthesias), fatigue, and blurred vision; recurrent or severe infections; loss of consciousness or severe nausea/vomiting (ketoacidosis) or coma. Patient Recommendation(s): Fasting levels above 140 mg/dL or any levels above 200 mg/dL should follow-up with PCP (Primary Care Physician) for further evaluation. ___________________________________________________________________________________ - Normal Creatinine levels are between 0.5 and 0.9 mg/dl for our lab. Low blood levels of creatinine are not common, but they are also not usually a cause for concern. They can be seen with conditions that result in decreased muscle mass. - Possible causes: Low blood creatinine levels can mean lower muscle mass caused by a disease, such as muscular dystrophy, or by aging. Low levels can also mean some types of severe liver disease or a diet very low in protein. Pregnancy can also cause low blood creatinine levels. ___________________________________________________________________________________ - Normal ALP (Alkaline phosphatase) levels are between 35 -105 IU/L, for our Lab. High ALP could suggest liver damage or increased bone cell activity. If  other tests such as bilirubin, aspartate aminotransferase (AST), or alanine aminotransferase (ALT) are also high, usually the increased ALP is coming from the liver. Higher-than-normal ALP levels can be seen with: biliary obstruction; bone conditions; osteoblastic bone tumors; osteomalacia; a healing fracture; liver disease; hepatitis; eating a fatty meal if you have blood type O or B; hyperparathyroidism; leukemia; lymphoma; Paget disease; rickets; and/or sarcoidosis. ___________________________________________________________________________________

## 2019-04-29 NOTE — Progress Notes (Signed)
Test: ESR (Erythrocyte Sedimentation Rate) levels Finding(s): Elevated  Normal Level(s): below 30 mm/hr. Clinical significance: The sed rate is an acute phase reactant that indirectly measures the degree of inflammation present in the body. It can be acute, developing rapidly after trauma, injury or infection, for example, or can occur over an extended time (chronic) with conditions such as autoimmune diseases or cancer. The ESR is not diagnostic; it is a non-specific, screening test that may be elevated in a number of these different conditions. It provides general information about the presence or absence of an inflammatory condition. Signs and symptoms may include: None Possible causes:  - Most common: inflammation Patient Recommendation(s): Unless contraindicated, consider the use of anti-inflammatory diet and medications. (visit http://inflammationfactor.com/ ) ___________________________________________________________________________________ 

## 2019-05-04 ENCOUNTER — Ambulatory Visit: Payer: Medicare Other | Admitting: Podiatry

## 2019-05-04 ENCOUNTER — Other Ambulatory Visit: Payer: Self-pay

## 2019-05-04 ENCOUNTER — Encounter: Payer: Self-pay | Admitting: Podiatry

## 2019-05-04 DIAGNOSIS — M8088XA Other osteoporosis with current pathological fracture, vertebra(e), initial encounter for fracture: Secondary | ICD-10-CM | POA: Diagnosis not present

## 2019-05-04 DIAGNOSIS — L03032 Cellulitis of left toe: Secondary | ICD-10-CM | POA: Diagnosis not present

## 2019-05-04 DIAGNOSIS — D689 Coagulation defect, unspecified: Secondary | ICD-10-CM

## 2019-05-04 DIAGNOSIS — M79676 Pain in unspecified toe(s): Secondary | ICD-10-CM

## 2019-05-04 DIAGNOSIS — B351 Tinea unguium: Secondary | ICD-10-CM | POA: Diagnosis not present

## 2019-05-04 MED ORDER — CEPHALEXIN 500 MG PO CAPS: 500.0000 mg | ORAL_CAPSULE | Freq: Two times a day (BID) | ORAL | 0 refills | Status: DC

## 2019-05-04 NOTE — Progress Notes (Signed)
Complaint:  Visit Type: Patient returns to my office for continued preventative foot care services. Complaint: Patient states" my nails have grown long and thick and become painful to walk and wear shoes" . The patient presents for preventative foot care services. No changes to ROS.  Patient is taking eliquiss..  She says she is having severe pain in her second toe left foot for one week. .  She says there is pain from her blanket on the toe. She desires to have this second toe left foot examined.  Podiatric Exam: Vascular: dorsalis pedis and posterior tibial pulses are palpable bilateral. Capillary return is immediate. Temperature gradient is WNL. Skin turgor WNL  Sensorium: Normal Semmes Weinstein monofilament test. Normal tactile sensation bilaterally. Nail Exam: Pt has thick disfigured discolored nails with subungual debris noted bilateral entire nail hallux through fifth toenails.  There is incurvation of second toenail left foot Ulcer Exam: There is no evidence of ulcer or pre-ulcerative changes or infection. Orthopedic Exam: Muscle tone and strength are WNL. No limitations in general ROM. No crepitus or effusions noted. Contracture digits  B/L. Bony prominences are unremarkable. Skin: No Porokeratosis. No infection or ulcers  Diagnosis:  Onychomycosis, , Pain in right toe, pain in left toes  Paronychia second toe left hallux.  Treatment & Plan Procedures and Treatment: Consent by patient was obtained for treatment procedures.   Debridement of mycotic and hypertrophic toenails, 1 through 5 bilateral and clearing of subungual debris. No ulceration.  Pain and redness noted along the medial and lateral borders second toe left foot.  No pus or drainage noted.  Home soaking instructions dispensed.  Prescribe cephalexin for her.   Return Visit-Office Procedure: Patient instructed to return to the office for a follow up visit 9 weeks  for continued evaluation and treatment. If second toe worsens call  the office.  Patient says her toe is better leaving the office this morning.    Gardiner Barefoot DPM

## 2019-05-05 ENCOUNTER — Other Ambulatory Visit: Payer: Self-pay

## 2019-05-20 ENCOUNTER — Telehealth: Payer: Self-pay | Admitting: *Deleted

## 2019-05-20 NOTE — Telephone Encounter (Signed)
This is over the counter calcium carbonate 600 mg 1-2 pill daily with food  Vitamin D3 I would recommend she takes 4000 Iu daily total   She can get both in multivitamin I.e nature made or centrum silver for women  Would by additional vitamin D3 and take total 4000 IU daily so this would be additional 2000 IU to 3000 IU daily D3 depending on how much is in the otc multivitamin she buys  Inform pt   [place]

## 2019-05-20 NOTE — Telephone Encounter (Signed)
Copied from [place] [id number]. Topic: General - Other >> May 20, 2019  1:49 PM Celene Kras wrote: Reason for CRM: Pt called stating she has been told to start taking vitamin D with calcium. PT states she has not been able to find it. Please advise.

## 2019-05-20 NOTE — Telephone Encounter (Signed)
Pt called stating she has been told to start taking vitamin D with calcium. PT states she has not been able to find it. Please advise. This was to be a OVC medication  Patient stated she cannot find it can you write a Rx for this.  [name],cma

## 2019-05-21 NOTE — Telephone Encounter (Signed)
I called and spoke with the patient and gave her the names of the two multivitamins she needed to purchase OVC and pt understood and wrote them down.  [name],cma

## 2019-06-01 ENCOUNTER — Ambulatory Visit: Payer: Medicare Other | Admitting: Podiatry

## 2019-06-02 ENCOUNTER — Telehealth: Payer: Self-pay

## 2019-06-02 NOTE — Telephone Encounter (Signed)
She fell on 03/05/19 and had a MRI she fractured her tailbone. She is having trouble in that area now. It hurts her every so often and wants to speak someone about it.

## 2019-06-02 NOTE — Progress Notes (Signed)
Pain Management Virtual Encounter Note - Virtual Visit via Telephone Telehealth (real-time audio visits between healthcare provider and patient).   Patient's Phone No. & Preferred Pharmacy:  [phone] (home); [phone] (mobile); (Preferred) [phone] No e-mail address on record  [id number] [place], [place] - [address] [place] [address] Phone: [phone] Fax: [phone]    Pre-screening note:  Our staff contacted Amanda Soto and offered Amanda Soto an "in person", "face-to-face" appointment versus a telephone encounter. She indicated preferring the telephone encounter, at this time.   Reason for Virtual Visit: COVID-19*  Social distancing based on CDC and AMA recommendations.   I contacted Amanda Soto on 06/03/2019 via telephone.      I clearly identified myself as Gaspar Cola, MD. I verified that I was speaking with the correct person using two identifiers (Name: Amanda Soto, and date of birth: 03-23-1925).  Advanced Informed Consent I sought verbal advanced consent from Amanda Soto for virtual visit interactions. I informed Amanda Soto of possible security and privacy concerns, risks, and limitations associated with providing "not-in-person" medical evaluation and management services. I also informed Amanda Soto of the availability of "in-person" appointments. Finally, I informed Amanda Soto that there would be a charge for the virtual visit and that she could be  personally, fully or partially, financially responsible for it. Amanda Soto expressed understanding and agreed to proceed.   Historic Elements   Amanda Soto is a 85 y.o. year old, female patient evaluated today after Amanda Soto last encounter by our practice on 06/02/2019. Amanda Soto  has a past medical history of Acute postoperative pain (12/17/2016), Anemia (11/10/2015), Arthritis, Atrial fibrillation ([place]), CHF (congestive heart failure) ([place]), Chickenpox, Chronic abdominal pain (01/16/2014), Closed  Colles' fracture (12/17/2016), Degenerative arthritis of hip (08/19/2014), Degenerative arthritis of lumbar spine (11/18/2013), Depression, Diverticulitis, GERD (gastroesophageal reflux disease), Heart murmur, Heart rate slow, Hyperlipidemia, Hypertension, Hypothyroid, Neuritis or radiculitis due to rupture of lumbar intervertebral disc (11/18/2013), Skin cancer, Symptomatic anemia (11/09/2015), and Trochanteric bursitis of right hip (11/18/2013). She also  has a past surgical history that includes Cataract extraction; Partial hysterectomy; Appendectomy; and Tonsillectomy. Amanda Soto has a current medication list which includes the following prescription(s): acetaminophen, apixaban, vitamin d3, fluticasone, furosemide, fluzone high-dose, lidocaine, multivitamin, ocuvite eye health formula, omeprazole, sotalol, synthroid, tramadol, vitamin b-12, and methylprednisolone. She  reports that she has never smoked. She has never used smokeless tobacco. She reports current alcohol use of about 1.0 standard drinks of alcohol per week. She reports that she does not use drugs. Amanda Soto is allergic to hydrocodone; ciprofloxacin; and latex.   HPI  Today, she is being contacted for new problems.  According to the patient on September 2 she fell and fractured Amanda Soto tailbone.  There is an MRI that confirms then is 3 compression fracture.  Interestingly she says that since she fell and fractured Amanda Soto tailbone she has been experiencing pain in the area of the tailbone but she no longer has the pain in the lower back that we were treating before.  I told the patient to count Amanda Soto blessings and to cross Amanda Soto fingers and hope that it will come back.  I also offered the patient a caudal epidural steroid injection but like many other patients he is a little concerned about going out with the COVID-19 pandemic and therefore what I will be doing a sending a prescription to the pharmacy for a steroid taper for Amanda Soto to try.  If she  fails to get  improvement with this then we will go ahead and bring Amanda Soto in for a caudal epidural steroid injection.  In addition, the patient has stopped taking the Lyrica since Amanda Soto fall occurred on the first night that she took the Lyrica.  Apparently it made Amanda Soto very dizzy and she fell and broke Amanda Soto tailbone.  Clearly if she is the sensitive to the 25 mg of Lyrica, we can probably skip that.  The plan is to go ahead and send a prescription for a Medrol Dosepak and if the patient fails to get improvement with this, then we will consider a caudal epidural steroid injection under fluoroscopic guidance, no sedation.  Pharmacotherapy Assessment  Analgesic: Tramadol 50 mg, 1 tablet PO q 6 hrs (200 mg/day of tramadol) MME/day: 20 mg/day.   Monitoring: Pharmacotherapy: No side-effects or adverse reactions reported. [place] PMP: PDMP reviewed during this encounter.       Compliance: No problems identified. Effectiveness: Clinically acceptable. Plan: Refer to "POC".  UDS: No results found for: SUMMARY Laboratory Chemistry Profile (12 mo)  Renal: 03/30/2019: BUN 13; BUN/Creatinine Ratio 28; Creatinine, Ser 0.47  Lab Results  Component Value Date   GFR 74.62 07/03/2018   GFRAA 99 03/30/2019   GFRNONAA 86 03/30/2019   Hepatic: 03/30/2019: Albumin 4.1 Lab Results  Component Value Date   AST 17 03/30/2019   ALT 15 06/04/2017   Other: 03/30/2019: 25-Hydroxy, Vitamin D 14; 25-Hydroxy, Vitamin D-2 <1.0; 25-Hydroxy, Vitamin D-3 14; CRP <1; Sed Rate 50; Vitamin B-12 645 Note: Above Lab results reviewed.  Imaging  MR Lumbar Spine Wo Contrast CLINICAL DATA:  Chronic back pain.  EXAM: MRI LUMBAR SPINE WITHOUT CONTRAST  TECHNIQUE: Multiplanar, multisequence MR imaging of the lumbar spine was performed. No intravenous contrast was administered.  COMPARISON:  MR lumbar spine dated August 15, 2017.  FINDINGS: Segmentation:  Standard.  Alignment:  Unchanged trace anterolisthesis from L3-L4 to  L5-S1.  Vertebrae:  No fracture, evidence of discitis, or bone lesion.  Conus medullaris and cauda equina: Conus extends to the L1 level. Conus and cauda equina appear normal.  Paraspinal and other soft tissues: New marrow edema and deformity of the S3 vertebral body, suspicious for subacute nondisplaced fracture. Otherwise negative.  Disc levels:  T12-L1 to L2-L3: No significant disc bulge or herniation. No stenosis.  L3-L4: Unchanged minimal disc bulging and mild bilateral facet arthropathy. No stenosis.  L4-L5: Unchanged minimal disc bulging and moderate bilateral facet arthropathy. No stenosis.  L5-S1: Unchanged small shallow broad-based posterior disc protrusion and right subarticular annular fissure. Unchanged mild bilateral facet arthropathy. No stenosis.  IMPRESSION: 1. Unchanged facet predominant lower lumbar spondylosis. No stenosis or impingement. 2. New marrow edema and deformity of the S3 vertebral body, suspicious for subacute nondisplaced fracture.  Electronically Signed   By: Titus Dubin M.D.   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 Diagnoses of Traumatic closed fracture of sacrum (S3), sequela, Chronic low back pain (Primary Area of Pain) (Right), and Chronic pain syndrome were also pertinent to this visit.  Plan of Care  Problem-specific:  No problem-specific Assessment & Plan notes found for this encounter.  I have discontinued Amanda Soto's pregabalin, cephALEXin, predniSONE, cefdinir, and amoxicillin. I am also having Amanda Soto start on methylPREDNISolone. Additionally, I am having Amanda Soto maintain Amanda Soto acetaminophen, sotalol, multivitamin, Ocuvite Eye Health Formula, omeprazole, furosemide, vitamin B-12, fluticasone, apixaban, Synthroid, traMADol, Fluzone High-Dose, lidocaine, and Vitamin D3.  Pharmacotherapy (Medications Ordered): Meds ordered this  encounter  Medications  . methylPREDNISolone (MEDROL) 4  MG TBPK tablet    Sig: Follow package instructions.    Dispense:  21 tablet    Refill:  0    Do not add to the "Automatic Refill" notification system.   Orders:  No orders of the defined types were placed in this encounter.  Follow-up plan:   Return for Keep previously scheduled appointment.  Today we will go ahead and send the patient a prescription for a Medrol Dosepak to see if that can help with the tailbone pain from the S3 fracture.  If by any chance this does not help, then we will schedule Amanda Soto for a caudal epidural steroid injection under fluoroscopic guidance, no sedation.  Will need to stop Amanda Soto Eliquis for 3 days prior to the procedure.     Considering: NOTE:Stop Elaquisfor 3 daysprior to aprocedure.    Palliative PRN treatment(s): Palliative/Therapeuticright lumbar facet #2+right-sidedSIjoint RFA#1 Palliative right lumbar facet block#4 Palliative right sacroiliac joint block#4    Recent Visits Date Type Provider Dept  03/23/19 Office Visit Milinda Pointer, MD [facility]  Showing recent visits within past 90 days and meeting all other requirements   Today's Visits Date Type Provider Dept  06/03/19 Telemedicine Milinda Pointer, MD [facility]  Showing today's visits and meeting all other requirements   Future Appointments No visits were found meeting these conditions.  Showing future appointments within next 90 days and meeting all other requirements   I discussed the assessment and treatment plan with the patient. The patient was provided an opportunity to ask questions and all were answered. The patient agreed with the plan and demonstrated an understanding of the instructions.  Patient advised to call back or seek an in-person evaluation if the symptoms or condition worsens.  Total duration of non-face-to-face encounter: 13 minutes.  Note by: Gaspar Cola, MD Date: 06/03/2019; Time: 3:23 PM  Note: This  dictation was prepared with Dragon dictation. Any transcriptional errors that may result from this process are unintentional.  Disclaimer:  * Given the special circumstances of the COVID-19 pandemic, the federal government has announced that the Office for Civil Rights (OCR) will exercise its enforcement discretion and will not impose penalties on physicians using telehealth in the event of noncompliance with regulatory requirements under the [place] (HIPAA) in connection with the good faith provision of telehealth during the XX123456 national public health emergency. ([place])

## 2019-06-02 NOTE — Telephone Encounter (Signed)
Spoke with patient and she had the fall back in September and continues to have pain from that in her lower lumbar and sacral area.  Would like to talk with Dr Dossie Arbour to see if there is anything he may be able to do to help with the pain.  Transferred up to secretary to schedule VV.

## 2019-06-03 ENCOUNTER — Other Ambulatory Visit: Payer: Self-pay

## 2019-06-03 ENCOUNTER — Telehealth: Payer: Self-pay | Admitting: *Deleted

## 2019-06-03 ENCOUNTER — Ambulatory Visit: Payer: Medicare Other | Attending: Pain Medicine | Admitting: Pain Medicine

## 2019-06-03 DIAGNOSIS — M545 Low back pain: Secondary | ICD-10-CM | POA: Diagnosis not present

## 2019-06-03 DIAGNOSIS — S3210XS Unspecified fracture of sacrum, sequela: Secondary | ICD-10-CM

## 2019-06-03 DIAGNOSIS — M533 Sacrococcygeal disorders, not elsewhere classified: Secondary | ICD-10-CM | POA: Diagnosis not present

## 2019-06-03 DIAGNOSIS — G8929 Other chronic pain: Secondary | ICD-10-CM

## 2019-06-03 DIAGNOSIS — G894 Chronic pain syndrome: Secondary | ICD-10-CM

## 2019-06-03 MED ORDER — METHYLPREDNISOLONE 4 MG PO TBPK: ORAL_TABLET | ORAL | 0 refills | Status: AC

## 2019-06-08 ENCOUNTER — Encounter: Payer: Self-pay | Admitting: Family Medicine

## 2019-06-08 ENCOUNTER — Other Ambulatory Visit: Payer: Self-pay

## 2019-06-08 ENCOUNTER — Ambulatory Visit (INDEPENDENT_AMBULATORY_CARE_PROVIDER_SITE_OTHER): Payer: Medicare Other | Admitting: Family Medicine

## 2019-06-08 VITALS — Ht 59.5 in | Wt 115.0 lb

## 2019-06-08 DIAGNOSIS — F419 Anxiety disorder, unspecified: Secondary | ICD-10-CM

## 2019-06-08 DIAGNOSIS — E039 Hypothyroidism, unspecified: Secondary | ICD-10-CM | POA: Diagnosis not present

## 2019-06-08 DIAGNOSIS — E559 Vitamin D deficiency, unspecified: Secondary | ICD-10-CM | POA: Diagnosis not present

## 2019-06-08 DIAGNOSIS — M533 Sacrococcygeal disorders, not elsewhere classified: Secondary | ICD-10-CM | POA: Diagnosis not present

## 2019-06-08 DIAGNOSIS — I4891 Unspecified atrial fibrillation: Secondary | ICD-10-CM

## 2019-06-08 DIAGNOSIS — F329 Major depressive disorder, single episode, unspecified: Secondary | ICD-10-CM

## 2019-06-08 DIAGNOSIS — M8000XD Age-related osteoporosis with current pathological fracture, unspecified site, subsequent encounter for fracture with routine healing: Secondary | ICD-10-CM

## 2019-06-08 DIAGNOSIS — F32A Depression, unspecified: Secondary | ICD-10-CM

## 2019-06-08 DIAGNOSIS — R5383 Other fatigue: Secondary | ICD-10-CM

## 2019-06-08 MED ORDER — DULOXETINE HCL 30 MG PO CPEP: 30.0000 mg | ORAL_CAPSULE | Freq: Every day | ORAL | 3 refills | Status: DC

## 2019-06-08 NOTE — Assessment & Plan Note (Signed)
She will continue her current regimen through cardiology. 

## 2019-06-08 NOTE — Assessment & Plan Note (Signed)
Continues to have pain from her fracture.  She completed follow-up with orthopedics.  She will continue management of her pain through the pain clinic.  Discussed her osteoporosis diagnosis and the need for treatment.  She has been vitamin D deficient and we need to recheck that prior to starting on Fosamax.

## 2019-06-08 NOTE — Assessment & Plan Note (Signed)
Patient does report some depression.  We will start her on Cymbalta to see if that will help with her pain as well.  We will obtain some lab work as well to evaluate for other underlying causes of her lack of energy.

## 2019-06-08 NOTE — Assessment & Plan Note (Signed)
Seen on prior DEXA scan.  Not currently on treatment.  We need to recheck a vitamin D and if it is near the normal range we can start on Fosamax.  Plan for DEXA scan after starting on medication.

## 2019-06-08 NOTE — Progress Notes (Signed)
Virtual Visit via telephone Note  This visit type was conducted due to national recommendations for restrictions regarding the COVID-19 pandemic (e.g. social distancing).  This format is felt to be most appropriate for this patient at this time.  All issues noted in this document were discussed and addressed.  No physical exam was performed (except for noted visual exam findings with Video Visits).   I connected with Amanda Soto today at  1:15 PM EST by telephone and verified that I am speaking with the correct person using two identifiers. Location patient: home Location provider: home office Persons participating in the virtual visit: patient, provider  I discussed the limitations, risks, security and privacy concerns of performing an evaluation and management service by telephone and the availability of in person appointments. I also discussed with the patient that there may be a patient responsible charge related to this service. The patient expressed understanding and agreed to proceed.  Interactive audio and video telecommunications were attempted between this provider and patient, however failed, due to patient having technical difficulties OR patient did not have access to video capability.  We continued and completed visit with audio only.   Reason for visit: follow-up  HPI: Depression: Patient notes to a certain degree she does feel depressed and to a certain degree she does not.  She notes staying in all the time has her down.  She does see her family which is helpful.  She really just does not feel like doing much of anything recently.  No SI.  She describes some fatigue and lack of energy.  She wants to sleep all the time.  No snoring or apneic episodes.  No shortness of breath.  Sacral fracture: Patient had an MRI that revealed a sacral fracture previously.  She fell 2 days prior to that after starting on Lyrica and feeling dizzy.  She stopped the Lyrica after the fall.  She saw  orthopedics and had a follow-up x-ray per her report.  She notes she was released by them after 2 follow-up visits.  She does still have some pain.  Her chronic back pain is quite a bit better.  She is following with the pain clinic and they placed her on prednisone and note they will do a steroid injection if the prednisone does not work.  Prior DEXA scan with osteoporosis.  She is not currently on any treatment for osteoporosis.  No swallowing issues.  No esophageal or gastric ulcer history.  She does take vitamin D though no calcium.  A. fib: Taking Eliquis and sotalol.  Managed by cardiology.  No palpitations or bleeding issues.     ROS: See pertinent positives and negatives per HPI.  Past Medical History:  Diagnosis Date  . Acute postoperative pain 12/17/2016  . Anemia 11/10/2015  . Arthritis   . Atrial fibrillation ([place])   . CHF (congestive heart failure) ([place])   . Chickenpox   . Chronic abdominal pain 01/16/2014  . Closed Colles' fracture 12/17/2016  . Degenerative arthritis of hip 08/19/2014  . Degenerative arthritis of lumbar spine 11/18/2013  . Depression   . Diverticulitis   . GERD (gastroesophageal reflux disease)   . Heart murmur   . Heart rate slow   . Hyperlipidemia   . Hypertension   . Hypothyroid   . Neuritis or radiculitis due to rupture of lumbar intervertebral disc 11/18/2013  . Skin cancer   . Symptomatic anemia 11/09/2015  . Trochanteric bursitis of right hip 11/18/2013  Past Surgical History:  Procedure Laterality Date  . APPENDECTOMY    . CATARACT EXTRACTION    . PARTIAL HYSTERECTOMY    . TONSILLECTOMY      Family History  Problem Relation Age of Onset  . Stroke Maternal Grandmother   . Breast cancer Mother   . Breast cancer Sister   . Stroke Sister   . Multiple sclerosis Sister   . Heart disease Other        Parent, grandparent, sons  . Hypertension Other        Parent, grandparent    SOCIAL HX: Non-smoker.   Current Outpatient Medications:   .  acetaminophen (TYLENOL) 325 MG tablet, Take 500 mg by mouth every 6 (six) hours as needed for moderate pain, fever or headache. , Disp: , Rfl:  .  apixaban (ELIQUIS) 2.5 MG TABS tablet, Take 1 tablet (2.5 mg total) by mouth 2 (two) times daily., Disp: 60 tablet, Rfl: 11 .  Cholecalciferol (VITAMIN D3) 50 MCG (2000 UT) CHEW, Chew 1 tablet by mouth daily., Disp: , Rfl:  .  fluticasone (FLONASE) 50 MCG/ACT nasal spray, SPRAY 2 SPRAYS INTO EACH NOSTRIL EVERY DAY, Disp: , Rfl:  .  furosemide (LASIX) 20 MG tablet, Take 1 tablet (20 mg total) by mouth daily., Disp: 90 tablet, Rfl: 1 .  lidocaine (XYLOCAINE) 5 % ointment, lidocaine 5 % topical ointment  APPLY 1 APPLICATION TOPICALLY 4 (FOUR) TIMES DAILY AS NEEDED FOR MODERATE PAIN., Disp: , Rfl:  .  methylPREDNISolone (MEDROL) 4 MG TBPK tablet, Follow package instructions., Disp: 21 tablet, Rfl: 0 .  Multiple Vitamin (MULTIVITAMIN) capsule, Take 1 capsule by mouth daily. , Disp: , Rfl:  .  Multiple Vitamins-Minerals (OCUVITE EYE HEALTH FORMULA) CAPS, Take 1 capsule by mouth daily., Disp: , Rfl:  .  omeprazole (PRILOSEC) 40 MG capsule, Take by mouth., Disp: , Rfl:  .  sotalol (BETAPACE) 80 MG tablet, Take 40 mg by mouth 2 (two) times daily. , Disp: , Rfl:  .  SYNTHROID 50 MCG tablet, TAKE 1 TABLET BY MOUTH DAILY BEFORE BREAKFAST, Disp: 90 tablet, Rfl: 1 .  traMADol (ULTRAM) 50 MG tablet, Take 1 tablet (50 mg total) by mouth 4 (four) times daily., Disp: 120 tablet, Rfl: 5 .  vitamin B-12 (CYANOCOBALAMIN) 1000 MCG tablet, Take by mouth., Disp: , Rfl:  .  DULoxetine (CYMBALTA) 30 MG capsule, Take 1 capsule (30 mg total) by mouth daily., Disp: 30 capsule, Rfl: 3  EXAM: This is a telehealth telephone visit and thus no physical exam was completed.   ASSESSMENT AND PLAN:  Discussed the following assessment and plan:  Atrial fibrillation [facility]) She will continue her current regimen through cardiology.  Hypothyroidism Check TSH.  Anxiety and  depression Patient does report some depression.  We will start her on Cymbalta to see if that will help with her pain as well.  We will obtain some lab work as well to evaluate for other underlying causes of her lack of energy.  Coccygodynia Continues to have pain from her fracture.  She completed follow-up with orthopedics.  She will continue management of her pain through the pain clinic.  Discussed her osteoporosis diagnosis and the need for treatment.  She has been vitamin D deficient and we need to recheck that prior to starting on Fosamax.  OP (osteoporosis) Seen on prior DEXA scan.  Not currently on treatment.  We need to recheck a vitamin D and if it is near the normal range we can start on  Fosamax.  Plan for DEXA scan after starting on medication.    I discussed the assessment and treatment plan with the patient. The patient was provided an opportunity to ask questions and all were answered. The patient agreed with the plan and demonstrated an understanding of the instructions.   The patient was advised to call back or seek an in-person evaluation if the symptoms worsen or if the condition fails to improve as anticipated.  I provided 26 minutes of non-face-to-face time during this encounter.   [name], MD

## 2019-06-08 NOTE — Assessment & Plan Note (Signed)
Check TSH 

## 2019-06-16 ENCOUNTER — Other Ambulatory Visit: Payer: Self-pay

## 2019-06-16 ENCOUNTER — Other Ambulatory Visit: Payer: Self-pay | Admitting: Family Medicine

## 2019-06-16 ENCOUNTER — Other Ambulatory Visit (INDEPENDENT_AMBULATORY_CARE_PROVIDER_SITE_OTHER): Payer: Medicare Other

## 2019-06-16 DIAGNOSIS — E039 Hypothyroidism, unspecified: Secondary | ICD-10-CM | POA: Diagnosis not present

## 2019-06-16 DIAGNOSIS — E559 Vitamin D deficiency, unspecified: Secondary | ICD-10-CM

## 2019-06-16 DIAGNOSIS — I4891 Unspecified atrial fibrillation: Secondary | ICD-10-CM | POA: Diagnosis not present

## 2019-06-16 DIAGNOSIS — R5383 Other fatigue: Secondary | ICD-10-CM | POA: Diagnosis not present

## 2019-06-16 DIAGNOSIS — E871 Hypo-osmolality and hyponatremia: Secondary | ICD-10-CM

## 2019-06-16 DIAGNOSIS — D582 Other hemoglobinopathies: Secondary | ICD-10-CM

## 2019-06-16 LAB — COMPREHENSIVE METABOLIC PANEL
ALT: 15 U/L (ref 0–35)
AST: 20 U/L (ref 0–37)
Albumin: 4.1 g/dL (ref 3.5–5.2)
Alkaline Phosphatase: 72 U/L (ref 39–117)
BUN: 15 mg/dL (ref 6–23)
CO2: 30 mEq/L (ref 19–32)
Calcium: 9.2 mg/dL (ref 8.4–10.5)
Chloride: 96 mEq/L (ref 96–112)
Creatinine, Ser: 0.53 mg/dL (ref 0.40–1.20)
GFR: 107.81 mL/min (ref >60.00)
Glucose, Bld: 81 mg/dL (ref 70–99)
Potassium: 3.8 mEq/L (ref 3.5–5.1)
Sodium: 133 mEq/L — ABNORMAL LOW (ref 135–145)
Total Bilirubin: 0.9 mg/dL (ref 0.2–1.2)
Total Protein: 7.4 g/dL (ref 6.0–8.3)

## 2019-06-16 LAB — CBC
HCT: 47.9 % — ABNORMAL HIGH (ref 36.0–46.0)
Hemoglobin: 15.8 g/dL — ABNORMAL HIGH (ref 12.0–15.0)
MCHC: 33.1 g/dL (ref 30.0–36.0)
MCV: 96.3 fl (ref 78.0–100.0)
Platelets: 256 10*3/uL (ref 150.0–400.0)
RBC: 4.97 Mil/uL (ref 3.87–5.11)
RDW: 14.5 % (ref 11.5–15.5)
WBC: 9.6 10*3/uL (ref 4.0–10.5)

## 2019-06-16 LAB — VITAMIN B12: Vitamin B-12: >1500 pg/mL — ABNORMAL HIGH (ref 211–911)

## 2019-06-16 LAB — TSH: TSH: 2.49 u[IU]/mL (ref 0.35–4.50)

## 2019-06-16 LAB — VITAMIN D 25 HYDROXY (VIT D DEFICIENCY, FRACTURES): VITD: 25.8 ng/mL — ABNORMAL LOW (ref 30.00–100.00)

## 2019-06-22 ENCOUNTER — Telehealth: Payer: Self-pay | Admitting: Pain Medicine

## 2019-06-22 NOTE — Telephone Encounter (Signed)
Patient states prednisone did not help at all. She is still in pain

## 2019-06-24 NOTE — Telephone Encounter (Signed)
Tried to call. Line is busy

## 2019-07-06 ENCOUNTER — Other Ambulatory Visit: Payer: Self-pay

## 2019-07-06 ENCOUNTER — Encounter: Payer: Self-pay | Admitting: Podiatry

## 2019-07-06 ENCOUNTER — Ambulatory Visit: Payer: Medicare Other | Admitting: Podiatry

## 2019-07-06 DIAGNOSIS — L608 Other nail disorders: Secondary | ICD-10-CM

## 2019-07-06 DIAGNOSIS — M79609 Pain in unspecified limb: Secondary | ICD-10-CM | POA: Diagnosis not present

## 2019-07-06 DIAGNOSIS — B351 Tinea unguium: Secondary | ICD-10-CM

## 2019-07-06 DIAGNOSIS — D689 Coagulation defect, unspecified: Secondary | ICD-10-CM | POA: Diagnosis not present

## 2019-07-06 NOTE — Progress Notes (Signed)
Complaint:  Visit Type: Patient returns to my office for continued preventative foot care services. Complaint: Patient states" my nails have grown long and thick and become painful to walk and wear shoes" . The patient presents for preventative foot care services. No changes to ROS.  Patient is taking eliquiss. Patient says her second toe is no longer hurting. Podiatric Exam: Vascular: dorsalis pedis and posterior tibial pulses are palpable bilateral. Capillary return is immediate. Temperature gradient is WNL. Skin turgor WNL  Sensorium: Normal Semmes Weinstein monofilament test. Normal tactile sensation bilaterally. Nail Exam: Pt has thick disfigured discolored nails with subungual debris noted bilateral entire nail hallux through fifth toenails.  Pincer nails  B/L. Ulcer Exam: There is no evidence of ulcer or pre-ulcerative changes or infection. Orthopedic Exam: Muscle tone and strength are WNL. No limitations in general ROM. No crepitus or effusions noted. Foot type and digits show no abnormalities. Bony prominences are unremarkable. Skin: No Porokeratosis. No infection or ulcers  Diagnosis:  Onychomycosis, , Pain in right toe, pain in left toes  Treatment & Plan Procedures and Treatment: Consent by patient was obtained for treatment procedures.   Debridement of mycotic and hypertrophic toenails, 1 through 5 bilateral and clearing of subungual debris. No ulceration, no infection noted.  Return Visit-Office Procedure: Patient instructed to return to the office for a follow up visit 3 months for continued evaluation and treatment.    Gardiner Barefoot DPM

## 2019-07-07 ENCOUNTER — Encounter: Payer: Self-pay | Admitting: Pain Medicine

## 2019-07-07 ENCOUNTER — Other Ambulatory Visit: Payer: Self-pay | Admitting: Family Medicine

## 2019-07-07 NOTE — Progress Notes (Signed)
Virtual Encounter - Pain Management PROVIDER NOTE: Information contained herein reflects review and annotations entered in association with encounter. Interpretation of such information and data should be left to medically-trained personnel. Information provided to patient can be located elsewhere in the medical record under "Patient Instructions". Document created using STT-dictation technology, any transcriptional errors that may result from process are unintentional.    Contact & Pharmacy Preferred: [place]: [phone] (home) Mobile: [phone] (mobile) E-mail: No e-mail address on record  [id number] [place], [place] - [address] [place] [address] Phone: [phone] Fax: [phone]   Pre-screening  Amanda Soto offered "in-person" vs "virtual" encounter. She indicated preferring virtual for this encounter.   Reason COVID-19*  Social distancing based on CDC and AMA recommendations.   I contacted Amanda Soto on 07/08/2019 via telephone.      I clearly identified myself as Amanda Cola, MD. I verified that I was speaking with the correct person using two identifiers (Name: Amanda Soto, and date of birth: Feb 12, 1925).  Consent I sought verbal advanced consent from Amanda Soto for virtual visit interactions. I informed Amanda Soto of possible security and privacy concerns, risks, and limitations associated with providing "not-in-person" medical evaluation and management services. I also informed Amanda Soto of the availability of "in-person" appointments. Finally, I informed her that there would be a charge for the virtual visit and that she could be  personally, fully or partially, financially responsible for it. Amanda Soto expressed understanding and agreed to proceed.   Historic Elements   Amanda Soto is a 86 y.o. year old, female patient evaluated today after her last encounter by our practice on 06/22/2019. Amanda Soto  has a past  medical history of Acute postoperative pain (12/17/2016), Anemia (11/10/2015), Arthritis, Atrial fibrillation ([place]), CHF (congestive heart failure) ([place]), Chickenpox, Chronic abdominal pain (01/16/2014), Closed Colles' fracture (12/17/2016), Degenerative arthritis of hip (08/19/2014), Degenerative arthritis of lumbar spine (11/18/2013), Depression, Diverticulitis, GERD (gastroesophageal reflux disease), Heart murmur, Heart rate slow, Hyperlipidemia, Hypertension, Hypothyroid, Neuritis or radiculitis due to rupture of lumbar intervertebral disc (11/18/2013), Skin cancer, Symptomatic anemia (11/09/2015), and Trochanteric bursitis of right hip (11/18/2013). She also  has a past surgical history that includes Cataract extraction; Partial hysterectomy; Appendectomy; and Tonsillectomy. Amanda Soto has a current medication list which includes the following prescription(s): acetaminophen, apixaban, aspirin, vitamin d3, duloxetine, fluticasone, furosemide, multivitamin, ocuvite eye health formula, omeprazole, sotalol, synthroid, tramadol, and vitamin b-12. She  reports that she has never smoked. She has never used smokeless tobacco. She reports current alcohol use of about 1.0 standard drinks of alcohol per week. She reports that she does not use drugs. Amanda Soto is allergic to hydrocodone; ciprofloxacin; and latex.   HPI  Today, she is being contacted for worsening of previously known (established) problem.  The patient is still experiencing some pain in the tailbone area as a consequence of the tailbone fracture that she saw her around March 04, 2019.  She did go back to the orthopedic surgeons and they have taken some follow-up x-rays and she was told that everything looked good and that it was healing.  However, she still experiencing quite a bit of pain and she will like to know if there is something that we can offer her.  I have informed her that we could do a caudal epidural steroid injection to see if we could help some  of that pain by putting some anti-inflammatory medicine in the caudal  canal to help with the pain from the fracture.  She indicated that she was very interested in having that done as soon as possible and I reminded her that in order to do this in a safe manner she would need to stop her Eliquis for 3 days prior to the procedure.  She has done this before without any problems.  She was a little concerned about the intake of steroids in her upcoming COVID-19 vaccine.  I told her that in an ideal world it would be better if she did not have any steroids either injectable, oral, or even inhaled for 2 weeks before or after the Covid injection, but I also explained to her that this was more about the area rather than a fact.  I provided all the information that she needed to make a decision on addend she still wants to proceed with the injection.  I also asked her if she was taking any pain medication at home for this discomfort and she indicated that she had the tramadol which she was taking up to 1 tablet p.o. twice daily for the pain.  I asked her if she was having any type of side effects of the medication and she indicated that she was not.  I then asked her why she was not taking it up to 4 times a day as she is allowed and she indicated that she just does not want to be reliant them much on pain medication.  I told her that I understood and I respected that, but there was no reason why she should suffer when she has the means to control some of this pain.  I think she understood and I have encouraged her to use the medication up to 4 times a day, if she really needs it.  In addition to that, I will be in an order in for her to have the caudal epidural steroid injection as soon as possible.  Pharmacotherapy Assessment  Analgesic: Tramadol 50 mg, 1 tablet PO q 6 hrs (200 mg/day of tramadol) MME/day: 20 mg/day.   Monitoring: Pharmacotherapy: No side-effects or adverse reactions reported. [place] PMP: PDMP reviewed  during this encounter.       Compliance: No problems identified. Effectiveness: Clinically acceptable. Plan: Refer to "POC".  UDS: No results found for: SUMMARY Laboratory Chemistry Profile (12 mo)  Renal: 03/30/2019: BUN/Creatinine Ratio 28 06/16/2019: BUN 15; Creatinine, Ser 0.53  Lab Results  Component Value Date   GFR 107.81 06/16/2019   GFRAA 99 03/30/2019   GFRNONAA 86 03/30/2019   Hepatic: 06/16/2019: Albumin 4.1 Lab Results  Component Value Date   AST 20 06/16/2019   ALT 15 06/16/2019   Other: 03/30/2019: 25-Hydroxy, Vitamin D 14; 25-Hydroxy, Vitamin D-2 <1.0; 25-Hydroxy, Vitamin D-3 14; CRP <1; Sed Rate 50 06/16/2019: Vitamin B-12 >1500; VITD 25.80 Note: Above Lab results reviewed.  Imaging  MR Lumbar Spine Wo Contrast CLINICAL DATA:  Chronic back pain.  EXAM: MRI LUMBAR SPINE WITHOUT CONTRAST  TECHNIQUE: Multiplanar, multisequence MR imaging of the lumbar spine was performed. No intravenous contrast was administered.  COMPARISON:  MR lumbar spine dated August 15, 2017.  FINDINGS: Segmentation:  Standard.  Alignment:  Unchanged trace anterolisthesis from L3-L4 to L5-S1.  Vertebrae:  No fracture, evidence of discitis, or bone lesion.  Conus medullaris and cauda equina: Conus extends to the L1 level. Conus and cauda equina appear normal.  Paraspinal and other soft tissues: New marrow edema and deformity of the S3 vertebral body, suspicious for  subacute nondisplaced fracture. Otherwise negative.  Disc levels:  T12-L1 to L2-L3: No significant disc bulge or herniation. No stenosis.  L3-L4: Unchanged minimal disc bulging and mild bilateral facet arthropathy. No stenosis.  L4-L5: Unchanged minimal disc bulging and moderate bilateral facet arthropathy. No stenosis.  L5-S1: Unchanged small shallow broad-based posterior disc protrusion and right subarticular annular fissure. Unchanged mild bilateral facet arthropathy. No stenosis.  IMPRESSION: 1.  Unchanged facet predominant lower lumbar spondylosis. No stenosis or impingement. 2. New marrow edema and deformity of the S3 vertebral body, suspicious for subacute nondisplaced fracture.  Electronically Signed   By: Titus Dubin M.D.   On: 03/06/2019 11:14   Assessment  The primary encounter diagnosis was Chronic pain syndrome. Diagnoses of Coccygodynia, Chronic low back pain (Primary Area of Pain) (Right), Grade 1 Anterolisthesis of L4 over L5 (5 mm), Traumatic closed fracture of sacrum (S3), sequela, and Chronic anticoagulation (Eliquis) were also pertinent to this visit.  Plan of Care  Problem-specific:  No problem-specific Assessment & Plan notes found for this encounter.  I have discontinued Lachelle J. Nevels's lidocaine. I am also having her maintain her acetaminophen, sotalol, multivitamin, Ocuvite Eye Health Formula, furosemide, vitamin B-12, fluticasone, apixaban, Synthroid, traMADol, Vitamin D3, aspirin, omeprazole, and DULoxetine.  Pharmacotherapy (Medications Ordered): No orders of the defined types were placed in this encounter.  Orders:  Orders Placed This Encounter  Procedures  . Caudal ESI (Schedule)    Standing Status:   Future    Standing Expiration Date:   08/08/2019    Scheduling Instructions:     Laterality: Midline     Level(s): Sacrococcygeal canal (Tailbone area)     Sedation: Patient's choice     Scheduling Timeframe: As soon as pre-approved    Order Specific Question:   Where will this procedure be performed?    Answer:   [facility]  . Blood Thinner (Standing Order)    If the patient requires a Lovenox-bridge therapy, make sure arrangements are made to institute it with the assistance of the PCP.    Standing Status:   Standing    Number of Occurrences:   36    Standing Expiration Date:   01/04/2021    Scheduling Instructions:     Always stop the Eliquis (Apixaban) x 3 days prior to procedure or surgery.   Follow-up plan:   Return for  Procedure (no sedation):Marland Kitchen      Considering: NOTE:Stop Eliquisfor 3 daysprior to aprocedure. Diagnostic/therapeutic caudal epidural steroid injection #1 under fluoroscopic guidance.   Palliative PRN treatment(s): Palliative/Therapeuticright lumbar facet #2+right-sidedSIjoint RFA#1 Palliative right lumbar facet block#4 Palliative right sacroiliac joint block#4    Recent Visits Date Type Provider Dept  06/03/19 Telemedicine Milinda Pointer, MD [facility]  Showing recent visits within past 90 days and meeting all other requirements   Today's Visits Date Type Provider Dept  07/08/19 Telemedicine Milinda Pointer, MD [facility]  Showing today's visits and meeting all other requirements   Future Appointments Date Type Provider Dept  09/21/19 Appointment Milinda Pointer, MD [facility]  Showing future appointments within next 90 days and meeting all other requirements   I discussed the assessment and treatment plan with the patient. The patient was provided an opportunity to ask questions and all were answered. The patient agreed with the plan and demonstrated an understanding of the instructions.  Patient advised to call back or seek an in-person evaluation if the symptoms or condition worsens.  Total duration of non-face-to-face encounter: 15  minutes.  Note by: Amanda Cola, MD Date: 07/08/2019; Time: 4:03 PM

## 2019-07-08 ENCOUNTER — Other Ambulatory Visit: Payer: Self-pay

## 2019-07-08 ENCOUNTER — Telehealth: Payer: Self-pay | Admitting: *Deleted

## 2019-07-08 ENCOUNTER — Ambulatory Visit: Payer: Medicare Other | Attending: Pain Medicine | Admitting: Pain Medicine

## 2019-07-08 DIAGNOSIS — G894 Chronic pain syndrome: Secondary | ICD-10-CM

## 2019-07-08 DIAGNOSIS — M533 Sacrococcygeal disorders, not elsewhere classified: Secondary | ICD-10-CM | POA: Diagnosis not present

## 2019-07-08 DIAGNOSIS — X58XXXS Exposure to other specified factors, sequela: Secondary | ICD-10-CM

## 2019-07-08 DIAGNOSIS — M4310 Spondylolisthesis, site unspecified: Secondary | ICD-10-CM

## 2019-07-08 DIAGNOSIS — S3210XS Unspecified fracture of sacrum, sequela: Secondary | ICD-10-CM | POA: Diagnosis not present

## 2019-07-08 DIAGNOSIS — Z7901 Long term (current) use of anticoagulants: Secondary | ICD-10-CM

## 2019-07-08 DIAGNOSIS — M545 Low back pain: Secondary | ICD-10-CM | POA: Diagnosis not present

## 2019-07-08 DIAGNOSIS — G8929 Other chronic pain: Secondary | ICD-10-CM

## 2019-07-08 NOTE — Patient Instructions (Signed)
____________________________________________________________________________________________  Preparing for your procedure (without sedation)  Procedure appointments are limited to planned procedures: . No Prescription Refills. . No disability issues will be discussed. . No medication changes will be discussed.  Instructions: . Oral Intake: Do not eat or drink anything for at least 3 hours prior to your procedure. . Transportation: Unless otherwise stated by your physician, you may drive yourself after the procedure. . Blood Pressure Medicine: Take your blood pressure medicine with a sip of water the morning of the procedure. . Blood thinners: Notify our staff if you are taking any blood thinners. Depending on which one you take, there will be specific instructions on how and when to stop it. . Diabetics on insulin: Notify the staff so that you can be scheduled 1st case in the morning. If your diabetes requires high dose insulin, take only  of your normal insulin dose the morning of the procedure and notify the staff that you have done so. . Preventing infections: Shower with an antibacterial soap the morning of your procedure.  . Build-up your immune system: Take 1000 mg of Vitamin C with every meal (3 times a day) the day prior to your procedure. . Antibiotics: Inform the staff if you have a condition or reason that requires you to take antibiotics before dental procedures. . Pregnancy: If you are pregnant, call and cancel the procedure. . Sickness: If you have a cold, fever, or any active infections, call and cancel the procedure. . Arrival: You must be in the facility at least 30 minutes prior to your scheduled procedure. . Children: Do not bring any children with you. . Dress appropriately: Bring dark clothing that you would not mind if they get stained. . Valuables: Do not bring any jewelry or valuables.  Reasons to call and reschedule or cancel your procedure: (Following these  recommendations will minimize the risk of a serious complication.) . Surgeries: Avoid having procedures within 2 weeks of any surgery. (Avoid for 2 weeks before or after any surgery). . Flu Shots: Avoid having procedures within 2 weeks of a flu shots or . (Avoid for 2 weeks before or after immunizations). . Barium: Avoid having a procedure within 7-10 days after having had a radiological study involving the use of radiological contrast. (Myelograms, Barium swallow or enema study). . Heart attacks: Avoid any elective procedures or surgeries for the initial 6 months after a "Myocardial Infarction" (Heart Attack). . Blood thinners: It is imperative that you stop these medications before procedures. Let us know if you if you take any blood thinner.  . Infection: Avoid procedures during or within two weeks of an infection (including chest colds or gastrointestinal problems). Symptoms associated with infections include: Localized redness, fever, chills, night sweats or profuse sweating, burning sensation when voiding, cough, congestion, stuffiness, runny nose, sore throat, diarrhea, nausea, vomiting, cold or Flu symptoms, recent or current infections. It is specially important if the infection is over the area that we intend to treat. . Heart and lung problems: Symptoms that may suggest an active cardiopulmonary problem include: cough, chest pain, breathing difficulties or shortness of breath, dizziness, ankle swelling, uncontrolled high or unusually low blood pressure, and/or palpitations. If you are experiencing any of these symptoms, cancel your procedure and contact your primary care physician for an evaluation.  Remember:  Regular Business hours are:  Monday to Thursday 8:00 AM to 4:00 PM  Provider's Schedule: Freeda Spivey, MD:  Procedure days: Tuesday and Thursday 7:30 AM to 4:00 PM  [place]   Holley Raring, MD:  Procedure days: Monday and Wednesday 7:30 AM to 4:00  PM ____________________________________________________________________________________________   ____________________________________________________________________________________________  Blood Thinners  IMPORTANT NOTICE:  If you take any of these, make sure to notify the nursing staff.  Failure to do so may result in injury.  Recommended time intervals to stop and restart blood-thinners, before & after invasive procedures  Generic Name Brand Name Stop Time. Must be stopped at least this long before procedures. After procedures, wait at least this long before re-starting.  Abciximab Reopro 15 days 2 hrs  Alteplase Activase 10 days 10 days  Anagrelide Agrylin    Apixaban Eliquis 3 days 6 hrs  Cilostazol Pletal 3 days 5 hrs  Clopidogrel Plavix 7-10 days 2 hrs  Dabigatran Pradaxa 5 days 6 hrs  Dalteparin Fragmin 24 hours 4 hrs  Dipyridamole Aggrenox 11days 2 hrs  Edoxaban Lixiana; Savaysa 3 days 2 hrs  Enoxaparin  Lovenox 24 hours 4 hrs  Eptifibatide Integrillin 8 hours 2 hrs  Fondaparinux  Arixtra 72 hours 12 hrs  Prasugrel Effient 7-10 days 6 hrs  Reteplase Retavase 10 days 10 days  Rivaroxaban Xarelto 3 days 6 hrs  Ticagrelor Brilinta 5-7 days 6 hrs  Ticlopidine Ticlid 10-14 days 2 hrs  Tinzaparin Innohep 24 hours 4 hrs  Tirofiban Aggrastat 8 hours 2 hrs  Warfarin Coumadin 5 days 2 hrs   Other medications with blood-thinning effects  Product indications Generic (Brand) names Note  Cholesterol Lipitor Stop 4 days before procedure  Blood thinner (injectable) Heparin (LMW or LMWH Heparin) Stop 24 hours before procedure  Cancer Ibrutinib (Imbruvica) Stop 7 days before procedure  Malaria/Rheumatoid Hydroxychloroquine (Plaquenil) Stop 11 days before procedure  Thrombolytics  10 days before or after procedures   Over-the-counter (OTC) Products with blood-thinning effects  Product Common names Stop Time  Aspirin > 325 mg Goody Powders, Excedrin, etc. 11 days  Aspirin ? 81  mg  7 days  Fish oil  4 days  Garlic supplements  7 days  Ginkgo biloba  36 hours  Ginseng  24 hours  NSAIDs Ibuprofen, Naprosyn, etc. 3 days  Vitamin E  4 days   ____________________________________________________________________________________________

## 2019-07-21 ENCOUNTER — Encounter: Payer: Self-pay | Admitting: Pain Medicine

## 2019-07-21 ENCOUNTER — Ambulatory Visit (HOSPITAL_BASED_OUTPATIENT_CLINIC_OR_DEPARTMENT_OTHER): Payer: Medicare Other | Admitting: Pain Medicine

## 2019-07-21 ENCOUNTER — Ambulatory Visit
Admission: RE | Admit: 2019-07-21 | Discharge: 2019-07-21 | Disposition: A | Discharge status: Home / Self Care | Payer: Medicare Other | Source: Ambulatory Visit | Attending: Pain Medicine | Admitting: Pain Medicine

## 2019-07-21 ENCOUNTER — Other Ambulatory Visit: Payer: Self-pay

## 2019-07-21 VITALS — BP 143/49 | HR 50 | Temp 97.4°F | Resp 21 | Ht 59.0 in | Wt 115.0 lb

## 2019-07-21 DIAGNOSIS — Z91040 Latex allergy status: Secondary | ICD-10-CM | POA: Insufficient documentation

## 2019-07-21 DIAGNOSIS — M4310 Spondylolisthesis, site unspecified: Secondary | ICD-10-CM

## 2019-07-21 DIAGNOSIS — M5136 Other intervertebral disc degeneration, lumbar region: Secondary | ICD-10-CM

## 2019-07-21 DIAGNOSIS — M545 Low back pain, unspecified: Secondary | ICD-10-CM

## 2019-07-21 DIAGNOSIS — Z7901 Long term (current) use of anticoagulants: Secondary | ICD-10-CM | POA: Diagnosis not present

## 2019-07-21 DIAGNOSIS — M533 Sacrococcygeal disorders, not elsewhere classified: Secondary | ICD-10-CM | POA: Insufficient documentation

## 2019-07-21 DIAGNOSIS — G8929 Other chronic pain: Secondary | ICD-10-CM | POA: Diagnosis not present

## 2019-07-21 MED ORDER — SODIUM CHLORIDE 0.9% FLUSH
2.0000 mL | Freq: Once | INTRAVENOUS | Status: AC
  Administered 2019-07-21: 2 mL

## 2019-07-21 MED ORDER — IOHEXOL 180 MG/ML  SOLN
10.0000 mL | Freq: Once | INTRAMUSCULAR | Status: AC
  Administered 2019-07-21: 10 mL via EPIDURAL
  Filled 2019-07-21: qty 20

## 2019-07-21 MED ORDER — SODIUM CHLORIDE (PF) 0.9 % IJ SOLN
INTRAMUSCULAR | Status: AC
  Filled 2019-07-21: qty 10

## 2019-07-21 MED ORDER — TRIAMCINOLONE ACETONIDE 40 MG/ML IJ SUSP
40.0000 mg | Freq: Once | INTRAMUSCULAR | Status: AC
  Administered 2019-07-21: 40 mg
  Filled 2019-07-21: qty 1

## 2019-07-21 MED ORDER — ROPIVACAINE HCL 2 MG/ML IJ SOLN
2.0000 mL | Freq: Once | INTRAMUSCULAR | Status: AC
  Administered 2019-07-21: 13:00:00 2 mL via EPIDURAL
  Filled 2019-07-21: qty 10

## 2019-07-21 MED ORDER — LIDOCAINE HCL 2 % IJ SOLN
20.0000 mL | Freq: Once | INTRAMUSCULAR | Status: AC
  Administered 2019-07-21: 400 mg
  Filled 2019-07-21: qty 40

## 2019-07-21 NOTE — Progress Notes (Signed)
Safety precautions to be maintained throughout the outpatient stay will include: orient to surroundings, keep bed in low position, maintain call bell within reach at all times, provide assistance with transfer out of bed and ambulation.  

## 2019-07-21 NOTE — Progress Notes (Signed)
PROVIDER NOTE: Information contained herein reflects review and annotations entered in association with encounter. Interpretation of such information and data should be left to medically-trained personnel. Information provided to patient can be located elsewhere in the medical record under "Patient Instructions". Document created using STT-dictation technology, any transcriptional errors that may result from process are unintentional.    Patient: Amanda Soto  Service Category: Procedure  Provider: Gaspar Cola, MD  DOB: Nov 28, 1924  DOS: 07/21/2019  Location: [facility]  MRN: [id number]  Setting: Ambulatory - outpatient  Referring Provider: [name], MD  Type: Established Patient  Specialty: Interventional Pain Management  PCP: [name], MD   Primary Reason for Visit: Interventional Pain Management Treatment. CC: Back Pain (low, coccyx)  Procedure:          Anesthesia, Analgesia, Anxiolysis:  Type: Diagnostic Epidural Steroid Injection #1  Region: Caudal Level: Sacrococcygeal   Laterality: Midline       Type: Local Anesthesia Indication(s): Analgesia         Route: Infiltration ([place]/IM) IV Access: Declined Sedation: Declined  Local Anesthetic: Lidocaine 1-2%  Position: Prone   Indications: 1. Coccygodynia   2. DDD (degenerative disc disease), lumbar   3. Grade 1 Anterolisthesis of L4 over L5 (5 mm)   4. Chronic low back pain (Primary Area of Pain) (Right)   5. Chronic anticoagulation (Eliquis)   6. History of allergy to latex    Pain Score: Pre-procedure: 9 /10 Post-procedure: 0-No pain/10   Pre-op Assessment:  Ms. Hollender is a 86 y.o. (year old), female patient, seen today for interventional treatment. She  has a past surgical history that includes Cataract extraction; Partial hysterectomy; Appendectomy; and Tonsillectomy. Ms. Baz has a current medication list which includes the following prescription(s): acetaminophen, apixaban, vitamin  d3, duloxetine, fluticasone, furosemide, multivitamin, ocuvite eye health formula, omeprazole, sotalol, synthroid, tramadol, vitamin b-12, and aspirin. Her primarily concern today is the Back Pain (low, coccyx)  Initial Vital Signs:  Pulse/HCG Rate: (!) 50ECG Heart Rate: (!) 53 Temp: (!) 97.4 F (36.3 C) Resp: 16 BP: (!) 141/63 SpO2: 97 %  BMI: Estimated body mass index is 23.23 kg/m as calculated from the following:   Height as of this encounter: 4\' 11"  (1.499 m).   Weight as of this encounter: 115 lb (52.2 kg).  Risk Assessment: Allergies: Reviewed. She is allergic to hydrocodone; ciprofloxacin; and latex.  Allergy Precautions: None required Coagulopathies: Reviewed. None identified.  Blood-thinner therapy: None at this time Active Infection(s): Reviewed. None identified. Ms. Marlar is afebrile  Site Confirmation: Ms. Blanche was asked to confirm the procedure and laterality before marking the site Procedure checklist: Completed Consent: Before the procedure and under the influence of no sedative(s), amnesic(s), or anxiolytics, the patient was informed of the treatment options, risks and possible complications. To fulfill our ethical and legal obligations, as recommended by the American Medical Association's Code of Ethics, I have informed the patient of my clinical impression; the nature and purpose of the treatment or procedure; the risks, benefits, and possible complications of the intervention; the alternatives, including doing nothing; the risk(s) and benefit(s) of the alternative treatment(s) or procedure(s); and the risk(s) and benefit(s) of doing nothing. The patient was provided information about the general risks and possible complications associated with the procedure. These may include, but are not limited to: failure to achieve desired goals, infection, bleeding, organ or nerve damage, allergic reactions, paralysis, and death. In addition, the patient was informed of those risks  and complications associated to Spine-related procedures, such as failure to decrease pain; infection (i.e.: Meningitis, epidural or intraspinal abscess); bleeding (i.e.: epidural hematoma, subarachnoid hemorrhage, or any other type of intraspinal or peri-dural bleeding); organ or nerve damage (i.e.: Any type of peripheral nerve, nerve root, or spinal cord injury) with subsequent damage to sensory, motor, and/or autonomic systems, resulting in permanent pain, numbness, and/or weakness of one or several areas of the body; allergic reactions; (i.e.: anaphylactic reaction); and/or death. Furthermore, the patient was informed of those risks and complications associated with the medications. These include, but are not limited to: allergic reactions (i.e.: anaphylactic or anaphylactoid reaction(s)); adrenal axis suppression; blood sugar elevation that in diabetics may result in ketoacidosis or comma; water retention that in patients with history of congestive heart failure may result in shortness of breath, pulmonary edema, and decompensation with resultant heart failure; weight gain; swelling or edema; medication-induced neural toxicity; particulate matter embolism and blood vessel occlusion with resultant organ, and/or nervous system infarction; and/or aseptic necrosis of one or more joints. Finally, the patient was informed that Medicine is not an exact science; therefore, there is also the possibility of unforeseen or unpredictable risks and/or possible complications that may result in a catastrophic outcome. The patient indicated having understood very clearly. We have given the patient no guarantees and we have made no promises. Enough time was given to the patient to ask questions, all of which were answered to the patient's satisfaction. Ms. Terlizzi has indicated that she wanted to continue with the procedure. Attestation: I, the ordering provider, attest that I have discussed with the patient the benefits,  risks, side-effects, alternatives, likelihood of achieving goals, and potential problems during recovery for the procedure that I have provided informed consent. Date  Time: 07/21/2019 12:13 PM  Pre-Procedure Preparation:  Monitoring: As per clinic protocol. Respiration, ETCO2, SpO2, BP, heart rate and rhythm monitor placed and checked for adequate function Safety Precautions: Patient was assessed for positional comfort and pressure points before starting the procedure. Time-out: I initiated and conducted the "Time-out" before starting the procedure, as per protocol. The patient was asked to participate by confirming the accuracy of the "Time Out" information. Verification of the correct person, site, and procedure were performed and confirmed by me, the nursing staff, and the patient. "Time-out" conducted as per Joint Commission's Universal Protocol (UP.01.01.01). Time: 1239  Description of Procedure:          Target Area: Caudal Epidural Canal. Approach: Midline approach. Area Prepped: Entire Posterior Sacrococcygeal Region Prepping solution: DuraPrep (Iodine Povacrylex [0.7% available iodine] and Isopropyl Alcohol, 74% w/w) Safety Precautions: Aspiration looking for blood return was conducted prior to all injections. At no point did we inject any substances, as a needle was being advanced. No attempts were made at seeking any paresthesias. Safe injection practices and needle disposal techniques used. Medications properly checked for expiration dates. SDV (single dose vial) medications used. Description of the Procedure: Protocol guidelines were followed. The patient was placed in position over the fluoroscopy table. The target area was identified and the area prepped in the usual manner. Skin & deeper tissues infiltrated with local anesthetic. Appropriate amount of time allowed to pass for local anesthetics to take effect. The procedure needles were then advanced to the target area. Proper needle  placement secured. Negative aspiration confirmed. Solution injected in intermittent fashion, asking for systemic symptoms every 0.5cc of injectate. The needles were then removed and the area cleansed, making sure to leave some of the prepping  solution back to take advantage of its long term bactericidal properties. Vitals:   07/21/19 1212 07/21/19 1235 07/21/19 1237 07/21/19 1246  BP: (!) 141/63 (!) 159/48 (!) 110/93 (!) 143/49  Pulse: (!) 50     Resp: 16 (!) 22 18 (!) 21  Temp: (!) 97.4 F (36.3 C)     SpO2: 97% 98% 98% 97%  Weight: 115 lb (52.2 kg)     Height: 4\' 11"  (1.499 m)       Start Time: 1239 hrs. End Time: 1246 hrs. Materials:  Needle(s) Type: Epidural needle Gauge: 17G Length: 3.5-in Medication(s): Please see orders for medications and dosing details.  Imaging Guidance (Spinal):          Type of Imaging Technique: Fluoroscopy Guidance (Spinal) Indication(s): Assistance in needle guidance and placement for procedures requiring needle placement in or near specific anatomical locations not easily accessible without such assistance. Exposure Time: Please see nurses notes. Contrast: Before injecting any contrast, we confirmed that the patient did not have an allergy to iodine, shellfish, or radiological contrast. Once satisfactory needle placement was completed at the desired level, radiological contrast was injected. Contrast injected under live fluoroscopy. No contrast complications. See chart for type and volume of contrast used. Fluoroscopic Guidance: I was personally present during the use of fluoroscopy. "Tunnel Vision Technique" used to obtain the best possible view of the target area. Parallax error corrected before commencing the procedure. "Direction-depth-direction" technique used to introduce the needle under continuous pulsed fluoroscopy. Once target was reached, antero-posterior, oblique, and lateral fluoroscopic projection used confirm needle placement in all planes.  Images permanently stored in EMR. Interpretation: I personally interpreted the imaging intraoperatively. Adequate needle placement confirmed in multiple planes. Appropriate spread of contrast into desired area was observed. No evidence of afferent or efferent intravascular uptake. No intrathecal or subarachnoid spread observed. Permanent images saved into the patient's record.  Antibiotic Prophylaxis:   Anti-infectives (From admission, onward)   None     Indication(s): None identified  Post-operative Assessment:  Post-procedure Vital Signs:  Pulse/HCG Rate: (!) 50(!) 48 Temp: (!) 97.4 F (36.3 C) Resp: (!) 21 BP: (!) 143/49 SpO2: 97 %  EBL: None  Complications: No immediate post-treatment complications observed by team, or reported by patient.  Note: The patient tolerated the entire procedure well. A repeat set of vitals were taken after the procedure and the patient was kept under observation following institutional policy, for this type of procedure. Post-procedural neurological assessment was performed, showing return to baseline, prior to discharge. The patient was provided with post-procedure discharge instructions, including a section on how to identify potential problems. Should any problems arise concerning this procedure, the patient was given instructions to immediately contact us, at any time, without hesitation. In any case, we plan to contact the patient by telephone for a follow-up status report regarding this interventional procedure.  Comments:  No additional relevant information.  Plan of Care  Orders:  Orders Placed This Encounter  Procedures  . Caudal Epidural Injection    Scheduling Instructions:     Laterality: Midline     Level(s): Sacrococcygeal canal (Tailbone area)     Sedation: No Sedation     Timeframe: Today    Order Specific Question:   Where will this procedure be performed?    Answer:   [facility]  . DG [facility] C-ARM 1-60 MIN NO  REPORT    Intraoperative interpretation by procedural physician at [place].    Standing Status:  Standing    Number of Occurrences:   1    Order Specific Question:   Reason for exam:    Answer:   Assistance in needle guidance and placement for procedures requiring needle placement in or near specific anatomical locations not easily accessible without such assistance.  . Informed Consent Details: Physician/Practitioner Attestation; Transcribe to consent form and obtain patient signature    Nursing Order: Transcribe to consent form and obtain patient signature. Note: Always confirm laterality of pain with Ms. Malsch, before procedure. Procedure: Caudal epidural steroid injection Indication/Reason: Low back pain and lower extremity pain secondary to lumbosacral radiculitis Provider Attestation: I, [place] Dossie Arbour, MD, (Pain Management Specialist), the physician/practitioner, attest that I have discussed with the patient the benefits, risks, side effects, alternatives, likelihood of achieving goals and potential problems during recovery for the procedure that I have provided informed consent.  . Care order/instruction: Please confirm that the patient has stopped the Eliquis (Apixaban) x 3 days prior to procedure or surgery.    Please confirm that the patient has stopped the Eliquis (Apixaban) x 3 days prior to procedure or surgery.    Standing Status:   Standing    Number of Occurrences:   1  . Provide equipment / supplies at bedside    Equipment required: Single use, disposable, "Epidural Tray" Epidural Catheter: NOT required    Standing Status:   Standing    Number of Occurrences:   1    Order Specific Question:   Specify    Answer:   Epidural Tray  . Bleeding precautions    Standing Status:   Standing    Number of Occurrences:   1  . Latex precautions    Activate Latex-Free Protocol.    Standing Status:   Standing    Number of Occurrences:   1   Chronic Opioid  Analgesic:  Tramadol 50 mg, 1 tablet PO q 6 hrs (200 mg/day of tramadol) MME/day: 20 mg/day.   Medications ordered for procedure: Meds ordered this encounter  Medications  . iohexol (OMNIPAQUE) 180 MG/ML injection 10 mL    Must be Myelogram-compatible. If not available, you may substitute with a water-soluble, non-ionic, hypoallergenic, myelogram-compatible radiological contrast medium.  Marland Kitchen lidocaine (XYLOCAINE) 2 % (with pres) injection 400 mg  . sodium chloride flush (NS) 0.9 % injection 2 mL  . ropivacaine (PF) 2 mg/mL (0.2%) (NAROPIN) injection 2 mL  . triamcinolone acetonide (KENALOG-40) injection 40 mg   Medications administered: We administered iohexol, lidocaine, sodium chloride flush, ropivacaine (PF) 2 mg/mL (0.2%), and triamcinolone acetonide.  See the medical record for exact dosing, route, and time of administration.  Follow-up plan:   Return in about 2 weeks (around 08/04/2019) for (VV), (PP).       Considering: NOTE:Stop Eliquisfor 3 daysprior to aprocedure. Diagnostic/therapeutic caudal epidural steroid injection #1 under fluoroscopic guidance.   Palliative PRN treatment(s): Palliative/Therapeuticright lumbar facet #2+right-sidedSIjoint RFA#1 Palliative right lumbar facet block#4 Palliative right sacroiliac joint block#4     Recent Visits Date Type Provider Dept  07/08/19 Telemedicine Milinda Pointer, MD [facility]  06/03/19 Telemedicine Milinda Pointer, MD [facility]  Showing recent visits within past 90 days and meeting all other requirements   Today's Visits Date Type Provider Dept  07/21/19 Procedure visit Milinda Pointer, MD [facility]  Showing today's visits and meeting all other requirements   Future Appointments Date Type Provider Dept  08/05/19 Appointment Milinda Pointer, MD [facility]  09/21/19 Appointment Milinda Pointer, MD  [facility]  Showing future  appointments within next 90 days and meeting all other requirements   Disposition: Discharge home  Discharge (Date  Time): 07/21/2019; 1252 hrs.   Primary Care Physician: [name], MD Location: [facility] Note by: Gaspar Cola, MD Date: 07/21/2019; Time: 1:06 PM  Disclaimer:  Medicine is not an Chief Strategy Officer. The only guarantee in medicine is that nothing is guaranteed. It is important to note that the decision to proceed with this intervention was based on the information collected from the patient. The Data and conclusions were drawn from the patient's questionnaire, the interview, and the physical examination. Because the information was provided in large part by the patient, it cannot be guaranteed that it has not been purposely or unconsciously manipulated. Every effort has been made to obtain as much relevant data as possible for this evaluation. It is important to note that the conclusions that lead to this procedure are derived in large part from the available data. Always take into account that the treatment will also be dependent on availability of resources and existing treatment guidelines, considered by other Pain Management Practitioners as being common knowledge and practice, at the time of the intervention. For Medico-Legal purposes, it is also important to point out that variation in procedural techniques and pharmacological choices are the acceptable norm. The indications, contraindications, technique, and results of the above procedure should only be interpreted and judged by a Board-Certified Interventional Pain Specialist with extensive familiarity and expertise in the same exact procedure and technique.

## 2019-07-21 NOTE — Patient Instructions (Signed)

## 2019-07-22 ENCOUNTER — Telehealth: Payer: Self-pay

## 2019-07-22 NOTE — Telephone Encounter (Signed)
Post procedure phone call.  LM 

## 2019-08-04 ENCOUNTER — Encounter: Payer: Self-pay | Admitting: Pain Medicine

## 2019-08-04 ENCOUNTER — Telehealth: Payer: Self-pay | Admitting: Family Medicine

## 2019-08-04 NOTE — Telephone Encounter (Signed)
Patient wants to know if she can take  the covid vaccine because she is on blood thinner.  Amanda Soto,cma

## 2019-08-04 NOTE — Telephone Encounter (Signed)
Pt called with some questions regarding the covid vaccine concerning her blood thinner medication. Please advise and thank you!  Call pt @ [phone].

## 2019-08-04 NOTE — Progress Notes (Signed)
Patient: Amanda Soto  Service Category: E/M  Provider: Gaspar Cola, MD  DOB: 23-Nov-1924  DOS: 08/05/2019  Location: Office  MRN: [id number]  Setting: Ambulatory outpatient  Referring Provider: [name], MD  Type: Established Patient  Specialty: Interventional Pain Management  PCP: [name], MD  Location: Remote location  Delivery: TeleHealth     Virtual Encounter - Pain Management PROVIDER NOTE: Information contained herein reflects review and annotations entered in association with encounter. Interpretation of such information and data should be left to medically-trained personnel. Information provided to patient can be located elsewhere in the medical record under "Patient Instructions". Document created using STT-dictation technology, any transcriptional errors that may result from process are unintentional.    Contact & Pharmacy Preferred: [place]: [phone] (home) Mobile: [phone] (mobile) E-mail: [name]  [id number] - [place], [place] - [address] [place] [address] Phone: [phone] Fax: [phone]   Pre-screening  Ms. Poyer offered "in-person" vs "virtual" encounter. She indicated preferring virtual for this encounter.   Reason COVID-19*  Social distancing based on CDC and AMA recommendations.   I contacted AMERRA GRASSO on 08/05/2019 via telephone.      I clearly identified myself as Gaspar Cola, MD. I verified that I was speaking with the correct person using two identifiers (Name: TAILA MCALHANY, and date of birth: Nov 13, 1924).  Consent I sought verbal advanced consent from [name] for virtual visit interactions. I informed Ms. Popowski of possible security and privacy concerns, risks, and limitations associated with providing "not-in-person" medical evaluation and management services. I also informed Ms. Haskett of the availability of "in-person" appointments. Finally, I informed  her that there would be a charge for the virtual visit and that she could be  personally, fully or partially, financially responsible for it. Ms. Archuleta expressed understanding and agreed to proceed.   Historic Elements   Ms. CHELBY SHARF is a 86 y.o. year old, female patient evaluated today after her last encounter by our practice on 07/22/2019. Ms. Rossiter  has a past medical history of Acute postoperative pain (12/17/2016), Anemia (11/10/2015), Arthritis, Atrial fibrillation ([place]), CHF (congestive heart failure) ([place]), Chickenpox, Chronic abdominal pain (01/16/2014), Closed Colles' fracture (12/17/2016), Degenerative arthritis of hip (08/19/2014), Degenerative arthritis of lumbar spine (11/18/2013), Depression, Diverticulitis, GERD (gastroesophageal reflux disease), Heart murmur, Heart rate slow, Hyperlipidemia, Hypertension, Hypothyroid, Neuritis or radiculitis due to rupture of lumbar intervertebral disc (11/18/2013), Skin cancer, Symptomatic anemia (11/09/2015), and Trochanteric bursitis of right hip (11/18/2013). She also  has a past surgical history that includes Cataract extraction; Partial hysterectomy; Appendectomy; and Tonsillectomy. Ms. Downum has a current medication list which includes the following prescription(s): acetaminophen, apixaban, vitamin d3, fluticasone, furosemide, ocuvite eye health formula, omeprazole, sotalol, synthroid, tramadol, vitamin b-12, aspirin, duloxetine, and multivitamin. She  reports that she has never smoked. She has never used smokeless tobacco. She reports current alcohol use of about 1.0 standard drinks of alcohol per week. She reports that she does not use drugs. Ms. Crotwell is allergic to hydrocodone; ciprofloxacin; and latex.   HPI  Today, she is being contacted for a post-procedure assessment.  The patient indicates that it took 9 to 10 days for the pain to get better.  Today we talked about that and I let her know that usually that means that there was a lot of swelling  involved in the area and that is why it took so long.  I have encouraged her to  call early next time so that it will not take that long for her to get the benefit.  She is scheduled to have her COVID-19 vaccine tomorrow and she asked if it would be okay to have it done now since it has been more than 2 weeks since the injection.  I told her that it would be just fine and to go ahead and have her COVID-19 vaccine.  Post-Procedure Evaluation  Procedure: Diagnostic caudal ESI #1 under fluoroscopic guidance, no sedation Pre-procedure pain level: 9/10 Post-procedure: 0/10 (100% relief)  Sedation: None.  Landis Martins, RN  08/04/2019  9:49 AM  Sign when Signing Visit Pain relief after procedure (treated area only): (Questions asked to patient) 1. Starting about 15 minutes after the procedure, and "while the area was still numb" (from the local anesthetics), were you having any of your usual pain "in that area" (the treated area)?  (NOTE: NOT including the discomfort from the needle sticks.) First 1 hour: 100 % better. First 4-6 hours: 100 % better. 2. How long did the numbness from the local anesthetics last? (More than 6 hours?) Duration: 12 hours.  3. How much better is your pain now, when compared to before the procedure? Current benefit: 90 % better. 4. Can you move better now? Improvement in ROM (Range of Motion): Yes. 5. Can you do more now? Improvement in function: Yes. 4. Did you have any problems with the procedure? Side-effects/Complications: No.  Current benefits: Defined as benefit that persist at this time.   Analgesia:  90-100% better Function: Ms. Gagen reports improvement in function ROM: Ms. Claridge reports improvement in ROM  Pharmacotherapy Assessment  Analgesic: Tramadol 50 mg, 1 tablet PO q 6 hrs (200 mg/day of tramadol) MME/day: 20 mg/day.   Monitoring: Pharmacotherapy: No side-effects or adverse reactions reported. [place] PMP: PDMP reviewed during this encounter.        Compliance: No problems identified. Effectiveness: Clinically acceptable. Plan: Refer to "POC".  UDS: No results found for: SUMMARY Laboratory Chemistry Profile (12 mo)  Renal: 03/30/2019: BUN/Creatinine Ratio 28 06/16/2019: BUN 15; Creatinine, Ser 0.53  Lab Results  Component Value Date   GFR 107.81 06/16/2019   GFRAA 99 03/30/2019   GFRNONAA 86 03/30/2019   Hepatic: 06/16/2019: Albumin 4.1 Lab Results  Component Value Date   AST 20 06/16/2019   ALT 15 06/16/2019   Other: 03/30/2019: 25-Hydroxy, Vitamin D 14; 25-Hydroxy, Vitamin D-2 <1.0; 25-Hydroxy, Vitamin D-3 14; CRP <1; Sed Rate 50 06/16/2019: Vitamin B-12 >1500; VITD 25.80  Note: Above Lab results reviewed.  Imaging  DG PAIN CLINIC C-ARM 1-60 MIN NO REPORT Fluoro was used, but no Radiologist interpretation will be provided.  Please refer to "NOTES" tab for provider progress note.   Assessment  There were no encounter diagnoses.  Plan of Care  Problem-specific:  No problem-specific Assessment & Plan notes found for this encounter.  I am having [name] maintain her acetaminophen, sotalol, multivitamin, Ocuvite Eye Health Formula, furosemide, vitamin B-12, fluticasone, apixaban, Synthroid, traMADol, Vitamin D3, aspirin, omeprazole, and DULoxetine.  Pharmacotherapy (Medications Ordered): No orders of the defined types were placed in this encounter.  Orders:  No orders of the defined types were placed in this encounter.  Follow-up plan:   Return in about 8 weeks (around 09/30/2019) for (VV), (MM).      Considering: NOTE:Stop Eliquisfor 3 daysprior to aprocedure.    Palliative PRN treatment(s): Palliative/therapeutic caudal ESI #2 (100/100/90/90) Palliative/Therapeuticright lumbar facet RFA #3(last done 03/27/2018) (100/100/100/90) (100/90/0) Palliative/therapeutic  right-sidedSIjoint RFA#1 (last done 03/27/2018) (100/90/0) Palliative right lumbar facet block#4(100/100/90/75) Palliative  right sacroiliac joint block#4    Recent Visits Date Type Provider Dept  07/21/19 Procedure visit Milinda Pointer, MD [facility]  07/08/19 Telemedicine Milinda Pointer, MD [facility]  06/03/19 Telemedicine Milinda Pointer, MD [facility]  Showing recent visits within past 90 days and meeting all other requirements   Today's Visits Date Type Provider Dept  08/05/19 Telemedicine Milinda Pointer, MD [facility]  Showing today's visits and meeting all other requirements   Future Appointments Date Type Provider Dept  09/21/19 Appointment Milinda Pointer, MD [facility]  Showing future appointments within next 90 days and meeting all other requirements   I discussed the assessment and treatment plan with the patient. The patient was provided an opportunity to ask questions and all were answered. The patient agreed with the plan and demonstrated an understanding of the instructions.  Patient advised to call back or seek an in-person evaluation if the symptoms or condition worsens.  Duration of encounter: 15 minutes.  Note by: Gaspar Cola, MD Date: 08/05/2019; Time: 6:55 AM

## 2019-08-04 NOTE — Progress Notes (Signed)
Pain relief after procedure (treated area only): (Questions asked to patient) 1. Starting about 15 minutes after the procedure, and "while the area was still numb" (from the local anesthetics), were you having any of your usual pain "in that area" (the treated area)?  (NOTE: NOT including the discomfort from the needle sticks.) First 1 hour: 100 % better. First 4-6 hours: 100 % better. 2. How long did the numbness from the local anesthetics last? (More than 6 hours?) Duration: 12 hours.  3. How much better is your pain now, when compared to before the procedure? Current benefit: 90 % better. 4. Can you move better now? Improvement in ROM (Range of Motion): Yes. 5. Can you do more now? Improvement in function: Yes. 4. Did you have any problems with the procedure? Side-effects/Complications: No.

## 2019-08-05 ENCOUNTER — Other Ambulatory Visit: Payer: Self-pay

## 2019-08-05 ENCOUNTER — Ambulatory Visit: Payer: Medicare Other | Attending: Pain Medicine | Admitting: Pain Medicine

## 2019-08-05 DIAGNOSIS — G894 Chronic pain syndrome: Secondary | ICD-10-CM | POA: Diagnosis not present

## 2019-08-05 DIAGNOSIS — M545 Low back pain: Secondary | ICD-10-CM

## 2019-08-05 DIAGNOSIS — M533 Sacrococcygeal disorders, not elsewhere classified: Secondary | ICD-10-CM

## 2019-08-05 DIAGNOSIS — G8929 Other chronic pain: Secondary | ICD-10-CM

## 2019-08-05 NOTE — Telephone Encounter (Signed)
She can get the vaccine. There is some increased risk of bleeding or heamtoma at the site of injection, though I do not think that should keep her from getting this.

## 2019-08-06 NOTE — Telephone Encounter (Signed)
I called and informed the patient that the provider stated she should get the vaccine that being on a blood thinner should not bother her and that there is an increase of bleeing or hematoma but it was still ok to get it.   She understood.  Shaneta Cervenka,cma

## 2019-08-10 ENCOUNTER — Other Ambulatory Visit: Payer: Self-pay

## 2019-08-12 ENCOUNTER — Other Ambulatory Visit (INDEPENDENT_AMBULATORY_CARE_PROVIDER_SITE_OTHER): Payer: Medicare Other

## 2019-08-12 ENCOUNTER — Other Ambulatory Visit: Payer: Self-pay

## 2019-08-12 DIAGNOSIS — E871 Hypo-osmolality and hyponatremia: Secondary | ICD-10-CM | POA: Diagnosis not present

## 2019-08-12 DIAGNOSIS — D582 Other hemoglobinopathies: Secondary | ICD-10-CM | POA: Diagnosis not present

## 2019-08-12 LAB — BASIC METABOLIC PANEL
BUN: 18 mg/dL (ref 6–23)
CO2: 31 mEq/L (ref 19–32)
Calcium: 9.7 mg/dL (ref 8.4–10.5)
Chloride: 96 mEq/L (ref 96–112)
Creatinine, Ser: 0.58 mg/dL (ref 0.40–1.20)
GFR: 97.12 mL/min (ref >60.00)
Glucose, Bld: 106 mg/dL — ABNORMAL HIGH (ref 70–99)
Potassium: 4.2 mEq/L (ref 3.5–5.1)
Sodium: 132 mEq/L — ABNORMAL LOW (ref 135–145)

## 2019-08-12 LAB — CBC
HCT: 43.9 % (ref 36.0–46.0)
Hemoglobin: 14.3 g/dL (ref 12.0–15.0)
MCHC: 32.5 g/dL (ref 30.0–36.0)
MCV: 97.9 fl (ref 78.0–100.0)
Platelets: 230 10*3/uL (ref 150.0–400.0)
RBC: 4.48 Mil/uL (ref 3.87–5.11)
RDW: 15 % (ref 11.5–15.5)
WBC: 8.2 10*3/uL (ref 4.0–10.5)

## 2019-08-18 ENCOUNTER — Other Ambulatory Visit: Payer: Self-pay | Admitting: Family Medicine

## 2019-08-20 ENCOUNTER — Other Ambulatory Visit: Payer: Medicare Other

## 2019-08-24 DIAGNOSIS — H353131 Nonexudative age-related macular degeneration, bilateral, early dry stage: Secondary | ICD-10-CM | POA: Diagnosis not present

## 2019-08-25 DIAGNOSIS — R011 Cardiac murmur, unspecified: Secondary | ICD-10-CM | POA: Diagnosis not present

## 2019-08-25 DIAGNOSIS — I480 Paroxysmal atrial fibrillation: Secondary | ICD-10-CM | POA: Diagnosis not present

## 2019-08-25 DIAGNOSIS — I10 Essential (primary) hypertension: Secondary | ICD-10-CM | POA: Diagnosis not present

## 2019-08-25 DIAGNOSIS — Z79899 Other long term (current) drug therapy: Secondary | ICD-10-CM | POA: Diagnosis not present

## 2019-08-25 DIAGNOSIS — R531 Weakness: Secondary | ICD-10-CM | POA: Diagnosis not present

## 2019-09-06 ENCOUNTER — Other Ambulatory Visit: Payer: Self-pay | Admitting: Family Medicine

## 2019-09-07 ENCOUNTER — Ambulatory Visit: Payer: Medicare Other | Admitting: Podiatry

## 2019-09-07 DIAGNOSIS — R011 Cardiac murmur, unspecified: Secondary | ICD-10-CM | POA: Diagnosis not present

## 2019-09-09 ENCOUNTER — Ambulatory Visit (INDEPENDENT_AMBULATORY_CARE_PROVIDER_SITE_OTHER): Payer: Medicare Other

## 2019-09-09 ENCOUNTER — Ambulatory Visit (INDEPENDENT_AMBULATORY_CARE_PROVIDER_SITE_OTHER): Payer: Medicare Other | Admitting: Family Medicine

## 2019-09-09 ENCOUNTER — Encounter: Payer: Self-pay | Admitting: Family Medicine

## 2019-09-09 ENCOUNTER — Other Ambulatory Visit: Payer: Self-pay

## 2019-09-09 VITALS — Ht 59.0 in | Wt 115.0 lb

## 2019-09-09 VITALS — Ht 59.5 in | Wt 113.0 lb

## 2019-09-09 DIAGNOSIS — R21 Rash and other nonspecific skin eruption: Secondary | ICD-10-CM

## 2019-09-09 DIAGNOSIS — J0100 Acute maxillary sinusitis, unspecified: Secondary | ICD-10-CM

## 2019-09-09 DIAGNOSIS — M810 Age-related osteoporosis without current pathological fracture: Secondary | ICD-10-CM | POA: Diagnosis not present

## 2019-09-09 DIAGNOSIS — R5382 Chronic fatigue, unspecified: Secondary | ICD-10-CM | POA: Diagnosis not present

## 2019-09-09 DIAGNOSIS — Z0000 Encounter for general adult medical examination without abnormal findings: Secondary | ICD-10-CM | POA: Diagnosis not present

## 2019-09-09 DIAGNOSIS — J329 Chronic sinusitis, unspecified: Secondary | ICD-10-CM | POA: Insufficient documentation

## 2019-09-09 DIAGNOSIS — F3341 Major depressive disorder, recurrent, in partial remission: Secondary | ICD-10-CM

## 2019-09-09 MED ORDER — TRIAMCINOLONE ACETONIDE 0.1 % EX CREA: 1.0000 "application " | TOPICAL_CREAM | Freq: Two times a day (BID) | CUTANEOUS | 0 refills | Status: DC

## 2019-09-09 MED ORDER — AMOXICILLIN-POT CLAVULANATE 875-125 MG PO TABS: 1.0000 | ORAL_TABLET | Freq: Two times a day (BID) | ORAL | 0 refills | Status: DC

## 2019-09-09 NOTE — Progress Notes (Signed)
Virtual Visit via telephone Note  This visit type was conducted due to national recommendations for restrictions regarding the COVID-19 pandemic (e.g. social distancing).  This format is felt to be most appropriate for this patient at this time.  All issues noted in this document were discussed and addressed.  No physical exam was performed (except for noted visual exam findings with Video Visits).   I connected with Amanda Soto today at  2:15 PM EST by telephone and verified that I am speaking with the correct person using two identifiers. Location patient: home Location provider: work Persons participating in the virtual visit: patient, provider  I discussed the limitations, risks, security and privacy concerns of performing an evaluation and management service by telephone and the availability of in person appointments. I also discussed with the patient that there may be a patient responsible charge related to this service. The patient expressed understanding and agreed to proceed.  Interactive audio and video telecommunications were attempted between this provider and patient, however failed, due to patient having technical difficulties OR patient did not have access to video capability.  We continued and completed visit with audio only.   Reason for visit: f/u  HPI: Depression: denies symptoms. Didn't start the cymbalta as she felt she did not need it.   Fatigue: chronic issue. Feels like she needs to sleep lots. Up to 12 hours sleep per night.  She feels as if her back pain is contributing as she does not have any back pain when she is laying down.  She takes tramadol just about once a day.  She does take sotalol.  Rash: Patient notes this is on her chest.  It is where she wears her life alert button.  It has been there a while.  It is scaly.  No itching.  No pain.  She has not tried any medicine for this.  Sinus congestion: Patient notes this is in her maxillary sinus on the left.   Feels like a sinus infection.  Its been present for a week.  She has had some runny nose and cough.  Some sore throat.  Yellow mucus from her nose.  No loss of taste or smell.  She is status post COVID-19 vaccine.   ROS: See pertinent positives and negatives per HPI.  Past Medical History:  Diagnosis Date  . Acute postoperative pain 12/17/2016  . Anemia 11/10/2015  . Arthritis   . Atrial fibrillation ([place])   . CHF (congestive heart failure) ([place])   . Chickenpox   . Chronic abdominal pain 01/16/2014  . Closed Colles' fracture 12/17/2016  . Degenerative arthritis of hip 08/19/2014  . Degenerative arthritis of lumbar spine 11/18/2013  . Depression   . Diverticulitis   . GERD (gastroesophageal reflux disease)   . Heart murmur   . Heart rate slow   . Hyperlipidemia   . Hypertension   . Hypothyroid   . Neuritis or radiculitis due to rupture of lumbar intervertebral disc 11/18/2013  . Skin cancer   . Symptomatic anemia 11/09/2015  . Trochanteric bursitis of right hip 11/18/2013    Past Surgical History:  Procedure Laterality Date  . APPENDECTOMY    . CATARACT EXTRACTION    . PARTIAL HYSTERECTOMY    . TONSILLECTOMY      Family History  Problem Relation Age of Onset  . Stroke Maternal Grandmother   . Breast cancer Mother   . Breast cancer Sister   . Stroke Sister   . Multiple sclerosis Sister   .  Heart disease Other        Parent, grandparent, sons  . Hypertension Other        Parent, grandparent    SOCIAL HX: Non-smoker   Current Outpatient Medications:  .  acetaminophen (TYLENOL) 325 MG tablet, Take 500 mg by mouth every 6 (six) hours as needed for moderate pain, fever or headache. , Disp: , Rfl:  .  apixaban (ELIQUIS) 2.5 MG TABS tablet, Take 1 tablet (2.5 mg total) by mouth 2 (two) times daily., Disp: 60 tablet, Rfl: 11 .  Cholecalciferol (VITAMIN D3) 50 MCG (2000 UT) CHEW, Chew 1 tablet by mouth daily., Disp: , Rfl:  .  fluticasone (FLONASE) 50 MCG/ACT nasal spray, SPRAY  2 SPRAYS INTO EACH NOSTRIL EVERY DAY, Disp: , Rfl:  .  furosemide (LASIX) 20 MG tablet, TAKE 1 TABLET BY MOUTH EVERY DAY, Disp: 90 tablet, Rfl: 1 .  Multiple Vitamins-Minerals (OCUVITE EYE HEALTH FORMULA) CAPS, Take 1 capsule by mouth daily., Disp: , Rfl:  .  omeprazole (PRILOSEC) 40 MG capsule, TAKE 1 CAPSULE BY MOUTH EVERY DAY 15-20 MINUTES BEFORE MEALS, Disp: , Rfl:  .  sotalol (BETAPACE) 80 MG tablet, Take 40 mg by mouth 2 (two) times daily. , Disp: , Rfl:  .  SYNTHROID 50 MCG tablet, TAKE 1 TABLET BY MOUTH EVERY DAY BEFORE BREAKFAST, Disp: 90 tablet, Rfl: 1 .  traMADol (ULTRAM) 50 MG tablet, Take 1 tablet (50 mg total) by mouth 4 (four) times daily., Disp: 120 tablet, Rfl: 5 .  vitamin B-12 (CYANOCOBALAMIN) 1000 MCG tablet, Take by mouth., Disp: , Rfl:  .  amoxicillin-clavulanate (AUGMENTIN) 875-125 MG tablet, Take 1 tablet by mouth 2 (two) times daily., Disp: 14 tablet, Rfl: 0 .  triamcinolone cream (KENALOG) 0.1 %, Apply 1 application topically 2 (two) times daily., Disp: 30 g, Rfl: 0  EXAM: This is a telehealth telephone visit and thus no physical exam was completed.  ASSESSMENT AND PLAN:  Discussed the following assessment and plan:  Rash Possible contact dermatitis related to her life alert button.  We will trial triamcinolone.  If not improving she will let us know.  Recurrent major depressive disorder, in partial remission (HCC) Asymptomatic.  Not on medication.  She will monitor.  Chronic fatigue Prior lab evaluation unremarkable.  This could be related to her sotalol.  I encouraged her to discuss that with her cardiologist.  OP (osteoporosis) Patient is due for a DEXA scan.  Order placed.  She will call to schedule.  She will continue calcium and vitamin D.  Sinusitis Symptoms concerning for sinusitis.  Discussed that we could not rule out COVID-19 without a test.  She declines testing at this time.  We will treat with Augmentin.  If not improving over the next several  days with antibiotics she will let us know.  Discussed staying quarantine at home until she starts to improve with antibiotics.   Orders Placed This Encounter  Procedures  . DG Bone Density    Standing Status:   Future    Standing Expiration Date:   11/08/2020    Order Specific Question:   Reason for Exam (SYMPTOM  OR DIAGNOSIS REQUIRED)    Answer:   osteoporosis follow-up    Order Specific Question:   Preferred imaging location?    Answer:   [facility] ordered this encounter  Medications  . triamcinolone cream (KENALOG) 0.1 %    Sig: Apply 1 application topically 2 (two) times daily.  Dispense:  30 g    Refill:  0  . amoxicillin-clavulanate (AUGMENTIN) 875-125 MG tablet    Sig: Take 1 tablet by mouth 2 (two) times daily.    Dispense:  14 tablet    Refill:  0     I discussed the assessment and treatment plan with the patient. The patient was provided an opportunity to ask questions and all were answered. The patient agreed with the plan and demonstrated an understanding of the instructions.   The patient was advised to call back or seek an in-person evaluation if the symptoms worsen or if the condition fails to improve as anticipated.  I provided 21 minutes of non-face-to-face time during this encounter.   [name], MD

## 2019-09-09 NOTE — Assessment & Plan Note (Signed)
Possible contact dermatitis related to her life alert button.  We will trial triamcinolone.  If not improving she will let us know.

## 2019-09-09 NOTE — Assessment & Plan Note (Signed)
Prior lab evaluation unremarkable.  This could be related to her sotalol.  I encouraged her to discuss that with her cardiologist.

## 2019-09-09 NOTE — Progress Notes (Signed)
I have reviewed the above note and agree.  Kasson Lamere, M.D.  

## 2019-09-09 NOTE — Patient Instructions (Addendum)
  Amanda Soto , Thank you for taking time to come for your Medicare Wellness Visit. I appreciate your ongoing commitment to your health goals. Please review the following plan we discussed and let me know if I can assist you in the future.   These are the goals we discussed: Goals      Patient Stated   . Healthy lifestyle (pt-stated)     Take medication for pain before strenuous activity        This is a list of the screening recommended for you and due dates:  Health Maintenance  Topic Date Due  . Tetanus Vaccine  02/10/1944  . Pneumonia vaccines (1 of 2 - PCV13) 02/11/1992  . Flu Shot  Completed  . DEXA scan (bone density measurement)  Completed

## 2019-09-09 NOTE — Assessment & Plan Note (Signed)
Asymptomatic.  Not on medication.  She will monitor.

## 2019-09-09 NOTE — Assessment & Plan Note (Addendum)
Patient is due for a DEXA scan.  Order placed.  She will call to schedule.  She will continue calcium and vitamin D.

## 2019-09-09 NOTE — Assessment & Plan Note (Signed)
Symptoms concerning for sinusitis.  Discussed that we could not rule out COVID-19 without a test.  She declines testing at this time.  We will treat with Augmentin.  If not improving over the next several days with antibiotics she will let us know.  Discussed staying quarantine at home until she starts to improve with antibiotics.

## 2019-09-09 NOTE — Progress Notes (Signed)
Subjective:   Amanda Soto is a 86 y.o. female who presents for Medicare Annual (Subsequent) preventive examination.  Review of Systems:  No ROS.  Medicare Wellness Virtual Visit.  Visual/audio telehealth visit, UTA vital signs.   Ht/Wt provided. See social history for additional risk factors.   Cardiac Risk Factors include: advanced age (>66men, >61 women);hypertension     Objective:     Vitals: Ht 4\' 11"  (1.499 m)   Wt 115 lb (52.2 kg)   BMI 23.23 kg/m   Body mass index is 23.23 kg/m.  Advanced Directives 09/09/2019 07/21/2019 03/05/2019 09/08/2018 03/27/2018 07/26/2017 04/24/2017  Does Patient Have a Medical Advance Directive? Yes Yes No Yes Yes Yes Yes  Type of Paramedic of [place];Living will [place];Living will - [place];Living will [place];Living will [place];Living will Living will  Does patient want to make changes to medical advance directive? No - Patient declined - - No - Patient declined - - -  Copy of [place] in Chart? No - copy requested - - No - copy requested - No - copy requested -  Would patient like information on creating a medical advance directive? - - No - Patient declined - - - -    Tobacco Social History   Tobacco Use  Smoking Status Never Smoker  Smokeless Tobacco Never Used     Counseling given: Not Answered   Clinical Intake:  Pre-visit preparation completed: Yes        Diabetes: No  How often do you need to have someone help you when you read instructions, pamphlets, or other written materials from your doctor or pharmacy?: 1 - Never  Interpreter Needed?: No     Past Medical History:  Diagnosis Date  . Acute postoperative pain 12/17/2016  . Anemia 11/10/2015  . Arthritis   . Atrial fibrillation ([place])   . CHF (congestive heart failure) ([place])   . Chickenpox   . Chronic abdominal pain 01/16/2014  .  Closed Colles' fracture 12/17/2016  . Degenerative arthritis of hip 08/19/2014  . Degenerative arthritis of lumbar spine 11/18/2013  . Depression   . Diverticulitis   . GERD (gastroesophageal reflux disease)   . Heart murmur   . Heart rate slow   . Hyperlipidemia   . Hypertension   . Hypothyroid   . Neuritis or radiculitis due to rupture of lumbar intervertebral disc 11/18/2013  . Skin cancer   . Symptomatic anemia 11/09/2015  . Trochanteric bursitis of right hip 11/18/2013   Past Surgical History:  Procedure Laterality Date  . APPENDECTOMY    . CATARACT EXTRACTION    . PARTIAL HYSTERECTOMY    . TONSILLECTOMY     Family History  Problem Relation Age of Onset  . Stroke Maternal Grandmother   . Breast cancer Mother   . Breast cancer Sister   . Stroke Sister   . Multiple sclerosis Sister   . Heart disease Other        Parent, grandparent, sons  . Hypertension Other        Parent, grandparent   Social History   Socioeconomic History  . Marital status: Widowed    Spouse name: Not on file  . Number of children: Not on file  . Years of education: Not on file  . Highest education level: Not on file  Occupational History  . Not on file  Tobacco Use  . Smoking status: Never  Smoker  . Smokeless tobacco: Never Used  Substance and Sexual Activity  . Alcohol use: Yes    Alcohol/week: 1.0 standard drinks    Types: 1 Glasses of wine per week    Comment: rare  . Drug use: No  . Sexual activity: Never  Other Topics Concern  . Not on file  Social History Narrative  . Not on file   Social Determinants of Health   Financial Resource Strain:   . Difficulty of Paying Living Expenses: Not on file  Food Insecurity:   . Worried About Charity fundraiser in the Last Year: Not on file  . Ran Out of Food in the Last Year: Not on file  Transportation Needs:   . Lack of Transportation (Medical): Not on file  . Lack of Transportation (Non-Medical): Not on file  Physical Activity:   .  Days of Exercise per Week: Not on file  . Minutes of Exercise per Session: Not on file  Stress:   . Feeling of Stress : Not on file  Social Connections:   . Frequency of Communication with Friends and Family: Not on file  . Frequency of Social Gatherings with Friends and Family: Not on file  . Attends Religious Services: Not on file  . Active Member of Clubs or Organizations: Not on file  . Attends Archivist Meetings: Not on file  . Marital Status: Not on file    Outpatient Encounter Medications as of 09/09/2019  Medication Sig  . acetaminophen (TYLENOL) 325 MG tablet Take 500 mg by mouth every 6 (six) hours as needed for moderate pain, fever or headache.   Marland Kitchen apixaban (ELIQUIS) 2.5 MG TABS tablet Take 1 tablet (2.5 mg total) by mouth 2 (two) times daily.  . Cholecalciferol (VITAMIN D3) 50 MCG (2000 UT) CHEW Chew 1 tablet by mouth daily.  . fluticasone (FLONASE) 50 MCG/ACT nasal spray SPRAY 2 SPRAYS INTO EACH NOSTRIL EVERY DAY  . furosemide (LASIX) 20 MG tablet TAKE 1 TABLET BY MOUTH EVERY DAY  . Multiple Vitamins-Minerals (OCUVITE EYE HEALTH FORMULA) CAPS Take 1 capsule by mouth daily.  Marland Kitchen omeprazole (PRILOSEC) 40 MG capsule TAKE 1 CAPSULE BY MOUTH EVERY DAY 15-20 MINUTES BEFORE MEALS  . sotalol (BETAPACE) 80 MG tablet Take 40 mg by mouth 2 (two) times daily.   Marland Kitchen SYNTHROID 50 MCG tablet TAKE 1 TABLET BY MOUTH EVERY DAY BEFORE BREAKFAST  . traMADol (ULTRAM) 50 MG tablet Take 1 tablet (50 mg total) by mouth 4 (four) times daily.  . vitamin B-12 (CYANOCOBALAMIN) 1000 MCG tablet Take by mouth.   No facility-administered encounter medications on file as of 09/09/2019.    Activities of Daily Living In your present state of health, do you have any difficulty performing the following activities: 09/09/2019  Hearing? N  Vision? Y  Comment Macular degeneration  Difficulty concentrating or making decisions? N  Walking or climbing stairs? Y  Comment Impaired vision. Walker in use.   Dressing or bathing? N  Doing errands, shopping? Y  Comment She does not Physiological scientist and eating ? Y  Comment Uses mostly frozen dinners in the microwave. Difficulty seeing recipe for cooking. Self feeds.  Using the Toilet? N  In the past six months, have you accidently leaked urine? N  Comment Managed with daily pad  Do you have problems with loss of bowel control? N  Managing your Medications? Y  Comment Notes more difficulty seeing fine print.  Managing your Finances? Darreld Mclean  Comment Notes more difficulty seeing fine print.  Housekeeping or managing your Housekeeping? Y  Comment Limited with fatigue and balance  Some recent data might be hidden    Patient Care Team: [name], MD as PCP - General (Family Medicine) Yolonda Kida, MD as Consulting Physician (Cardiology)    Assessment:   This is a routine wellness examination for Amanda Soto.  Nurse connected with patient 09/09/19 at  1:30 PM EST by a telephone enabled telemedicine application and verified that I am speaking with the correct person using two identifiers. Patient stated full name and DOB. Patient gave permission to continue with virtual visit. Patient's location was at home and Nurse's location was at [place] office.   Patient is alert and oriented x3. Patient denies difficulty focusing or concentrating. Patient likes to listen to audio books and completes her finances for brain stimulation.   Health Maintenance Due: -PNA and Tdap vaccine- discussed; to be completed with doctor in visit or local pharmacy.  See completed HM at the end of note.   Eye: Visual acuity not assessed. Virtual visit. Followed by their ophthalmologist.  Dental: Visits every 6 months.    Hearing: Demonstrates normal hearing during visit.  Safety:  Patient feels safe at home- yes Patient does have smoke detectors at home- yes Patient does wear sunscreen or protective clothing when in direct sunlight - yes Patient does  wear seat belt when in a moving vehicle - yes Patient drives- no Adequate lighting in walkways free from debris- yes Grab bars and handrails used as appropriate- yes Ambulates with an assistive device- yes; walker Cell phone on person when ambulating outside of the home- yes  Social: Alcohol intake - yes      Smoking history- never   Smokers in home? none Illicit drug use? none  Medication: Taking as directed.  Pill box in use -yes  Self managed - yes   Covid-19: Precautions and sickness symptoms discussed. Wears mask, social distancing, hand hygiene as appropriate.   Activities of Daily Living Patient currently manages: household chores, feeding themselves, getting from bed to chair, getting to the toilet, bathing/showering, dressing, managing money, or preparing meals.   Patient has decided it is getting harder to manage some ADLs due to continued lack of energy, balance and more difficulty seeing when completing bathing, dressing, preparing meals, medications and household chores. Notes she plans to apply for aide assist through the [facility]. Deferred to pcp for follow up.   Discussed the importance of a healthy diet, water intake and the benefits of aerobic exercise.   Physical activity- Limited. Encouraged to stay active as tolerated.   Diet:  Regular Water: good intake Caffeine: 1-2 cup of coffee  Other Providers Patient Care Team: [name], MD as PCP - General (Family Medicine) Yolonda Kida, MD as Consulting Physician (Cardiology)  Exercise Activities and Dietary recommendations Current Exercise Habits: The patient does not participate in regular exercise at present  Goals      Patient Stated   . Healthy lifestyle (pt-stated)     Take medication for pain before strenuous activity        Fall Risk Fall Risk  09/09/2019 07/21/2019 06/08/2019 03/19/2019 03/04/2019  Falls in the past year? 1 0 0 1 0  Comment No falls since last reported in  03/2019 - - - -  Number falls in past yr: - - 0 1 0  Comment - - - - -  Injury with Fall? - - -  1 0  Comment - - - Went to hospital because she hit head, ct scan good, MRi back- fracture tailbone -  Risk for fall due to : Impaired vision;Impaired balance/gait - - History of fall(s) -  Risk for fall due to: Comment - - - - -  Follow up Falls evaluation completed;Falls prevention discussed - Falls evaluation completed Education provided Falls evaluation completed   Timed Get Up and Go performed: no, virtual visit  Depression Screen PHQ 2/9 Scores 09/09/2019 07/21/2019 06/08/2019 03/04/2019  PHQ - 2 Score 0 0 0 0  PHQ- 9 Score - - - -  Exception Documentation - - - -     Cognitive Function     6CIT Screen 09/09/2019 09/08/2018 07/26/2017 07/25/2016  What Year? 0 points 0 points 0 points 0 points  What month? 0 points 0 points 0 points 0 points  What time? 0 points 0 points 0 points 0 points  Count back from 20 0 points 0 points 0 points 0 points  Months in reverse 0 points 0 points 0 points 0 points  Repeat phrase 0 points 0 points 0 points -  Total Score 0 0 0 -    Immunization History  Administered Date(s) Administered  . Influenza Split 04/22/2014  . Influenza, High Dose Seasonal PF 04/25/2016, 05/07/2017, 03/02/2018, 04/10/2019  . Influenza-Unspecified 05/02/2015, 04/10/2019  . PFIZER SARS-COV-2 Vaccination 08/06/2019, 08/27/2019   Screening Tests Health Maintenance  Topic Date Due  . TETANUS/TDAP  02/10/1944  . PNA vac Low Risk Adult (1 of 2 - PCV13) 02/11/1992  . INFLUENZA VACCINE  Completed  . DEXA SCAN  Completed      Plan:   Keep all routine maintenance appointments.   Follow up today with your doctor @ 2:15.  Medicare Attestation I have personally reviewed: The patient's medical and social history Their use of alcohol, tobacco or illicit drugs Their current medications and supplements The patient's functional ability including ADLs,fall risks, home safety risks,  cognitive, and hearing and visual impairment Diet and physical activities Evidence for depression   I have reviewed and discussed with patient certain preventive protocols, quality metrics, and best practice recommendations.   Varney Biles, LPN  [id number]

## 2019-09-10 ENCOUNTER — Encounter: Payer: Self-pay | Admitting: Podiatry

## 2019-09-10 ENCOUNTER — Ambulatory Visit: Payer: Medicare Other | Admitting: Podiatry

## 2019-09-10 ENCOUNTER — Other Ambulatory Visit: Payer: Self-pay

## 2019-09-10 VITALS — Temp 97.6°F

## 2019-09-10 DIAGNOSIS — B351 Tinea unguium: Secondary | ICD-10-CM | POA: Diagnosis not present

## 2019-09-10 DIAGNOSIS — D689 Coagulation defect, unspecified: Secondary | ICD-10-CM

## 2019-09-10 DIAGNOSIS — M79609 Pain in unspecified limb: Secondary | ICD-10-CM

## 2019-09-10 DIAGNOSIS — L608 Other nail disorders: Secondary | ICD-10-CM | POA: Diagnosis not present

## 2019-09-10 NOTE — Progress Notes (Signed)
This patient returns to my office for at risk foot care.  This patient requires this care by a professional since this patient will be at risk due to having  Coagulation defect.  Patient is taking eliquiss.  This patient is unable to cut nails herself since the patient cannot reach her  nails.These nails are painful walking and wearing shoes.  This patient presents for at risk foot care today. She presents to the office with her niece.  General Appearance  Alert, conversant and in no acute stress.  Vascular  Dorsalis pedis and posterior tibial  pulses are palpable  bilaterally.  Capillary return is within normal limits  bilaterally. Temperature is within normal limits  bilaterally.  Neurologic  Senn-Weinstein monofilament wire test within normal limits  bilaterally. Muscle power within normal limits bilaterally.  Nails Thick disfigured discolored nails with subungual debris  from hallux to fifth toes bilaterally. No evidence of bacterial infection or drainage bilaterally. Pincer nails.  Orthopedic  No limitations of motion  feet .  No crepitus or effusions noted.  No bony pathology or digital deformities noted.Hammer toes second.  Skin  normotropic skin with no porokeratosis noted bilaterally.  No signs of infections or ulcers noted.     Onychomycosis  Pain in right toes  Pain in left toes  Consent was obtained for treatment procedures.   Mechanical debridement of nails 1-5  bilaterally performed with a nail nipper.  Filed with dremel without incident. No infection or ulcer.     Return office visit 3 months         Told patient to return for periodic foot care and evaluation due to potential at risk complications.   Gardiner Barefoot DPM

## 2019-09-14 DIAGNOSIS — R011 Cardiac murmur, unspecified: Secondary | ICD-10-CM | POA: Diagnosis not present

## 2019-09-14 DIAGNOSIS — R531 Weakness: Secondary | ICD-10-CM | POA: Diagnosis not present

## 2019-09-14 DIAGNOSIS — I480 Paroxysmal atrial fibrillation: Secondary | ICD-10-CM | POA: Diagnosis not present

## 2019-09-14 DIAGNOSIS — I10 Essential (primary) hypertension: Secondary | ICD-10-CM | POA: Diagnosis not present

## 2019-09-15 ENCOUNTER — Telehealth: Payer: Self-pay

## 2019-09-16 ENCOUNTER — Encounter: Payer: Self-pay | Admitting: Pain Medicine

## 2019-09-16 NOTE — Telephone Encounter (Signed)
I will complete this when I am back in the office.

## 2019-09-19 NOTE — Progress Notes (Signed)
Patient: Amanda Soto  Service Category: E/M  Provider: Gaspar Cola, MD  DOB: October 01, 1924  DOS: 09/21/2019  Location: Office  MRN: [id number]  Setting: Ambulatory outpatient  Referring Provider: [name], MD  Type: Established Patient  Specialty: Interventional Pain Management  PCP: [name], MD  Location: Remote location  Delivery: TeleHealth     Virtual Encounter - Pain Management PROVIDER NOTE: Information contained herein reflects review and annotations entered in association with encounter. Interpretation of such information and data should be left to medically-trained personnel. Information provided to patient can be located elsewhere in the medical record under "Patient Instructions". Document created using STT-dictation technology, any transcriptional errors that may result from process are unintentional.    Contact & Pharmacy Preferred: [place]: [phone] (home) Mobile: [phone] (mobile) E-mail: [email or web address]  [id number] [place] [id number]: [phone]: [phone]  Pre-screening  [name] offered "in-person" vs "virtual" encounter. She indicated preferring virtual for this encounter.   Reason COVID-19*  Social distancing based on CDC and AMA recommendations.   I contacted PBETTYJANE SHENOYon 09/21/2019 via telephone.      I clearly identified myself as FGaspar Cola MD. I verified that I was speaking with the correct person using two identifiers (Name: Amanda Soto and date of birth: [birth year].  Consent I sought verbal advanced consent from PKatheren Pullerfor virtual visit interactions. I informed Amanda Soto of possible security and privacy concerns, risks, and limitations associated with providing "not-in-person" medical evaluation and management services. I also informed Ms. Ropp of the availability of "in-person" appointments. Finally, I informed  her that there would be a charge for the virtual visit and that she could be  personally, fully or partially, financially responsible for it. [name] understanding and agreed to proceed.   Historic Elements   Ms. Amanda NANTZis a 86y.o. year old, female patient evaluated today after her last contact with our practice on 07/22/2019. [name] has a past medical history of Acute postoperative pain (12/17/2016), Anemia (11/10/2015), Arthritis, Atrial fibrillation ([place], CHF (congestive heart failure) ([place], Chickenpox, Chronic abdominal pain (01/16/2014), Closed Colles' fracture (12/17/2016), Degenerative arthritis of hip (08/19/2014), Degenerative arthritis of lumbar spine (11/18/2013), Depression, Diverticulitis, GERD (gastroesophageal reflux disease), Heart murmur, Heart rate slow, Hyperlipidemia, Hypertension, Hypothyroid, Neuritis or radiculitis due to rupture of lumbar intervertebral disc (11/18/2013), Skin cancer, Symptomatic anemia (11/09/2015), and Trochanteric bursitis of right hip (11/18/2013). She also  has a past surgical history that includes Cataract extraction; Partial hysterectomy; Appendectomy; and Tonsillectomy. [name] a current medication list which includes the following prescription(s): acetaminophen, amoxicillin-clavulanate, apixaban, vitamin d3, fluticasone, furosemide, multiple vitamins-minerals, omeprazole, sotalol, synthroid, [START ON 10/01/2019] tramadol, triamcinolone cream, and vitamin b-12. She  reports that she has never smoked. She has never used smokeless tobacco. She reports current alcohol use of about 1.0 standard drinks of alcohol per week. She reports that she does not use drugs. Ms. HClairis allergic to hydrocodone; ciprofloxacin; and latex.   HPI  Today, she is being contacted for medication management. The patient indicates doing well with the current medication regimen. No adverse reactions or side effects reported to the medications.  However, the patient  indicates that occasionally the medicine will not help with her pain.  She refers that she still having some right sided low back pain that feels like it goes down into the area  of the hip.  She does admit getting some benefit from the prior treatment, but she feels that there is something else that remains.  Today she asked me if I thought that she had cancer in her spine in reviewing her last MRI, there is no mention as to any type of lesions that would be suspicious for cancer.  This was informed to the patient and she was given some reassurance that not all pain is associated with cancer and to the best of our knowledge, hers is more associated with degenerative disc disease and degenerative joint disease of the lumbar spine.  Today I have also offered the patient to see her face-to-face to conduct a physical exam to see if I can identify the possible cause of this pain that remains in the area.  She indicated that she has a couple of other visits to attend to but that she would give [place] a call as soon as she feels that she is able to come in for the exam.  Pharmacotherapy Assessment  Analgesic: Tramadol 50 mg, 1 tablet PO q 6 hrs (200 mg/day of tramadol) MME/day: 20 mg/day.   Monitoring: Amanda Soto PMP: PDMP reviewed during this encounter.       Pharmacotherapy: No side-effects or adverse reactions reported. Compliance: No problems identified. Effectiveness: Clinically acceptable. Plan: Refer to "POC".  UDS: No results found for: SUMMARY Laboratory Chemistry Profile   Renal Lab Results  Component Value Date   BUN 18 08/12/2019   CREATININE 0.58 08/12/2019   BCR 28 03/30/2019   GFR 97.12 08/12/2019   GFRAA 99 03/30/2019   GFRNONAA 86 03/30/2019    Hepatic Lab Results  Component Value Date   AST 20 06/16/2019   ALT 15 06/16/2019   ALBUMIN 4.1 06/16/2019   ALKPHOS 72 06/16/2019   LIPASE 181 11/02/2013    Electrolytes Lab Results  Component Value Date   NA 132 (L) 08/12/2019   K 4.2  08/12/2019   CL 96 08/12/2019   CALCIUM 9.7 08/12/2019   MG 2.0 03/30/2019    Bone Lab Results  Component Value Date   VD25OH 25.80 (L) 06/16/2019   25OHVITD1 14 (L) 03/30/2019   25OHVITD2 <1.0 03/30/2019   25OHVITD3 14 03/30/2019    Inflammation (CRP: Acute Phase) (ESR: Chronic Phase) Lab Results  Component Value Date   CRP <1 03/30/2019   ESRSEDRATE 50 (H) 03/30/2019      Note: Above Lab results reviewed.  Imaging  DG PAIN CLINIC C-ARM 1-60 MIN NO REPORT Fluoro was used, but no Radiologist interpretation will be provided.  Please refer to "NOTES" tab for provider progress note.  Assessment  The primary encounter diagnosis was Chronic pain syndrome. Diagnoses of Chronic low back pain (Primary Area of Pain) (Right) and Grade 1 Anterolisthesis of L4 over L5 (5 mm) were also pertinent to this visit.  Plan of Care  Problem-specific:  No problem-specific Assessment & Plan notes found for this encounter.  Ms. MYLINDA BROOK has a current medication list which includes the following long-term medication(s): apixaban, fluticasone, furosemide, sotalol, synthroid, and [START ON 10/01/2019] tramadol.  Pharmacotherapy (Medications Ordered): Meds ordered this encounter  Medications  . traMADol (ULTRAM) 50 MG tablet    Sig: Take 1 tablet (50 mg total) by mouth 4 (four) times daily.    Dispense:  120 tablet    Refill:  5    Chronic Pain: STOP Act (Not applicable) Fill 1 day early if closed on refill date. Do not fill until:  10/01/2019. To last until: 03/29/2020. Avoid benzodiazepines within 8 hours of opioids   Orders:  No orders of the defined types were placed in this encounter.  Follow-up plan:   Return in about 6 months (around 03/29/2020) for (VV), (MM).      Considering: NOTE:Stop Eliquisfor 3 daysprior to aprocedure.    Palliative PRN treatment(s): Palliative/therapeutic caudal ESI #2 (100/100/90/90) Palliative/Therapeuticright lumbar facet RFA #3(last done  03/27/2018) (100/100/100/90) (100/90/0) Palliative/therapeutic right-sidedSIjoint RFA#1 (last done 03/27/2018) (100/90/0) Palliative right lumbar facet block#4(100/100/90/75) Palliative right sacroiliac joint block#4     Recent Visits Date Type Provider Dept  08/05/19 Telemedicine Milinda Pointer, MD [facility]  07/21/19 Procedure visit Milinda Pointer, MD [facility]  07/08/19 Telemedicine Milinda Pointer, MD [facility]  Showing recent visits within past 90 days and meeting all other requirements   Today's Visits Date Type Provider Dept  09/21/19 Telemedicine Milinda Pointer, MD [facility]  Showing today's visits and meeting all other requirements   Future Appointments No visits were found meeting these conditions.  Showing future appointments within next 90 days and meeting all other requirements   I discussed the assessment and treatment plan with the patient. The patient was provided an opportunity to ask questions and all were answered. The patient agreed with the plan and demonstrated an understanding of the instructions.  Patient advised to call back or seek an in-person evaluation if the symptoms or condition worsens.  Duration of encounter: 13 minutes.  Note by: Gaspar Cola, MD Date: 09/21/2019; Time: 10:27 AM

## 2019-09-21 ENCOUNTER — Ambulatory Visit: Payer: Medicare Other | Attending: Pain Medicine | Admitting: Pain Medicine

## 2019-09-21 ENCOUNTER — Other Ambulatory Visit: Payer: Self-pay

## 2019-09-21 DIAGNOSIS — G894 Chronic pain syndrome: Secondary | ICD-10-CM | POA: Diagnosis not present

## 2019-09-21 DIAGNOSIS — G8929 Other chronic pain: Secondary | ICD-10-CM | POA: Diagnosis not present

## 2019-09-21 DIAGNOSIS — M4310 Spondylolisthesis, site unspecified: Secondary | ICD-10-CM

## 2019-09-21 DIAGNOSIS — M545 Low back pain, unspecified: Secondary | ICD-10-CM

## 2019-09-21 MED ORDER — TRAMADOL HCL 50 MG PO TABS: 50.0000 mg | ORAL_TABLET | Freq: Four times a day (QID) | ORAL | 5 refills | Status: DC

## 2019-09-23 ENCOUNTER — Telehealth: Payer: Self-pay | Admitting: Pain Medicine

## 2019-09-23 ENCOUNTER — Other Ambulatory Visit: Payer: Self-pay | Admitting: Pain Medicine

## 2019-09-23 ENCOUNTER — Other Ambulatory Visit: Payer: Medicare Other

## 2019-09-23 DIAGNOSIS — G894 Chronic pain syndrome: Secondary | ICD-10-CM

## 2019-09-23 NOTE — Telephone Encounter (Signed)
Form has been reviewed. She needs to have an in office visit for an exam to complete this form. Please get her scheduled. Thanks.

## 2019-09-23 NOTE — Telephone Encounter (Signed)
Patient called stating she called pharmacy to pick up her March script of Tramadol and they dont have a script to fill until 10-01-19. She states she did not pick up a script for March. Please check into this and let patient know what to do.  Thank you

## 2019-09-23 NOTE — Telephone Encounter (Signed)
Look at my 03/23/2019 note.  Specifically look at the prescription for the tramadol.  He has 5 refills and it should last until 10/01/2019.  If you follow-up with the PMP you will notice she has had the medication filled only 3 times.  There were not different prescriptions as the same with refills.  If the pharmacy filled the initial 3, they should still have the same prescription because all of the refills are coming from that one prescription written on 03/23/2019.

## 2019-09-23 NOTE — Telephone Encounter (Signed)
Dr. Dossie Arbour,                   The last time she filled a script from you was on 02/06. Can you explain why she has a fill date of 04/01? The script was originated on 03/22. Patient is wanting to know. Thanks- Anderson Malta

## 2019-09-23 NOTE — Telephone Encounter (Signed)
I called and scheduled the patient for a appt. For the [place] form she sent in.  Amanda Soto.  Lesia Hausen

## 2019-09-23 NOTE — Telephone Encounter (Signed)
Pt called to check on paperwork and asked why she would need to make an appt she just had one on 09/09/19

## 2019-09-24 NOTE — Telephone Encounter (Signed)
Dr. Dossie Arbour- She will need a new tramadol prescription dated to fill today. Thank you- Anderson Malta

## 2019-09-24 NOTE — Telephone Encounter (Signed)
The TRAMADOL prescription written back in SEPT 2020 expired 09/20/19- they expire after 6 months. Please advise.

## 2019-09-25 ENCOUNTER — Telehealth: Payer: Self-pay | Admitting: *Deleted

## 2019-09-28 ENCOUNTER — Other Ambulatory Visit: Payer: Self-pay

## 2019-09-30 ENCOUNTER — Ambulatory Visit (INDEPENDENT_AMBULATORY_CARE_PROVIDER_SITE_OTHER): Payer: Medicare Other | Admitting: Family Medicine

## 2019-09-30 ENCOUNTER — Encounter: Payer: Self-pay | Admitting: Family Medicine

## 2019-09-30 ENCOUNTER — Other Ambulatory Visit: Payer: Self-pay

## 2019-09-30 DIAGNOSIS — G8929 Other chronic pain: Secondary | ICD-10-CM | POA: Diagnosis not present

## 2019-09-30 DIAGNOSIS — M545 Low back pain, unspecified: Secondary | ICD-10-CM

## 2019-09-30 DIAGNOSIS — H547 Unspecified visual loss: Secondary | ICD-10-CM | POA: Diagnosis not present

## 2019-09-30 NOTE — Assessment & Plan Note (Signed)
Chronic issue limiting her mobility and balance.  Forms filled out.  They will be scanned into the chart.  She will continue her current pain regimen through her pain specialist.

## 2019-09-30 NOTE — Progress Notes (Signed)
  [name], MD Phone: [phone]  Amanda Soto is a 86 y.o. female who presents today for f/u.  Patient presents to have the [place] form filled out.  She has chronic back pain that limits her mobility and balance.  She walks with a walker.  She has difficulty preparing meals and typically eats precooked frozen meals.  She notes she needs help getting in and out of the shower.  She follows with an ophthalmologist and notes they advised her that she was legally blind.  She has difficulty cutting her medications in half.  She is not even able to walk 1 block with her walker.  The only time she leaves the house is to go to the physician's offices.  Social History   Tobacco Use  Smoking Status Never Smoker  Smokeless Tobacco Never Used     ROS see history of present illness  Objective  Physical Exam Vitals:   09/30/19 1328  BP: 120/80  Pulse: (!) 59  Temp: (!) 96.4 F (35.8 C)  SpO2: 97%    BP Readings from Last 3 Encounters:  09/30/19 120/80  07/21/19 (!) 143/49  03/05/19 (!) 139/45   Wt Readings from Last 3 Encounters:  09/30/19 123 lb (55.8 kg)  09/09/19 113 lb (51.3 kg)  09/09/19 115 lb (52.2 kg)    Physical Exam Constitutional:      General: She is not in acute distress.    Appearance: She is not diaphoretic.  Pulmonary:     Effort: Pulmonary effort is normal.  Neurological:     Mental Status: She is alert.     Comments: 5/5 strength in bilateral biceps, triceps, grip, quads, hamstrings, plantar and dorsiflexion, sensation to light touch intact in bilateral UE and LE, slow shuffled gait with walker and use, very hesitant when trying to take a step without walker, left eye vision 20/200, right eye vision 20/200      Assessment/Plan: Please see individual problem list.  Poor vision Vision is quite poor.  We do not have the ability to test beyond 20/200.  She will continue to see her ophthalmologist.  Chronic low back pain (Primary Area of Pain)  (Right) Chronic issue limiting her mobility and balance.  Forms filled out.  They will be scanned into the chart.  She will continue her current pain regimen through her pain specialist.   No orders of the defined types were placed in this encounter.   No orders of the defined types were placed in this encounter.   This visit occurred during the SARS-CoV-2 public health emergency.  Safety protocols were in place, including screening questions prior to the visit, additional usage of staff PPE, and extensive cleaning of exam room while observing appropriate contact time as indicated for disinfecting solutions.    I have spent 33 minutes in the care of this patient regarding history taking, physical examination, documentation, and completion of paperwork. Paperwork will be scanned in to the chart.    [name], MD [place]

## 2019-09-30 NOTE — Assessment & Plan Note (Signed)
Vision is quite poor.  We do not have the ability to test beyond 20/200.  She will continue to see her ophthalmologist.

## 2019-09-30 NOTE — Patient Instructions (Signed)
I have completed your form.  Please let us know if they need any additional information.

## 2019-10-05 ENCOUNTER — Other Ambulatory Visit: Payer: Self-pay | Admitting: Pain Medicine

## 2019-10-05 ENCOUNTER — Ambulatory Visit
Admission: RE | Admit: 2019-10-05 | Discharge: 2019-10-05 | Disposition: A | Discharge status: Home / Self Care | Payer: Medicare Other | Source: Ambulatory Visit | Attending: Family Medicine | Admitting: Family Medicine

## 2019-10-05 DIAGNOSIS — M818 Other osteoporosis without current pathological fracture: Secondary | ICD-10-CM | POA: Diagnosis not present

## 2019-10-05 DIAGNOSIS — M810 Age-related osteoporosis without current pathological fracture: Secondary | ICD-10-CM | POA: Insufficient documentation

## 2019-10-07 ENCOUNTER — Telehealth: Payer: Self-pay | Admitting: *Deleted

## 2019-10-07 DIAGNOSIS — M810 Age-related osteoporosis without current pathological fracture: Secondary | ICD-10-CM

## 2019-10-07 NOTE — Telephone Encounter (Signed)
Ordered

## 2019-10-07 NOTE — Telephone Encounter (Signed)
Pt was added to the lab schedule with no lab orders. Please place future lab orders for appt on 10/08/19.

## 2019-10-08 ENCOUNTER — Other Ambulatory Visit (INDEPENDENT_AMBULATORY_CARE_PROVIDER_SITE_OTHER): Payer: Medicare Other

## 2019-10-08 ENCOUNTER — Other Ambulatory Visit: Payer: Self-pay

## 2019-10-08 DIAGNOSIS — M810 Age-related osteoporosis without current pathological fracture: Secondary | ICD-10-CM | POA: Diagnosis not present

## 2019-10-08 LAB — COMPREHENSIVE METABOLIC PANEL
ALT: 11 U/L (ref 0–35)
AST: 19 U/L (ref 0–37)
Albumin: 4.1 g/dL (ref 3.5–5.2)
Alkaline Phosphatase: 54 U/L (ref 39–117)
BUN: 15 mg/dL (ref 6–23)
CO2: 30 mEq/L (ref 19–32)
Calcium: 9.3 mg/dL (ref 8.4–10.5)
Chloride: 101 mEq/L (ref 96–112)
Creatinine, Ser: 0.49 mg/dL (ref 0.40–1.20)
GFR: 117.95 mL/min (ref >60.00)
Glucose, Bld: 93 mg/dL (ref 70–99)
Potassium: 3.8 mEq/L (ref 3.5–5.1)
Sodium: 136 mEq/L (ref 135–145)
Total Bilirubin: 0.7 mg/dL (ref 0.2–1.2)
Total Protein: 7.1 g/dL (ref 6.0–8.3)

## 2019-10-08 LAB — VITAMIN D 25 HYDROXY (VIT D DEFICIENCY, FRACTURES): VITD: 29.45 ng/mL — ABNORMAL LOW (ref 30.00–100.00)

## 2019-10-12 LAB — PTH, INTACT AND CALCIUM: PTH: 40 pg/mL (ref 14–64)

## 2019-11-02 ENCOUNTER — Telehealth: Payer: Self-pay | Admitting: *Deleted

## 2019-11-02 NOTE — Telephone Encounter (Signed)
-----   Message from [name], MD sent at 10/29/2019  1:25 PM EDT ----- Is evenity given in clinic? If so, could we see if we can get this patient approved for it?

## 2019-11-02 NOTE — Telephone Encounter (Signed)
Eventity given in office, I have filed for approval on [employer] Portal patient ID# [id number]. & to 10 for decision.

## 2019-11-10 NOTE — Telephone Encounter (Signed)
Received [employer] approval now awaiting prior approval from [employer]. [place] [employer] Key: [id number] - [place] Case ID: [id number]

## 2019-11-10 NOTE — Telephone Encounter (Signed)
Second PA.[place] [employer] Key: [id number] - [place] Case ID: [id number]

## 2019-11-13 ENCOUNTER — Telehealth: Payer: Self-pay | Admitting: Family Medicine

## 2019-11-13 NOTE — Telephone Encounter (Addendum)
Approvedon May 11 Request Reference Number: [id number]. EVENITY INJ 105MG  is approved through 07/01/2020. Your patient may now fill this prescription and it will be covered. Ok to schedule first injection.

## 2019-11-13 NOTE — Telephone Encounter (Signed)
Patient keeps getting a phone call about an injection that has been approved about her provider. She would like to know what injections has been approved.

## 2019-11-13 NOTE — Telephone Encounter (Signed)
It is ok to schedule her first injection. Do I need to send it to her pharmacy or do we order it for her?

## 2019-11-13 NOTE — Telephone Encounter (Signed)
Patient was informed.

## 2019-11-13 NOTE — Telephone Encounter (Signed)
This is evenity for her osteoporosis. It will be administered in the office so she does not have to give her self an injection.

## 2019-11-13 NOTE — Telephone Encounter (Signed)
Noted  

## 2019-11-13 NOTE — Telephone Encounter (Signed)
I order the medication just like Prolia.

## 2019-12-14 ENCOUNTER — Encounter: Payer: Self-pay | Admitting: Podiatry

## 2019-12-14 ENCOUNTER — Other Ambulatory Visit: Payer: Self-pay

## 2019-12-14 ENCOUNTER — Ambulatory Visit: Payer: Medicare Other | Admitting: Podiatry

## 2019-12-14 DIAGNOSIS — B351 Tinea unguium: Secondary | ICD-10-CM

## 2019-12-14 DIAGNOSIS — M79609 Pain in unspecified limb: Secondary | ICD-10-CM | POA: Diagnosis not present

## 2019-12-14 DIAGNOSIS — L608 Other nail disorders: Secondary | ICD-10-CM | POA: Diagnosis not present

## 2019-12-14 DIAGNOSIS — D689 Coagulation defect, unspecified: Secondary | ICD-10-CM | POA: Diagnosis not present

## 2019-12-14 NOTE — Progress Notes (Signed)
This patient returns to my office for at risk foot care.  This patient requires this care by a professional since this patient will be at risk due to having  Coagulation defect.  Patient is taking eliquiss.  This patient is unable to cut nails herself since the patient cannot reach her  nails.These nails are painful walking and wearing shoes.  This patient presents for at risk foot care today. She presents to the office with her niece.  General Appearance  Alert, conversant and in no acute stress.  Vascular  Dorsalis pedis and posterior tibial  pulses are palpable  bilaterally.  Capillary return is within normal limits  bilaterally. Temperature is within normal limits  bilaterally.  Neurologic  Senn-Weinstein monofilament wire test within normal limits  bilaterally. Muscle power within normal limits bilaterally.  Nails Thick disfigured discolored nails with subungual debris  from hallux to fifth toes bilaterally. No evidence of bacterial infection or drainage bilaterally. Pincer nails.  Orthopedic  No limitations of motion  feet .  No crepitus or effusions noted.  No bony pathology or digital deformities noted.Hammer toes second.  Skin  normotropic skin with no porokeratosis noted bilaterally.  No signs of infections or ulcers noted.     Onychomycosis  Pain in right toes  Pain in left toes  Consent was obtained for treatment procedures.   Mechanical debridement of nails 1-5  bilaterally performed with a nail nipper.  Filed with dremel without incident. No infection or ulcer.     Return office visit 3 months         Told patient to return for periodic foot care and evaluation due to potential at risk complications.   Telena Peyser DPM  

## 2019-12-15 ENCOUNTER — Telehealth: Payer: Self-pay | Admitting: Family Medicine

## 2019-12-15 ENCOUNTER — Other Ambulatory Visit: Payer: Self-pay

## 2019-12-15 NOTE — Telephone Encounter (Signed)
Pt called

## 2019-12-18 ENCOUNTER — Other Ambulatory Visit
Admission: RE | Admit: 2019-12-18 | Discharge: 2019-12-18 | Disposition: A | Discharge status: Home / Self Care | Payer: Medicare Other | Source: Ambulatory Visit | Attending: Family Medicine | Admitting: Family Medicine

## 2019-12-18 ENCOUNTER — Encounter: Payer: Self-pay | Admitting: Family Medicine

## 2019-12-18 ENCOUNTER — Ambulatory Visit (INDEPENDENT_AMBULATORY_CARE_PROVIDER_SITE_OTHER): Payer: Medicare Other | Admitting: Family Medicine

## 2019-12-18 ENCOUNTER — Other Ambulatory Visit: Payer: Self-pay

## 2019-12-18 VITALS — BP 120/70 | HR 105 | Temp 96.4°F | Ht 59.0 in | Wt 118.0 lb

## 2019-12-18 DIAGNOSIS — R61 Generalized hyperhidrosis: Secondary | ICD-10-CM

## 2019-12-18 DIAGNOSIS — S81801A Unspecified open wound, right lower leg, initial encounter: Secondary | ICD-10-CM | POA: Diagnosis not present

## 2019-12-18 DIAGNOSIS — I4891 Unspecified atrial fibrillation: Secondary | ICD-10-CM

## 2019-12-18 LAB — CBC WITH DIFFERENTIAL/PLATELET
Abs Immature Granulocytes: 0.02 10*3/uL (ref 0.00–0.07)
Basophils Absolute: 0.1 10*3/uL (ref 0.0–0.1)
Basophils Relative: 1 %
Eosinophils Absolute: 0.1 10*3/uL (ref 0.0–0.5)
Eosinophils Relative: 2 %
HCT: 46.2 % — ABNORMAL HIGH (ref 36.0–46.0)
Hemoglobin: 15.9 g/dL — ABNORMAL HIGH (ref 12.0–15.0)
Immature Granulocytes: 0 %
Lymphocytes Relative: 32 %
Lymphs Abs: 3 10*3/uL (ref 0.7–4.0)
MCH: 31.9 pg (ref 26.0–34.0)
MCHC: 34.4 g/dL (ref 30.0–36.0)
MCV: 92.8 fL (ref 80.0–100.0)
Monocytes Absolute: 0.6 10*3/uL (ref 0.1–1.0)
Monocytes Relative: 6 %
Neutro Abs: 5.4 10*3/uL (ref 1.7–7.7)
Neutrophils Relative %: 59 %
Platelets: 252 10*3/uL (ref 150–400)
RBC: 4.98 MIL/uL (ref 3.87–5.11)
RDW: 13.2 % (ref 11.5–15.5)
WBC: 9.2 10*3/uL (ref 4.0–10.5)
nRBC: 0 % (ref 0.0–0.2)

## 2019-12-18 LAB — URINALYSIS, ROUTINE W REFLEX MICROSCOPIC
Bacteria, UA: NONE SEEN
Bilirubin Urine: NEGATIVE
Glucose, UA: NEGATIVE mg/dL
Hgb urine dipstick: NEGATIVE
Ketones, ur: NEGATIVE mg/dL
Nitrite: NEGATIVE
Protein, ur: 30 mg/dL — AB
Specific Gravity, Urine: 1.02 (ref 1.005–1.030)
Squamous Epithelial / HPF: NONE SEEN (ref 0–5)
pH: 5 (ref 5.0–8.0)

## 2019-12-18 LAB — COMPREHENSIVE METABOLIC PANEL
ALT: 15 U/L (ref 0–44)
AST: 22 U/L (ref 15–41)
Albumin: 4.2 g/dL (ref 3.5–5.0)
Alkaline Phosphatase: 56 U/L (ref 38–126)
Anion gap: 10 (ref 5–15)
BUN: 17 mg/dL (ref 8–23)
CO2: 29 mmol/L (ref 22–32)
Calcium: 9.2 mg/dL (ref 8.9–10.3)
Chloride: 97 mmol/L — ABNORMAL LOW (ref 98–111)
Creatinine, Ser: 0.42 mg/dL — ABNORMAL LOW (ref 0.44–1.00)
GFR calc Af Amer: >60 mL/min (ref >60)
GFR calc non Af Amer: >60 mL/min (ref >60)
Glucose, Bld: 114 mg/dL — ABNORMAL HIGH (ref 70–99)
Potassium: 3.9 mmol/L (ref 3.5–5.1)
Sodium: 136 mmol/L (ref 135–145)
Total Bilirubin: 1.2 mg/dL (ref 0.3–1.2)
Total Protein: 7.6 g/dL (ref 6.5–8.1)

## 2019-12-18 LAB — HIV ANTIBODY (ROUTINE TESTING W REFLEX): HIV Screen 4th Generation wRfx: NONREACTIVE

## 2019-12-18 LAB — TSH: TSH: 4.601 u[IU]/mL — ABNORMAL HIGH (ref 0.350–4.500)

## 2019-12-18 NOTE — Patient Instructions (Signed)
Nice to see you.  We will have you go to the lab at the hospital for labs.  Please contact your cardiologist and let them know your heart rate has been mildly elevated recently.

## 2019-12-18 NOTE — Assessment & Plan Note (Signed)
Recurring scab in this area.  Refer to dermatology to rule out any underlying skin issue contributing to this.

## 2019-12-18 NOTE — Progress Notes (Signed)
[name], MD Phone: [phone]  Amanda Soto is a 86 y.o. female who presents today for same day visit.   Night sweats: Patient notes these been going on for a couple of weeks.  They are drenching and she has to change clothes.  No fevers, cough, congestion, chest pain, dysuria, vomiting, abdominal pain, or blood in her stool.  She had some loose stool this morning though no significant diarrhea.  She has had a Covid vaccine.  She does have some sweating during the daytime.  She keeps her house at 70 degrees at night and keeps a thin sheet on her as a bedspread.  Nonhealing wound: Patient reports her cat jumped up on her leg a month or so ago and did not scratch her though there was a nodule that came up underneath her skin.  Since then there has been a scab that comes and goes.  Scab has improved slightly.  She has been using Vaseline.  No purulent drainage or spreading redness.    A. fib: Heart rate has been a little elevated recently.  A couple days ago her heart rate was 131 and when she rechecked it was 112 after taking her sotalol.  She notes no palpitations.  She is taking her Eliquis.  She has not discussed with her cardiologist yet.  Social History   Tobacco Use  Smoking Status Never Smoker  Smokeless Tobacco Never Used     ROS see history of present illness  Objective  Physical Exam Vitals:   12/18/19 1041 12/18/19 1105  BP: 120/70   Pulse: (!) 114 (!) 105  Temp: (!) 96.4 F (35.8 C)   SpO2: 99%     BP Readings from Last 3 Encounters:  12/18/19 120/70  09/30/19 120/80  07/21/19 (!) 143/49   Wt Readings from Last 3 Encounters:  12/18/19 118 lb (53.5 kg)  09/30/19 123 lb (55.8 kg)  09/09/19 113 lb (51.3 kg)    Physical Exam Constitutional:      General: She is not in acute distress.    Appearance: She is not diaphoretic.  Cardiovascular:     Rate and Rhythm: Tachycardia present. Rhythm irregularly irregular.     Heart sounds: Normal heart sounds.    Pulmonary:     Effort: Pulmonary effort is normal.     Breath sounds: Normal breath sounds.  Abdominal:     General: Bowel sounds are normal. There is no distension.     Palpations: Abdomen is soft.     Tenderness: There is no abdominal tenderness. There is no guarding or rebound.  Musculoskeletal:     Right lower leg: No edema.     Left lower leg: No edema.       Legs:  Skin:    General: Skin is warm and dry.  Neurological:     Mental Status: She is alert.      Assessment/Plan: Please see individual problem list.  Night sweats These are concerning given that they have been drenching sweats.  Discussed potential causes with the patient and work-up with labs.  These have been ordered and she will go to the lab at the hospital for these.  She will remain home and try to avoid people until we get these results back.  Atrial fibrillation ([place]) Heart rate mildly elevated though improved some on recheck.  This could be driven by what ever is causing her night sweats.  We will check labs related to that.  She will contact her cardiologist  to inform them that her heart rate has been mildly elevated.  Wound of right leg Recurring scab in this area.  Refer to dermatology to rule out any underlying skin issue contributing to this.   Orders Placed This Encounter  Procedures  . Culture, blood (routine x 2)    Standing Status:   Future    Standing Expiration Date:   12/17/2020  . QuantiFERON-TB Gold Plus    Standing Status:   Future    Standing Expiration Date:   12/17/2020  . CBC w/Diff    Standing Status:   Future    Standing Expiration Date:   12/17/2020  . TSH    Standing Status:   Future    Standing Expiration Date:   12/17/2020  . HIV antibody (with reflex)    Standing Status:   Future    Standing Expiration Date:   12/17/2020  . Comp Met (CMET)    Standing Status:   Future    Standing Expiration Date:   12/17/2020  . Urinalysis, Routine w reflex microscopic    Standing  Status:   Future    Standing Expiration Date:   12/17/2020  . Ambulatory referral to Dermatology    Referral Priority:   Routine    Referral Type:   Consultation    Referral Reason:   Specialty Services Required    Requested Specialty:   Dermatology    Number of Visits Requested:   1    No orders of the defined types were placed in this encounter.   This visit occurred during the SARS-CoV-2 public health emergency.  Safety protocols were in place, including screening questions prior to the visit, additional usage of staff PPE, and extensive cleaning of exam room while observing appropriate contact time as indicated for disinfecting solutions.    I have spent 34 minutes in the care of this patient regarding history taking, documentation, placing orders, counseling on possible causes for her symptoms.   [name], MD [place]

## 2019-12-18 NOTE — Assessment & Plan Note (Signed)
Heart rate mildly elevated though improved some on recheck.  This could be driven by what ever is causing her night sweats.  We will check labs related to that.  She will contact her cardiologist to inform them that her heart rate has been mildly elevated.

## 2019-12-18 NOTE — Assessment & Plan Note (Signed)
These are concerning given that they have been drenching sweats.  Discussed potential causes with the patient and work-up with labs.  These have been ordered and she will go to the lab at the hospital for these.  She will remain home and try to avoid people until we get these results back.

## 2019-12-21 ENCOUNTER — Telehealth: Payer: Self-pay | Admitting: Family Medicine

## 2019-12-21 DIAGNOSIS — D582 Other hemoglobinopathies: Secondary | ICD-10-CM

## 2019-12-21 DIAGNOSIS — R809 Proteinuria, unspecified: Secondary | ICD-10-CM

## 2019-12-21 NOTE — Telephone Encounter (Signed)
I called and informed the patient of the lab results that has cam back and she would rather wait on all tests before scheduling any other labs.  Juleon Narang,cma

## 2019-12-21 NOTE — Telephone Encounter (Signed)
Noted  

## 2019-12-21 NOTE — Telephone Encounter (Signed)
Pt called about lab results. Pt states that she is very worried about them. Please call back

## 2019-12-21 NOTE — Telephone Encounter (Signed)
Pt called about lab results. Pt states that she is very worried about them. Can you look at her results.  Amanda Soto

## 2019-12-21 NOTE — Telephone Encounter (Signed)
I was waiting on all of them to come back.  Her blood cultures are negative so far though they do still have a couple of days left.  Her hemoglobin is slightly elevated and that will need to be rechecked in a week or 2.  Order placed.  You are still awaiting the tuberculosis screening test as it has not returned.  I suspect he will be back in the next day or 2.  Her TSH is minimally elevated and I would suggest that we recheck that prior to altering any medication. Her urine did have some protein in it and we will need to recheck that as well.

## 2019-12-22 LAB — QUANTIFERON-TB GOLD PLUS (RQFGPL)
QuantiFERON Mitogen Value: >10 IU/mL
QuantiFERON Nil Value: 0.04 IU/mL
QuantiFERON TB1 Ag Value: 0.05 IU/mL
QuantiFERON TB2 Ag Value: 0.05 IU/mL

## 2019-12-22 LAB — QUANTIFERON-TB GOLD PLUS: QuantiFERON-TB Gold Plus: NEGATIVE

## 2019-12-23 LAB — CULTURE, BLOOD (ROUTINE X 2)
Culture: NO GROWTH
Culture: NO GROWTH
Special Requests: ADEQUATE
Special Requests: ADEQUATE

## 2019-12-24 ENCOUNTER — Telehealth: Payer: Self-pay | Admitting: Family Medicine

## 2019-12-24 NOTE — Telephone Encounter (Signed)
Patient stated she is awaiting call back from cardiologist..

## 2019-12-24 NOTE — Telephone Encounter (Signed)
The patients son was in the office for a visit today and noted the patients HR was elevated to 146 today. Can you call her and triage her? Please see if she has contacted her cardiologist about this as well.

## 2020-01-11 ENCOUNTER — Encounter: Payer: Self-pay | Admitting: Family Medicine

## 2020-01-11 ENCOUNTER — Other Ambulatory Visit: Payer: Self-pay

## 2020-01-11 ENCOUNTER — Ambulatory Visit (INDEPENDENT_AMBULATORY_CARE_PROVIDER_SITE_OTHER): Payer: Medicare Other | Admitting: Family Medicine

## 2020-01-11 VITALS — BP 115/80 | HR 70 | Temp 97.3°F | Ht 59.0 in | Wt 118.2 lb

## 2020-01-11 DIAGNOSIS — D582 Other hemoglobinopathies: Secondary | ICD-10-CM | POA: Diagnosis not present

## 2020-01-11 DIAGNOSIS — R809 Proteinuria, unspecified: Secondary | ICD-10-CM

## 2020-01-11 DIAGNOSIS — E039 Hypothyroidism, unspecified: Secondary | ICD-10-CM

## 2020-01-11 DIAGNOSIS — S81801D Unspecified open wound, right lower leg, subsequent encounter: Secondary | ICD-10-CM

## 2020-01-11 DIAGNOSIS — R61 Generalized hyperhidrosis: Secondary | ICD-10-CM

## 2020-01-11 DIAGNOSIS — E559 Vitamin D deficiency, unspecified: Secondary | ICD-10-CM

## 2020-01-11 DIAGNOSIS — M810 Age-related osteoporosis without current pathological fracture: Secondary | ICD-10-CM

## 2020-01-11 DIAGNOSIS — I4891 Unspecified atrial fibrillation: Secondary | ICD-10-CM

## 2020-01-11 LAB — BASIC METABOLIC PANEL
BUN: 16 mg/dL (ref 6–23)
CO2: 29 mEq/L (ref 19–32)
Calcium: 9.5 mg/dL (ref 8.4–10.5)
Chloride: 96 mEq/L (ref 96–112)
Creatinine, Ser: 0.55 mg/dL (ref 0.40–1.20)
GFR: 103.17 mL/min (ref >60.00)
Glucose, Bld: 94 mg/dL (ref 70–99)
Potassium: 3.9 mEq/L (ref 3.5–5.1)
Sodium: 134 mEq/L — ABNORMAL LOW (ref 135–145)

## 2020-01-11 LAB — CBC
HCT: 43.7 % (ref 36.0–46.0)
Hemoglobin: 14.6 g/dL (ref 12.0–15.0)
MCHC: 33.4 g/dL (ref 30.0–36.0)
MCV: 95.5 fl (ref 78.0–100.0)
Platelets: 281 10*3/uL (ref 150.0–400.0)
RBC: 4.58 Mil/uL (ref 3.87–5.11)
RDW: 13.8 % (ref 11.5–15.5)
WBC: 8.7 10*3/uL (ref 4.0–10.5)

## 2020-01-11 LAB — VITAMIN D 25 HYDROXY (VIT D DEFICIENCY, FRACTURES): VITD: 28.73 ng/mL — ABNORMAL LOW (ref 30.00–100.00)

## 2020-01-11 NOTE — Assessment & Plan Note (Signed)
It sounds as though Evenity was approved for her.  We will have our clinical RN check on this.  We will work on getting this ordered for her.  Vitamin D rechecked today.

## 2020-01-11 NOTE — Assessment & Plan Note (Signed)
Recheck CBC. 

## 2020-01-11 NOTE — Assessment & Plan Note (Signed)
Heart rate acceptable today.  She will continue to monitor.  She will see cardiology as planned.

## 2020-01-11 NOTE — Assessment & Plan Note (Signed)
TSH minimally elevated.  Given lack of symptoms we will continue with her current regimen and check at her next visit.

## 2020-01-11 NOTE — Assessment & Plan Note (Signed)
Single occurrence since our last visit.  I suspect this is related to the temperature in her house.  Work-up was relatively unremarkable.  Discussed if she has these more consistently or frequently she needs to let us know and we could consider imaging.

## 2020-01-11 NOTE — Assessment & Plan Note (Signed)
Continues to be present.  She will see dermatology as planned.  Discussed referral to wound care as she may be able to see them sooner.  She declined that at this time.  Advised to let us know if there are any changes.

## 2020-01-11 NOTE — Patient Instructions (Signed)
Nice to see you. Please see cardiology as planned later this week. We will check labs and contact you with the results. If you have recurrent episodes of night sweats please let us know so I would suggest decreasing the temperature in your house by several degrees at night to see if that helps.

## 2020-01-11 NOTE — Assessment & Plan Note (Signed)
Check urine protein levels.

## 2020-01-11 NOTE — Progress Notes (Signed)
[name], MD Phone: [phone]  Amanda Soto is a 86 y.o. female who presents today for follow-up.  A. fib: Taking sotalol 40 mg twice daily and Eliquis.  Sotalol is managed by cardiology.  She does note her heart rate was going up into the 130s at times.  No palpitations.  No bleeding issues.  Sees cardiology later this week.  Hypothyroidism: Taking Synthroid.  No skin changes.  No heat or cold intolerance.  Night sweats: Patient has had 1 additional episode of these since her last visit.  Her work-up was previously pretty unremarkable for cause of this.  She does keep the temperature in her house at 39 F.  She sleeps with a sheet and a thin blanket.  Right lower leg wound: Continues to be present.  She does have a dermatology appointment on August 3.  There is occasional discomfort in this area.  Osteoporosis: Currently on vitamin D 2000 international units once daily.  She reports she was never contacted regarding the Evenity.  Social History   Tobacco Use  Smoking Status Never Smoker  Smokeless Tobacco Never Used     ROS see history of present illness  Objective  Physical Exam Vitals:   01/11/20 1003  BP: 115/80  Pulse: 70  Temp: (!) 97.3 F (36.3 C)  SpO2: 99%    BP Readings from Last 3 Encounters:  01/11/20 115/80  12/18/19 120/70  09/30/19 120/80   Wt Readings from Last 3 Encounters:  01/11/20 118 lb 3.2 oz (53.6 kg)  12/18/19 118 lb (53.5 kg)  09/30/19 123 lb (55.8 kg)    Physical Exam Constitutional:      General: She is not in acute distress.    Appearance: She is not diaphoretic.  Cardiovascular:     Rate and Rhythm: Normal rate. Rhythm irregularly irregular.     Heart sounds: Normal heart sounds.  Pulmonary:     Effort: Pulmonary effort is normal.     Breath sounds: Normal breath sounds.  Musculoskeletal:     Right lower leg: No edema.     Left lower leg: No edema.  Skin:    General: Skin is warm and dry.  Neurological:      Mental Status: She is alert.    Right inner lower leg, minimal tenderness on the proximal portion, no induration, no fluctuance, no warmth    Assessment/Plan: Please see individual problem list.  Hypothyroidism TSH minimally elevated.  Given lack of symptoms we will continue with her current regimen and check at her next visit.  OP (osteoporosis) It sounds as though Evenity was approved for her.  We will have our clinical RN check on this.  We will work on getting this ordered for her.  Vitamin D rechecked today.  Proteinuria Check urine protein levels.  Night sweats Single occurrence since our last visit.  I suspect this is related to the temperature in her house.  Work-up was relatively unremarkable.  Discussed if she has these more consistently or frequently she needs to let us know and we could consider imaging.  Elevated hemoglobin ([facility]) Recheck CBC.  Wound of right leg Continues to be present.  She will see dermatology as planned.  Discussed referral to wound care as she may be able to see them sooner.  She declined that at this time.  Advised to let us know if there are any changes.  Atrial fibrillation ([facility]) Heart rate acceptable today.  She will continue to monitor.  She will see cardiology  as planned.   Orders Placed This Encounter  Procedures  . CBC  . Protein / creatinine ratio, urine  . Vitamin D (25 hydroxy)  . Basic Metabolic Panel (BMET)    No orders of the defined types were placed in this encounter.   This visit occurred during the SARS-CoV-2 public health emergency.  Safety protocols were in place, including screening questions prior to the visit, additional usage of staff PPE, and extensive cleaning of exam room while observing appropriate contact time as indicated for disinfecting solutions.    [name], MD [place]

## 2020-01-12 LAB — PROTEIN / CREATININE RATIO, URINE
Creatinine, Urine: 90 mg/dL (ref 20–275)
Protein/Creat Ratio: 300 mg/g creat — ABNORMAL HIGH (ref 21–161)
Protein/Creatinine Ratio: 0.3 mg/mg creat — ABNORMAL HIGH (ref 0.021–0.16)
Total Protein, Urine: 27 mg/dL — ABNORMAL HIGH (ref 5–24)

## 2020-01-12 NOTE — Progress Notes (Signed)
Do we know whop ordered the Evenity approval and has she had an injection? I will still get approval for this office just because I would rather know the patient is covered since this so expensive. As  we discussed.

## 2020-01-14 DIAGNOSIS — I4811 Longstanding persistent atrial fibrillation: Secondary | ICD-10-CM | POA: Diagnosis not present

## 2020-01-14 DIAGNOSIS — I10 Essential (primary) hypertension: Secondary | ICD-10-CM | POA: Diagnosis not present

## 2020-01-14 DIAGNOSIS — R5382 Chronic fatigue, unspecified: Secondary | ICD-10-CM | POA: Diagnosis not present

## 2020-01-14 DIAGNOSIS — I495 Sick sinus syndrome: Secondary | ICD-10-CM | POA: Diagnosis not present

## 2020-01-14 DIAGNOSIS — R011 Cardiac murmur, unspecified: Secondary | ICD-10-CM | POA: Diagnosis not present

## 2020-01-19 NOTE — Progress Notes (Signed)
It is ok to start this.

## 2020-01-19 NOTE — Progress Notes (Signed)
Patient is approved for evenity and be started ASAP. Ok to start.

## 2020-01-19 NOTE — Progress Notes (Signed)
Patient has been scheduled for first injection. 

## 2020-01-25 ENCOUNTER — Telehealth: Payer: Self-pay | Admitting: *Deleted

## 2020-01-25 ENCOUNTER — Telehealth: Payer: Self-pay

## 2020-01-25 DIAGNOSIS — R809 Proteinuria, unspecified: Secondary | ICD-10-CM

## 2020-01-25 NOTE — Telephone Encounter (Signed)
patient stated she hasn't heard from anyone about her urology referral. Please advise. I do not see a referral.

## 2020-01-25 NOTE — Telephone Encounter (Signed)
The prior result note was not sent back to me. I have now placed the referral to nephrology for her.

## 2020-01-25 NOTE — Telephone Encounter (Signed)
Called patient . She will need to schedule VV appt with Dr Lowella Dandy  to discuss what procedure would be best for her.

## 2020-01-25 NOTE — Telephone Encounter (Signed)
Please call patient to schedule appt, either VV or in person

## 2020-01-25 NOTE — Addendum Note (Signed)
Addended by: [name] on: 01/25/2020 03:37 PM   Modules accepted: Orders

## 2020-01-26 ENCOUNTER — Telehealth: Payer: Self-pay

## 2020-01-26 ENCOUNTER — Encounter: Payer: Self-pay | Admitting: Pain Medicine

## 2020-01-26 ENCOUNTER — Ambulatory Visit: Payer: Medicare Other | Admitting: Pain Medicine

## 2020-01-26 NOTE — Progress Notes (Signed)
Patient: Amanda Soto  Service Category: E/M  Provider: Gaspar Cola, MD  DOB: September 10, 1924  DOS: 01/27/2020  Location: Office  MRN: [id number]  Setting: Ambulatory outpatient  Referring Provider: [name], MD  Type: Established Patient  Specialty: Interventional Pain Management  PCP: [name], MD  Location: Remote location  Delivery: TeleHealth     Virtual Encounter - Pain Management PROVIDER NOTE: Information contained herein reflects review and annotations entered in association with encounter. Interpretation of such information and data should be left to medically-trained personnel. Information provided to patient can be located elsewhere in the medical record under "Patient Instructions". Document created using STT-dictation technology, any transcriptional errors that may result from process are unintentional.    Contact & Pharmacy Preferred: [place]: [phone] (home) Mobile: [phone] (mobile) E-mail: [name]  [id number] [place] [address] [id number]: [phone]: [phone]  Pre-screening  Ms. Schirtzinger offered "in-person" vs "virtual" encounter. She indicated preferring virtual for this encounter.   Reason COVID-19*  Social distancing based on CDC and AMA recommendations.   I contacted PADILYNN BESSEYon 01/27/2020 via telephone.      I clearly identified myself as FGaspar Cola MD. I verified that I was speaking with the correct person using two identifiers (Name: [name] and date of birth: [birth year].  Consent I sought verbal advanced consent from PKatheren Pullerfor virtual visit interactions. I informed [name] of possible security and privacy concerns, risks, and limitations associated with providing "not-in-person" medical evaluation and management services. I also informed Ms. Dotson of the availability of "in-person" appointments. Finally, I informed  her that there would be a charge for the virtual visit and that she could be  personally, fully or partially, financially responsible for it. Ms. HDefrancescoexpressed understanding and agreed to proceed.   Historic Elements   Ms. PJOHNITA PALLESCHIis a 86y.o. year old, female patient evaluated today after her last contact with our practice on 01/26/2020. Ms. HBorgwardt has a past medical history of Acute postoperative pain (12/17/2016), Anemia (11/10/2015), Arthritis, Atrial fibrillation (HRhine, CHF (congestive heart failure) (HSan [place], Chickenpox, Chronic abdominal pain (01/16/2014), Closed Colles' fracture (12/17/2016), Degenerative arthritis of hip (08/19/2014), Degenerative arthritis of lumbar spine (11/18/2013), Depression, Diverticulitis, GERD (gastroesophageal reflux disease), Heart murmur, Heart rate slow, Hyperlipidemia, Hypertension, Hypothyroid, Neuritis or radiculitis due to rupture of lumbar intervertebral disc (11/18/2013), Skin cancer, Symptomatic anemia (11/09/2015), and Trochanteric bursitis of right hip (11/18/2013). She also  has a past surgical history that includes Cataract extraction; Partial hysterectomy; Appendectomy; and Tonsillectomy. Ms. HAdanhas a current medication list which includes the following prescription(s): acetaminophen, apixaban, vitamin d3, fluticasone, furosemide, multiple vitamins-minerals, omeprazole, sotalol, synthroid, tramadol, triamcinolone cream, and vitamin b-12. She  reports that she has never smoked. She has never used smokeless tobacco. She reports current alcohol use of about 1.0 standard drink of alcohol per week. She reports that she does not use drugs. Ms. HFeeseris allergic to hydrocodone, ciprofloxacin, and latex.   HPI  Today, she is being contacted for worsening of previously known (established) problem.  The patient indicates that she is having a flareup of her right sided low back pain at the level of her waist.  She denies any pain going down the legs or in the area of the  tailbone.  She has a confirmed lumbar facet syndrome which we treated with radiofrequency ablation in 2019.  This  seems to have lasted until now.  It is very likely that her radiofrequency ablation has worn off and it is time for her to have it repeated.  We will go ahead and bring her in for a diagnostic injection if she gets good relief then we will plan on repeating that radiofrequency.  The patient is on Eliquis and therefore we need to stop it for 3 days prior to the procedure.  I initially offered the patient to have her come in on Tuesday for her block, but she says that she has been waiting for an appointment with the dermatologist to evaluate a lesion that she has in the legs that apparently everybody has thought may be a cancer.  She is a little nervous about this and therefore she does not want to miss that particular appointment.  She indicated that that appointment was for 2 PM.  I did offer to have her come in earlier, but she indicated that she may have some problems with transportation.  Pharmacotherapy Assessment  Analgesic: Tramadol 50 mg, 1 tablet PO q 6 hrs (200 mg/day of tramadol) MME/day: 20 mg/day.   Monitoring: [place] PMP: PDMP reviewed during this encounter.       Pharmacotherapy: No side-effects or adverse reactions reported. Compliance: No problems identified. Effectiveness: Clinically acceptable. Plan: Refer to "POC".  UDS: No results found for: SUMMARY  Laboratory Chemistry Profile   Renal Lab Results  Component Value Date   BUN 16 01/11/2020   CREATININE 0.55 01/11/2020   LABCREA 90 01/11/2020   BCR 28 03/30/2019   GFR 103.17 01/11/2020   GFRAA >60 12/18/2019   GFRNONAA >60 12/18/2019     Hepatic Lab Results  Component Value Date   AST 22 12/18/2019   ALT 15 12/18/2019   ALBUMIN 4.2 12/18/2019   ALKPHOS 56 12/18/2019   LIPASE 181 11/02/2013     Electrolytes Lab Results  Component Value Date   NA 134 (L) 01/11/2020   K 3.9 01/11/2020   CL 96  01/11/2020   CALCIUM 9.5 01/11/2020   MG 2.0 03/30/2019     Bone Lab Results  Component Value Date   VD25OH 28.73 (L) 01/11/2020   25OHVITD1 14 (L) 03/30/2019   25OHVITD2 <1.0 03/30/2019   25OHVITD3 14 03/30/2019     Inflammation (CRP: Acute Phase) (ESR: Chronic Phase) Lab Results  Component Value Date   CRP <1 03/30/2019   ESRSEDRATE 50 (H) 03/30/2019       Note: Above Lab results reviewed.   Imaging  DG Bone Density EXAM: DUAL X-RAY ABSORPTIOMETRY (DXA) FOR BONE MINERAL DENSITY  IMPRESSION: Your patient Filomena Pokorney completed a BMD test on 10/05/2019 using the [place] (software version: 14.10) manufactured by [employer]. The following summarizes the results of our evaluation. Technologist:VLM PATIENT BIOGRAPHICAL: Name: Chinenye, Katzenberger Patient ID: [id number] Birth Date: Aug 21, 1924 Height: 57.0 in. Gender: Female Exam Date: 10/05/2019 Weight: 123.0 lbs. Indications: Advanced Age, Caucasian, Height Loss, History of Fracture (Adult), Hypothyroid, Hysterectomy, Postmenopausal Fractures: Left wrist Treatments: Cortisone Injections, Synthroid DENSITOMETRY RESULTS: Site          Region     Measured Date Measured Age WHO Classification Young Adult T-score BMD         %Change vs. Previous Significant Change (*) DualFemur Neck Right 10/05/2019 92.6 Osteoporosis -3.2 0.590 g/cm2 -9.5% Yes DualFemur Neck Right 09[age]26/2016 89.6 Osteoporosis -2.8 0.652 g/cm2 - -  DualFemur Total Mean 10/05/2019 92.6 Osteoporosis -3.1 0.615 g/cm2 -4.7% - DualFemur  Total Mean 09/26/2016 89.6 Osteoporosis -2.9 0.645 g/cm2 - -  Right Forearm Radius 33% 10/05/2019 92.6 Osteoporosis -5.3 0.415 g/cm2 - - ASSESSMENT: The BMD measured at Forearm Radius 33% is 0.415 g/cm2 with a T-score of -5.3. This patient is considered osteoporotic according to [facility]) criteria. Lumbar spine was not utilized due to advanced degenerative changes. Compared  with prior study, there has been significant decrease in the total hip. The scan quality is good.  World Pharmacologist [facility]) criteria for post-menopausal, Caucasian Women: Normal:                   T-score at or above -1 SD Osteopenia/low bone mass: T-score between -1 and -2.5 SD Osteoporosis:             T-score at or below -2.5 SD  RECOMMENDATIONS: 1. All patients should optimize calcium and vitamin D intake. 2. Consider FDA-approved medical therapies in postmenopausal women and men aged 52 years and older, based on the following: a. A hip or vertebral(clinical or morphometric) fracture b. T-score < -2.5 at the femoral neck or spine after appropriate evaluation to exclude secondary causes c. Low bone mass (T-score between -1.0 and -2.5 at the femoral neck or spine) and a 10-year probability of a hip fracture > 3% or a 10-year probability of a major osteoporosis-related fracture > 20% based on the [place]-adapted WHO algorithm 3. Clinician judgment and/or patient preferences may indicate treatment for people with 10-year fracture probabilities above or below these levels FOLLOW-UP: People with diagnosed cases of osteoporosis or at high risk for fracture should have regular bone mineral density tests. For patients eligible for Medicare, routine testing is allowed once every 2 years. The testing frequency can be increased to one year for patients who have rapidly progressing disease, those who are receiving or discontinuing medical therapy to restore bone mass, or have additional risk factors.  I have reviewed this report, and agree with the above findings.  [facility]  Electronically Signed   By: Lowella Grip III M.D.   On: 10/05/2019 14:23  Assessment  The primary encounter diagnosis was Chronic pain syndrome. Diagnoses of Chronic low back pain (Primary Area of Pain) (Right), Lumbar facet syndrome (Right), and Chronic anticoagulation (Eliquis) were  also pertinent to this visit.  Plan of Care  Problem-specific:  No problem-specific Assessment & Plan notes found for this encounter.  Ms. FAUSTINA GEBERT has a current medication list which includes the following long-term medication(s): apixaban, fluticasone, furosemide, sotalol, synthroid, and tramadol.  Pharmacotherapy (Medications Ordered): No orders of the defined types were placed in this encounter.  Orders:  Orders Placed This Encounter  Procedures  . LUMBAR FACET(MEDIAL BRANCH NERVE BLOCK) MBNB    Standing Status:   Future    Standing Expiration Date:   02/27/2020    Scheduling Instructions:     Procedure: Lumbar facet block (AKA.: Lumbosacral medial branch nerve block)     Side: Right-sided     Level: L3-4, L4-5, & L5-S1 Facets (L2, L3, L4, L5, & S1 Medial Branch Nerves)     Sedation: Patient's choice.     Timeframe: ASAA    Order Specific Question:   Where will this procedure be performed?    Answer:   [facility]  . Blood Thinner Instructions to Nursing    If the patient requires a Lovenox-bridge therapy, make sure arrangements are made to institute it with the assistance of the PCP.    Standing  Status:   Standing    Number of Occurrences:   36    Standing Expiration Date:   03/29/2020    Scheduling Instructions:     Always stop the Eliquis (Apixaban) x 3 days prior to procedure or surgery.   Follow-up plan:   Return for Procedure (no sedation): (R) L-FCT BLK, (Blood Thinner Protocol).      Considering: NOTE:Stop Eliquisfor 3 daysprior to aprocedure.    Palliative PRN treatment(s): Palliative/therapeutic caudal ESI #2 (100/100/90/90) Palliative/Therapeuticright lumbar facet RFA #3(last done 03/27/2018) (100/100/100/90) (100/90/0) Palliative/therapeutic right-sidedSIjoint RFA#1 (last done 03/27/2018) (100/90/0) Palliative right lumbar facet block#4(100/100/90/75) Palliative right sacroiliac joint block#4    Recent Visits No visits were  found meeting these conditions. Showing recent visits within past 90 days and meeting all other requirements Today's Visits Date Type Provider Dept  01/27/20 Telemedicine Milinda Pointer, MD [facility]  Showing today's visits and meeting all other requirements Future Appointments Date Type Provider Dept  03/23/20 Appointment Milinda Pointer, MD [facility]  Showing future appointments within next 90 days and meeting all other requirements  I discussed the assessment and treatment plan with the patient. The patient was provided an opportunity to ask questions and all were answered. The patient agreed with the plan and demonstrated an understanding of the instructions.  Patient advised to call back or seek an in-person evaluation if the symptoms or condition worsens.  Duration of encounter: 15 minutes.  Note by: Gaspar Cola, MD Date: 01/27/2020; Time: 1:27 PM

## 2020-01-26 NOTE — Telephone Encounter (Signed)
Attempted to call patient and go over pre virtual appointment questions.  Left message.  °

## 2020-01-27 ENCOUNTER — Encounter: Payer: Self-pay | Admitting: Pain Medicine

## 2020-01-27 ENCOUNTER — Ambulatory Visit: Payer: Medicare Other | Attending: Pain Medicine | Admitting: Pain Medicine

## 2020-01-27 ENCOUNTER — Telehealth: Payer: Self-pay

## 2020-01-27 ENCOUNTER — Other Ambulatory Visit: Payer: Self-pay

## 2020-01-27 DIAGNOSIS — R011 Cardiac murmur, unspecified: Secondary | ICD-10-CM | POA: Insufficient documentation

## 2020-01-27 DIAGNOSIS — M47816 Spondylosis without myelopathy or radiculopathy, lumbar region: Secondary | ICD-10-CM

## 2020-01-27 DIAGNOSIS — G894 Chronic pain syndrome: Secondary | ICD-10-CM | POA: Diagnosis not present

## 2020-01-27 DIAGNOSIS — M545 Low back pain: Secondary | ICD-10-CM

## 2020-01-27 DIAGNOSIS — Z7901 Long term (current) use of anticoagulants: Secondary | ICD-10-CM | POA: Diagnosis not present

## 2020-01-27 DIAGNOSIS — G8929 Other chronic pain: Secondary | ICD-10-CM | POA: Diagnosis not present

## 2020-01-27 NOTE — Patient Instructions (Signed)
____________________________________________________________________________________________  Preparing for Procedure with Sedation  Procedure appointments are limited to planned procedures: . No Prescription Refills. . No disability issues will be discussed. . No medication changes will be discussed.  Instructions: . Oral Intake: Do not eat or drink anything for at least 8 hours prior to your procedure. (Exception: Blood Pressure Medication. See below.) . Transportation: Unless otherwise stated by your physician, you may drive yourself after the procedure. . Blood Pressure Medicine: Do not forget to take your blood pressure medicine with a sip of water the morning of the procedure. If your Diastolic (lower reading)is above 100 mmHg, elective cases will be cancelled/rescheduled. . Blood thinners: These will need to be stopped for procedures. Notify our staff if you are taking any blood thinners. Depending on which one you take, there will be specific instructions on how and when to stop it. . Diabetics on insulin: Notify the staff so that you can be scheduled 1st case in the morning. If your diabetes requires high dose insulin, take only  of your normal insulin dose the morning of the procedure and notify the staff that you have done so. . Preventing infections: Shower with an antibacterial soap the morning of your procedure. . Build-up your immune system: Take 1000 mg of Vitamin C with every meal (3 times a day) the day prior to your procedure. . Antibiotics: Inform the staff if you have a condition or reason that requires you to take antibiotics before dental procedures. . Pregnancy: If you are pregnant, call and cancel the procedure. . Sickness: If you have a cold, fever, or any active infections, call and cancel the procedure. . Arrival: You must be in the facility at least 30 minutes prior to your scheduled procedure. . Children: Do not bring children with you. . Dress appropriately:  Bring dark clothing that you would not mind if they get stained. . Valuables: Do not bring any jewelry or valuables.  Reasons to call and reschedule or cancel your procedure: (Following these recommendations will minimize the risk of a serious complication.) . Surgeries: Avoid having procedures within 2 weeks of any surgery. (Avoid for 2 weeks before or after any surgery). . Flu Shots: Avoid having procedures within 2 weeks of a flu shots or . (Avoid for 2 weeks before or after immunizations). . Barium: Avoid having a procedure within 7-10 days after having had a radiological study involving the use of radiological contrast. (Myelograms, Barium swallow or enema study). . Heart attacks: Avoid any elective procedures or surgeries for the initial 6 months after a "Myocardial Infarction" (Heart Attack). . Blood thinners: It is imperative that you stop these medications before procedures. Let us know if you if you take any blood thinner.  . Infection: Avoid procedures during or within two weeks of an infection (including chest colds or gastrointestinal problems). Symptoms associated with infections include: Localized redness, fever, chills, night sweats or profuse sweating, burning sensation when voiding, cough, congestion, stuffiness, runny nose, sore throat, diarrhea, nausea, vomiting, cold or Flu symptoms, recent or current infections. It is specially important if the infection is over the area that we intend to treat. . Heart and lung problems: Symptoms that may suggest an active cardiopulmonary problem include: cough, chest pain, breathing difficulties or shortness of breath, dizziness, ankle swelling, uncontrolled high or unusually low blood pressure, and/or palpitations. If you are experiencing any of these symptoms, cancel your procedure and contact your primary care physician for an evaluation.  Remember:  Regular Business hours are:    Monday to Thursday 8:00 AM to 4:00 PM  Provider's  Schedule: Clayden Withem, MD:  Procedure days: Tuesday and Thursday 7:30 AM to 4:00 PM  Bilal Lateef, MD:  Procedure days: Monday and Wednesday 7:30 AM to 4:00 PM ____________________________________________________________________________________________   ____________________________________________________________________________________________  Blood Thinners  IMPORTANT NOTICE:  If you take any of these, make sure to notify the nursing staff.  Failure to do so may result in injury.  Recommended time intervals to stop and restart blood-thinners, before & after invasive procedures  Generic Name Brand Name Stop Time. Must be stopped at least this long before procedures. After procedures, wait at least this long before re-starting.  Abciximab Reopro 15 days 2 hrs  Alteplase Activase 10 days 10 days  Anagrelide Agrylin    Apixaban Eliquis 3 days 6 hrs  Cilostazol Pletal 3 days 5 hrs  Clopidogrel Plavix 7-10 days 2 hrs  Dabigatran Pradaxa 5 days 6 hrs  Dalteparin Fragmin 24 hours 4 hrs  Dipyridamole Aggrenox 11days 2 hrs  Edoxaban Lixiana; Savaysa 3 days 2 hrs  Enoxaparin  Lovenox 24 hours 4 hrs  Eptifibatide Integrillin 8 hours 2 hrs  Fondaparinux  Arixtra 72 hours 12 hrs  Prasugrel Effient 7-10 days 6 hrs  Reteplase Retavase 10 days 10 days  Rivaroxaban Xarelto 3 days 6 hrs  Ticagrelor Brilinta 5-7 days 6 hrs  Ticlopidine Ticlid 10-14 days 2 hrs  Tinzaparin Innohep 24 hours 4 hrs  Tirofiban Aggrastat 8 hours 2 hrs  Warfarin Coumadin 5 days 2 hrs   Other medications with blood-thinning effects  Product indications Generic (Brand) names Note  Cholesterol Lipitor Stop 4 days before procedure  Blood thinner (injectable) Heparin (LMW or LMWH Heparin) Stop 24 hours before procedure  Cancer Ibrutinib (Imbruvica) Stop 7 days before procedure  Malaria/Rheumatoid Hydroxychloroquine (Plaquenil) Stop 11 days before procedure  Thrombolytics  10 days before or after procedures    Over-the-counter (OTC) Products with blood-thinning effects  Product Common names Stop Time  Aspirin > 325 mg Goody Powders, Excedrin, etc. 11 days  Aspirin ? 81 mg  7 days  Fish oil  4 days  Garlic supplements  7 days  Ginkgo biloba  36 hours  Ginseng  24 hours  NSAIDs Ibuprofen, Naprosyn, etc. 3 days  Vitamin E  4 days   ____________________________________________________________________________________________  ____________________________________________________________________________________________  General Risks and Possible Complications  Patient Responsibilities: It is important that you read this as it is part of your informed consent. It is our duty to inform you of the risks and possible complications associated with treatments offered to you. It is your responsibility as a patient to read this and to ask questions about anything that is not clear or that you believe was not covered in this document.  Patient's Rights: You have the right to refuse treatment. You also have the right to change your mind, even after initially having agreed to have the treatment done. However, under this last option, if you wait until the last second to change your mind, you may be charged for the materials used up to that point.  Introduction: Medicine is not an exact science. Everything in Medicine, including the lack of treatment(s), carries the potential for danger, harm, or loss (which is by definition: Risk). In Medicine, a complication is a secondary problem, condition, or disease that can aggravate an already existing one. All treatments carry the risk of possible complications. The fact that a side effects or complications occurs, does not imply that the treatment was conducted   incorrectly. It must be clearly understood that these can happen even when everything is done following the highest safety standards.  No treatment: You can choose not to proceed with the proposed treatment  alternative. The "PRO(s)" would include: avoiding the risk of complications associated with the therapy. The "CON(s)" would include: not getting any of the treatment benefits. These benefits fall under one of three categories: diagnostic; therapeutic; and/or palliative. Diagnostic benefits include: getting information which can ultimately lead to improvement of the disease or symptom(s). Therapeutic benefits are those associated with the successful treatment of the disease. Finally, palliative benefits are those related to the decrease of the primary symptoms, without necessarily curing the condition (example: decreasing the pain from a flare-up of a chronic condition, such as incurable terminal cancer).  General Risks and Complications: These are associated to most interventional treatments. They can occur alone, or in combination. They fall under one of the following six (6) categories: no benefit or worsening of symptoms; bleeding; infection; nerve damage; allergic reactions; and/or death. 1. No benefits or worsening of symptoms: In Medicine there are no guarantees, only probabilities. No healthcare provider can ever guarantee that a medical treatment will work, they can only state the probability that it may. Furthermore, there is always the possibility that the condition may worsen, either directly, or indirectly, as a consequence of the treatment. 2. Bleeding: This is more common if the patient is taking a blood thinner, either prescription or over the counter (example: Goody Powders, Fish oil, Aspirin, Garlic, etc.), or if suffering a condition associated with impaired coagulation (example: Hemophilia, cirrhosis of the liver, low platelet counts, etc.). However, even if you do not have one on these, it can still happen. If you have any of these conditions, or take one of these drugs, make sure to notify your treating physician. 3. Infection: This is more common in patients with a compromised immune  system, either due to disease (example: diabetes, cancer, human immunodeficiency virus [HIV], etc.), or due to medications or treatments (example: therapies used to treat cancer and rheumatological diseases). However, even if you do not have one on these, it can still happen. If you have any of these conditions, or take one of these drugs, make sure to notify your treating physician. 4. Nerve Damage: This is more common when the treatment is an invasive one, but it can also happen with the use of medications, such as those used in the treatment of cancer. The damage can occur to small secondary nerves, or to large primary ones, such as those in the spinal cord and brain. This damage may be temporary or permanent and it may lead to impairments that can range from temporary numbness to permanent paralysis and/or brain death. 5. Allergic Reactions: Any time a substance or material comes in contact with our body, there is the possibility of an allergic reaction. These can range from a mild skin rash (contact dermatitis) to a severe systemic reaction (anaphylactic reaction), which can result in death. 6. Death: In general, any medical intervention can result in death, most of the time due to an unforeseen complication. ____________________________________________________________________________________________   

## 2020-01-27 NOTE — Telephone Encounter (Signed)
Tried calling patient to schedule procedure and check out for today but no answer on home or cell phones.

## 2020-01-28 ENCOUNTER — Other Ambulatory Visit: Payer: Self-pay

## 2020-01-28 ENCOUNTER — Ambulatory Visit (INDEPENDENT_AMBULATORY_CARE_PROVIDER_SITE_OTHER): Payer: Medicare Other

## 2020-01-28 DIAGNOSIS — M810 Age-related osteoporosis without current pathological fracture: Secondary | ICD-10-CM | POA: Diagnosis not present

## 2020-01-28 MED ORDER — ROMOSOZUMAB-AQQG 105 MG/1.17ML ~~LOC~~ SOSY
210.0000 mg | PREFILLED_SYRINGE | Freq: Once | SUBCUTANEOUS | Status: AC
  Administered 2020-01-28: 210 mg via SUBCUTANEOUS

## 2020-01-28 NOTE — Progress Notes (Signed)
Patient presented for Evenity injection to left and right SQ, patient voiced no concerns nor showed any signs of distress during injection.

## 2020-01-29 ENCOUNTER — Telehealth: Payer: Self-pay | Admitting: Family Medicine

## 2020-01-29 NOTE — Telephone Encounter (Signed)
Pt called in wanted to know if she is suppose to continue taking her other medications after she got her evenity inj.

## 2020-01-29 NOTE — Telephone Encounter (Signed)
I agree with the advice that was given

## 2020-01-29 NOTE — Telephone Encounter (Signed)
I called and spoke with the patient and informed her that her medication would not interfere with the Evenity injection.  She understood to continue to take her medication.  Patient also asked about the referral to nephrology and I gave her the number to call and schedule because the referral was in the system and she stated she would call.  Arlo Butt,cma

## 2020-02-02 ENCOUNTER — Other Ambulatory Visit: Payer: Self-pay

## 2020-02-02 ENCOUNTER — Ambulatory Visit: Payer: Medicare Other | Admitting: Dermatology

## 2020-02-02 ENCOUNTER — Encounter: Payer: Self-pay | Admitting: Dermatology

## 2020-02-02 DIAGNOSIS — L0100 Impetigo, unspecified: Secondary | ICD-10-CM | POA: Diagnosis not present

## 2020-02-02 DIAGNOSIS — L821 Other seborrheic keratosis: Secondary | ICD-10-CM | POA: Diagnosis not present

## 2020-02-02 DIAGNOSIS — L820 Inflamed seborrheic keratosis: Secondary | ICD-10-CM

## 2020-02-02 MED ORDER — MUPIROCIN 2 % EX OINT: TOPICAL_OINTMENT | CUTANEOUS | 1 refills | Status: DC

## 2020-02-02 NOTE — Patient Instructions (Addendum)
Gold Bond Rough and Bumpy all over legs 1-2 times daily.  Apply Mupirocin 2% Ointment to affected area on right lower leg 1-2 times a day and cover with a non-stick pad (2" x 3" pad) and wrap.   Cryotherapy Aftercare  . Wash gently with soap and water everyday.   Marland Kitchen Apply Vaseline and Band-Aid daily until healed.

## 2020-02-02 NOTE — Progress Notes (Signed)
   Follow-Up Visit   Subjective  Amanda Soto is a 86 y.o. female who presents for the following: bump (right medial lower leg. Patients cat jumped up and hit her leg a few months ago. She is using Bactroban on this area, which is improving.) and growths (chest, rough feeling).  She also has a dark spot on her L shoulder that gets irritated.   The following portions of the chart were reviewed this encounter and updated as appropriate:      Review of Systems:  No other skin or systemic complaints except as noted in HPI or Assessment and Plan.  Objective  Well appearing patient in no apparent distress; mood and affect are within normal limits.  A focused examination was performed including legs, chest. Relevant physical exam findings are noted in the Assessment and Plan.  Objective  Left Medial Shoulder: Erythematous keratotic or waxy stuck-on papule    Objective  Chest: Stuck-on, waxy, tan-brown papule or plaque --Discussed benign etiology and prognosis.   Objective  Right Medial Lower Leg: Yellow soft-crusted plaque, crust removed with gentle debridement, no underlying ulceration.  Surrounding dyspigmentation c/w stasis changes.  Images       Assessment & Plan    Inflamed seborrheic keratosis Left Medial Shoulder    Destruction of lesion - Left Medial Shoulder  Destruction method: cryotherapy   Informed consent: discussed and consent obtained   Lesion destroyed using liquid nitrogen: Yes   Region frozen until ice ball extended beyond lesion: Yes   Outcome: patient tolerated procedure well with no complications   Post-procedure details: wound care instructions given    Seborrheic keratosis Chest  Recommend Gold Bond Rough and Bumpy to chest  Impetigo Right Medial Lower Leg  vs ISK, with stasis changes  Wash area with soap and water daily mupirocin 2% ointment Apply to affected area 1-2 times a day and cover. Gold Bond Rough and Bumpy cream to lower  legs daily  mupirocin ointment (BACTROBAN) 2 % - Right Medial Lower Leg  Return in about 1 month (around 03/04/2020) for f/u Impetigo vs ISK.   I, Jamesetta Orleans, CMA, am acting as scribe for Brendolyn Patty, MD .  Documentation: I have reviewed the above documentation for accuracy and completeness, and I agree with the above.  Brendolyn Patty MD

## 2020-02-04 ENCOUNTER — Encounter: Payer: Self-pay | Admitting: Pain Medicine

## 2020-02-04 ENCOUNTER — Ambulatory Visit
Admission: RE | Admit: 2020-02-04 | Discharge: 2020-02-04 | Disposition: A | Discharge status: Home / Self Care | Payer: Medicare Other | Source: Ambulatory Visit | Attending: Pain Medicine | Admitting: Pain Medicine

## 2020-02-04 ENCOUNTER — Other Ambulatory Visit: Payer: Self-pay

## 2020-02-04 ENCOUNTER — Ambulatory Visit (HOSPITAL_BASED_OUTPATIENT_CLINIC_OR_DEPARTMENT_OTHER): Payer: Medicare Other | Admitting: Pain Medicine

## 2020-02-04 VITALS — BP 96/52 | HR 106 | Temp 98.2°F | Resp 13 | Ht 59.0 in | Wt 118.0 lb

## 2020-02-04 DIAGNOSIS — Z7901 Long term (current) use of anticoagulants: Secondary | ICD-10-CM | POA: Insufficient documentation

## 2020-02-04 DIAGNOSIS — G8929 Other chronic pain: Secondary | ICD-10-CM

## 2020-02-04 DIAGNOSIS — M545 Low back pain: Secondary | ICD-10-CM | POA: Insufficient documentation

## 2020-02-04 DIAGNOSIS — M47817 Spondylosis without myelopathy or radiculopathy, lumbosacral region: Secondary | ICD-10-CM | POA: Insufficient documentation

## 2020-02-04 DIAGNOSIS — M4310 Spondylolisthesis, site unspecified: Secondary | ICD-10-CM

## 2020-02-04 DIAGNOSIS — M47816 Spondylosis without myelopathy or radiculopathy, lumbar region: Secondary | ICD-10-CM | POA: Insufficient documentation

## 2020-02-04 DIAGNOSIS — Z91040 Latex allergy status: Secondary | ICD-10-CM | POA: Diagnosis not present

## 2020-02-04 MED ORDER — TRIAMCINOLONE ACETONIDE 40 MG/ML IJ SUSP
40.0000 mg | Freq: Once | INTRAMUSCULAR | Status: AC
  Administered 2020-02-04: 40 mg
  Filled 2020-02-04: qty 1

## 2020-02-04 MED ORDER — FENTANYL CITRATE (PF) 100 MCG/2ML IJ SOLN
25.0000 ug | INTRAMUSCULAR | Status: DC | PRN
  Administered 2020-02-04: 50 ug via INTRAVENOUS
  Filled 2020-02-04: qty 2

## 2020-02-04 MED ORDER — LIDOCAINE HCL 2 % IJ SOLN
20.0000 mL | Freq: Once | INTRAMUSCULAR | Status: AC
  Administered 2020-02-04: 200 mg
  Filled 2020-02-04: qty 10

## 2020-02-04 MED ORDER — MIDAZOLAM HCL 5 MG/5ML IJ SOLN
1.0000 mg | INTRAMUSCULAR | Status: DC | PRN
  Administered 2020-02-04: 1 mg via INTRAVENOUS
  Filled 2020-02-04: qty 5

## 2020-02-04 MED ORDER — LACTATED RINGERS IV SOLN
1000.0000 mL | Freq: Once | INTRAVENOUS | Status: AC
  Administered 2020-02-04: 1000 mL via INTRAVENOUS

## 2020-02-04 MED ORDER — ROPIVACAINE HCL 2 MG/ML IJ SOLN
9.0000 mL | Freq: Once | INTRAMUSCULAR | Status: AC
  Administered 2020-02-04: 10 mL via PERINEURAL
  Filled 2020-02-04: qty 10

## 2020-02-04 NOTE — Patient Instructions (Signed)

## 2020-02-04 NOTE — Progress Notes (Signed)
PROVIDER NOTE: Information contained herein reflects review and annotations entered in association with encounter. Interpretation of such information and data should be left to medically-trained personnel. Information provided to patient can be located elsewhere in the medical record under "Patient Instructions". Document created using STT-dictation technology, any transcriptional errors that may result from process are unintentional.    Patient: Amanda Soto  Service Category: Procedure  Provider: Gaspar Cola, MD  DOB: [birth year]  DOS: 02/04/2020  Location: [facility]  MRN: [id number]  Setting: Ambulatory - outpatient  Referring Provider: Milinda Pointer, MD  Type: Established Patient  Specialty: Interventional Pain Management  PCP: [name], MD   Primary Reason for Visit: Interventional Pain Management Treatment. CC: Back Pain (low)  Procedure:          Anesthesia, Analgesia, Anxiolysis:  Type: Lumbar Facet, Medial Branch Block(s) #4  Primary Purpose: Diagnostic Region: Posterolateral Lumbosacral Spine Level: L2, L3, L4, L5, & S1 Medial Branch Level(s). Injecting these levels blocks the L3-4, L4-5, and L5-S1 lumbar facet joints. Laterality: Right  Type: Moderate (Conscious) Sedation combined with Local Anesthesia Indication(s): Analgesia and Anxiety Route: Intravenous (IV) IV Access: Secured Sedation: Meaningful verbal contact was maintained at all times during the procedure  Local Anesthetic: Lidocaine 1-2%  Position: Prone   Indications: 1. Lumbar facet syndrome (Right)   2. Spondylosis without myelopathy or radiculopathy, lumbosacral region   3. Lumbar facet arthropathy   4. Grade 1 Anterolisthesis of L4 over L5 (5 mm)   5. Chronic low back pain (Primary Area of Pain) (Right)    Chronic anticoagulation (Eliquis)    History of allergy to latex    Latex precautions, history of latex allergy    Pain Score: Pre-procedure: 10-Worst pain  ever/10 Post-procedure: 0-No pain/10   Pre-op Assessment:  Amanda Soto is a 86 y.o. (year old), female patient, seen today for interventional treatment. She  has a past surgical history that includes Cataract extraction; Partial hysterectomy; Appendectomy; and Tonsillectomy. Amanda Soto has a current medication list which includes the following prescription(s): acetaminophen, apixaban, vitamin d3, fluticasone, furosemide, multiple vitamins-minerals, mupirocin ointment, omeprazole, sotalol, synthroid, tramadol, triamcinolone cream, and vitamin b-12, and the following Facility-Administered Medications: fentanyl and midazolam. Her primarily concern today is the Back Pain (low)  Initial Vital Signs:  Pulse/HCG Rate: (!) 106ECG Heart Rate: (!) 108 (A. fib) Temp: 98 F (36.7 C) Resp: 18 BP: (!) 101/57 SpO2: 99 %  BMI: Estimated body mass index is 23.83 kg/m as calculated from the following:   Height as of this encounter: 4\' 11"  (1.499 m).   Weight as of this encounter: 118 lb (53.5 kg).  Risk Assessment: Allergies: Reviewed. She is allergic to hydrocodone, ciprofloxacin, and latex.  Allergy Precautions: None required Coagulopathies: Reviewed. None identified.  Blood-thinner therapy: None at this time Active Infection(s): Reviewed. None identified. Amanda Soto is afebrile  Site Confirmation: Amanda Soto was asked to confirm the procedure and laterality before marking the site Procedure checklist: Completed Consent: Before the procedure and under the influence of no sedative(s), amnesic(s), or anxiolytics, the patient was informed of the treatment options, risks and possible complications. To fulfill our ethical and legal obligations, as recommended by the American Medical Association's Code of Ethics, I have informed the patient of my clinical impression; the nature and purpose of the treatment or procedure; the risks, benefits, and possible complications of the intervention; the alternatives, including  doing nothing; the risk(s) and benefit(s) of the alternative treatment(s) or procedure(s); and the risk(s)  and benefit(s) of doing nothing. The patient was provided information about the general risks and possible complications associated with the procedure. These may include, but are not limited to: failure to achieve desired goals, infection, bleeding, organ or nerve damage, allergic reactions, paralysis, and death. In addition, the patient was informed of those risks and complications associated to Spine-related procedures, such as failure to decrease pain; infection (i.e.: Meningitis, epidural or intraspinal abscess); bleeding (i.e.: epidural hematoma, subarachnoid hemorrhage, or any other type of intraspinal or peri-dural bleeding); organ or nerve damage (i.e.: Any type of peripheral nerve, nerve root, or spinal cord injury) with subsequent damage to sensory, motor, and/or autonomic systems, resulting in permanent pain, numbness, and/or weakness of one or several areas of the body; allergic reactions; (i.e.: anaphylactic reaction); and/or death. Furthermore, the patient was informed of those risks and complications associated with the medications. These include, but are not limited to: allergic reactions (i.e.: anaphylactic or anaphylactoid reaction(s)); adrenal axis suppression; blood sugar elevation that in diabetics may result in ketoacidosis or comma; water retention that in patients with history of congestive heart failure may result in shortness of breath, pulmonary edema, and decompensation with resultant heart failure; weight gain; swelling or edema; medication-induced neural toxicity; particulate matter embolism and blood vessel occlusion with resultant organ, and/or nervous system infarction; and/or aseptic necrosis of one or more joints. Finally, the patient was informed that Medicine is not an exact science; therefore, there is also the possibility of unforeseen or unpredictable risks and/or  possible complications that may result in a catastrophic outcome. The patient indicated having understood very clearly. We have given the patient no guarantees and we have made no promises. Enough time was given to the patient to ask questions, all of which were answered to the patient's satisfaction. Ms. Laviolette has indicated that she wanted to continue with the procedure. Attestation: I, the ordering provider, attest that I have discussed with the patient the benefits, risks, side-effects, alternatives, likelihood of achieving goals, and potential problems during recovery for the procedure that I have provided informed consent. Date  Time: 02/04/2020  9:16 AM  Pre-Procedure Preparation:  Monitoring: As per clinic protocol. Respiration, ETCO2, SpO2, BP, heart rate and rhythm monitor placed and checked for adequate function Safety Precautions: Patient was assessed for positional comfort and pressure points before starting the procedure. Time-out: I initiated and conducted the "Time-out" before starting the procedure, as per protocol. The patient was asked to participate by confirming the accuracy of the "Time Out" information. Verification of the correct person, site, and procedure were performed and confirmed by me, the nursing staff, and the patient. "Time-out" conducted as per Joint Commission's Universal Protocol (UP.01.01.01). Time: 1046  Description of Procedure:          Laterality: Right Levels:  L2, L3, L4, L5, & S1 Medial Branch Level(s) Area Prepped: Posterior Lumbosacral Region DuraPrep (Iodine Povacrylex [0.7% available iodine] and Isopropyl Alcohol, 74% w/w) Safety Precautions: Aspiration looking for blood return was conducted prior to all injections. At no point did we inject any substances, as a needle was being advanced. Before injecting, the patient was told to immediately notify me if she was experiencing any new onset of "ringing in the ears, or metallic taste in the mouth". No  attempts were made at seeking any paresthesias. Safe injection practices and needle disposal techniques used. Medications properly checked for expiration dates. SDV (single dose vial) medications used. After the completion of the procedure, all disposable equipment used was discarded in the  proper designated Insurance risk surveyor. Local Anesthesia: Protocol guidelines were followed. The patient was positioned over the fluoroscopy table. The area was prepped in the usual manner. The time-out was completed. The target area was identified using fluoroscopy. A 12-in long, straight, sterile hemostat was used with fluoroscopic guidance to locate the targets for each level blocked. Once located, the skin was marked with an approved surgical skin marker. Once all sites were marked, the skin (epidermis, dermis, and hypodermis), as well as deeper tissues (fat, connective tissue and muscle) were infiltrated with a small amount of a short-acting local anesthetic, loaded on a 10cc syringe with a 25G, 1.5-in  Needle. An appropriate amount of time was allowed for local anesthetics to take effect before proceeding to the next step. Local Anesthetic: Lidocaine 2.0% The unused portion of the local anesthetic was discarded in the proper designated containers. Technical explanation of process:  L2 Medial Branch Nerve Block (MBB): The target area for the L2 medial branch is at the junction of the postero-lateral aspect of the superior articular process and the superior, posterior, and medial edge of the transverse process of L3. Under fluoroscopic guidance, a Quincke needle was inserted until contact was made with os over the superior postero-lateral aspect of the pedicular shadow (target area). After negative aspiration for blood, 0.5 mL of the nerve block solution was injected without difficulty or complication. The needle was removed intact. L3 Medial Branch Nerve Block (MBB): The target area for the L3 medial branch is at  the junction of the postero-lateral aspect of the superior articular process and the superior, posterior, and medial edge of the transverse process of L4. Under fluoroscopic guidance, a Quincke needle was inserted until contact was made with os over the superior postero-lateral aspect of the pedicular shadow (target area). After negative aspiration for blood, 0.5 mL of the nerve block solution was injected without difficulty or complication. The needle was removed intact. L4 Medial Branch Nerve Block (MBB): The target area for the L4 medial branch is at the junction of the postero-lateral aspect of the superior articular process and the superior, posterior, and medial edge of the transverse process of L5. Under fluoroscopic guidance, a Quincke needle was inserted until contact was made with os over the superior postero-lateral aspect of the pedicular shadow (target area). After negative aspiration for blood, 0.5 mL of the nerve block solution was injected without difficulty or complication. The needle was removed intact. L5 Medial Branch Nerve Block (MBB): The target area for the L5 medial branch is at the junction of the postero-lateral aspect of the superior articular process and the superior, posterior, and medial edge of the sacral ala. Under fluoroscopic guidance, a Quincke needle was inserted until contact was made with os over the superior postero-lateral aspect of the pedicular shadow (target area). After negative aspiration for blood, 0.5 mL of the nerve block solution was injected without difficulty or complication. The needle was removed intact. S1 Medial Branch Nerve Block (MBB): The target area for the S1 medial branch is at the posterior and inferior 6 o'clock position of the L5-S1 facet joint. Under fluoroscopic guidance, the Quincke needle inserted for the L5 MBB was redirected until contact was made with os over the inferior and postero aspect of the sacrum, at the 6 o' clock position under the  L5-S1 facet joint (Target area). After negative aspiration for blood, 0.5 mL of the nerve block solution was injected without difficulty or complication. The needle was removed intact.  Nerve  block solution: 0.2% PF-Ropivacaine + Triamcinolone (40 mg/mL) diluted to a final concentration of 4 mg of Triamcinolone/mL of Ropivacaine The unused portion of the solution was discarded in the proper designated containers. Procedural Needles: 22-gauge, 3.5-inch, Quincke needles used for all levels.  Once the entire procedure was completed, the treated area was cleaned, making sure to leave some of the prepping solution back to take advantage of its long term bactericidal properties.   Illustration of the posterior view of the lumbar spine and the posterior neural structures. Laminae of L2 through S1 are labeled. DPRL5, dorsal primary ramus of L5; DPRS1, dorsal primary ramus of S1; DPR3, dorsal primary ramus of L3; FJ, facet (zygapophyseal) joint L3-L4; I, inferior articular process of L4; LB1, lateral branch of dorsal primary ramus of L1; IAB, inferior articular branches from L3 medial branch (supplies L4-L5 facet joint); IBP, intermediate branch plexus; MB3, medial branch of dorsal primary ramus of L3; NR3, third lumbar nerve root; S, superior articular process of L5; SAB, superior articular branches from L4 (supplies L4-5 facet joint also); TP3, transverse process of L3.  Vitals:   02/04/20 1055 02/04/20 1105 02/04/20 1115 02/04/20 1125  BP: 92/70 (!) 92/51 (!) 94/51 (!) 96/52  Pulse:      Resp: 13 16 13 13   Temp:  98.1 F (36.7 C)  98.2 F (36.8 C)  SpO2: 94% 99% 97% 97%  Weight:      Height:         Start Time: 1047 hrs. End Time: 1053 hrs.  Imaging Guidance (Spinal):          Type of Imaging Technique: Fluoroscopy Guidance (Spinal) Indication(s): Assistance in needle guidance and placement for procedures requiring needle placement in or near specific anatomical locations not easily accessible  without such assistance. Exposure Time: Please see nurses notes. Contrast: None used. Fluoroscopic Guidance: I was personally present during the use of fluoroscopy. "Tunnel Vision Technique" used to obtain the best possible view of the target area. Parallax error corrected before commencing the procedure. "Direction-depth-direction" technique used to introduce the needle under continuous pulsed fluoroscopy. Once target was reached, antero-posterior, oblique, and lateral fluoroscopic projection used confirm needle placement in all planes. Images permanently stored in EMR. Interpretation: No contrast injected. I personally interpreted the imaging intraoperatively. Adequate needle placement confirmed in multiple planes. Permanent images saved into the patient's record.  Antibiotic Prophylaxis:   Anti-infectives (From admission, onward)   None     Indication(s): None identified  Post-operative Assessment:  Post-procedure Vital Signs:  Pulse/HCG Rate: (!) 106(!) 108 Temp: 98.2 F (36.8 C) Resp: 13 BP: (!) 96/52 SpO2: 97 %  EBL: None  Complications: No immediate post-treatment complications observed by team, or reported by patient.  Note: The patient tolerated the entire procedure well. A repeat set of vitals were taken after the procedure and the patient was kept under observation following institutional policy, for this type of procedure. Post-procedural neurological assessment was performed, showing return to baseline, prior to discharge. The patient was provided with post-procedure discharge instructions, including a section on how to identify potential problems. Should any problems arise concerning this procedure, the patient was given instructions to immediately contact us, at any time, without hesitation. In any case, we plan to contact the patient by telephone for a follow-up status report regarding this interventional procedure.  Comments:  No additional relevant information.  Plan  of Care  Orders:  Orders Placed This Encounter  Procedures  . LUMBAR FACET(MEDIAL BRANCH NERVE BLOCK) MBNB  Scheduling Instructions:     Procedure: Lumbar facet block (AKA.: Lumbosacral medial branch nerve block)     Side: Right-sided     Level: L3-4, L4-5, & L5-S1 Facets (L2, L3, L4, L5, & S1 Medial Branch Nerves)     Sedation: Patient's choice.     Timeframe: Today    Order Specific Question:   Where will this procedure be performed?    Answer:   [facility]  . [facility] C-ARM 1-60 MIN NO REPORT    Intraoperative interpretation by procedural physician at [place].    Standing Status:   Standing    Number of Occurrences:   1    Order Specific Question:   Reason for exam:    Answer:   Assistance in needle guidance and placement for procedures requiring needle placement in or near specific anatomical locations not easily accessible without such assistance.  . Informed Consent Details: Physician/Practitioner Attestation; Transcribe to consent form and obtain patient signature    Nursing Order: Transcribe to consent form and obtain patient signature. Note: Always confirm laterality of pain with Ms. Dever, before procedure. Procedure: Lumbar Facet Block  under fluoroscopic guidance Indication/Reason: Low Back Pain, with our without leg pain, due to Facet Joint Arthralgia (Joint Pain) known as Lumbar Facet Syndrome, secondary to Lumbar, and/or Lumbosacral Spondylosis (Arthritis of the Spine), without myelopathy or radiculopathy (Nerve Damage). Provider Attestation: I, Rockwood Dossie Arbour, MD, (Pain Management Specialist), the physician/practitioner, attest that I have discussed with the patient the benefits, risks, side effects, alternatives, likelihood of achieving goals and potential problems during recovery for the procedure that I have provided informed consent.  . Care order/instruction: Please confirm that the patient has stopped the Eliquis (Apixaban) x 3  days prior to procedure or surgery.    Please confirm that the patient has stopped the Eliquis (Apixaban) x 3 days prior to procedure or surgery.    Standing Status:   Standing    Number of Occurrences:   1  . Provide equipment / supplies at bedside    Equipment required: Single use, disposable, "Block Tray"    Standing Status:   Standing    Number of Occurrences:   1    Order Specific Question:   Specify    Answer:   Block Tray  . Bleeding precautions    Standing Status:   Standing    Number of Occurrences:   1  . Latex precautions    Activate Latex-Free Protocol.    Standing Status:   Standing    Number of Occurrences:   1   Chronic Opioid Analgesic:  Tramadol 50 mg, 1 tablet PO q 6 hrs (200 mg/day of tramadol) MME/day: 20 mg/day.   Medications ordered for procedure: Meds ordered this encounter  Medications  . lidocaine (XYLOCAINE) 2 % (with pres) injection 400 mg  . lactated ringers infusion 1,000 mL  . midazolam (VERSED) 5 MG/5ML injection 1-2 mg    Make sure Flumazenil is available in the pyxis when using this medication. If oversedation occurs, administer 0.2 mg IV over 15 sec. If after 45 sec no response, administer 0.2 mg again over 1 min; may repeat at 1 min intervals; not to exceed 4 doses (1 mg)  . fentaNYL (SUBLIMAZE) injection 25-50 mcg    Make sure Narcan is available in the pyxis when using this medication. In the event of respiratory depression (RR< 8/min): Titrate NARCAN (naloxone) in increments of 0.1 to 0.2 mg IV at 2-3  minute intervals, until desired degree of reversal.  . ropivacaine (PF) 2 mg/mL (0.2%) (NAROPIN) injection 9 mL  . triamcinolone acetonide (KENALOG-40) injection 40 mg   Medications administered: We administered lidocaine, lactated ringers, midazolam, fentaNYL, ropivacaine (PF) 2 mg/mL (0.2%), and triamcinolone acetonide.  See the medical record for exact dosing, route, and time of administration.  Follow-up plan:   Return for F2F encounter,  PP-(on procedure day).       Considering: NOTE:Stop Eliquisfor 3 daysprior to aprocedure.    Palliative PRN treatment(s): Palliative/therapeutic caudal ESI #2 (100/100/90/90) Palliative/Therapeuticright lumbar facet RFA #3(last done 03/27/2018) (100/100/100/90) (100/90/0) Palliative/therapeutic right-sidedSIjoint RFA#1 (last done 03/27/2018) (100/90/0) Palliative right lumbar facet block#4(100/100/90/75) Palliative right sacroiliac joint block#4     Recent Visits Date Type Provider Dept  01/27/20 Telemedicine Milinda Pointer, MD [facility]  Showing recent visits within past 90 days and meeting all other requirements Today's Visits Date Type Provider Dept  02/04/20 Procedure visit Milinda Pointer, MD [facility]  Showing today's visits and meeting all other requirements Future Appointments Date Type Provider Dept  02/18/20 Appointment Milinda Pointer, MD [facility]  03/23/20 Appointment Milinda Pointer, MD [facility]  Showing future appointments within next 90 days and meeting all other requirements  Disposition: Discharge home  Discharge (Date  Time): 02/04/2020; 1126 hrs.   Primary Care Physician: [name], MD Location: [facility] Note by: Gaspar Cola, MD Date: 02/04/2020; Time: 12:16 PM  Disclaimer:  Medicine is not an Chief Strategy Officer. The only guarantee in medicine is that nothing is guaranteed. It is important to note that the decision to proceed with this intervention was based on the information collected from the patient. The Data and conclusions were drawn from the patient's questionnaire, the interview, and the physical examination. Because the information was provided in large part by the patient, it cannot be guaranteed that it has not been purposely or unconsciously manipulated. Every effort has been made to obtain as much relevant data as possible for this  evaluation. It is important to note that the conclusions that lead to this procedure are derived in large part from the available data. Always take into account that the treatment will also be dependent on availability of resources and existing treatment guidelines, considered by other Pain Management Practitioners as being common knowledge and practice, at the time of the intervention. For Medico-Legal purposes, it is also important to point out that variation in procedural techniques and pharmacological choices are the acceptable norm. The indications, contraindications, technique, and results of the above procedure should only be interpreted and judged by a Board-Certified Interventional Pain Specialist with extensive familiarity and expertise in the same exact procedure and technique.

## 2020-02-04 NOTE — Progress Notes (Signed)
Safety precautions to be maintained throughout the outpatient stay will include: orient to surroundings, keep bed in low position, maintain call bell within reach at all times, provide assistance with transfer out of bed and ambulation.  

## 2020-02-05 ENCOUNTER — Telehealth: Payer: Self-pay

## 2020-02-05 NOTE — Telephone Encounter (Signed)
Post procedure phone call.  Patient states she is doing good this morning.

## 2020-02-18 ENCOUNTER — Ambulatory Visit: Payer: Medicare Other | Admitting: Pain Medicine

## 2020-02-20 ENCOUNTER — Other Ambulatory Visit: Payer: Self-pay | Admitting: Family Medicine

## 2020-02-22 ENCOUNTER — Encounter: Payer: Self-pay | Admitting: Pain Medicine

## 2020-02-23 ENCOUNTER — Other Ambulatory Visit: Payer: Self-pay

## 2020-02-23 ENCOUNTER — Telehealth: Payer: Self-pay | Admitting: Family Medicine

## 2020-02-23 ENCOUNTER — Ambulatory Visit: Payer: Medicare Other | Admitting: Pain Medicine

## 2020-02-23 ENCOUNTER — Ambulatory Visit: Payer: Medicare Other | Attending: Pain Medicine | Admitting: Pain Medicine

## 2020-02-23 DIAGNOSIS — G8929 Other chronic pain: Secondary | ICD-10-CM

## 2020-02-23 DIAGNOSIS — G894 Chronic pain syndrome: Secondary | ICD-10-CM

## 2020-02-23 DIAGNOSIS — M545 Low back pain: Secondary | ICD-10-CM | POA: Diagnosis not present

## 2020-02-23 MED ORDER — TRAMADOL HCL 50 MG PO TABS: 50.0000 mg | ORAL_TABLET | Freq: Four times a day (QID) | ORAL | 2 refills | Status: DC

## 2020-02-23 NOTE — Telephone Encounter (Signed)
Pt states that she has pain and pressure in her sinuses. She states that she has a sinus infection and wants medication. Did not want to make an appt or get tested for Covid. Asked me to send message back. Please advise

## 2020-02-23 NOTE — Progress Notes (Signed)
Patient: Amanda Soto  Service Category: E/M  Provider: Gaspar Cola, MD  DOB: [birth year]  DOS: 02/23/2020  Location: Office  MRN: [id number]  Setting: Ambulatory outpatient  Referring Provider: [name], MD  Type: Established Patient  Specialty: Interventional Pain Management  PCP: [name], MD  Location: Remote location  Delivery: TeleHealth     Virtual Encounter - Pain Management PROVIDER NOTE: Information contained herein reflects review and annotations entered in association with encounter. Interpretation of such information and data should be left to medically-trained personnel. Information provided to patient can be located elsewhere in the medical record under "Patient Instructions". Document created using STT-dictation technology, any transcriptional errors that may result from process are unintentional.    Contact & Pharmacy Preferred: [place]: [phone] (home) Mobile: [phone] (mobile) E-mail: [name]  [id number] [place] [id number] [id number]: [phone]: [phone]  Pre-screening  Amanda Soto offered "in-person" vs "virtual" encounter. She indicated preferring virtual for this encounter.   Reason COVID-19*  Social distancing based on CDC and AMA recommendations.   I contacted PSHANIGUA GIBBon 02/23/2020 via telephone.      I clearly identified myself as FGaspar Cola MD. I verified that I was speaking with the correct person using two identifiers (Name: PHALEI HANOVER and date of birth: 8July 24, 1926.  Consent I sought verbal advanced consent from PKatheren Pullerfor virtual visit interactions. I informed Ms. Waynick of possible security and privacy concerns, risks, and limitations associated with providing "not-in-person" medical evaluation and management services. I also informed Ms. Mathisen of the availability of "in-person" appointments. Finally, I informed  her that there would be a charge for the virtual visit and that she could be  personally, fully or partially, financially responsible for it. Ms. HGironexpressed understanding and agreed to proceed.   Historic Elements   Ms. PJALANI CULLIFERis a 86y.o. year old, female patient evaluated today after her last contact with our practice on 02/05/2020. [name] has a past medical history of Acute postoperative pain (12/17/2016), Anemia (11/10/2015), Arthritis, [place], [place]F [place]estive heart failure[place], Chickenpox, Chronic abdominal pain (01/16/2014), Closed Colles' fracture (12/17/2016), Degenerative arthritis of hip (08/19/2014), Degenerative arthritis of lumbar spine (11/18/2013), Depression, Diverticulitis, GERD (gastroesophageal reflux disease), Heart murmur, Heart rate slow, Hyperlipidemia, Hypertension, Hypothyroid, Neuritis or radiculitis due to rupture of lumbar intervertebral disc (11/18/2013), Skin cancer (2016 ?), Symptomatic anemia (11/09/2015), and Trochanteric bursitis of right hip (11/18/2013). She also  has a past surgical history that includes Cataract extraction; Partial hysterectomy; Appendectomy; and Tonsillectomy. Ms. HMordanhas a current medication list which includes the following prescription(s): acetaminophen, apixaban, vitamin d3, fluticasone, furosemide, multiple vitamins-minerals, mupirocin ointment, omeprazole, sotalol, synthroid, [START ON 03/29/2020] tramadol, triamcinolone cream, and vitamin b-12. She  reports that she has never smoked. She has never used smokeless tobacco. She reports current alcohol use of about 1.0 standard drink of alcohol per week. She reports that she does not use drugs. Ms. HWinkelsis allergic to hydrocodone, ciprofloxacin, and latex.   HPI  Today, she is being contacted for both, medication management and a post-procedure assessment.  The patient indicates doing well with the current medication regimen. No adverse reactions or side effects reported to  the medications.  The patient was extremely happy with the results of the right lumbar facet block.  The only thing that she was complaining off today was of a  sinus infection which I recommended that she call her PCP for that.  Post-Procedure Evaluation  Procedure (02/04/2020): Therapeutic right lumbar facet block #4 under fluoroscopic guidance and IV sedation Pre-procedure pain level: 10/10 Post-procedure: 0/10 (100% relief)  Sedation: Sedation provided.  Effectiveness during initial hour after procedure(Ultra-Short Term Relief): 100 %.  Local anesthetic used: Long-acting (4-6 hours) Effectiveness: Defined as any analgesic benefit obtained secondary to the administration of local anesthetics. This carries significant diagnostic value as to the etiological location, or anatomical origin, of the pain. Duration of benefit is expected to coincide with the duration of the local anesthetic used.  Effectiveness during initial 4-6 hours after procedure(Short-Term Relief): 100 %.  Long-term benefit: Defined as any relief past the pharmacologic duration of the local anesthetics.  Effectiveness past the initial 6 hours after procedure(Long-Term Relief): 80 %.  Current benefits: Defined as benefit that persist at this time.   Analgesia:  >75% relief Function: Ms. Jain reports improvement in function ROM: Ms. Bartl reports improvement in ROM  Pharmacotherapy Assessment  Analgesic: Tramadol 50 mg, 1 tablet PO q 6 hrs (200 mg/day of tramadol) MME/day: 20 mg/day.   Monitoring: [place] PMP: PDMP reviewed during this encounter.       Pharmacotherapy: No side-effects or adverse reactions reported. Compliance: No problems identified. Effectiveness: Clinically acceptable. Plan: Refer to "POC".  UDS: No results found for: SUMMARY  Laboratory Chemistry Profile   Renal Lab Results  Component Value Date   BUN 16 01/11/2020   CREATININE 0.55 01/11/2020   LABCREA 90 01/11/2020   BCR 28 03/30/2019   GFR  103.17 01/11/2020   GFRAA >60 12/18/2019   GFRNONAA >60 12/18/2019     Hepatic Lab Results  Component Value Date   AST 22 12/18/2019   ALT 15 12/18/2019   ALBUMIN 4.2 12/18/2019   ALKPHOS 56 12/18/2019   LIPASE 181 11/02/2013     Electrolytes Lab Results  Component Value Date   NA 134 (L) 01/11/2020   K 3.9 01/11/2020   CL 96 01/11/2020   CALCIUM 9.5 01/11/2020   MG 2.0 03/30/2019     Bone Lab Results  Component Value Date   VD25OH 28.73 (L) 01/11/2020   25OHVITD1 14 (L) 03/30/2019   25OHVITD2 <1.0 03/30/2019   25OHVITD3 14 03/30/2019     Inflammation (CRP: Acute Phase) (ESR: Chronic Phase) Lab Results  Component Value Date   CRP <1 03/30/2019   ESRSEDRATE 50 (H) 03/30/2019       Note: Above Lab results reviewed.   Imaging  DG PAIN CLINIC C-ARM 1-60 MIN NO REPORT Fluoro was used, but no Radiologist interpretation will be provided.  Please refer to "NOTES" tab for provider progress note.  Assessment  The primary encounter diagnosis was Chronic pain syndrome. A diagnosis of Chronic low back pain (Primary Area of Pain) (Right) was also pertinent to this visit.  Plan of Care  Problem-specific:  No problem-specific Assessment & Plan notes found for this encounter.  Ms. TYSHAWN KEEL has a current medication list which includes the following long-term medication(s): apixaban, fluticasone, furosemide, sotalol, synthroid, and [START ON 03/29/2020] tramadol.  Pharmacotherapy (Medications Ordered): Meds ordered this encounter  Medications  . traMADol (ULTRAM) 50 MG tablet    Sig: Take 1 tablet (50 mg total) by mouth 4 (four) times daily.    Dispense:  120 tablet    Refill:  2    Chronic Pain: STOP Act (Not applicable) Fill 1 day early if closed on refill date. Do not  fill until: 03/29/2020. To last until: 06/27/2020. Avoid benzodiazepines within 8 hours of opioids   Orders:  No orders of the defined types were placed in this encounter.  Follow-up plan:    Return in about 4 months (around 06/27/2020) for (20-min), (F2F), (Med Mgmt).      Considering: NOTE:Stop Eliquisfor 3 daysprior to aprocedure.    Palliative PRN treatment(s): Palliative/therapeutic caudal ESI #2 (100/100/90/90) Palliative/Therapeuticright lumbar facet RFA #3(last done 03/27/2018) (100/100/100/90) (100/90/0) Palliative/therapeutic right-sidedSIjoint RFA#1 (last done 03/27/2018) (100/90/0) Palliative right lumbar facet block#4(100/100/90/75) Palliative right sacroiliac joint block#4    Recent Visits Date Type Provider Dept  02/04/20 Procedure visit Milinda Pointer, MD [facility]  01/27/20 Telemedicine Milinda Pointer, MD [facility]  Showing recent visits within past 90 days and meeting all other requirements Today's Visits Date Type Provider Dept  02/23/20 Telemedicine Milinda Pointer, MD [facility]  Showing today's visits and meeting all other requirements Future Appointments Date Type Provider Dept  03/10/20 Appointment Milinda Pointer, MD [facility]  Showing future appointments within next 90 days and meeting all other requirements  I discussed the assessment and treatment plan with the patient. The patient was provided an opportunity to ask questions and all were answered. The patient agreed with the plan and demonstrated an understanding of the instructions.  Patient advised to call back or seek an in-person evaluation if the symptoms or condition worsens.  Duration of encounter: 15 minutes.  Note by: Gaspar Cola, MD Date: 02/23/2020; Time: 4:42 PM

## 2020-02-24 NOTE — Telephone Encounter (Signed)
Pain and pressure in the sinuses does not necessarily indicate a bacterial sinus infection and she really should complete a virtual visit to determine if antibiotics are appropriate.  With those symptoms I would recommend that she get tested for Covid as we have been seeing an increase in patients with Covid and an increase in patients who have been vaccinated that have gotten Covid recently.

## 2020-02-24 NOTE — Telephone Encounter (Signed)
Please advise 

## 2020-02-24 NOTE — Telephone Encounter (Signed)
Called and informed the Patient of the below. She went and had a Covid test this morning. She has been scheduled for a virtual visit 11:30 am on 02/26/20.

## 2020-02-26 ENCOUNTER — Telehealth (INDEPENDENT_AMBULATORY_CARE_PROVIDER_SITE_OTHER): Payer: Medicare Other | Admitting: Family Medicine

## 2020-02-26 ENCOUNTER — Other Ambulatory Visit: Payer: Self-pay

## 2020-02-26 ENCOUNTER — Encounter: Payer: Self-pay | Admitting: Family Medicine

## 2020-02-26 DIAGNOSIS — M810 Age-related osteoporosis without current pathological fracture: Secondary | ICD-10-CM | POA: Diagnosis not present

## 2020-02-26 DIAGNOSIS — J0100 Acute maxillary sinusitis, unspecified: Secondary | ICD-10-CM

## 2020-02-26 MED ORDER — AMOXICILLIN-POT CLAVULANATE 875-125 MG PO TABS: 1.0000 | ORAL_TABLET | Freq: Two times a day (BID) | ORAL | 0 refills | Status: DC

## 2020-02-26 NOTE — Progress Notes (Signed)
Virtual Visit via telephone Note  This visit type was conducted due to national recommendations for restrictions regarding the COVID-19 pandemic (e.g. social distancing).  This format is felt to be most appropriate for this patient at this time.  All issues noted in this document were discussed and addressed.  No physical exam was performed (except for noted visual exam findings with Video Visits).   I connected with Amanda Soto today at 11:30 AM EDT by telephone and verified that I am speaking with the correct person using two identifiers. Location patient: home Location provider: work  Persons participating in the virtual visit: patient, provider  I discussed the limitations, risks, security and privacy concerns of performing an evaluation and management service by telephone and the availability of in person appointments. I also discussed with the patient that there may be a patient responsible charge related to this service. The patient expressed understanding and agreed to proceed.  Interactive audio and video telecommunications were attempted between this provider and patient, however failed, due to patient having technical difficulties OR patient did not have access to video capability.  We continued and completed visit with audio only.   Reason for visit: Same-day visit.  HPI: Sinus infection: Patient notes left-sided sinus pressure and congestion that is been going on for a week.  She has had a negative Covid test per her report.  No cough.  No shortness of breath.  No taste or smell disturbances.  She has had postnasal drip.  She felt possibly feverish last night as she got a little sweaty.  She is not been taking any medications.  No COVID-19 exposures.  She has been vaccinated.  Osteoporosis: Patient notes it feels as though her appetites been decreased since she started on Evenity.  Though she also notes that her appetite typically goes down when she has a sinus infection.   ROS:  See pertinent positives and negatives per HPI.  Past Medical History:  Diagnosis Date  . Acute postoperative pain 12/17/2016  . Anemia 11/10/2015  . Arthritis   . Atrial fibrillation ([place])   . CHF (congestive heart failure) ([place])   . Chickenpox   . Chronic abdominal pain 01/16/2014  . Closed Colles' fracture 12/17/2016  . Degenerative arthritis of hip 08/19/2014  . Degenerative arthritis of lumbar spine 11/18/2013  . Depression   . Diverticulitis   . GERD (gastroesophageal reflux disease)   . Heart murmur   . Heart rate slow   . Hyperlipidemia   . Hypertension   . Hypothyroid   . Neuritis or radiculitis due to rupture of lumbar intervertebral disc 11/18/2013  . Skin cancer 2016 ?   left lower leg  . Symptomatic anemia 11/09/2015  . Trochanteric bursitis of right hip 11/18/2013    Past Surgical History:  Procedure Laterality Date  . APPENDECTOMY    . CATARACT EXTRACTION    . PARTIAL HYSTERECTOMY    . TONSILLECTOMY      Family History  Problem Relation Age of Onset  . Stroke Maternal Grandmother   . Breast cancer Mother   . Breast cancer Sister   . Stroke Sister   . Multiple sclerosis Sister   . Heart disease Other        Parent, grandparent, sons  . Hypertension Other        Parent, grandparent    SOCIAL HX: Non-smoker   Current Outpatient Medications:  .  acetaminophen (TYLENOL) 325 MG tablet, Take 500 mg by mouth every 6 (six) hours  as needed for moderate pain, fever or headache. , Disp: , Rfl:  .  apixaban (ELIQUIS) 2.5 MG TABS tablet, Take 1 tablet (2.5 mg total) by mouth 2 (two) times daily., Disp: 60 tablet, Rfl: 11 .  Cholecalciferol (VITAMIN D3) 50 MCG (2000 UT) CHEW, Chew 1 tablet by mouth daily., Disp: , Rfl:  .  fluticasone (FLONASE) 50 MCG/ACT nasal spray, SPRAY 2 SPRAYS INTO EACH NOSTRIL EVERY DAY, Disp: , Rfl:  .  furosemide (LASIX) 20 MG tablet, TAKE 1 TABLET BY MOUTH EVERY DAY, Disp: 90 tablet, Rfl: 1 .  Multiple Vitamins-Minerals (PRESERVISION AREDS 2  PO), Take by mouth in the morning and at bedtime., Disp: , Rfl:  .  mupirocin ointment (BACTROBAN) 2 %, Apply to affected area on right lower leg 1-2 times daily and cover., Disp: 22 g, Rfl: 1 .  omeprazole (PRILOSEC) 40 MG capsule, TAKE 1 CAPSULE BY MOUTH EVERY DAY 15-20 MINUTES BEFORE MEALS, Disp: , Rfl:  .  sotalol (BETAPACE) 80 MG tablet, Take 40 mg by mouth 2 (two) times daily. , Disp: , Rfl:  .  SYNTHROID 50 MCG tablet, TAKE 1 TABLET BY MOUTH EVERY DAY BEFORE BREAKFAST, Disp: 90 tablet, Rfl: 1 .  [START ON 03/29/2020] traMADol (ULTRAM) 50 MG tablet, Take 1 tablet (50 mg total) by mouth 4 (four) times daily., Disp: 120 tablet, Rfl: 2 .  triamcinolone cream (KENALOG) 0.1 %, Apply 1 application topically 2 (two) times daily., Disp: 30 g, Rfl: 0 .  vitamin B-12 (CYANOCOBALAMIN) 1000 MCG tablet, Take by mouth., Disp: , Rfl:  .  amoxicillin-clavulanate (AUGMENTIN) 875-125 MG tablet, Take 1 tablet by mouth 2 (two) times daily., Disp: 14 tablet, Rfl: 0  EXAM: This was a telehealth telephone visit and thus no physical exam was completed  ASSESSMENT AND PLAN:  Discussed the following assessment and plan:  Sinusitis Symptoms are concerning for sinusitis.  We will treat with Augmentin.  She will monitor for worsening symptoms and seek medical attention for shortness of breath or elevated fevers.  OP (osteoporosis) Discussed that decreased appetite was not listed as a side effect for Evenity.  Encouraged her to continue with this medication and see if her appetite improves with treatment of her sinus infection.   No orders of the defined types were placed in this encounter.   Meds ordered this encounter  Medications  . amoxicillin-clavulanate (AUGMENTIN) 875-125 MG tablet    Sig: Take 1 tablet by mouth 2 (two) times daily.    Dispense:  14 tablet    Refill:  0     I discussed the assessment and treatment plan with the patient. The patient was provided an opportunity to ask questions and  all were answered. The patient agreed with the plan and demonstrated an understanding of the instructions.   The patient was advised to call back or seek an in-person evaluation if the symptoms worsen or if the condition fails to improve as anticipated.  I provided 7 minutes of non-face-to-face time during this encounter.   [name], MD

## 2020-02-26 NOTE — Assessment & Plan Note (Signed)
Discussed that decreased appetite was not listed as a side effect for Evenity.  Encouraged her to continue with this medication and see if her appetite improves with treatment of her sinus infection.

## 2020-02-26 NOTE — Assessment & Plan Note (Signed)
Symptoms are concerning for sinusitis.  We will treat with Augmentin.  She will monitor for worsening symptoms and seek medical attention for shortness of breath or elevated fevers.

## 2020-02-29 ENCOUNTER — Other Ambulatory Visit: Payer: Self-pay

## 2020-02-29 ENCOUNTER — Ambulatory Visit: Payer: Medicare Other | Admitting: Dermatology

## 2020-02-29 DIAGNOSIS — L819 Disorder of pigmentation, unspecified: Secondary | ICD-10-CM

## 2020-02-29 DIAGNOSIS — L0100 Impetigo, unspecified: Secondary | ICD-10-CM

## 2020-02-29 DIAGNOSIS — D692 Other nonthrombocytopenic purpura: Secondary | ICD-10-CM

## 2020-02-29 NOTE — Progress Notes (Signed)
   Follow-Up Visit   Subjective  Amanda Soto is a 86 y.o. female who presents for the following: Impetigo vs ISK with stasis changes (R medial lower leg, 60m f/u, mupirocin oint qd, gold bond rough and bumpy).  She thinks she is better.   The following portions of the chart were reviewed this encounter and updated as appropriate:      Review of Systems:  No other skin or systemic complaints except as noted in HPI or Assessment and Plan.  Objective  Well appearing patient in no apparent distress; mood and affect are within normal limits.  A focused examination was performed including R medial lower leg. Relevant physical exam findings are noted in the Assessment and Plan.  Objective  Right lower leg: Violaceous and dyspigmented patches  Objective  R medial lower leg: Clear- smooth violaceous patch, no crusting   Assessment & Plan    Purpura - Violaceous macules and patches - Benign - Related to age, sun damage and/or use of blood thinners - Observe - Can use OTC arnica containing moisturizer such as Dermend Bruise Formula if desired - Call for worsening or other concerns   Post-inflammatory pigmentary changes Right lower leg  Due to stasis changes/purpura  Benign, observe  Cont Gold Bond Rough and Bumpy or Cerave SA or Amlactin Cream, samples given  Impetigo R medial lower leg  Vs ISK- resolved, observe  May d/c Mupirocin ointment   Other Related Medications mupirocin ointment (BACTROBAN) 2 %  Return if symptoms worsen or fail to improve.  I, Othelia Pulling, RMA, am acting as scribe for Brendolyn Patty, MD . Documentation: I have reviewed the above documentation for accuracy and completeness, and I agree with the above.  Brendolyn Patty MD

## 2020-03-01 ENCOUNTER — Other Ambulatory Visit: Payer: Self-pay

## 2020-03-01 ENCOUNTER — Ambulatory Visit (INDEPENDENT_AMBULATORY_CARE_PROVIDER_SITE_OTHER): Payer: Medicare Other

## 2020-03-01 DIAGNOSIS — M810 Age-related osteoporosis without current pathological fracture: Secondary | ICD-10-CM

## 2020-03-01 MED ORDER — ROMOSOZUMAB-AQQG 105 MG/1.17ML ~~LOC~~ SOSY
210.0000 mg | PREFILLED_SYRINGE | Freq: Once | SUBCUTANEOUS | Status: AC
  Administered 2020-03-01: 210 mg via SUBCUTANEOUS

## 2020-03-01 NOTE — Progress Notes (Signed)
Patient presented for monthly Evenity injection SQ to right and left arm. Patient tolerated well.

## 2020-03-02 ENCOUNTER — Other Ambulatory Visit: Payer: Self-pay | Admitting: Family Medicine

## 2020-03-02 ENCOUNTER — Ambulatory Visit: Payer: Medicare Other | Admitting: Dermatology

## 2020-03-10 ENCOUNTER — Telehealth: Payer: Medicare Other | Admitting: Pain Medicine

## 2020-03-11 ENCOUNTER — Telehealth: Payer: Self-pay | Admitting: Family Medicine

## 2020-03-11 NOTE — Telephone Encounter (Signed)
I spoke with the patient and she stated that she thinks she is having side effects from the Evinity injections, she is having h/a and does not feel like herself, at all she states she cannot think straight. Nothing urgent   Amanda Soto,cma

## 2020-03-11 NOTE — Telephone Encounter (Signed)
Patient is taking the evenity injection, She would like to know the side effects.

## 2020-03-15 NOTE — Telephone Encounter (Signed)
Noted.  The Evenity could cause headaches as it is a listed side effect though her other complaint of not being able to think correctly is not listed as a possible side effect.  She should be evaluated to determine if these issues are coming from the [place] or some other cause.  Thanks.

## 2020-03-15 NOTE — Telephone Encounter (Signed)
Called and spoke with the patient and scheduled a appointment to be evaluated for side effects on Evenity injections.  Amanda Soto,cma

## 2020-03-17 ENCOUNTER — Other Ambulatory Visit: Payer: Self-pay

## 2020-03-17 ENCOUNTER — Ambulatory Visit: Payer: Medicare Other | Admitting: Podiatry

## 2020-03-17 ENCOUNTER — Encounter: Payer: Self-pay | Admitting: Podiatry

## 2020-03-17 DIAGNOSIS — D689 Coagulation defect, unspecified: Secondary | ICD-10-CM

## 2020-03-17 DIAGNOSIS — M79676 Pain in unspecified toe(s): Secondary | ICD-10-CM

## 2020-03-17 DIAGNOSIS — L608 Other nail disorders: Secondary | ICD-10-CM | POA: Diagnosis not present

## 2020-03-17 DIAGNOSIS — B351 Tinea unguium: Secondary | ICD-10-CM | POA: Diagnosis not present

## 2020-03-17 NOTE — Progress Notes (Signed)
This patient returns to my office for at risk foot care.  This patient requires this care by a professional since this patient will be at risk due to having  Coagulation defect.  Patient is taking eliquiss.  This patient is unable to cut nails herself since the patient cannot reach her  nails.These nails are painful walking and wearing shoes.  This patient presents for at risk foot care today. She presents to the office with her niece.  General Appearance  Alert, conversant and in no acute stress.  Vascular  Dorsalis pedis and posterior tibial  pulses are palpable  bilaterally.  Capillary return is within normal limits  bilaterally. Temperature is within normal limits  bilaterally.  Neurologic  Senn-Weinstein monofilament wire test within normal limits  bilaterally. Muscle power within normal limits bilaterally.  Nails Thick disfigured discolored nails with subungual debris  from hallux to fifth toes bilaterally. No evidence of bacterial infection or drainage bilaterally. Pincer nails.  Orthopedic  No limitations of motion  feet .  No crepitus or effusions noted.  No bony pathology or digital deformities noted.Hammer toes second.  Skin  normotropic skin with no porokeratosis noted bilaterally.  No signs of infections or ulcers noted.     Onychomycosis  Pain in right toes  Pain in left toes  Consent was obtained for treatment procedures.   Mechanical debridement of nails 1-5  bilaterally performed with a nail nipper.  Filed with dremel without incident. No infection or ulcer.     Return office visit 3 months         Told patient to return for periodic foot care and evaluation due to potential at risk complications.   Gardiner Barefoot DPM

## 2020-03-23 ENCOUNTER — Telehealth: Payer: Medicare Other | Admitting: Pain Medicine

## 2020-03-23 ENCOUNTER — Encounter: Payer: Medicare Other | Admitting: Pain Medicine

## 2020-03-25 DIAGNOSIS — H353132 Nonexudative age-related macular degeneration, bilateral, intermediate dry stage: Secondary | ICD-10-CM | POA: Diagnosis not present

## 2020-03-25 DIAGNOSIS — H02052 Trichiasis without entropian right lower eyelid: Secondary | ICD-10-CM | POA: Diagnosis not present

## 2020-03-30 ENCOUNTER — Telehealth: Payer: Medicare Other | Admitting: Family Medicine

## 2020-03-30 ENCOUNTER — Other Ambulatory Visit: Payer: Self-pay

## 2020-03-31 ENCOUNTER — Ambulatory Visit: Payer: Medicare Other

## 2020-04-04 ENCOUNTER — Other Ambulatory Visit: Payer: Self-pay | Admitting: Family Medicine

## 2020-04-05 ENCOUNTER — Other Ambulatory Visit: Payer: Self-pay

## 2020-04-05 ENCOUNTER — Telehealth (INDEPENDENT_AMBULATORY_CARE_PROVIDER_SITE_OTHER): Payer: Medicare Other | Admitting: Family Medicine

## 2020-04-05 ENCOUNTER — Encounter: Payer: Self-pay | Admitting: Family Medicine

## 2020-04-05 VITALS — Ht 59.0 in | Wt 118.0 lb

## 2020-04-05 DIAGNOSIS — M810 Age-related osteoporosis without current pathological fracture: Secondary | ICD-10-CM

## 2020-04-05 DIAGNOSIS — R5383 Other fatigue: Secondary | ICD-10-CM | POA: Diagnosis not present

## 2020-04-05 DIAGNOSIS — M545 Low back pain, unspecified: Secondary | ICD-10-CM | POA: Diagnosis not present

## 2020-04-05 NOTE — Progress Notes (Signed)
Virtual Visit via telephone Note  This visit type was conducted due to national recommendations for restrictions regarding the COVID-19 pandemic (e.g. social distancing).  This format is felt to be most appropriate for this patient at this time.  All issues noted in this document were discussed and addressed.  No physical exam was performed (except for noted visual exam findings with Video Visits).   I connected with Amanda Soto today at  3:15 PM EDT by telephone and verified that I am speaking with the correct person using two identifiers. Location patient: home Location provider: home office Persons participating in the virtual visit: patient, provider  I discussed the limitations, risks, security and privacy concerns of performing an evaluation and management service by telephone and the availability of in person appointments. I also discussed with the patient that there may be a patient responsible charge related to this service. The patient expressed understanding and agreed to proceed.  Interactive audio and video telecommunications were attempted between this provider and patient, however failed, due to patient having technical difficulties OR patient did not have access to video capability.  We continued and completed visit with audio only.   Reason for visit: Follow-up  HPI: Osteoporosis: Patient has been on Evenity.  Since starting on this she has had decreased energy and arthralgias particularly in her knees, ankles, and back.  Notes she has trouble straightening up due to stiffness and pain in her lower back.  The patient wonders if she should get the COVID-19 booster.   ROS: See pertinent positives and negatives per HPI.  Past Medical History:  Diagnosis Date  . Acute postoperative pain 12/17/2016  . Anemia 11/10/2015  . Arthritis   . Atrial fibrillation ([place])   . CHF (congestive heart failure) ([place])   . Chickenpox   . Chronic abdominal pain 01/16/2014  . Closed Colles'  fracture 12/17/2016  . Degenerative arthritis of hip 08/19/2014  . Degenerative arthritis of lumbar spine 11/18/2013  . Depression   . Diverticulitis   . GERD (gastroesophageal reflux disease)   . Heart murmur   . Heart rate slow   . Hyperlipidemia   . Hypertension   . Hypothyroid   . Neuritis or radiculitis due to rupture of lumbar intervertebral disc 11/18/2013  . Skin cancer 2016 ?   left lower leg  . Symptomatic anemia 11/09/2015  . Trochanteric bursitis of right hip 11/18/2013    Past Surgical History:  Procedure Laterality Date  . APPENDECTOMY    . CATARACT EXTRACTION    . PARTIAL HYSTERECTOMY    . TONSILLECTOMY      Family History  Problem Relation Age of Onset  . Stroke Maternal Grandmother   . Breast cancer Mother   . Breast cancer Sister   . Stroke Sister   . Multiple sclerosis Sister   . Heart disease Other        Parent, grandparent, sons  . Hypertension Other        Parent, grandparent    SOCIAL HX: Non-smoker   Current Outpatient Medications:  .  acetaminophen (TYLENOL) 325 MG tablet, Take 500 mg by mouth every 6 (six) hours as needed for moderate pain, fever or headache. , Disp: , Rfl:  .  amoxicillin-clavulanate (AUGMENTIN) 875-125 MG tablet, Take 1 tablet by mouth 2 (two) times daily., Disp: 14 tablet, Rfl: 0 .  Cholecalciferol (VITAMIN D3) 50 MCG (2000 UT) CHEW, Chew 1 tablet by mouth daily., Disp: , Rfl:  .  ELIQUIS 2.5 MG TABS  tablet, TAKE 1 TABLET BY MOUTH TWICE A DAY, Disp: 60 tablet, Rfl: 11 .  fluticasone (FLONASE) 50 MCG/ACT nasal spray, SPRAY 2 SPRAYS INTO EACH NOSTRIL EVERY DAY, Disp: , Rfl:  .  furosemide (LASIX) 20 MG tablet, TAKE 1 TABLET BY MOUTH EVERY DAY, Disp: 90 tablet, Rfl: 1 .  Multiple Vitamins-Minerals (PRESERVISION AREDS 2 PO), Take by mouth in the morning and at bedtime., Disp: , Rfl:  .  mupirocin ointment (BACTROBAN) 2 %, Apply to affected area on right lower leg 1-2 times daily and cover., Disp: 22 g, Rfl: 1 .  omeprazole  (PRILOSEC) 40 MG capsule, TAKE 1 CAPSULE BY MOUTH EVERY DAY 15-20 MINUTES BEFORE MEALS, Disp: , Rfl:  .  sotalol (BETAPACE) 80 MG tablet, Take 40 mg by mouth 2 (two) times daily. , Disp: , Rfl:  .  SYNTHROID 50 MCG tablet, TAKE 1 TABLET BY MOUTH EVERY DAY BEFORE BREAKFAST, Disp: 90 tablet, Rfl: 1 .  traMADol (ULTRAM) 50 MG tablet, Take 1 tablet (50 mg total) by mouth 4 (four) times daily., Disp: 120 tablet, Rfl: 2 .  triamcinolone cream (KENALOG) 0.1 %, Apply 1 application topically 2 (two) times daily., Disp: 30 g, Rfl: 0 .  vitamin B-12 (CYANOCOBALAMIN) 1000 MCG tablet, Take by mouth., Disp: , Rfl:   EXAM: This is a telephone visit and thus no physical exam was completed.  ASSESSMENT AND PLAN:  Discussed the following assessment and plan:  OP (osteoporosis) Patient with significant osteoporosis based on most recent T score.  She needs an anabolic treatment though is unable to tolerate Evenity.  We will have her discontinue this.  I discussed possible options for treatment with our clinical pharmacist and she will do research to help determine the next step in management.  Discussed with the patient that it can be weeks before her symptoms improve given the long half-life.  Given her low back pain she will have a lumbar spine film.  She will come in next week to have this done at her request.  We will also check lab work as outlined below given her fatigue though this may be related to the [place] given the time course.  Health maintenance: I encouraged the patient to get the COVID-19 booster.   Orders Placed This Encounter  Procedures  . DG Lumbar Spine Complete    Standing Status:   Future    Standing Expiration Date:   04/06/2021    Order Specific Question:   Reason for Exam (SYMPTOM  OR DIAGNOSIS REQUIRED)    Answer:   low back pain and stiffness for 2 months, history of osteoporosis    Order Specific Question:   Preferred imaging location?    Answer:   [facility]  .  Comp Met (CMET)    Standing Status:   Future    Standing Expiration Date:   04/06/2021  . TSH    Standing Status:   Future    Standing Expiration Date:   04/06/2021  . CBC    Standing Status:   Future    Standing Expiration Date:   04/06/2021  . IBC + Ferritin    Standing Status:   Future    Standing Expiration Date:   04/06/2021    No orders of the defined types were placed in this encounter.  Problem List Items Addressed This Visit    OP (osteoporosis)    Patient with significant osteoporosis based on most recent T score.  She needs an anabolic treatment though  is unable to tolerate Evenity.  We will have her discontinue this.  I discussed possible options for treatment with our clinical pharmacist and she will do research to help determine the next step in management.  Discussed with the patient that it can be weeks before her symptoms improve given the long half-life.  Given her low back pain she will have a lumbar spine film.  She will come in next week to have this done at her request.  We will also check lab work as outlined below given her fatigue though this may be related to the [place] given the time course.       Other Visit Diagnoses    Acute bilateral low back pain without sciatica    -  Primary   Relevant Orders   DG Lumbar Spine Complete   Fatigue, unspecified type       Relevant Orders   Comp Met (CMET)   TSH   CBC   IBC + Ferritin        I discussed the assessment and treatment plan with the patient. The patient was provided an opportunity to ask questions and all were answered. The patient agreed with the plan and demonstrated an understanding of the instructions.   The patient was advised to call back or seek an in-person evaluation if the symptoms worsen or if the condition fails to improve as anticipated.  I provided 9 minutes of non-face-to-face time during this encounter.   [name], MD

## 2020-04-06 NOTE — Assessment & Plan Note (Addendum)
Patient with significant osteoporosis based on most recent T score.  She needs an anabolic treatment though is unable to tolerate Evenity.  We will have her discontinue this.  I discussed possible options for treatment with our clinical pharmacist and she will do research to help determine the next step in management.  Discussed with the patient that it can be weeks before her symptoms improve given the long half-life.  Given her low back pain she will have a lumbar spine film.  She will come in next week to have this done at her request.  We will also check lab work as outlined below given her fatigue though this may be related to the [place] given the time course.

## 2020-04-07 ENCOUNTER — Other Ambulatory Visit: Payer: Self-pay | Admitting: Pain Medicine

## 2020-04-07 DIAGNOSIS — G894 Chronic pain syndrome: Secondary | ICD-10-CM

## 2020-04-11 ENCOUNTER — Ambulatory Visit: Payer: Medicare Other | Attending: Internal Medicine

## 2020-04-11 DIAGNOSIS — Z23 Encounter for immunization: Secondary | ICD-10-CM

## 2020-04-11 NOTE — Progress Notes (Signed)
   Covid-19 Vaccination Clinic  Name:  Amanda Soto    MRN: [id number] DOB: [birth year]  04/11/2020  Amanda Soto was observed post Covid-19 immunization for 15 minutes without incident. She was provided with Vaccine Information Sheet and instruction to access the V-Safe system.   Amanda Soto was instructed to call 911 with any severe reactions post vaccine: Marland Kitchen Difficulty breathing  . Swelling of face and throat  . A fast heartbeat  . A bad rash all over body  . Dizziness and weakness

## 2020-04-12 ENCOUNTER — Telehealth: Payer: Self-pay | Admitting: *Deleted

## 2020-04-12 NOTE — Telephone Encounter (Signed)
Called pharmacy and was on hold 11 minutes before I spoke with a staff member. Patient does indeed have a refill. Called and told patient.

## 2020-04-19 DIAGNOSIS — I480 Paroxysmal atrial fibrillation: Secondary | ICD-10-CM | POA: Diagnosis not present

## 2020-04-19 DIAGNOSIS — R011 Cardiac murmur, unspecified: Secondary | ICD-10-CM | POA: Diagnosis not present

## 2020-04-19 DIAGNOSIS — R002 Palpitations: Secondary | ICD-10-CM | POA: Diagnosis not present

## 2020-04-19 DIAGNOSIS — I495 Sick sinus syndrome: Secondary | ICD-10-CM | POA: Diagnosis not present

## 2020-04-19 DIAGNOSIS — R5382 Chronic fatigue, unspecified: Secondary | ICD-10-CM | POA: Diagnosis not present

## 2020-04-25 ENCOUNTER — Other Ambulatory Visit: Payer: Medicare Other

## 2020-04-25 DIAGNOSIS — R809 Proteinuria, unspecified: Secondary | ICD-10-CM | POA: Diagnosis not present

## 2020-04-26 ENCOUNTER — Ambulatory Visit (INDEPENDENT_AMBULATORY_CARE_PROVIDER_SITE_OTHER): Payer: Medicare Other

## 2020-04-26 ENCOUNTER — Other Ambulatory Visit: Payer: Self-pay

## 2020-04-26 ENCOUNTER — Other Ambulatory Visit: Payer: Self-pay | Admitting: Nephrology

## 2020-04-26 ENCOUNTER — Other Ambulatory Visit (INDEPENDENT_AMBULATORY_CARE_PROVIDER_SITE_OTHER): Payer: Medicare Other

## 2020-04-26 DIAGNOSIS — N1831 Chronic kidney disease, stage 3a: Secondary | ICD-10-CM | POA: Diagnosis not present

## 2020-04-26 DIAGNOSIS — Z23 Encounter for immunization: Secondary | ICD-10-CM

## 2020-04-26 DIAGNOSIS — R809 Proteinuria, unspecified: Secondary | ICD-10-CM

## 2020-04-26 DIAGNOSIS — R5383 Other fatigue: Secondary | ICD-10-CM | POA: Diagnosis not present

## 2020-04-26 DIAGNOSIS — M545 Low back pain, unspecified: Secondary | ICD-10-CM

## 2020-04-26 LAB — CBC
HCT: 41.4 % (ref 36.0–46.0)
Hemoglobin: 13.6 g/dL (ref 12.0–15.0)
MCHC: 32.8 g/dL (ref 30.0–36.0)
MCV: 93.7 fl (ref 78.0–100.0)
Platelets: 288 10*3/uL (ref 150.0–400.0)
RBC: 4.42 Mil/uL (ref 3.87–5.11)
RDW: 15.1 % (ref 11.5–15.5)
WBC: 10.7 10*3/uL — ABNORMAL HIGH (ref 4.0–10.5)

## 2020-04-26 LAB — COMPREHENSIVE METABOLIC PANEL
ALT: 9 U/L (ref 0–35)
AST: 19 U/L (ref 0–37)
Albumin: 4.2 g/dL (ref 3.5–5.2)
Alkaline Phosphatase: 55 U/L (ref 39–117)
BUN: 11 mg/dL (ref 6–23)
CO2: 27 mEq/L (ref 19–32)
Calcium: 9.5 mg/dL (ref 8.4–10.5)
Chloride: 98 mEq/L (ref 96–112)
Creatinine, Ser: 0.45 mg/dL (ref 0.40–1.20)
GFR: 83.06 mL/min (ref >60.00)
Glucose, Bld: 86 mg/dL (ref 70–99)
Potassium: 3.5 mEq/L (ref 3.5–5.1)
Sodium: 133 mEq/L — ABNORMAL LOW (ref 135–145)
Total Bilirubin: 1.2 mg/dL (ref 0.2–1.2)
Total Protein: 7.3 g/dL (ref 6.0–8.3)

## 2020-04-26 LAB — IBC + FERRITIN
Ferritin: 127.6 ng/mL (ref 10.0–291.0)
Iron: 74 ug/dL (ref 42–145)
Saturation Ratios: 21.1 % (ref 20.0–50.0)
Transferrin: 251 mg/dL (ref 212.0–360.0)

## 2020-04-26 LAB — TSH: TSH: 1.81 u[IU]/mL (ref 0.35–4.50)

## 2020-04-26 NOTE — Progress Notes (Signed)
Labs ordered.

## 2020-04-26 NOTE — Addendum Note (Signed)
Addended by: Leeanne Rio on: 04/26/2020 02:22 PM   Modules accepted: Orders

## 2020-04-26 NOTE — Addendum Note (Signed)
Addended by: Leeanne Rio on: 04/26/2020 02:41 PM   Modules accepted: Orders

## 2020-04-28 ENCOUNTER — Other Ambulatory Visit: Payer: Self-pay | Admitting: Family Medicine

## 2020-04-28 DIAGNOSIS — E871 Hypo-osmolality and hyponatremia: Secondary | ICD-10-CM

## 2020-04-28 DIAGNOSIS — D72829 Elevated white blood cell count, unspecified: Secondary | ICD-10-CM

## 2020-04-29 LAB — PROTEIN ELECTROPHORESIS, SERUM
Abnormal Protein Band1: 0.4 g/dL — ABNORMAL HIGH
Albumin ELP: 4.2 g/dL (ref 3.8–4.8)
Alpha 1: 0.3 g/dL (ref 0.2–0.3)
Alpha 2: 0.9 g/dL (ref 0.5–0.9)
Beta 2: 0.3 g/dL (ref 0.2–0.5)
Beta Globulin: 0.4 g/dL (ref 0.4–0.6)
Gamma Globulin: 1.2 g/dL (ref 0.8–1.7)
Total Protein: 7.3 g/dL (ref 6.1–8.1)

## 2020-04-29 LAB — KAPPA/LAMBDA LIGHT CHAINS
Kappa free light chain: 27.1 mg/L — ABNORMAL HIGH (ref 3.3–19.4)
Kappa:Lambda Ratio: 2.02 — ABNORMAL HIGH (ref 0.26–1.65)
Lambda Free Lght Chn: 13.4 mg/L (ref 5.7–26.3)

## 2020-04-29 LAB — RENAL FUNCTION PANEL
Albumin: 4.1 g/dL (ref 3.6–5.1)
BUN/Creatinine Ratio: 24 (calc) — ABNORMAL HIGH (ref 6–22)
BUN: 11 mg/dL (ref 7–25)
CO2: 23 mmol/L (ref 20–32)
Calcium: 9.4 mg/dL (ref 8.6–10.4)
Chloride: 99 mmol/L (ref 98–110)
Creat: 0.46 mg/dL — ABNORMAL LOW (ref 0.60–0.88)
Glucose, Bld: 90 mg/dL (ref 65–99)
Phosphorus: 3.1 mg/dL (ref 2.1–4.3)
Potassium: 3.9 mmol/L (ref 3.5–5.3)
Sodium: 135 mmol/L (ref 135–146)

## 2020-04-29 LAB — HEPATITIS C ANTIBODY
Hepatitis C Ab: NONREACTIVE
SIGNAL TO CUT-OFF: 0.01 (ref <1.00)

## 2020-04-29 LAB — HEPATITIS B SURFACE ANTIBODY,QUALITATIVE: Hep B S Ab: NONREACTIVE

## 2020-04-29 LAB — HEPATITIS B SURFACE ANTIGEN: Hepatitis B Surface Ag: NONREACTIVE

## 2020-05-06 ENCOUNTER — Other Ambulatory Visit: Payer: Self-pay | Admitting: Family Medicine

## 2020-05-11 ENCOUNTER — Other Ambulatory Visit (INDEPENDENT_AMBULATORY_CARE_PROVIDER_SITE_OTHER): Payer: Medicare Other

## 2020-05-11 ENCOUNTER — Other Ambulatory Visit: Payer: Self-pay

## 2020-05-11 DIAGNOSIS — D72829 Elevated white blood cell count, unspecified: Secondary | ICD-10-CM

## 2020-05-11 DIAGNOSIS — E871 Hypo-osmolality and hyponatremia: Secondary | ICD-10-CM | POA: Diagnosis not present

## 2020-05-11 LAB — CBC WITH DIFFERENTIAL/PLATELET
Basophils Absolute: 0 10*3/uL (ref 0.0–0.1)
Basophils Relative: 0.6 % (ref 0.0–3.0)
Eosinophils Absolute: 0.1 10*3/uL (ref 0.0–0.7)
Eosinophils Relative: 1.5 % (ref 0.0–5.0)
HCT: 39.8 % (ref 36.0–46.0)
Hemoglobin: 13.1 g/dL (ref 12.0–15.0)
Lymphocytes Relative: 30.8 % (ref 12.0–46.0)
Lymphs Abs: 2.3 10*3/uL (ref 0.7–4.0)
MCHC: 33 g/dL (ref 30.0–36.0)
MCV: 94.3 fl (ref 78.0–100.0)
Monocytes Absolute: 0.5 10*3/uL (ref 0.1–1.0)
Monocytes Relative: 6.8 % (ref 3.0–12.0)
Neutro Abs: 4.5 10*3/uL (ref 1.4–7.7)
Neutrophils Relative %: 60.3 % (ref 43.0–77.0)
Platelets: 240 10*3/uL (ref 150.0–400.0)
RBC: 4.22 Mil/uL (ref 3.87–5.11)
RDW: 14.7 % (ref 11.5–15.5)
WBC: 7.4 10*3/uL (ref 4.0–10.5)

## 2020-05-11 LAB — BASIC METABOLIC PANEL
BUN: 14 mg/dL (ref 6–23)
CO2: 32 mEq/L (ref 19–32)
Calcium: 9.3 mg/dL (ref 8.4–10.5)
Chloride: 97 mEq/L (ref 96–112)
Creatinine, Ser: 0.52 mg/dL (ref 0.40–1.20)
GFR: 80.19 mL/min (ref >60.00)
Glucose, Bld: 92 mg/dL (ref 70–99)
Potassium: 4 mEq/L (ref 3.5–5.1)
Sodium: 134 mEq/L — ABNORMAL LOW (ref 135–145)

## 2020-05-11 LAB — CBC: RBC: 4.22 (ref 3.87–5.11)

## 2020-05-11 LAB — CBC AND DIFFERENTIAL: WBC: 7.4

## 2020-05-25 ENCOUNTER — Telehealth: Payer: Self-pay

## 2020-05-25 NOTE — Telephone Encounter (Signed)
Lab results given to patient by phone.  Essie Gehret,cma

## 2020-05-30 DIAGNOSIS — R809 Proteinuria, unspecified: Secondary | ICD-10-CM | POA: Diagnosis not present

## 2020-06-06 DIAGNOSIS — R809 Proteinuria, unspecified: Secondary | ICD-10-CM | POA: Diagnosis not present

## 2020-06-16 ENCOUNTER — Encounter: Payer: Self-pay | Admitting: Podiatry

## 2020-06-16 ENCOUNTER — Ambulatory Visit: Payer: Medicare Other | Admitting: Podiatry

## 2020-06-16 ENCOUNTER — Other Ambulatory Visit: Payer: Self-pay

## 2020-06-16 DIAGNOSIS — D689 Coagulation defect, unspecified: Secondary | ICD-10-CM

## 2020-06-16 DIAGNOSIS — M79609 Pain in unspecified limb: Secondary | ICD-10-CM

## 2020-06-16 DIAGNOSIS — L608 Other nail disorders: Secondary | ICD-10-CM

## 2020-06-16 DIAGNOSIS — B351 Tinea unguium: Secondary | ICD-10-CM | POA: Diagnosis not present

## 2020-06-16 NOTE — Progress Notes (Signed)
This patient returns to my office for at risk foot care.  This patient requires this care by a professional since this patient will be at risk due to having  Coagulation defect.  Patient is taking eliquiss.  This patient is unable to cut nails herself since the patient cannot reach her  nails.These nails are painful walking and wearing shoes.  This patient presents for at risk foot care today. She presents to the office with her niece.  General Appearance  Alert, conversant and in no acute stress.  Vascular  Dorsalis pedis and posterior tibial  pulses are palpable  bilaterally.  Capillary return is within normal limits  bilaterally. Temperature is within normal limits  bilaterally.  Neurologic  Senn-Weinstein monofilament wire test within normal limits  bilaterally. Muscle power within normal limits bilaterally.  Nails Thick disfigured discolored nails with subungual debris  from hallux to fifth toes bilaterally. No evidence of bacterial infection or drainage bilaterally. Pincer nails.  Orthopedic  No limitations of motion  feet .  No crepitus or effusions noted.  No bony pathology or digital deformities noted.Hammer toes second.  Skin  normotropic skin with no porokeratosis noted bilaterally.  No signs of infections or ulcers noted.     Onychomycosis  Pain in right toes  Pain in left toes  Consent was obtained for treatment procedures.   Mechanical debridement of nails 1-5  bilaterally performed with a nail nipper.  Filed with dremel without incident. No infection or ulcer.     Return office visit 3 months         Told patient to return for periodic foot care and evaluation due to potential at risk complications.   Gardiner Barefoot DPM

## 2020-06-17 ENCOUNTER — Ambulatory Visit: Payer: Medicare Other | Admitting: Family Medicine

## 2020-06-19 NOTE — Progress Notes (Signed)
Patient: Amanda Soto  Service Category: E/M  Provider: Gaspar Cola, MD  DOB: 04-Jan-1925  DOS: 06/20/2020  Location: Office  MRN: [id number]  Setting: Ambulatory outpatient  Referring Provider: [name], MD  Type: Established Patient  Specialty: Interventional Pain Management  PCP: [name], MD  Location: Remote location  Delivery: TeleHealth     Virtual Encounter - Pain Management PROVIDER NOTE: Information contained herein reflects review and annotations entered in association with encounter. Interpretation of such information and data should be left to medically-trained personnel. Information provided to patient can be located elsewhere in the medical record under "Patient Instructions". Document created using STT-dictation technology, any transcriptional errors that may result from process are unintentional.    Contact & Pharmacy Preferred: [place]: [phone] (home) Mobile: [phone] (mobile) E-mail: [name]  [id number] [place] [id number]: [phone]: [phone]  Pre-screening  Amanda Soto offered "in-person" vs "virtual" encounter. She indicated preferring virtual for this encounter.   Reason COVID-19*  Social distancing based on CDC and AMA recommendations.   I contacted PPALLAVI CLIFTONon 06/20/2020 via telephone.      I clearly identified myself as FGaspar Cola MD. I verified that I was speaking with the correct person using two identifiers (Name: Amanda Soto and date of birth: 8April 02, 1926.  Consent I sought verbal advanced consent from Amanda Pullerfor virtual visit interactions. I informed Amanda Soto of possible security and privacy concerns, risks, and limitations associated with providing "not-in-person" medical evaluation and management services. I also informed Ms. Smelcer of the availability of "in-person" appointments. Finally, I  informed her that there would be a charge for the virtual visit and that she could be  personally, fully or partially, financially responsible for it. Ms. HArmacostexpressed understanding and agreed to proceed.   Historic Elements   Ms. PGIOVANNA KEMMERERis a 86y.o. year old, female patient evaluated today after our last contact on 04/07/2020. Amanda Soto has a past medical history of Acute postoperative pain (12/17/2016), Anemia (11/10/2015), Arthritis, Atrial fibrillation ([place], CHF (congestive heart failure) ([place], Chickenpox, Chronic abdominal pain (01/16/2014), Closed Colles' fracture (12/17/2016), Degenerative arthritis of hip (08/19/2014), Degenerative arthritis of lumbar spine (11/18/2013), Depression, Diverticulitis, GERD (gastroesophageal reflux disease), Heart murmur, Heart rate slow, Hyperlipidemia, Hypertension, Hypothyroid, Neuritis or radiculitis due to rupture of lumbar intervertebral disc (11/18/2013), Skin cancer (2016 ?), Symptomatic anemia (11/09/2015), and Trochanteric bursitis of right hip (11/18/2013). She also  has a past surgical history that includes Cataract extraction; Partial hysterectomy; Appendectomy; and Tonsillectomy. Ms. HPacificohas a current medication list which includes the following prescription(s): acetaminophen, amoxicillin-clavulanate, vitamin d3, eliquis, fluticasone, furosemide, multiple vitamins-minerals, mupirocin ointment, omeprazole, sotalol, synthroid, triamcinolone, vitamin b-12, and [START ON 06/27/2020] tramadol. She  reports that she has never smoked. She has never used smokeless tobacco. She reports current alcohol use of about 1.0 standard drink of alcohol per week. She reports that she does not use drugs. Ms. HSchoneis allergic to hydrocodone, ciprofloxacin, and latex.   HPI  Today, she is being contacted for medication management.  The patient indicates doing well with the current medication regimen. No adverse reactions or side effects reported to the medications. PMP  compliant.  The patient indicates that she is beginning to experience recurrence of her right-sided low back pain and would like to come in for another procedure.  The last time that we did an  injection for her was in August.  She also will need to do a UDS at that time.  RTCB: 07/27/2020  Pharmacotherapy Assessment  Analgesic: Tramadol 50 mg, 1 tablet PO q 6 hrs (200 mg/day of tramadol) MME/day: 20 mg/day.   Monitoring: [place] PMP: PDMP reviewed during this encounter.       Pharmacotherapy: No side-effects or adverse reactions reported. Compliance: No problems identified. Effectiveness: Clinically acceptable. Plan: Refer to "POC".  UDS: No results found for: SUMMARY  Laboratory Chemistry Profile   Renal Lab Results  Component Value Date   BUN 14 05/11/2020   CREATININE 0.52 05/11/2020   LABCREA 90 01/11/2020   BCR 24 (H) 04/26/2020   GFR 80.19 05/11/2020   GFRAA >60 12/18/2019   GFRNONAA >60 12/18/2019     Hepatic Lab Results  Component Value Date   AST 19 04/26/2020   ALT 9 04/26/2020   ALBUMIN 4.2 04/26/2020   ALKPHOS 55 04/26/2020   LIPASE 181 11/02/2013     Electrolytes Lab Results  Component Value Date   NA 134 (L) 05/11/2020   K 4.0 05/11/2020   CL 97 05/11/2020   CALCIUM 9.3 05/11/2020   MG 2.0 03/30/2019   PHOS 3.1 04/26/2020     Bone Lab Results  Component Value Date   VD25OH 28.73 (L) 01/11/2020   25OHVITD1 14 (L) 03/30/2019   25OHVITD2 <1.0 03/30/2019   25OHVITD3 14 03/30/2019     Inflammation (CRP: Acute Phase) (ESR: Chronic Phase) Lab Results  Component Value Date   CRP <1 03/30/2019   ESRSEDRATE 50 (H) 03/30/2019       Note: Above Lab results reviewed.  Imaging  DG Lumbar Spine Complete CLINICAL DATA:  Low back pain and stiffness for 2 months, osteoporosis  EXAM: LUMBAR SPINE - COMPLETE 4+ VIEW  COMPARISON:  11/14/2015  FINDINGS: Frontal, bilateral oblique, and lateral views of the lumbar spine are obtained. There are 5  non-rib-bearing lumbar type vertebral bodies identified, with left convex curvature centered at L2. There is progressive facet hypertrophy from L3 through S1, with mild anterolisthesis of L3 on L4 and L4 on L5. No acute fractures. Mild diffuse disc space narrowing most pronounced at the L3/L4 level. Sacroiliac joints appear normal. Symmetrical bilateral hip osteoarthritis. Diffuse aortic atherosclerosis again noted.  IMPRESSION: 1. Progressive multilevel lumbar spondylosis and facet hypertrophy. 2. Left convex scoliosis. 3. No acute fracture.  Electronically Signed   By: Randa Ngo M.D.   On: 04/29/2020 08:30  Assessment  The primary encounter diagnosis was Chronic pain syndrome. Diagnoses of Chronic low back pain (Primary Area of Pain) (Right), Grade 1 Anterolisthesis of L4 over L5 (5 mm), Lumbar facet syndrome (Right), Lumbar facet arthropathy, Vertebral fracture, osteoporotic, sequela, Chronic anticoagulation (Eliquis), Pharmacologic therapy, and Uncomplicated opioid dependence ([place]) were also pertinent to this visit.  Plan of Care  Problem-specific:  No problem-specific Assessment & Plan notes found for this encounter.  Ms. ARABELL NERIA has a current medication list which includes the following long-term medication(s): eliquis, fluticasone, furosemide, sotalol, synthroid, and [START ON 06/27/2020] tramadol.  Pharmacotherapy (Medications Ordered): Meds ordered this encounter  Medications  . traMADol (ULTRAM) 50 MG tablet    Sig: Take 1 tablet (50 mg total) by mouth 4 (four) times daily.    Dispense:  120 tablet    Refill:  0    Chronic Pain: STOP Act (Not applicable) Fill 1 day early if closed on refill date. Avoid benzodiazepines within 8 hours of opioids   Orders:  Orders Placed This Encounter  Procedures  . LUMBAR FACET(MEDIAL BRANCH NERVE BLOCK) MBNB    Standing Status:   Future    Standing Expiration Date:   07/21/2020    Scheduling Instructions:     Procedure:  Lumbar facet block (AKA.: Lumbosacral medial branch nerve block)     Side: Right-sided     Level: L3-4, L4-5, & L5-S1 Facets (L2, L3, L4, L5, & S1 Medial Branch Nerves)     Sedation: Patient's choice.     Timeframe: ASAA    Order Specific Question:   Where will this procedure be performed?    Answer:   [facility]  . Blood Thinner Instructions to Nursing    Always make sure patient has clearance from prescribing physician to stop blood thinners for interventional therapies. If the patient requires a Lovenox-bridge therapy, make sure arrangements are made to institute it with the assistance of the PCP.    Scheduling Instructions:     Have Ms. Farrington stop the Eliquis (Apixaban) x 3 days prior to procedure or surgery.   Follow-up plan:   Return for Procedure (w/ sedation): (R) L-FCT BLK, (Blood Thinner Protocol) + , (F2F), (Med Mgmt).      Considering: NOTE:Stop Eliquisfor 3 daysprior to aprocedure.    Palliative PRN treatment(s): Palliative/therapeutic caudal ESI #2 (100/100/90/90) Palliative/Therapeuticright lumbar facet RFA #3(last done 03/27/2018) (100/100/100/90) (100/90/0) Palliative/therapeutic right-sidedSIjoint RFA#1 (last done 03/27/2018) (100/90/0) Palliative right lumbar facet block#4(100/100/90/75) Palliative right sacroiliac joint block#4     Recent Visits No visits were found meeting these conditions. Showing recent visits within past 90 days and meeting all other requirements Today's Visits Date Type Provider Dept  06/20/20 Telemedicine Milinda Pointer, MD [facility]  Showing today's visits and meeting all other requirements Future Appointments No visits were found meeting these conditions. Showing future appointments within next 90 days and meeting all other requirements  I discussed the assessment and treatment plan with the patient. The patient was provided an opportunity to ask questions and all were answered. The patient  agreed with the plan and demonstrated an understanding of the instructions.  Patient advised to call back or seek an in-person evaluation if the symptoms or condition worsens.  Duration of encounter: 12 minutes.  Note by: Gaspar Cola, MD Date: 06/20/2020; Time: 11:30 AM

## 2020-06-20 ENCOUNTER — Other Ambulatory Visit: Payer: Self-pay

## 2020-06-20 ENCOUNTER — Ambulatory Visit: Payer: Medicare Other | Attending: Pain Medicine | Admitting: Pain Medicine

## 2020-06-20 DIAGNOSIS — M8008XS Age-related osteoporosis with current pathological fracture, vertebra(e), sequela: Secondary | ICD-10-CM

## 2020-06-20 DIAGNOSIS — M545 Low back pain, unspecified: Secondary | ICD-10-CM | POA: Diagnosis not present

## 2020-06-20 DIAGNOSIS — G894 Chronic pain syndrome: Secondary | ICD-10-CM | POA: Diagnosis not present

## 2020-06-20 DIAGNOSIS — M47816 Spondylosis without myelopathy or radiculopathy, lumbar region: Secondary | ICD-10-CM | POA: Diagnosis not present

## 2020-06-20 DIAGNOSIS — M4310 Spondylolisthesis, site unspecified: Secondary | ICD-10-CM

## 2020-06-20 DIAGNOSIS — G8929 Other chronic pain: Secondary | ICD-10-CM

## 2020-06-20 DIAGNOSIS — Z7901 Long term (current) use of anticoagulants: Secondary | ICD-10-CM

## 2020-06-20 DIAGNOSIS — F1120 Opioid dependence, uncomplicated: Secondary | ICD-10-CM | POA: Insufficient documentation

## 2020-06-20 DIAGNOSIS — Z79899 Other long term (current) drug therapy: Secondary | ICD-10-CM

## 2020-06-20 MED ORDER — TRAMADOL HCL 50 MG PO TABS: 50.0000 mg | ORAL_TABLET | Freq: Four times a day (QID) | ORAL | 0 refills | Status: DC

## 2020-06-20 NOTE — Patient Instructions (Signed)
____________________________________________________________________________________________  Virtual Visits   Eligibility In order to be eligible for "Virtual Visit Encounters", you must be available over the phone. It is the patient's responsibility to make sure we have a way to contact you.  We understand how people are reluctant to pickup on "unknown" calls, therefore, we suggest adding our telephone numbers to your list of "CONTACT(s)". This way, you should be able to readily identify our calls when you receive them.  When will this type of visits be used? The decision will be made on a case by case basis.  At what time will I be called? This is an excellent question. The providers will try to call you during the day, whenever they have spare time. For the purpose of the day's schedule, you are assigned a time for your appointment, however, the call may come anytime during the day. We need you to be available on a moment's notice. If you are unable to do this, then request an "in-person" appointment rather than a "virtual visit".  Can I request my medication visits to be "Virtual"? Yes you may request it, but the decision is entirely up to the healthcare provider. Control substances require specific monitoring that requires Face-to-Face encounters. The number of encounters  and the extent of the monitoring is determined on a case by case basis.  Add a new contact to your smart phone and label it "PAIN CLINIC" Under this contact add the following numbers: Main: [phone] (Official Contact Number) Nurses: [phone] (These are outgoing only calling systems. Do not call this number.) Dr. Dossie Arbour: [phone] or [phone] (Outgoing calls only. Do not call this number.)  ____________________________________________________________________________________________

## 2020-07-05 ENCOUNTER — Ambulatory Visit
Admission: RE | Admit: 2020-07-05 | Discharge: 2020-07-05 | Disposition: A | Discharge status: Home / Self Care | Payer: Medicare Other | Source: Ambulatory Visit | Attending: Pain Medicine | Admitting: Pain Medicine

## 2020-07-05 ENCOUNTER — Ambulatory Visit (HOSPITAL_BASED_OUTPATIENT_CLINIC_OR_DEPARTMENT_OTHER): Payer: Medicare Other | Admitting: Pain Medicine

## 2020-07-05 ENCOUNTER — Encounter: Payer: Self-pay | Admitting: Pain Medicine

## 2020-07-05 ENCOUNTER — Other Ambulatory Visit: Payer: Self-pay

## 2020-07-05 VITALS — BP 161/45 | HR 49 | Temp 97.0°F | Resp 24 | Ht <= 58 in | Wt 118.0 lb

## 2020-07-05 DIAGNOSIS — Z7901 Long term (current) use of anticoagulants: Secondary | ICD-10-CM | POA: Diagnosis not present

## 2020-07-05 DIAGNOSIS — M47816 Spondylosis without myelopathy or radiculopathy, lumbar region: Secondary | ICD-10-CM | POA: Diagnosis not present

## 2020-07-05 DIAGNOSIS — Z91040 Latex allergy status: Secondary | ICD-10-CM

## 2020-07-05 DIAGNOSIS — M47817 Spondylosis without myelopathy or radiculopathy, lumbosacral region: Secondary | ICD-10-CM | POA: Diagnosis not present

## 2020-07-05 DIAGNOSIS — M545 Low back pain, unspecified: Secondary | ICD-10-CM | POA: Insufficient documentation

## 2020-07-05 DIAGNOSIS — G8929 Other chronic pain: Secondary | ICD-10-CM | POA: Insufficient documentation

## 2020-07-05 DIAGNOSIS — G894 Chronic pain syndrome: Secondary | ICD-10-CM | POA: Insufficient documentation

## 2020-07-05 DIAGNOSIS — M4310 Spondylolisthesis, site unspecified: Secondary | ICD-10-CM | POA: Diagnosis not present

## 2020-07-05 DIAGNOSIS — M5136 Other intervertebral disc degeneration, lumbar region: Secondary | ICD-10-CM | POA: Diagnosis not present

## 2020-07-05 MED ORDER — LACTATED RINGERS IV SOLN: 1000.0000 mL | Freq: Once | INTRAVENOUS | Status: DC

## 2020-07-05 MED ORDER — ROPIVACAINE HCL 2 MG/ML IJ SOLN
9.0000 mL | Freq: Once | INTRAMUSCULAR | Status: AC
  Administered 2020-07-05: 9 mL via PERINEURAL
  Filled 2020-07-05: qty 10

## 2020-07-05 MED ORDER — TRAMADOL HCL 50 MG PO TABS
50.0000 mg | ORAL_TABLET | Freq: Four times a day (QID) | ORAL | 2 refills | Status: DC
Start: 2020-07-27 — End: 2020-10-23

## 2020-07-05 MED ORDER — FENTANYL CITRATE (PF) 100 MCG/2ML IJ SOLN: 25.0000 ug | INTRAMUSCULAR | Status: DC | PRN

## 2020-07-05 MED ORDER — LIDOCAINE HCL 2 % IJ SOLN
20.0000 mL | Freq: Once | INTRAMUSCULAR | Status: AC
  Administered 2020-07-05: 400 mg
  Filled 2020-07-05: qty 40

## 2020-07-05 MED ORDER — TRIAMCINOLONE ACETONIDE 40 MG/ML IJ SUSP
40.0000 mg | Freq: Once | INTRAMUSCULAR | Status: AC
  Administered 2020-07-05: 40 mg
  Filled 2020-07-05: qty 1

## 2020-07-05 MED ORDER — MIDAZOLAM HCL 5 MG/5ML IJ SOLN: 1.0000 mg | INTRAMUSCULAR | Status: DC | PRN

## 2020-07-05 NOTE — Patient Instructions (Addendum)
____________________________________________________________________________________________  Blood Thinners  IMPORTANT NOTICE:  If you take any of these, make sure to notify the nursing staff.  Failure to do so may result in injury.  Recommended time intervals to stop and restart blood-thinners, before & after invasive procedures  Generic Name Brand Name Stop Time. Must be stopped at least this long before procedures. After procedures, wait at least this long before re-starting.  Abciximab Reopro 15 days 2 hrs  Alteplase Activase 10 days 10 days  Anagrelide Agrylin    Apixaban Eliquis 3 days 6 hrs  Cilostazol Pletal 3 days 5 hrs  Clopidogrel Plavix 7-10 days 2 hrs  Dabigatran Pradaxa 5 days 6 hrs  Dalteparin Fragmin 24 hours 4 hrs  Dipyridamole Aggrenox 11days 2 hrs  Edoxaban Lixiana; Savaysa 3 days 2 hrs  Enoxaparin  Lovenox 24 hours 4 hrs  Eptifibatide Integrillin 8 hours 2 hrs  Fondaparinux  Arixtra 72 hours 12 hrs  Prasugrel Effient 7-10 days 6 hrs  Reteplase Retavase 10 days 10 days  Rivaroxaban Xarelto 3 days 6 hrs  Ticagrelor Brilinta 5-7 days 6 hrs  Ticlopidine Ticlid 10-14 days 2 hrs  Tinzaparin Innohep 24 hours 4 hrs  Tirofiban Aggrastat 8 hours 2 hrs  Warfarin Coumadin 5 days 2 hrs   Other medications with blood-thinning effects  Product indications Generic (Brand) names Note  Cholesterol Lipitor Stop 4 days before procedure  Blood thinner (injectable) Heparin (LMW or LMWH Heparin) Stop 24 hours before procedure  Cancer Ibrutinib (Imbruvica) Stop 7 days before procedure  Malaria/Rheumatoid Hydroxychloroquine (Plaquenil) Stop 11 days before procedure  Thrombolytics  10 days before or after procedures   Over-the-counter (OTC) Products with blood-thinning effects  Product Common names Stop Time  Aspirin > 325 mg Goody Powders, Excedrin, etc. 11 days  Aspirin ? 81 mg  7 days  Fish oil  4 days  Garlic supplements  7 days  Ginkgo biloba  36 hours  Ginseng  24  hours  NSAIDs Ibuprofen, Naprosyn, etc. 3 days  Vitamin E  4 days   ____________________________________________________________________________________________ ____________________________________________________________________________________________  Post-Procedure Discharge Instructions  Instructions:  Apply ice:   Purpose: This will minimize any swelling and discomfort after procedure.   When: Day of procedure, as soon as you get home.  How: Fill a plastic sandwich bag with crushed ice. Cover it with a small towel and apply to injection site.  How long: (15 min on, 15 min off) Apply for 15 minutes then remove x 15 minutes.  Repeat sequence on day of procedure, until you go to bed.  Apply heat:   Purpose: To treat any soreness and discomfort from the procedure.  When: Starting the next day after the procedure.  How: Apply heat to procedure site starting the day following the procedure.  How long: May continue to repeat daily, until discomfort goes away.  Food intake: Start with clear liquids (like water) and advance to regular food, as tolerated.   Physical activities: Keep activities to a minimum for the first 8 hours after the procedure. After that, then as tolerated.  Driving: If you have received any sedation, be responsible and do not drive. You are not allowed to drive for 24 hours after having sedation.  Blood thinner: (Applies only to those taking blood thinners) You may restart your blood thinner 6 hours after your procedure.  Insulin: (Applies only to Diabetic patients taking insulin) As soon as you can eat, you may resume your normal dosing schedule.  Infection prevention: Keep procedure site clean  and dry. Shower daily and clean area with soap and water.  Post-procedure Pain Diary: Extremely important that this be done correctly and accurately. Recorded information will be used to determine the next step in treatment. For the purpose of accuracy, follow  these rules:  Evaluate only the area treated. Do not report or include pain from an untreated area. For the purpose of this evaluation, ignore all other areas of pain, except for the treated area.  After your procedure, avoid taking a long nap and attempting to complete the pain diary after you wake up. Instead, set your alarm clock to go off every hour, on the hour, for the initial 8 hours after the procedure. Document the duration of the numbing medicine, and the relief you are getting from it.  Do not go to sleep and attempt to complete it later. It will not be accurate. If you received sedation, it is likely that you were given a medication that may cause amnesia. Because of this, completing the diary at a later time may cause the information to be inaccurate. This information is needed to plan your care.  Follow-up appointment: Keep your post-procedure follow-up evaluation appointment after the procedure (usually 2 weeks for most procedures, 6 weeks for radiofrequencies). DO NOT FORGET to bring you pain diary with you.   Expect: (What should I expect to see with my procedure?)  From numbing medicine (AKA: Local Anesthetics): Numbness or decrease in pain. You may also experience some weakness, which if present, could last for the duration of the local anesthetic.  Onset: Full effect within 15 minutes of injected.  Duration: It will depend on the type of local anesthetic used. On the average, 1 to 8 hours.   From steroids (Applies only if steroids were used): Decrease in swelling or inflammation. Once inflammation is improved, relief of the pain will follow.  Onset of benefits: Depends on the amount of swelling present. The more swelling, the longer it will take for the benefits to be seen. In some cases, up to 10 days.  Duration: Steroids will stay in the system x 2 weeks. Duration of benefits will depend on multiple posibilities including persistent irritating factors.  Side-effects: If  present, they may typically last 2 weeks (the duration of the steroids).  Frequent: Cramps (if they occur, drink Gatorade and take over-the-counter Magnesium 450-500 mg once to twice a day); water retention with temporary weight gain; increases in blood sugar; decreased immune system response; increased appetite.  Occasional: Facial flushing (red, warm cheeks); mood swings; menstrual changes.  Uncommon: Long-term decrease or suppression of natural hormones; bone thinning. (These are more common with higher doses or more frequent use. This is why we prefer that our patients avoid having any injection therapies in other practices.)   Very Rare: Severe mood changes; psychosis; aseptic necrosis.  From procedure: Some discomfort is to be expected once the numbing medicine wears off. This should be minimal if ice and heat are applied as instructed.  Call if: (When should I call?)  You experience numbness and weakness that gets worse with time, as opposed to wearing off.  New onset bowel or bladder incontinence. (Applies only to procedures done in the spine)  Emergency Numbers:  Durning business hours (Monday - Thursday, 8:00 AM - 4:00 PM) (Friday, 9:00 AM - 12:00 Noon): [phone]  After hours: [phone]  NOTE: If you are having a problem and are unable connect with, or to talk to a provider, then go to  your nearest urgent care or emergency department. If the problem is serious and urgent, please call 911. ____________________________________________________________________________________________

## 2020-07-05 NOTE — Progress Notes (Signed)
PROVIDER NOTE: Information contained herein reflects review and annotations entered in association with encounter. Interpretation of such information and data should be left to medically-trained personnel. Information provided to patient can be located elsewhere in the medical record under "Patient Instructions". Document created using STT-dictation technology, any transcriptional errors that may result from process are unintentional.    Patient: Amanda Soto  Service Category: Procedure  Provider: Gaspar Cola, MD  DOB: 04/23/1925  DOS: 07/05/2020  Location: [facility]  MRN: [id number]  Setting: Ambulatory - outpatient  Referring Provider: [name], MD  Type: Established Patient  Specialty: Interventional Pain Management  PCP: [name], MD   Primary Reason for Visit: Interventional Pain Management Treatment. CC: Back Pain (Right side)  Procedure:          Anesthesia, Analgesia, Anxiolysis:  Type: Lumbar Facet, Medial Branch Block(s) #4  Primary Purpose: Palliative Region: Posterolateral Lumbosacral Spine Level: L2, L3, L4, L5, & S1 Medial Branch Level(s). Injecting these levels blocks the L3-4, L4-5, and L5-S1 lumbar facet joints. Laterality: Right  Type: Local Anesthesia Indication(s): Analgesia         Route: Infiltration ([facility]/IM) IV Access: Declined Sedation: Declined  Local Anesthetic: Lidocaine 1-2%  Position: Prone   Indications: 1. Lumbar facet syndrome (Right)   2. Spondylosis without myelopathy or radiculopathy, lumbosacral region   3. Lumbar facet arthropathy   4. Grade 1 Anterolisthesis of L4 over L5 (5 mm)   5. DDD (degenerative disc disease), lumbar   6. Chronic low back pain (1ry area of Pain) (Right)    Chronic anticoagulation (Eliquis)    Latex precautions, history of latex allergy    Pain Score: Pre-procedure: 7 /10 Post-procedure: 0-No pain/10   Pre-op H&P Assessment:  Amanda Soto is a 87 y.o. (year old), female  patient, seen today for interventional treatment. She  has a past surgical history that includes Cataract extraction; Partial hysterectomy; Appendectomy; and Tonsillectomy. Amanda Soto has a current medication list which includes the following prescription(s): acetaminophen, vitamin d3, eliquis, fluticasone, furosemide, multiple vitamins-minerals, omeprazole, sotalol, synthroid, [START ON 07/27/2020] tramadol, triamcinolone, vitamin b-12, amoxicillin-clavulanate, and mupirocin ointment. Her primarily concern today is the Back Pain (Right side)  Initial Vital Signs:  Pulse/HCG Rate: (!) 49ECG Heart Rate: (!) 57 Temp: (!) 97 F (36.1 C) Resp: (!) 23 BP: (!) 138/44 SpO2: 99 %  BMI: Estimated body mass index is 24.66 kg/m as calculated from the following:   Height as of this encounter: 4\' 10"  (1.473 m).   Weight as of this encounter: 118 lb (53.5 kg).  Risk Assessment: Allergies: Reviewed. She is allergic to hydrocodone, ciprofloxacin, and latex.  Allergy Precautions: None required Coagulopathies: Reviewed. None identified.  Blood-thinner therapy: None at this time Active Infection(s): Reviewed. None identified. Amanda Soto is afebrile  Site Confirmation: Amanda Soto was asked to confirm the procedure and laterality before marking the site Procedure checklist: Completed Consent: Before the procedure and under the influence of no sedative(s), amnesic(s), or anxiolytics, the patient was informed of the treatment options, risks and possible complications. To fulfill our ethical and legal obligations, as recommended by the American Medical Association's Code of Ethics, I have informed the patient of my clinical impression; the nature and purpose of the treatment or procedure; the risks, benefits, and possible complications of the intervention; the alternatives, including doing nothing; the risk(s) and benefit(s) of the alternative treatment(s) or procedure(s); and the risk(s) and benefit(s) of doing  nothing. The patient was provided information about  the general risks and possible complications associated with the procedure. These may include, but are not limited to: failure to achieve desired goals, infection, bleeding, organ or nerve damage, allergic reactions, paralysis, and death. In addition, the patient was informed of those risks and complications associated to Spine-related procedures, such as failure to decrease pain; infection (i.e.: Meningitis, epidural or intraspinal abscess); bleeding (i.e.: epidural hematoma, subarachnoid hemorrhage, or any other type of intraspinal or peri-dural bleeding); organ or nerve damage (i.e.: Any type of peripheral nerve, nerve root, or spinal cord injury) with subsequent damage to sensory, motor, and/or autonomic systems, resulting in permanent pain, numbness, and/or weakness of one or several areas of the body; allergic reactions; (i.e.: anaphylactic reaction); and/or death. Furthermore, the patient was informed of those risks and complications associated with the medications. These include, but are not limited to: allergic reactions (i.e.: anaphylactic or anaphylactoid reaction(s)); adrenal axis suppression; blood sugar elevation that in diabetics may result in ketoacidosis or comma; water retention that in patients with history of congestive heart failure may result in shortness of breath, pulmonary edema, and decompensation with resultant heart failure; weight gain; swelling or edema; medication-induced neural toxicity; particulate matter embolism and blood vessel occlusion with resultant organ, and/or nervous system infarction; and/or aseptic necrosis of one or more joints. Finally, the patient was informed that Medicine is not an exact science; therefore, there is also the possibility of unforeseen or unpredictable risks and/or possible complications that may result in a catastrophic outcome. The patient indicated having understood very clearly. We have given  the patient no guarantees and we have made no promises. Enough time was given to the patient to ask questions, all of which were answered to the patient's satisfaction. Amanda Soto has indicated that she wanted to continue with the procedure. Attestation: I, the ordering provider, attest that I have discussed with the patient the benefits, risks, side-effects, alternatives, likelihood of achieving goals, and potential problems during recovery for the procedure that I have provided informed consent. Date  Time: 07/05/2020 10:31 AM  Pre-Procedure Preparation:  Monitoring: As per clinic protocol. Respiration, ETCO2, SpO2, BP, heart rate and rhythm monitor placed and checked for adequate function Safety Precautions: Patient was assessed for positional comfort and pressure points before starting the procedure. Time-out: I initiated and conducted the "Time-out" before starting the procedure, as per protocol. The patient was asked to participate by confirming the accuracy of the "Time Out" information. Verification of the correct person, site, and procedure were performed and confirmed by me, the nursing staff, and the patient. "Time-out" conducted as per Joint Commission's Universal Protocol (UP.01.01.01). Time: 1055  Description of Procedure:          Laterality: Right Levels:  L2, L3, L4, L5, & S1 Medial Branch Level(s) Area Prepped: Posterior Lumbosacral Region DuraPrep (Iodine Povacrylex [0.7% available iodine] and Isopropyl Alcohol, 74% w/w) Safety Precautions: Aspiration looking for blood return was conducted prior to all injections. At no point did we inject any substances, as a needle was being advanced. Before injecting, the patient was told to immediately notify me if she was experiencing any new onset of "ringing in the ears, or metallic taste in the mouth". No attempts were made at seeking any paresthesias. Safe injection practices and needle disposal techniques used. Medications properly checked  for expiration dates. SDV (single dose vial) medications used. After the completion of the procedure, all disposable equipment used was discarded in the proper designated medical waste containers. Local Anesthesia: Protocol guidelines were followed. The  patient was positioned over the fluoroscopy table. The area was prepped in the usual manner. The time-out was completed. The target area was identified using fluoroscopy. A 12-in long, straight, sterile hemostat was used with fluoroscopic guidance to locate the targets for each level blocked. Once located, the skin was marked with an approved surgical skin marker. Once all sites were marked, the skin (epidermis, dermis, and hypodermis), as well as deeper tissues (fat, connective tissue and muscle) were infiltrated with a small amount of a short-acting local anesthetic, loaded on a 10cc syringe with a 25G, 1.5-in  Needle. An appropriate amount of time was allowed for local anesthetics to take effect before proceeding to the next step. Local Anesthetic: Lidocaine 2.0% The unused portion of the local anesthetic was discarded in the proper designated containers. Technical explanation of process:  L2 Medial Branch Nerve Block (MBB): The target area for the L2 medial branch is at the junction of the postero-lateral aspect of the superior articular process and the superior, posterior, and medial edge of the transverse process of L3. Under fluoroscopic guidance, a Quincke needle was inserted until contact was made with os over the superior postero-lateral aspect of the pedicular shadow (target area). After negative aspiration for blood, 0.5 mL of the nerve block solution was injected without difficulty or complication. The needle was removed intact. L3 Medial Branch Nerve Block (MBB): The target area for the L3 medial branch is at the junction of the postero-lateral aspect of the superior articular process and the superior, posterior, and medial edge of the transverse  process of L4. Under fluoroscopic guidance, a Quincke needle was inserted until contact was made with os over the superior postero-lateral aspect of the pedicular shadow (target area). After negative aspiration for blood, 0.5 mL of the nerve block solution was injected without difficulty or complication. The needle was removed intact. L4 Medial Branch Nerve Block (MBB): The target area for the L4 medial branch is at the junction of the postero-lateral aspect of the superior articular process and the superior, posterior, and medial edge of the transverse process of L5. Under fluoroscopic guidance, a Quincke needle was inserted until contact was made with os over the superior postero-lateral aspect of the pedicular shadow (target area). After negative aspiration for blood, 0.5 mL of the nerve block solution was injected without difficulty or complication. The needle was removed intact. L5 Medial Branch Nerve Block (MBB): The target area for the L5 medial branch is at the junction of the postero-lateral aspect of the superior articular process and the superior, posterior, and medial edge of the sacral ala. Under fluoroscopic guidance, a Quincke needle was inserted until contact was made with os over the superior postero-lateral aspect of the pedicular shadow (target area). After negative aspiration for blood, 0.5 mL of the nerve block solution was injected without difficulty or complication. The needle was removed intact. S1 Medial Branch Nerve Block (MBB): The target area for the S1 medial branch is at the posterior and inferior 6 o'clock position of the L5-S1 facet joint. Under fluoroscopic guidance, the Quincke needle inserted for the L5 MBB was redirected until contact was made with os over the inferior and postero aspect of the sacrum, at the 6 o' clock position under the L5-S1 facet joint (Target area). After negative aspiration for blood, 0.5 mL of the nerve block solution was injected without difficulty or  complication. The needle was removed intact.  Nerve block solution: 0.2% PF-Ropivacaine + Triamcinolone (40 mg/mL) diluted to a final  concentration of 4 mg of Triamcinolone/mL of Ropivacaine The unused portion of the solution was discarded in the proper designated containers. Procedural Needles: 22-gauge, 3.5-inch, Quincke needles used for all levels.  Once the entire procedure was completed, the treated area was cleaned, making sure to leave some of the prepping solution back to take advantage of its long term bactericidal properties.   Illustration of the posterior view of the lumbar spine and the posterior neural structures. Laminae of L2 through S1 are labeled. DPRL5, dorsal primary ramus of L5; DPRS1, dorsal primary ramus of S1; DPR3, dorsal primary ramus of L3; FJ, facet (zygapophyseal) joint L3-L4; I, inferior articular process of L4; LB1, lateral branch of dorsal primary ramus of L1; IAB, inferior articular branches from L3 medial branch (supplies L4-L5 facet joint); IBP, intermediate branch plexus; MB3, medial branch of dorsal primary ramus of L3; NR3, third lumbar nerve root; S, superior articular process of L5; SAB, superior articular branches from L4 (supplies L4-5 facet joint also); TP3, transverse process of L3.  Vitals:   07/05/20 1030 07/05/20 1052 07/05/20 1054 07/05/20 1100  BP: (!) 138/44 (!) 141/51 (!) 160/57 (!) 161/45  Pulse: (!) 49     Resp:  (!) 23 19 (!) 24  Temp: (!) 97 F (36.1 C)     SpO2: 99% 99% 98% 99%  Weight: 118 lb (53.5 kg)     Height: 4\' 10"  (1.473 m)        Start Time: 1055 hrs. End Time: 1100 hrs.  Imaging Guidance (Spinal):          Type of Imaging Technique: Fluoroscopy Guidance (Spinal) Indication(s): Assistance in needle guidance and placement for procedures requiring needle placement in or near specific anatomical locations not easily accessible without such assistance. Exposure Time: Please see nurses notes. Contrast: None used. Fluoroscopic  Guidance: I was personally present during the use of fluoroscopy. "Tunnel Vision Technique" used to obtain the best possible view of the target area. Parallax error corrected before commencing the procedure. "Direction-depth-direction" technique used to introduce the needle under continuous pulsed fluoroscopy. Once target was reached, antero-posterior, oblique, and lateral fluoroscopic projection used confirm needle placement in all planes. Images permanently stored in EMR. Interpretation: No contrast injected. I personally interpreted the imaging intraoperatively. Adequate needle placement confirmed in multiple planes. Permanent images saved into the patient's record.  Antibiotic Prophylaxis:   Anti-infectives (From admission, onward)   None     Indication(s): None identified  Post-operative Assessment:  Post-procedure Vital Signs:  Pulse/HCG Rate: (!) 49(!) 56 Temp: (!) 97 F (36.1 C) Resp: (!) 24 BP: (!) 161/45 SpO2: 99 %  EBL: None  Complications: No immediate post-treatment complications observed by team, or reported by patient.  Note: The patient tolerated the entire procedure well. A repeat set of vitals were taken after the procedure and the patient was kept under observation following institutional policy, for this type of procedure. Post-procedural neurological assessment was performed, showing return to baseline, prior to discharge. The patient was provided with post-procedure discharge instructions, including a section on how to identify potential problems. Should any problems arise concerning this procedure, the patient was given instructions to immediately contact us, at any time, without hesitation. In any case, we plan to contact the patient by telephone for a follow-up status report regarding this interventional procedure.  Comments:  No additional relevant information.  Plan of Care  Orders:  Orders Placed This Encounter  Procedures  . LUMBAR FACET(MEDIAL BRANCH  NERVE BLOCK) MBNB  Scheduling Instructions:     Procedure: Lumbar facet block (AKA.: Lumbosacral medial branch nerve block)     Side: Right-sided     Level: L3-4, L4-5, & L5-S1 Facets (L2, L3, L4, L5, & S1 Medial Branch Nerves)     Sedation: Patient's choice.     Timeframe: Today    Order Specific Question:   Where will this procedure be performed?    Answer:   [facility]  . [facility] C-ARM 1-60 MIN NO REPORT    Intraoperative interpretation by procedural physician at [facility].    Standing Status:   Standing    Number of Occurrences:   1    Order Specific Question:   Reason for exam:    Answer:   Assistance in needle guidance and placement for procedures requiring needle placement in or near specific anatomical locations not easily accessible without such assistance.  . Informed Consent Details: Physician/Practitioner Attestation; Transcribe to consent form and obtain patient signature    Nursing Order: Transcribe to consent form and obtain patient signature. Note: Always confirm laterality of pain with Ms. Fregeau, before procedure.    Order Specific Question:   Physician/Practitioner attestation of informed consent for procedure/surgical case    Answer:   I, the physician/practitioner, attest that I have discussed with the patient the benefits, risks, side effects, alternatives, likelihood of achieving goals and potential problems during recovery for the procedure that I have provided informed consent.    Order Specific Question:   Procedure    Answer:   Lumbar Facet Block  under fluoroscopic guidance    Order Specific Question:   Physician/Practitioner performing the procedure    Answer:   [place] Mielke A. Dossie Arbour MD    Order Specific Question:   Indication/Reason    Answer:   Low Back Pain, with our without leg pain, due to Facet Joint Arthralgia (Joint Pain) Spondylosis (Arthritis of the Spine), without myelopathy or radiculopathy (Nerve Damage).  . Care  order/instruction: Please confirm that the patient has stopped the Eliquis (Apixaban) x 3 days prior to procedure or surgery.    Please confirm that the patient has stopped the Eliquis (Apixaban) x 3 days prior to procedure or surgery.    Standing Status:   Standing    Number of Occurrences:   1  . Provide equipment / supplies at bedside    "Block Tray" (Disposable  single use) Needle type: SpinalSpinal Amount/quantity: 4 Size: Regular (3.5-inch) Gauge: 22G    Standing Status:   Standing    Number of Occurrences:   1    Order Specific Question:   Specify    Answer:   Block Tray  . Bleeding precautions    Standing Status:   Standing    Number of Occurrences:   1  . Latex precautions    Activate Latex-Free Protocol.    Standing Status:   Standing    Number of Occurrences:   1   Chronic Opioid Analgesic:  Tramadol 50 mg, 1 tablet PO q 6 hrs (200 mg/day of tramadol) MME/day: 20 mg/day.   Medications ordered for procedure: Meds ordered this encounter  Medications  . lidocaine (XYLOCAINE) 2 % (with pres) injection 400 mg  . DISCONTD: lactated ringers infusion 1,000 mL  . DISCONTD: midazolam (VERSED) 5 MG/5ML injection 1-2 mg    Make sure Flumazenil is available in the pyxis when using this medication. If oversedation occurs, administer 0.2 mg IV over 15 sec. If after 45 sec no  response, administer 0.2 mg again over 1 min; may repeat at 1 min intervals; not to exceed 4 doses (1 mg)  . DISCONTD: fentaNYL (SUBLIMAZE) injection 25-50 mcg    Make sure Narcan is available in the pyxis when using this medication. In the event of respiratory depression (RR< 8/min): Titrate NARCAN (naloxone) in increments of 0.1 to 0.2 mg IV at 2-3 minute intervals, until desired degree of reversal.  . ropivacaine (PF) 2 mg/mL (0.2%) (NAROPIN) injection 9 mL  . triamcinolone acetonide (KENALOG-40) injection 40 mg  . traMADol (ULTRAM) 50 MG tablet    Sig: Take 1 tablet (50 mg total) by mouth 4 (four) times  daily.    Dispense:  120 tablet    Refill:  2    Chronic Pain: STOP Act (Not applicable) Fill 1 day early if closed on refill date. Avoid benzodiazepines within 8 hours of opioids   Medications administered: We administered lidocaine, ropivacaine (PF) 2 mg/mL (0.2%), and triamcinolone acetonide.  See the medical record for exact dosing, route, and time of administration.  Follow-up plan:   Return in about 2 weeks (around 07/19/2020) for (VIrtual), (PP) Follow-up.       Considering: NOTE:Stop Eliquisfor 3 daysprior to aprocedure.    Palliative PRN treatment(s): Palliative/therapeutic caudal ESI #2 (100/100/90/90) Palliative/Therapeuticright lumbar facet RFA #3(last done 03/27/2018) (100/100/100/90) (100/90/0) Palliative/therapeutic right-sidedSIjoint RFA#1 (last done 03/27/2018) (100/90/0) Palliative right lumbar facet block#4(100/100/90/75) Palliative right sacroiliac joint block#4    Recent Visits Date Type Provider Dept  06/20/20 Telemedicine Delano Metz, MD [facility]  Showing recent visits within past 90 days and meeting all other requirements Today's Visits Date Type Provider Dept  07/05/20 Procedure visit Delano Metz, MD [facility]  Showing today's visits and meeting all other requirements Future Appointments Date Type Provider Dept  07/18/20 Appointment Delano Metz, MD [facility]  07/25/20 Appointment Delano Metz, MD [facility]  Showing future appointments within next 90 days and meeting all other requirements  Disposition: Discharge home  Discharge (Date  Time): 07/05/2020; 1109 hrs.   Primary Care Physician: Glori Luis, MD Location: [facility] Note by: Oswaldo Done, MD Date: 07/05/2020; Time: 11:24 AM  Disclaimer:  Medicine is not an Visual merchandiser. The only guarantee in medicine is that nothing is guaranteed. It is important to note  that the decision to proceed with this intervention was based on the information collected from the patient. The Data and conclusions were drawn from the patient's questionnaire, the interview, and the physical examination. Because the information was provided in large part by the patient, it cannot be guaranteed that it has not been purposely or unconsciously manipulated. Every effort has been made to obtain as much relevant data as possible for this evaluation. It is important to note that the conclusions that lead to this procedure are derived in large part from the available data. Always take into account that the treatment will also be dependent on availability of resources and existing treatment guidelines, considered by other Pain Management Practitioners as being common knowledge and practice, at the time of the intervention. For Medico-Legal purposes, it is also important to point out that variation in procedural techniques and pharmacological choices are the acceptable norm. The indications, contraindications, technique, and results of the above procedure should only be interpreted and judged by a Board-Certified Interventional Pain Specialist with extensive familiarity and expertise in the same exact procedure and technique.

## 2020-07-05 NOTE — Progress Notes (Signed)
Safety precautions to be maintained throughout the outpatient stay will include: orient to surroundings, keep bed in low position, maintain call bell within reach at all times, provide assistance with transfer out of bed and ambulation.  

## 2020-07-06 ENCOUNTER — Telehealth: Payer: Self-pay | Admitting: *Deleted

## 2020-07-06 NOTE — Telephone Encounter (Signed)
No problems post procecure.

## 2020-07-13 ENCOUNTER — Ambulatory Visit: Payer: Medicare Other | Admitting: Family Medicine

## 2020-07-14 ENCOUNTER — Encounter: Payer: Self-pay | Admitting: Pain Medicine

## 2020-07-17 NOTE — Progress Notes (Addendum)
Patient: Amanda Soto  Service Category: E/M  Provider: Gaspar Cola, MD  DOB: [birth year]  DOS: 07/18/2020  Location: Office  MRN: [id number]  Setting: Ambulatory outpatient  Referring Provider: [name], MD  Type: Established Patient  Specialty: Interventional Pain Management  PCP: [name], MD  Location: Remote location  Delivery: TeleHealth     Virtual Encounter - Pain Management PROVIDER NOTE: Information contained herein reflects review and annotations entered in association with encounter. Interpretation of such information and data should be left to medically-trained personnel. Information provided to patient can be located elsewhere in the medical record under "Patient Instructions". Document created using STT-dictation technology, any transcriptional errors that may result from process are unintentional.    Contact & Pharmacy Preferred: [name]: [phone] (home) Mobile: [phone] (mobile) E-mail: [name]  [id number] [place] [id number]: [phone]: [phone]  Pre-screening  Amanda Soto offered "in-person" vs "virtual" encounter. She indicated preferring virtual for this encounter.   Reason COVID-19*  Social distancing based on CDC and AMA recommendations.   I contacted PZOUA CAPORASOon 07/18/2020 via telephone.      I clearly identified myself as FGaspar Cola MD. I verified that I was speaking with the correct person using two identifiers (Name: Amanda Soto and date of birth: 803/20/1926.  Consent I sought verbal advanced consent from PKatheren Pullerfor virtual visit interactions. I informed Amanda Soto of possible security and privacy concerns, risks, and limitations associated with providing "not-in-person" medical evaluation and management services. I also informed Amanda Soto of the availability of "in-person" appointments. Finally, I informed  her that there would be a charge for the virtual visit and that she could be  personally, fully or partially, financially responsible for it. Ms. HMccarrollexpressed understanding and agreed to proceed.   Historic Elements   Ms. PDORRIE COCUZZAis a 87y.o. year old, female patient evaluated today after our last contact on 07/05/2020. Ms. HBartolotta has a past medical history of Acute postoperative pain (12/17/2016), Anemia (11/10/2015), Arthritis, Atrial fibrillation ([place], CHF (congestive heart failure) ([place], Chickenpox, Chronic abdominal pain (01/16/2014), Closed Colles' fracture (12/17/2016), Degenerative arthritis of hip (08/19/2014), Degenerative arthritis of lumbar spine (11/18/2013), Depression, Diverticulitis, GERD (gastroesophageal reflux disease), Heart murmur, Heart rate slow, Hyperlipidemia, Hypertension, Hypothyroid, Neuritis or radiculitis due to rupture of lumbar intervertebral disc (11/18/2013), Skin cancer (2016 ?), Symptomatic anemia (11/09/2015), and Trochanteric bursitis of right hip (11/18/2013). She also  has a past surgical history that includes Cataract extraction; Partial hysterectomy; Appendectomy; and Tonsillectomy. Ms. HVelosohas a current medication list which includes the following prescription(s): acetaminophen, vitamin d3, eliquis, fluticasone, furosemide, multiple vitamins-minerals, omeprazole, sotalol, synthroid, tramadol, vitamin b-12, and loratadine. She  reports that she has never smoked. She has never used smokeless tobacco. She reports current alcohol use of about 1.0 standard drink of alcohol per week. She reports that she does not use drugs. Ms. HYowis allergic to hydrocodone, ciprofloxacin, and latex.   HPI  Today, she is being contacted for a post-procedure assessment.  The patient indicates having attained 100% relief of the pain after the right-sided lumbar facet block.  However even though we did the procedure without any sedation, she does refer having felt "a little out of it" for  the next 24 hours.  I have explained to the patient that this could be the effect of the local anesthetic in which  case if it was to become a problem we would need to lower the total amount that we provide her during the blocks.  At age 87, I would not expect her to clear the medicines as quickly as somebody younger.  The patient indicates doing well with the current medication regimen. No adverse reactions or side effects reported to the medications.  Today she was reminded that she has enough refills of her tramadol to last until April.  RTCB: 10/25/2020  Post-Procedure Evaluation  Procedure (07/05/2020): Palliative right lumbar facet block #4 under fluoroscopic guidance, no sedation Pre-procedure pain level: 7/10 Post-procedure: 0/10 (100% relief)  Sedation: Sedation provided.  Effectiveness during initial hour after procedure(Ultra-Short Term Relief): 100 %.  Local anesthetic used: Long-acting (4-6 hours) Effectiveness: Defined as any analgesic benefit obtained secondary to the administration of local anesthetics. This carries significant diagnostic value as to the etiological location, or anatomical origin, of the pain. Duration of benefit is expected to coincide with the duration of the local anesthetic used.  Effectiveness during initial 4-6 hours after procedure(Short-Term Relief): 100 %.  Long-term benefit: Defined as any relief past the pharmacologic duration of the local anesthetics.  Effectiveness past the initial 6 hours after procedure(Long-Term Relief): 100 %.  Current benefits: Defined as benefit that persist at this time.   Analgesia:  90-100% better Function: Amanda Soto reports improvement in function ROM: Amanda Soto reports improvement in ROM  Pharmacotherapy Assessment  Analgesic: Tramadol 50 mg, 1 tablet PO q 6 hrs (200 mg/day of tramadol) MME/day: 20 mg/day.   Monitoring: [place] PMP: PDMP reviewed during this encounter.       Pharmacotherapy: No side-effects or adverse  reactions reported. Compliance: No problems identified. Effectiveness: Clinically acceptable. Plan: Refer to "POC".  UDS: No results found for: SUMMARY  Laboratory Chemistry Profile   Renal Lab Results  Component Value Date   BUN 14 05/11/2020   CREATININE 0.52 05/11/2020   LABCREA 90 01/11/2020   BCR 24 (H) 04/26/2020   GFR 80.19 05/11/2020   GFRAA >60 12/18/2019   GFRNONAA >60 12/18/2019     Hepatic Lab Results  Component Value Date   AST 19 04/26/2020   ALT 9 04/26/2020   ALBUMIN 4.2 04/26/2020   ALKPHOS 55 04/26/2020   LIPASE 181 11/02/2013     Electrolytes Lab Results  Component Value Date   NA 134 (L) 05/11/2020   K 4.0 05/11/2020   CL 97 05/11/2020   CALCIUM 9.3 05/11/2020   MG 2.0 03/30/2019   PHOS 3.1 04/26/2020     Bone Lab Results  Component Value Date   VD25OH 28.73 (L) 01/11/2020   25OHVITD1 14 (L) 03/30/2019   25OHVITD2 <1.0 03/30/2019   25OHVITD3 14 03/30/2019     Inflammation (CRP: Acute Phase) (ESR: Chronic Phase) Lab Results  Component Value Date   CRP <1 03/30/2019   ESRSEDRATE 50 (H) 03/30/2019       Note: Above Lab results reviewed.  Imaging  DG PAIN CLINIC C-ARM 1-60 MIN NO REPORT Fluoro was used, but no Radiologist interpretation will be provided.  Please refer to "NOTES" tab for provider progress note.  Assessment  The primary encounter diagnosis was Chronic pain syndrome. Diagnoses of Lumbar facet syndrome (Right), Lumbar facet arthropathy, Grade 1 Anterolisthesis of L4 over L5 (5 mm), Chronic low back pain (1ry area of Pain) (Right), and Vertebral fracture, osteoporotic, sequela were also pertinent to this visit.  Plan of Care  Problem-specific:  No problem-specific Assessment & Plan notes found for  this encounter.  Ms. RANE DUMM has a current medication list which includes the following long-term medication(s): eliquis, fluticasone, furosemide, loratadine, sotalol, synthroid, and tramadol.  Pharmacotherapy  (Medications Ordered): No orders of the defined types were placed in this encounter.  Orders:  No orders of the defined types were placed in this encounter.  Follow-up plan:   Return in about 3 months (around 10/25/2020) for (F2F), (Med Mgmt).      Considering: NOTE:Stop Eliquisfor 3 daysprior to aprocedure.    Palliative PRN treatment(s): Palliative/therapeutic caudal ESI #2 (100/100/90/90) Palliative/Therapeuticright lumbar facet RFA #3(last done 03/27/2018) (100/100/100/90) (100/90/0) Palliative/therapeutic right-sidedSIjoint RFA#1 (last done 03/27/2018) (100/90/0) Palliative right lumbar facet block#4(100/100/90/75) Palliative right sacroiliac joint block#4     Recent Visits Date Type Provider Dept  07/18/20 Telemedicine Milinda Pointer, MD [facility]  07/05/20 Procedure visit Milinda Pointer, MD [facility]  06/20/20 Telemedicine Milinda Pointer, MD [facility]  Showing recent visits within past 90 days and meeting all other requirements Future Appointments Date Type Provider Dept  10/24/20 Appointment Milinda Pointer, MD [facility]  Showing future appointments within next 90 days and meeting all other requirements  I discussed the assessment and treatment plan with the patient. The patient was provided an opportunity to ask questions and all were answered. The patient agreed with the plan and demonstrated an understanding of the instructions.  Patient advised to call back or seek an in-person evaluation if the symptoms or condition worsens.  Duration of encounter: 14 minutes.  Note by: Gaspar Cola, MD Date: 07/18/2020; Time: 12:11 PM

## 2020-07-18 ENCOUNTER — Ambulatory Visit: Payer: Medicare Other | Admitting: Pain Medicine

## 2020-07-18 ENCOUNTER — Ambulatory Visit: Payer: Medicare Other | Attending: Pain Medicine | Admitting: Pain Medicine

## 2020-07-18 ENCOUNTER — Other Ambulatory Visit: Payer: Self-pay

## 2020-07-18 DIAGNOSIS — G894 Chronic pain syndrome: Secondary | ICD-10-CM

## 2020-07-18 DIAGNOSIS — G8929 Other chronic pain: Secondary | ICD-10-CM | POA: Diagnosis not present

## 2020-07-18 DIAGNOSIS — M8008XS Age-related osteoporosis with current pathological fracture, vertebra(e), sequela: Secondary | ICD-10-CM | POA: Diagnosis not present

## 2020-07-18 DIAGNOSIS — M545 Low back pain, unspecified: Secondary | ICD-10-CM | POA: Diagnosis not present

## 2020-07-18 DIAGNOSIS — M47816 Spondylosis without myelopathy or radiculopathy, lumbar region: Secondary | ICD-10-CM | POA: Diagnosis not present

## 2020-07-18 DIAGNOSIS — M4310 Spondylolisthesis, site unspecified: Secondary | ICD-10-CM

## 2020-07-19 ENCOUNTER — Other Ambulatory Visit: Payer: Self-pay

## 2020-07-21 NOTE — Progress Notes (Deleted)
Wrong appointment 

## 2020-07-25 ENCOUNTER — Encounter: Payer: Medicare Other | Admitting: Pain Medicine

## 2020-07-26 ENCOUNTER — Ambulatory Visit: Payer: Medicare Other | Admitting: Family Medicine

## 2020-07-26 ENCOUNTER — Other Ambulatory Visit: Payer: Self-pay

## 2020-07-28 ENCOUNTER — Telehealth: Payer: Self-pay

## 2020-07-28 ENCOUNTER — Telehealth (INDEPENDENT_AMBULATORY_CARE_PROVIDER_SITE_OTHER): Payer: Medicare Other | Admitting: Family Medicine

## 2020-07-28 ENCOUNTER — Encounter: Payer: Self-pay | Admitting: Family Medicine

## 2020-07-28 DIAGNOSIS — F3341 Major depressive disorder, recurrent, in partial remission: Secondary | ICD-10-CM | POA: Diagnosis not present

## 2020-07-28 DIAGNOSIS — R0982 Postnasal drip: Secondary | ICD-10-CM

## 2020-07-28 DIAGNOSIS — R5382 Chronic fatigue, unspecified: Secondary | ICD-10-CM | POA: Diagnosis not present

## 2020-07-28 MED ORDER — LORATADINE 10 MG PO TABS: 10.0000 mg | ORAL_TABLET | Freq: Every day | ORAL | 0 refills | Status: DC

## 2020-07-28 NOTE — Telephone Encounter (Signed)
Attempted to contact patient 3 times and prepare them of their video visit with Dr. Caryl Bis at 12pm on 07/28/20. Phone continued to ring and could not leave a voicemail to contact our office for their appointment.

## 2020-07-28 NOTE — Progress Notes (Signed)
Virtual Visit via telephone Note  This visit type was conducted due to national recommendations for restrictions regarding the COVID-19 pandemic (e.g. social distancing).  This format is felt to be most appropriate for this patient at this time.  All issues noted in this document were discussed and addressed.  No physical exam was performed (except for noted visual exam findings with Video Visits).   I connected with Amanda Soto today at 12:00 PM EST by telephone and verified that I am speaking with the correct person using two identifiers. Location patient: home Location provider: home office Persons participating in the virtual visit: patient, provider  I discussed the limitations, risks, security and privacy concerns of performing an evaluation and management service by telephone and the availability of in person appointments. I also discussed with the patient that there may be a patient responsible charge related to this service. The patient expressed understanding and agreed to proceed.  Interactive audio and video telecommunications were attempted between this provider and patient, however failed, due to patient having technical difficulties OR patient did not have access to video capability.  We continued and completed visit with audio only.   Reason for visit: same day visit  HPI: Post nasal drip: Patient notes this started about 7 days ago.  She notes it feels a little swollen underneath her left eye though she denies any sinus or chest congestion.  She has had no cough.  She has no sore throat.  No fever.  No body aches.  No shortness of breath.  No taste or smell disturbances.  She is not blowing anything out of her nose.  She notes chronic weakness and decreased energy which has been evaluated and worked up several times previously.  She notes no Covid exposures and she has not left the house in quite some time.  She is fully vaccinated and has had her Covid booster.  She has not  taken any medications for this.  She does report occasional symptoms of depression though declines any medication or therapy for this.  The patient feels as though she has a sinus infection.   ROS: See pertinent positives and negatives per HPI.  Past Medical History:  Diagnosis Date  . Acute postoperative pain 12/17/2016  . Anemia 11/10/2015  . Arthritis   . Atrial fibrillation ([place])   . CHF (congestive heart failure) ([place])   . Chickenpox   . Chronic abdominal pain 01/16/2014  . Closed Colles' fracture 12/17/2016  . Degenerative arthritis of hip 08/19/2014  . Degenerative arthritis of lumbar spine 11/18/2013  . Depression   . Diverticulitis   . GERD (gastroesophageal reflux disease)   . Heart murmur   . Heart rate slow   . Hyperlipidemia   . Hypertension   . Hypothyroid   . Neuritis or radiculitis due to rupture of lumbar intervertebral disc 11/18/2013  . Skin cancer 2016 ?   left lower leg  . Symptomatic anemia 11/09/2015  . Trochanteric bursitis of right hip 11/18/2013    Past Surgical History:  Procedure Laterality Date  . APPENDECTOMY    . CATARACT EXTRACTION    . PARTIAL HYSTERECTOMY    . TONSILLECTOMY      Family History  Problem Relation Age of Onset  . Stroke Maternal Grandmother   . Breast cancer Mother   . Breast cancer Sister   . Stroke Sister   . Multiple sclerosis Sister   . Heart disease Other        Parent, grandparent,  sons  . Hypertension Other        Parent, grandparent    SOCIAL HX: Non-smoker   Current Outpatient Medications:  .  acetaminophen (TYLENOL) 325 MG tablet, Take 500 mg by mouth every 6 (six) hours as needed for moderate pain, fever or headache. , Disp: , Rfl:  .  Cholecalciferol (VITAMIN D3) 50 MCG (2000 UT) CHEW, Chew 1 tablet by mouth daily., Disp: , Rfl:  .  ELIQUIS 2.5 MG TABS tablet, TAKE 1 TABLET BY MOUTH TWICE A DAY, Disp: 60 tablet, Rfl: 11 .  fluticasone (FLONASE) 50 MCG/ACT nasal spray, SPRAY 2 SPRAYS INTO EACH NOSTRIL EVERY  DAY, Disp: , Rfl:  .  furosemide (LASIX) 20 MG tablet, TAKE 1 TABLET BY MOUTH EVERY DAY, Disp: 90 tablet, Rfl: 1 .  loratadine (CLARITIN) 10 MG tablet, Take 1 tablet (10 mg total) by mouth daily., Disp: 30 tablet, Rfl: 0 .  Multiple Vitamins-Minerals (PRESERVISION AREDS 2 PO), Take by mouth in the morning and at bedtime., Disp: , Rfl:  .  omeprazole (PRILOSEC) 40 MG capsule, TAKE 1 CAPSULE BY MOUTH EVERY DAY 15-20 MINUTES BEFORE MEALS, Disp: , Rfl:  .  sotalol (BETAPACE) 80 MG tablet, Take 40 mg by mouth 2 (two) times daily. , Disp: , Rfl:  .  SYNTHROID 50 MCG tablet, TAKE 1 TABLET BY MOUTH EVERY DAY BEFORE BREAKFAST, Disp: 90 tablet, Rfl: 1 .  traMADol (ULTRAM) 50 MG tablet, Take 1 tablet (50 mg total) by mouth 4 (four) times daily., Disp: 120 tablet, Rfl: 2 .  vitamin B-12 (CYANOCOBALAMIN) 1000 MCG tablet, Take by mouth., Disp: , Rfl:   EXAM:  VITALS per patient if applicable:  GENERAL: alert, oriented, appears well and in no acute distress  HEENT: atraumatic, conjunttiva clear, no obvious abnormalities on inspection of external nose and ears  NECK: normal movements of the head and neck  LUNGS: on inspection no signs of respiratory distress, breathing rate appears normal, no obvious gross SOB, gasping or wheezing  CV: no obvious cyanosis  MS: moves all visible extremities without noticeable abnormality  PSYCH/NEURO: pleasant and cooperative, no obvious depression or anxiety, speech and thought processing grossly intact  ASSESSMENT AND PLAN:  Discussed the following assessment and plan:  Problem List Items Addressed This Visit    Chronic fatigue    Depression may be playing some role.  Prior work-up has been unremarkable for cause.      Depression, major, recurrent, in partial remission ([place])    Patient with mild intermittent symptoms.  She declines any treatment for this.  I discussed that this could be contributing to her lack of energy though she continues to decline  treatment.      Postnasal drip    I suspect this is related to allergic rhinitis.  I did discuss the potential that this could be related to COVID-19.  She declines a COVID-19 test noting she has not been out of the house and has not come in contact with anybody who has had Covid.  I discussed that this does not seem consistent with a bacterial sinusitis given the short duration of symptoms and given the description of symptoms.  I advised that an antibiotic would not be indicated that it would likely just cause side effects.  I encouraged her to use Flonase and Claritin to help with her symptoms.  She will contact us if her symptoms worsen or do not improve.          I discussed the assessment  and treatment plan with the patient. The patient was provided an opportunity to ask questions and all were answered. The patient agreed with the plan and demonstrated an understanding of the instructions.   The patient was advised to call back or seek an in-person evaluation if the symptoms worsen or if the condition fails to improve as anticipated.  I provided 11 minutes of non-face-to-face time during this encounter.   [name], MD

## 2020-07-28 NOTE — Assessment & Plan Note (Signed)
Patient with mild intermittent symptoms.  She declines any treatment for this.  I discussed that this could be contributing to her lack of energy though she continues to decline treatment.

## 2020-07-28 NOTE — Assessment & Plan Note (Signed)
Depression may be playing some role.  Prior work-up has been unremarkable for cause.

## 2020-07-28 NOTE — Assessment & Plan Note (Signed)
I suspect this is related to allergic rhinitis.  I did discuss the potential that this could be related to COVID-19.  She declines a COVID-19 test noting she has not been out of the house and has not come in contact with anybody who has had Covid.  I discussed that this does not seem consistent with a bacterial sinusitis given the short duration of symptoms and given the description of symptoms.  I advised that an antibiotic would not be indicated that it would likely just cause side effects.  I encouraged her to use Flonase and Claritin to help with her symptoms.  She will contact us if her symptoms worsen or do not improve.

## 2020-08-19 ENCOUNTER — Ambulatory Visit
Admission: RE | Admit: 2020-08-19 | Discharge: 2020-08-19 | Disposition: A | Discharge status: Home / Self Care | Payer: Medicare Other | Source: Ambulatory Visit | Attending: Internal Medicine | Admitting: Internal Medicine

## 2020-08-19 ENCOUNTER — Encounter: Payer: Self-pay | Admitting: Internal Medicine

## 2020-08-19 ENCOUNTER — Other Ambulatory Visit: Payer: Self-pay

## 2020-08-19 ENCOUNTER — Ambulatory Visit (INDEPENDENT_AMBULATORY_CARE_PROVIDER_SITE_OTHER): Payer: Medicare Other | Admitting: Internal Medicine

## 2020-08-19 VITALS — BP 122/78 | HR 50 | Temp 97.3°F | Ht <= 58 in | Wt 113.0 lb

## 2020-08-19 DIAGNOSIS — R2241 Localized swelling, mass and lump, right lower limb: Secondary | ICD-10-CM | POA: Diagnosis not present

## 2020-08-19 DIAGNOSIS — M545 Low back pain, unspecified: Secondary | ICD-10-CM | POA: Diagnosis not present

## 2020-08-19 DIAGNOSIS — M25551 Pain in right hip: Secondary | ICD-10-CM | POA: Insufficient documentation

## 2020-08-19 NOTE — Progress Notes (Signed)
Chief Complaint  Patient presents with  . Cyst   F/u  1. Right thigh medium fluid collection swollen painful not red no drainage or discoloration she had epidural injection 07/05/20 with Dr. Consuela [facility] pain [facility] in low back since then this developed and c/o 5/10 right inner thigh pain she has tramadol 50 mg qd   Review of Systems  Musculoskeletal: Positive for back pain and joint pain.   Past Medical History:  Diagnosis Date  . Acute postoperative pain 12/17/2016  . Anemia 11/10/2015  . Arthritis   . Atrial fibrillation ([place])   . CHF (congestive heart failure) ([place])   . Chickenpox   . Chronic abdominal pain 01/16/2014  . Closed Colles' fracture 12/17/2016  . Degenerative arthritis of hip 08/19/2014  . Degenerative arthritis of lumbar spine 11/18/2013  . Depression   . Diverticulitis   . GERD (gastroesophageal reflux disease)   . Heart murmur   . Heart rate slow   . Hyperlipidemia   . Hypertension   . Hypothyroid   . Neuritis or radiculitis due to rupture of lumbar intervertebral disc 11/18/2013  . Skin cancer 2016 ?   left lower leg  . Symptomatic anemia 11/09/2015  . Trochanteric bursitis of right hip 11/18/2013   Past Surgical History:  Procedure Laterality Date  . APPENDECTOMY    . CATARACT EXTRACTION    . PARTIAL HYSTERECTOMY    . TONSILLECTOMY     Family History  Problem Relation Age of Onset  . Stroke Maternal Grandmother   . Breast cancer Mother   . Breast cancer Sister   . Stroke Sister   . Multiple sclerosis Sister   . Heart disease Other        Parent, grandparent, sons  . Hypertension Other        Parent, grandparent   Social History   Socioeconomic History  . Marital status: Widowed    Spouse name: Not on file  . Number of children: Not on file  . Years of education: Not on file  . Highest education level: Not on file  Occupational History  . Not on file  Tobacco Use  . Smoking status: Never Smoker  . Smokeless tobacco: Never Used  Substance and  Sexual Activity  . Alcohol use: Yes    Alcohol/week: 1.0 standard drink    Types: 1 Glasses of wine per week    Comment: rare  . Drug use: No  . Sexual activity: Never  Other Topics Concern  . Not on file  Social History Narrative  . Not on file   Social Determinants of Health   Financial Resource Strain: Not on file  Food Insecurity: Not on file  Transportation Needs: Not on file  Physical Activity: Not on file  Stress: Not on file  Social Connections: Not on file  Intimate Partner Violence: Not on file   Current Meds  Medication Sig  . acetaminophen (TYLENOL) 325 MG tablet Take 500 mg by mouth every 6 (six) hours as needed for moderate pain, fever or headache.   . Cholecalciferol (VITAMIN D3) 50 MCG (2000 UT) CHEW Chew 1 tablet by mouth daily.  Marland Kitchen ELIQUIS 2.5 MG TABS tablet TAKE 1 TABLET BY MOUTH TWICE A DAY  . fluticasone (FLONASE) 50 MCG/ACT nasal spray SPRAY 2 SPRAYS INTO EACH NOSTRIL EVERY DAY  . furosemide (LASIX) 20 MG tablet TAKE 1 TABLET BY MOUTH EVERY DAY  . Multiple Vitamins-Minerals (PRESERVISION AREDS 2 PO) Take by mouth in the morning and at bedtime.  Marland Kitchen  omeprazole (PRILOSEC) 40 MG capsule TAKE 1 CAPSULE BY MOUTH EVERY DAY 15-20 MINUTES BEFORE MEALS  . sotalol (BETAPACE) 80 MG tablet Take 40 mg by mouth 2 (two) times daily.   Marland Kitchen SYNTHROID 50 MCG tablet TAKE 1 TABLET BY MOUTH EVERY DAY BEFORE BREAKFAST  . traMADol (ULTRAM) 50 MG tablet Take 1 tablet (50 mg total) by mouth 4 (four) times daily.  . vitamin B-12 (CYANOCOBALAMIN) 1000 MCG tablet Take by mouth.   Allergies  Allergen Reactions  . Hydrocodone Other (See Comments)    "Funny feeling"  . Ciprofloxacin Nausea And Vomiting  . Latex Rash   No results found for this or any previous visit (from the past 2160 hour(s)). Objective  Body mass index is 28.28 kg/m. Wt Readings from Last 3 Encounters:  08/19/20 113 lb (51.3 kg)  07/28/20 115 lb (52.2 kg)  07/05/20 118 lb (53.5 kg)   Temp Readings from Last 3  Encounters:  08/19/20 (!) 97.3 F (36.3 C) (Oral)  07/05/20 (!) 97 F (36.1 C)  02/04/20 98.2 F (36.8 C)   BP Readings from Last 3 Encounters:  08/19/20 122/78  07/05/20 (!) 161/45  02/04/20 (!) 96/52   Pulse Readings from Last 3 Encounters:  08/19/20 (!) 50  07/05/20 (!) 49  02/04/20 (!) 106    Physical Exam Vitals and nursing note reviewed.  Constitutional:      Appearance: Normal appearance. She is well-developed and well-groomed.  HENT:     Head: Normocephalic and atraumatic.  Cardiovascular:     Rate and Rhythm: Normal rate and regular rhythm.     Heart sounds: Normal heart sounds. No murmur heard.   Pulmonary:     Effort: Pulmonary effort is normal.     Breath sounds: Normal breath sounds.  Skin:    General: Skin is warm and dry.       Neurological:     General: No focal deficit present.     Mental Status: She is alert and oriented to person, place, and time. Mental status is at baseline.     Gait: Gait normal.  Psychiatric:        Attention and Perception: Attention and perception normal.        Mood and Affect: Mood and affect normal.        Speech: Speech normal.        Behavior: Behavior normal. Behavior is cooperative.        Thought Content: Thought content normal.        Cognition and Memory: Cognition and memory normal.        Judgment: Judgment normal.     Assessment  Plan  Mass of right thigh - Plan: US SOFT TISSUE LOWER EXTREMITY LIMITED RIGHT (NON-VASCULAR),  Right hip pain - Plan: DG Hip Unilat W OR W/O Pelvis 2-3 Views Right  Acute bilateral low back pain, unspecified whether sciatica present  F/u with pain clinic   Provider: Dr. Olivia Mackie [facility]

## 2020-08-19 NOTE — Patient Instructions (Signed)
Lidocaine or salonpas pain patch ? Do these contain latex but they over the counter for pain

## 2020-08-19 NOTE — Addendum Note (Signed)
Addended by: Orland Mustard on: 08/19/2020 02:51 PM   Modules accepted: Orders

## 2020-08-22 DIAGNOSIS — R002 Palpitations: Secondary | ICD-10-CM | POA: Diagnosis not present

## 2020-08-22 DIAGNOSIS — I480 Paroxysmal atrial fibrillation: Secondary | ICD-10-CM | POA: Diagnosis not present

## 2020-08-22 DIAGNOSIS — I495 Sick sinus syndrome: Secondary | ICD-10-CM | POA: Diagnosis not present

## 2020-08-22 DIAGNOSIS — R5382 Chronic fatigue, unspecified: Secondary | ICD-10-CM | POA: Diagnosis not present

## 2020-08-22 DIAGNOSIS — I10 Essential (primary) hypertension: Secondary | ICD-10-CM | POA: Diagnosis not present

## 2020-08-22 DIAGNOSIS — R011 Cardiac murmur, unspecified: Secondary | ICD-10-CM | POA: Diagnosis not present

## 2020-08-22 DIAGNOSIS — R531 Weakness: Secondary | ICD-10-CM | POA: Diagnosis not present

## 2020-08-23 ENCOUNTER — Other Ambulatory Visit: Payer: Self-pay | Admitting: Family Medicine

## 2020-08-26 NOTE — Telephone Encounter (Addendum)
Patient confused about injection says she will discuss with provider at upcoming appointment.

## 2020-08-29 ENCOUNTER — Encounter: Payer: Self-pay | Admitting: Pain Medicine

## 2020-08-30 ENCOUNTER — Other Ambulatory Visit: Payer: Self-pay

## 2020-08-30 ENCOUNTER — Ambulatory Visit: Payer: Medicare Other | Attending: Pain Medicine | Admitting: Pain Medicine

## 2020-08-30 DIAGNOSIS — M4310 Spondylolisthesis, site unspecified: Secondary | ICD-10-CM | POA: Diagnosis not present

## 2020-08-30 DIAGNOSIS — M47816 Spondylosis without myelopathy or radiculopathy, lumbar region: Secondary | ICD-10-CM | POA: Diagnosis not present

## 2020-08-30 DIAGNOSIS — S3210XS Unspecified fracture of sacrum, sequela: Secondary | ICD-10-CM | POA: Diagnosis not present

## 2020-08-30 DIAGNOSIS — G8929 Other chronic pain: Secondary | ICD-10-CM | POA: Diagnosis not present

## 2020-08-30 DIAGNOSIS — Z7901 Long term (current) use of anticoagulants: Secondary | ICD-10-CM | POA: Diagnosis not present

## 2020-08-30 DIAGNOSIS — M533 Sacrococcygeal disorders, not elsewhere classified: Secondary | ICD-10-CM | POA: Diagnosis not present

## 2020-08-30 DIAGNOSIS — M545 Low back pain, unspecified: Secondary | ICD-10-CM

## 2020-08-30 DIAGNOSIS — M8088XA Other osteoporosis with current pathological fracture, vertebra(e), initial encounter for fracture: Secondary | ICD-10-CM | POA: Diagnosis not present

## 2020-08-30 NOTE — Progress Notes (Signed)
Patient: Amanda Soto  Service Category: E/M  Provider: Gaspar Cola, MD  DOB: 15-May-1925  DOS: 08/30/2020  Location: Office  MRN: [id number]  Setting: Ambulatory outpatient  Referring Provider: [name], MD  Type: Established Patient  Specialty: Interventional Pain Management  PCP: [name], MD  Location: Remote location  Delivery: TeleHealth     Virtual Encounter - Pain Management PROVIDER NOTE: Information contained herein reflects review and annotations entered in association with encounter. Interpretation of such information and data should be left to medically-trained personnel. Information provided to patient can be located elsewhere in the medical record under "Patient Instructions". Document created using STT-dictation technology, any transcriptional errors that may result from process are unintentional.    Contact & Pharmacy Preferred: [place]: [phone] (home) Mobile: [phone] (mobile) E-mail: [name]  [id number] [id number]: [phone]: [phone]  Pre-screening  Amanda Soto "in-person" vs "virtual" encounter. She indicated preferring virtual for this encounter.   Reason COVID-19*  Social distancing based on CDC and AMA recommendations.   I contacted Amanda PELLUMon 08/30/2020 via telephone.      I clearly identified myself as FGaspar Cola MD. I verified that I was speaking with the correct person using two identifiers (Name: Amanda Soto and date of birth: 85-May-1926.  Consent I sought verbal advanced consent from Amanda Pullerfor virtual visit interactions. I informed Amanda Soto of possible security and privacy concerns, risks, and limitations associated with providing "not-in-person" medical evaluation and management services. I also informed Amanda Soto of the availability of "in-person" appointments. Finally, I informed  her that there would be a charge for the virtual visit and that she could be  personally, fully or partially, financially responsible for it. Ms. HHoeltingexpressed understanding and agreed to proceed.   Historic Elements   Ms. PZAYLAH BLECHAis a 87y.o. year old, female patient evaluated today after our last contact on 07/05/2020. Ms. HDoig has a past medical history of Acute postoperative pain (12/17/2016), Anemia (11/10/2015), Arthritis, Atrial fibrillation ([place], CHF (congestive heart failure) ([place], Chickenpox, Chronic abdominal pain (01/16/2014), Closed Colles' fracture (12/17/2016), Degenerative arthritis of hip (08/19/2014), Degenerative arthritis of lumbar spine (11/18/2013), Depression, Diverticulitis, GERD (gastroesophageal reflux disease), Heart murmur, Heart rate slow, Hyperlipidemia, Hypertension, Hypothyroid, Neuritis or radiculitis due to rupture of lumbar intervertebral disc (11/18/2013), Skin cancer (2016 ?), Symptomatic anemia (11/09/2015), and Trochanteric bursitis of right hip (11/18/2013). She also  has a past surgical history that includes Cataract extraction; Partial hysterectomy; Appendectomy; and Tonsillectomy. Ms. HDurosshas a current medication list which includes the following prescription(s): acetaminophen, vitamin d3, eliquis, fluticasone, furosemide, multiple vitamins-minerals, omeprazole, sotalol, synthroid, tramadol, and vitamin b-12. She  reports that she has never smoked. She has never used smokeless tobacco. She reports current alcohol use of about 1.0 standard drink of alcohol per week. She reports that she does not use drugs. Ms. HHoudeis allergic to hydrocodone, ciprofloxacin, and latex.   HPI  Today, she is being contacted for worsening of previously known (established) problem.  According to the patient, her right-sided low back pain has returned.  The patient will like to have this treated as soon as possible.  The last time that I did a Lumbar facet block for her was on 07/05/2020.   Lately we have had to do these a little bit more often.  I do believe that the  radiofrequencies provide her with longer lasting benefit.  However, in her case in particular we have proven that she needs to have both the SI and the facet RFA done at the same time in order for it to be effective.  Since the patient wants something done as soon as possible and she will need to stop her Eliquis for 3 days, I will not be able to get her in this week and next week I am out of vacation.  Therefore, I have Soto her the alternative to have it done with Dr. Gillis Santa.  She has indicated that this would be fine.  I have spoken to Dr. Holley Raring and he has agreed to do the right SI and facet joint injection for her.  I will follow-up with her as a virtual visit 2 weeks later and we will talk about the possibility of repeating that radiofrequency ablation.  Pharmacotherapy Assessment  Analgesic: Tramadol 50 mg, 1 tablet PO q 6 hrs (200 mg/day of tramadol) MME/day: 20 mg/day.   Monitoring: [place] PMP: PDMP reviewed during this encounter.       Pharmacotherapy: No side-effects or adverse reactions reported. Compliance: No problems identified. Effectiveness: Clinically acceptable. Plan: Refer to "POC".  UDS: No results found for: SUMMARY  Laboratory Chemistry Profile   Renal Lab Results  Component Value Date   BUN 14 05/11/2020   CREATININE 0.52 05/11/2020   LABCREA 90 01/11/2020   BCR 24 (H) 04/26/2020   GFR 80.19 05/11/2020   GFRAA >60 12/18/2019   GFRNONAA >60 12/18/2019     Hepatic Lab Results  Component Value Date   AST 19 04/26/2020   ALT 9 04/26/2020   ALBUMIN 4.2 04/26/2020   ALKPHOS 55 04/26/2020   LIPASE 181 11/02/2013     Electrolytes Lab Results  Component Value Date   NA 134 (L) 05/11/2020   K 4.0 05/11/2020   CL 97 05/11/2020   CALCIUM 9.3 05/11/2020   MG 2.0 03/30/2019   PHOS 3.1 04/26/2020     Bone Lab Results  Component Value Date   VD25OH 28.73 (L) 01/11/2020    25OHVITD1 14 (L) 03/30/2019   25OHVITD2 <1.0 03/30/2019   25OHVITD3 14 03/30/2019     Inflammation (CRP: Acute Phase) (ESR: Chronic Phase) Lab Results  Component Value Date   CRP <1 03/30/2019   ESRSEDRATE 50 (H) 03/30/2019       Note: Above Lab results reviewed.  Imaging  DG Hip Unilat W OR W/O Pelvis 2-3 Views Right CLINICAL DATA:  Worsening right hip pain  EXAM: DG HIP (WITH OR WITHOUT PELVIS) 2-3V RIGHT  COMPARISON:  04/26/2020  FINDINGS: Marked osteopenia. Degenerative changes of the lower lumbar spine, SI joints and both hips. Bony pelvis and hips appear symmetric and intact. No displaced fracture or malalignment of the right hip. No other acute osseous finding. Aortoiliac and femoral atherosclerosis noted. Nonobstructive bowel gas pattern.  IMPRESSION: No acute finding by plain radiography  Severe osteopenia and degenerative changes as above.  Aortic Atherosclerosis (ICD10-I70.0).  Electronically Signed   By: Jerilynn Mages.  Shick M.D.   On: 08/21/2020 13:53  Assessment  The primary encounter diagnosis was Acute exacerbation of chronic low back pain. Diagnoses of Lumbar facet syndrome (Right), Chronic sacroiliac joint pain (Right), Grade 1 Anterolisthesis of L4 over L5 (5 mm), Pathological fracture of sacral vertebra due to secondary osteoporosis (HCC), Traumatic closed fracture of sacrum (S3), sequela, and Chronic anticoagulation (Eliquis) were also pertinent to this visit.  Plan of Care  Problem-specific:  No problem-specific Assessment & Plan notes found for this encounter.  Ms. NALDA SHACKLEFORD has a current medication list which includes the following long-term medication(s): eliquis, fluticasone, furosemide, sotalol, synthroid, and tramadol.  Pharmacotherapy (Medications Ordered): No orders of the defined types were placed in this encounter.  Orders:  Orders Placed This Encounter  Procedures  . LUMBAR FACET(MEDIAL BRANCH NERVE BLOCK) MBNB    Standing Status:    Future    Standing Expiration Date:   09/30/2020    Scheduling Instructions:     Procedure: Lumbar facet block (AKA.: Lumbosacral medial branch nerve block)     Side: Right-sided     Level: L3-4, L4-5, & L5-S1 Facets (L2, L3, L4, L5, & S1 Medial Branch Nerves)     Sedation: Patient's choice.     Timeframe: ASAP    Order Specific Question:   Where will this procedure be performed?    Answer:   [facility]  . SACROILIAC JOINT INJECTION    Standing Status:   Future    Standing Expiration Date:   09/30/2020    Scheduling Instructions:     Side: Right-sided     Sedation: Patient's choice.     Timeframe: ASAP    Order Specific Question:   Where will this procedure be performed?    Answer:   [facility]  . Blood Thinner Instructions to Nursing    Always make sure patient has clearance from prescribing physician to stop blood thinners for interventional therapies. If the patient requires a Lovenox-bridge therapy, make sure arrangements are made to institute it with the assistance of the PCP.    Scheduling Instructions:     Have Ms. Cacciola stop the Eliquis (Apixaban) x 3 days prior to procedure or surgery.   Follow-up plan:   Return for Procedure (w/ sedation): (R) L-FCT + (R) SI BLK, (Blood Thinner Protocol) w/ Dr. Holley Raring.      Interventional Therapies  Risk  Complexity Considerations:   NOTE:Eliquis Anticoagulation(Stop:3 days Restart: 6 hrs)  Advanced age  Latex allergy   Planned  Pending:   Palliative right lumbar facet block + right sacroiliac joint block    Under consideration:   Palliative interventions    Completed:   Palliative/therapeutic caudal ESI x1 (07/21/2019) (100/100/90/90)  Palliative right lumbar facet (L2, L3, L4, L5, & S1) MBBx8(07/05/2020) (100/100/90/75)  Palliative/Therapeuticright lumbar facet RFA x2(03/27/2018) (100/100/100/90) (100/90/0)  Palliative right sacroiliac joint blockx3 Palliative/therapeutic right-sidedSIjoint RFAx1  (03/27/2018) (100/90/0)    Therapeutic  Palliative (PRN) options:   Palliative/therapeutic caudal ESI #2  Palliative right lumbar facet block#9 Palliative/Therapeuticright lumbar facet RFA #3 Palliative right sacroiliac joint block#4 Palliative/therapeutic right-sidedSIjoint RFA#2     Recent Visits Date Type Provider Dept  07/18/20 Telemedicine Milinda Pointer, MD [facility]  07/05/20 Procedure visit Milinda Pointer, MD [facility]  06/20/20 Telemedicine Milinda Pointer, MD [facility]  Showing recent visits within past 90 days and meeting all other requirements Today's Visits Date Type Provider Dept  08/30/20 Telemedicine Milinda Pointer, MD [facility]  Showing today's visits and meeting all other requirements Future Appointments Date Type Provider Dept  10/24/20 Appointment Milinda Pointer, MD [facility]  Showing future appointments within next 90 days and meeting all other requirements  I discussed the assessment and treatment plan with the patient. The patient was provided an opportunity to ask questions and all were answered. The patient agreed with the plan and demonstrated an understanding of the instructions.  Patient advised to  call back or seek an in-person evaluation if the symptoms or condition worsens.  Duration of encounter: 15 minutes.  Note by: Gaspar Cola, MD Date: 08/30/2020; Time: 5:54 PM

## 2020-08-30 NOTE — Patient Instructions (Signed)
____________________________________________________________________________________________  Blood Thinners  IMPORTANT NOTICE:  If you take any of these, make sure to notify the nursing staff.  Failure to do so may result in injury.  Recommended time intervals to stop and restart blood-thinners, before & after invasive procedures  Generic Name Brand Name Stop Time. Must be stopped at least this long before procedures. After procedures, wait at least this long before re-starting.  Abciximab Reopro 15 days 2 hrs  Alteplase Activase 10 days 10 days  Anagrelide Agrylin    Apixaban Eliquis 3 days 6 hrs  Cilostazol Pletal 3 days 5 hrs  Clopidogrel Plavix 7-10 days 2 hrs  Dabigatran Pradaxa 5 days 6 hrs  Dalteparin Fragmin 24 hours 4 hrs  Dipyridamole Aggrenox 11days 2 hrs  Edoxaban Lixiana; Savaysa 3 days 2 hrs  Enoxaparin  Lovenox 24 hours 4 hrs  Eptifibatide Integrillin 8 hours 2 hrs  Fondaparinux  Arixtra 72 hours 12 hrs  Hydroxychloroquine Plaquenil 11 days   Prasugrel Effient 7-10 days 6 hrs  Reteplase Retavase 10 days 10 days  Rivaroxaban Xarelto 3 days 6 hrs  Ticagrelor Brilinta 5-7 days 6 hrs  Ticlopidine Ticlid 10-14 days 2 hrs  Tinzaparin Innohep 24 hours 4 hrs  Tirofiban Aggrastat 8 hours 2 hrs  Warfarin Coumadin 5 days 2 hrs   Other medications with blood-thinning effects  Product indications Generic (Brand) names Note  Cholesterol Lipitor Stop 4 days before procedure  Blood thinner (injectable) Heparin (LMW or LMWH Heparin) Stop 24 hours before procedure  Cancer Ibrutinib (Imbruvica) Stop 7 days before procedure  Malaria/Rheumatoid Hydroxychloroquine (Plaquenil) Stop 11 days before procedure  Thrombolytics  10 days before or after procedures   Over-the-counter (OTC) Products with blood-thinning effects  Product Common names Stop Time  Aspirin > 325 mg Goody Powders, Excedrin, etc. 11 days  Aspirin ? 81 mg  7 days  Fish oil  4 days  Garlic supplements  7 days   Ginkgo biloba  36 hours  Ginseng  24 hours  NSAIDs Ibuprofen, Naprosyn, etc. 3 days  Vitamin E  4 days   ____________________________________________________________________________________________  ____________________________________________________________________________________________  Preparing for Procedure with Sedation  Procedure appointments are limited to planned procedures: . No Prescription Refills. . No disability issues will be discussed. . No medication changes will be discussed.  Instructions: . Oral Intake: Do not eat or drink anything for at least 8 hours prior to your procedure. (Exception: Blood Pressure Medication. See below.) . Transportation: Unless otherwise stated by your physician, you may drive yourself after the procedure. . Blood Pressure Medicine: Do not forget to take your blood pressure medicine with a sip of water the morning of the procedure. If your Diastolic (lower reading)is above 100 mmHg, elective cases will be cancelled/rescheduled. . Blood thinners: These will need to be stopped for procedures. Notify our staff if you are taking any blood thinners. Depending on which one you take, there will be specific instructions on how and when to stop it. . Diabetics on insulin: Notify the staff so that you can be scheduled 1st case in the morning. If your diabetes requires high dose insulin, take only  of your normal insulin dose the morning of the procedure and notify the staff that you have done so. . Preventing infections: Shower with an antibacterial soap the morning of your procedure. . Build-up your immune system: Take 1000 mg of Vitamin C with every meal (3 times a day) the day prior to your procedure. Marland Kitchen Antibiotics: Inform the staff if you have  a condition or reason that requires you to take antibiotics before dental procedures. . Pregnancy: If you are pregnant, call and cancel the procedure. . Sickness: If you have a cold, fever, or any active  infections, call and cancel the procedure. . Arrival: You must be in the facility at least 30 minutes prior to your scheduled procedure. . Children: Do not bring children with you. . Dress appropriately: Bring dark clothing that you would not mind if they get stained. . Valuables: Do not bring any jewelry or valuables.  Reasons to call and reschedule or cancel your procedure: (Following these recommendations will minimize the risk of a serious complication.) . Surgeries: Avoid having procedures within 2 weeks of any surgery. (Avoid for 2 weeks before or after any surgery). . Flu Shots: Avoid having procedures within 2 weeks of a flu shots or . (Avoid for 2 weeks before or after immunizations). . Barium: Avoid having a procedure within 7-10 days after having had a radiological study involving the use of radiological contrast. (Myelograms, Barium swallow or enema study). . Heart attacks: Avoid any elective procedures or surgeries for the initial 6 months after a "Myocardial Infarction" (Heart Attack). . Blood thinners: It is imperative that you stop these medications before procedures. Let us know if you if you take any blood thinner.  . Infection: Avoid procedures during or within two weeks of an infection (including chest colds or gastrointestinal problems). Symptoms associated with infections include: Localized redness, fever, chills, night sweats or profuse sweating, burning sensation when voiding, cough, congestion, stuffiness, runny nose, sore throat, diarrhea, nausea, vomiting, cold or Flu symptoms, recent or current infections. It is specially important if the infection is over the area that we intend to treat. Marland Kitchen Heart and lung problems: Symptoms that may suggest an active cardiopulmonary problem include: cough, chest pain, breathing difficulties or shortness of breath, dizziness, ankle swelling, uncontrolled high or unusually low blood pressure, and/or palpitations. If you are experiencing any of  these symptoms, cancel your procedure and contact your primary care physician for an evaluation.  Remember:  Regular Business hours are:  Monday to Thursday 8:00 AM to 4:00 PM  Provider's Schedule: Milinda Pointer, MD:  Procedure days: Tuesday and Thursday 7:30 AM to 4:00 PM  Gillis Santa, MD:  Procedure days: Monday and Wednesday 7:30 AM to 4:00 PM ____________________________________________________________________________________________

## 2020-09-01 ENCOUNTER — Other Ambulatory Visit: Payer: Self-pay | Admitting: Family Medicine

## 2020-09-07 ENCOUNTER — Ambulatory Visit (HOSPITAL_BASED_OUTPATIENT_CLINIC_OR_DEPARTMENT_OTHER): Payer: Medicare Other | Admitting: Student in an Organized Health Care Education/Training Program

## 2020-09-07 ENCOUNTER — Other Ambulatory Visit: Payer: Self-pay

## 2020-09-07 ENCOUNTER — Encounter: Payer: Self-pay | Admitting: Student in an Organized Health Care Education/Training Program

## 2020-09-07 ENCOUNTER — Ambulatory Visit
Admission: RE | Admit: 2020-09-07 | Discharge: 2020-09-07 | Disposition: A | Discharge status: Home / Self Care | Payer: Medicare Other | Source: Ambulatory Visit | Attending: Student in an Organized Health Care Education/Training Program | Admitting: Student in an Organized Health Care Education/Training Program

## 2020-09-07 VITALS — BP 114/71 | HR 96 | Temp 96.4°F | Resp 20 | Ht 59.0 in | Wt 114.0 lb

## 2020-09-07 DIAGNOSIS — M47816 Spondylosis without myelopathy or radiculopathy, lumbar region: Secondary | ICD-10-CM | POA: Diagnosis not present

## 2020-09-07 DIAGNOSIS — M545 Low back pain, unspecified: Secondary | ICD-10-CM

## 2020-09-07 DIAGNOSIS — M461 Sacroiliitis, not elsewhere classified: Secondary | ICD-10-CM

## 2020-09-07 DIAGNOSIS — G8929 Other chronic pain: Secondary | ICD-10-CM | POA: Diagnosis not present

## 2020-09-07 DIAGNOSIS — M533 Sacrococcygeal disorders, not elsewhere classified: Secondary | ICD-10-CM | POA: Insufficient documentation

## 2020-09-07 MED ORDER — METHYLPREDNISOLONE ACETATE 40 MG/ML IJ SUSP
INTRAMUSCULAR | Status: AC
  Filled 2020-09-07: qty 1

## 2020-09-07 MED ORDER — LIDOCAINE HCL 2 % IJ SOLN
20.0000 mL | Freq: Once | INTRAMUSCULAR | Status: AC
  Administered 2020-09-07: 400 mg

## 2020-09-07 MED ORDER — DEXAMETHASONE SODIUM PHOSPHATE 10 MG/ML IJ SOLN
10.0000 mg | Freq: Once | INTRAMUSCULAR | Status: AC
  Administered 2020-09-07: 10 mg

## 2020-09-07 MED ORDER — ROPIVACAINE HCL 2 MG/ML IJ SOLN
9.0000 mL | Freq: Once | INTRAMUSCULAR | Status: AC
  Administered 2020-09-07: 10 mL via PERINEURAL

## 2020-09-07 MED ORDER — DEXAMETHASONE SODIUM PHOSPHATE 10 MG/ML IJ SOLN
INTRAMUSCULAR | Status: AC
  Filled 2020-09-07: qty 2

## 2020-09-07 MED ORDER — LIDOCAINE HCL 2 % IJ SOLN
INTRAMUSCULAR | Status: AC
  Filled 2020-09-07: qty 20

## 2020-09-07 MED ORDER — IOHEXOL 180 MG/ML  SOLN
INTRAMUSCULAR | Status: AC
  Filled 2020-09-07: qty 20

## 2020-09-07 MED ORDER — METHYLPREDNISOLONE ACETATE 40 MG/ML IJ SUSP
40.0000 mg | Freq: Once | INTRAMUSCULAR | Status: AC
  Administered 2020-09-07: 40 mg via INTRA_ARTICULAR

## 2020-09-07 MED ORDER — ROPIVACAINE HCL 2 MG/ML IJ SOLN
INTRAMUSCULAR | Status: AC
  Filled 2020-09-07: qty 20

## 2020-09-07 MED ORDER — ROPIVACAINE HCL 2 MG/ML IJ SOLN
INTRAMUSCULAR | Status: AC
  Filled 2020-09-07: qty 10

## 2020-09-07 NOTE — Progress Notes (Deleted)
PROVIDER NOTE: Information contained herein reflects review and annotations entered in association with encounter. Interpretation of such information and data should be left to medically-trained personnel. Information provided to patient can be located elsewhere in the medical record under "Patient Instructions". Document created using STT-dictation technology, any transcriptional errors that may result from process are unintentional.    Patient: Amanda Soto  Service Category: Procedure  Provider: Gillis Santa, MD  DOB: [birth year]  DOS: 09/07/2020  Location: [facility]  MRN: [id number]  Setting: Ambulatory - outpatient  Referring Provider: [name], MD  Type: Established Patient  Specialty: Interventional Pain Management  PCP: [name], MD   Primary Reason for Visit: Interventional Pain Management Treatment. CC: Back Pain (Low back)  Procedure:          Anesthesia, Analgesia, Anxiolysis:  Procedure #1: Type: Medial Branch Facet Block #5 Primary Purpose: Palliative (previously done 07/05/2020) Region: Lumbar Level: L2, L3, L4, L5, & S1 Medial Branch Level(s) Target Area: For Lumbar Facet blocks, the target is the groove formed by the junction of the transverse process and superior articular process. For the L5 dorsal ramus, the target is the notch between superior articular process and sacral ala. For the S1 dorsal ramus, the target is the superior and lateral edge of the posterior S1 Sacral foramen. Approach: Posterior, paramedial, percutaneous approach. Laterality: Right  Procedure #2: Type: Sacroiliac Joint Block  #1  Primary Purpose: Diagnostic Region: Posterior Lumbosacral Level: PIIS (Posterior Inferior Iliac Spine) Sacroiliac Joint Target Area: For lower sacroiliac joint block(s), the target is the inferior and posterior margin of the sacroiliac joint. Approach: Ipsilateral approach. Laterality: Right  Type: Local Anesthesia  Local Anesthetic:  Lidocaine 1-2%  Position: Prone   Indications: 1. Lumbar facet syndrome (Right)   2. Acute exacerbation of chronic low back pain   3. Chronic sacroiliac joint pain (Right)    Pain Score: Pre-procedure: 5 /10 Post-procedure: 5 /10   Patient discontinued her Eliquis 3 days prior to her procedure.  Pre-op H&P Assessment:  Ms. Cozzolino is a 87 y.o. (year old), female patient, seen today for interventional treatment. She  has a past surgical history that includes Cataract extraction; Partial hysterectomy; Appendectomy; and Tonsillectomy. Ms. Stroder has a current medication list which includes the following prescription(s): acetaminophen, vitamin d3, eliquis, fluticasone, furosemide, multiple vitamins-minerals, omeprazole, sotalol, synthroid, tramadol, and vitamin b-12. Her primarily concern today is the Back Pain (Low back)  Initial Vital Signs:  Pulse/HCG Rate: 96  Temp: (!) 96.4 F (35.8 C) Resp: 16 BP: 109/65 SpO2: 95 %  BMI: Estimated body mass index is 23.03 kg/m as calculated from the following:   Height as of this encounter: 4\' 11"  (1.499 m).   Weight as of this encounter: 114 lb (51.7 kg).  Risk Assessment: Allergies: Reviewed. She is allergic to hydrocodone, ciprofloxacin, and latex.  Allergy Precautions: None required Coagulopathies: Reviewed. None identified.  Blood-thinner therapy: None at this time Active Infection(s): Reviewed. None identified. Ms. Ambroise is afebrile  Site Confirmation: Ms. Pleitez was asked to confirm the procedure and laterality before marking the site Procedure checklist: Completed Consent: Before the procedure and under the influence of no sedative(s), amnesic(s), or anxiolytics, the patient was informed of the treatment options, risks and possible complications. To fulfill our ethical and legal obligations, as recommended by the American Medical Association's Code of Ethics, I have informed the patient of my clinical impression; the nature and purpose  of the treatment or procedure; the risks, benefits,  and possible complications of the intervention; the alternatives, including doing nothing; the risk(s) and benefit(s) of the alternative treatment(s) or procedure(s); and the risk(s) and benefit(s) of doing nothing. The patient was provided information about the general risks and possible complications associated with the procedure. These may include, but are not limited to: failure to achieve desired goals, infection, bleeding, organ or nerve damage, allergic reactions, paralysis, and death. In addition, the patient was informed of those risks and complications associated to Spine-related procedures, such as failure to decrease pain; infection (i.e.: Meningitis, epidural or intraspinal abscess); bleeding (i.e.: epidural hematoma, subarachnoid hemorrhage, or any other type of intraspinal or peri-dural bleeding); organ or nerve damage (i.e.: Any type of peripheral nerve, nerve root, or spinal cord injury) with subsequent damage to sensory, motor, and/or autonomic systems, resulting in permanent pain, numbness, and/or weakness of one or several areas of the body; allergic reactions; (i.e.: anaphylactic reaction); and/or death. Furthermore, the patient was informed of those risks and complications associated with the medications. These include, but are not limited to: allergic reactions (i.e.: anaphylactic or anaphylactoid reaction(s)); adrenal axis suppression; blood sugar elevation that in diabetics may result in ketoacidosis or comma; water retention that in patients with history of congestive heart failure may result in shortness of breath, pulmonary edema, and decompensation with resultant heart failure; weight gain; swelling or edema; medication-induced neural toxicity; particulate matter embolism and blood vessel occlusion with resultant organ, and/or nervous system infarction; and/or aseptic necrosis of one or more joints. Finally, the patient was informed  that Medicine is not an exact science; therefore, there is also the possibility of unforeseen or unpredictable risks and/or possible complications that may result in a catastrophic outcome. The patient indicated having understood very clearly. We have given the patient no guarantees and we have made no promises. Enough time was given to the patient to ask questions, all of which were answered to the patient's satisfaction. Ms. Cala has indicated that she wanted to continue with the procedure. Attestation: I, the ordering provider, attest that I have discussed with the patient the benefits, risks, side-effects, alternatives, likelihood of achieving goals, and potential problems during recovery for the procedure that I have provided informed consent. Date  Time: 09/07/2020 12:15 PM  Pre-Procedure Preparation:  Monitoring: As per clinic protocol. Respiration, ETCO2, SpO2, BP, heart rate and rhythm monitor placed and checked for adequate function Safety Precautions: Patient was assessed for positional comfort and pressure points before starting the procedure. Time-out: I initiated and conducted the "Time-out" before starting the procedure, as per protocol. The patient was asked to participate by confirming the accuracy of the "Time Out" information. Verification of the correct person, site, and procedure were performed and confirmed by me, the nursing staff, and the patient. "Time-out" conducted as per Joint Commission's Universal Protocol (UP.01.01.01). Time:    Description of Procedure #1:   Time-out: "Time-out" completed before starting procedure, as per protocol. Area Prepped: Entire Posterior Lumbosacral Region DuraPrep (Iodine Povacrylex [0.7% available iodine] and Isopropyl Alcohol, 74% w/w) Safety Precautions: Aspiration looking for blood return was conducted prior to all injections. At no point did we inject any substances, as a needle was being advanced. No attempts were made at seeking any  paresthesias. Safe injection practices and needle disposal techniques used. Medications properly checked for expiration dates. SDV (single dose vial) medications used.  Description of the Procedure: Protocol guidelines were followed. The patient was placed in position over the fluoroscopy table. The target area was identified and the area  prepped in the usual manner. Skin & deeper tissues infiltrated with local anesthetic. Appropriate amount of time allowed to pass for local anesthetics to take effect. The procedure needle was introduced through the skin, ipsilateral to the reported pain, and advanced to the target area. Employing the "Medial Branch Technique", the needles were advanced to the angle made by the superior and medial portion of the transverse process, and the lateral and inferior portion of the superior articulating process of the targeted vertebral bodies. This area is known as "Burton's Eye" or the "Eye of the [place] Dog". A procedure needle was introduced through the skin, and this time advanced to the angle made by the superior and medial border of the sacral ala, and the lateral border of the S1 vertebral body. This last needle was later repositioned at the superior and lateral border of the posterior S1 foramen. Negative aspiration confirmed. Solution injected in intermittent fashion, asking for systemic symptoms every 0.5cc of injectate. The needles were then removed and the area cleansed, making sure to leave some of the prepping solution back to take advantage of its long term bactericidal properties. Start Time:   hrs. Materials:  Needle(s) Type: Spinal Needle Gauge: 22G Length: 3.5-in Medication(s): Please see orders for medications and dosing details.  Description of Procedure # 2:   Area Prepped: Entire Posterior Lumbosacral Region DuraPrep (Iodine Povacrylex [0.7% available iodine] and Isopropyl Alcohol, 74% w/w) Safety Precautions: Aspiration looking for blood return was  conducted prior to all injections. At no point did we inject any substances, as a needle was being advanced. No attempts were made at seeking any paresthesias. Safe injection practices and needle disposal techniques used. Medications properly checked for expiration dates. SDV (single dose vial) medications used. Description of the Procedure: Protocol guidelines were followed. The patient was placed in position over the fluoroscopy table. The target area was identified and the area prepped in the usual manner. Skin & deeper tissues infiltrated with local anesthetic. Appropriate amount of time allowed to pass for local anesthetics to take effect. The procedure needle was advanced under fluoroscopic guidance into the sacroiliac joint until a firm endpoint was obtained. Proper needle placement secured. Negative aspiration confirmed. Solution injected in intermittent fashion, asking for systemic symptoms every 0.5cc of injectate. The needles were then removed and the area cleansed, making sure to leave some of the prepping solution back to take advantage of its long term bactericidal properties. Vitals:   09/07/20 1217  BP: 109/65  Pulse: 96  Resp: 16  Temp: (!) 96.4 F (35.8 C)  TempSrc: Temporal  SpO2: 95%  Weight: 114 lb (51.7 kg)  Height: 4\' 11"  (1.499 m)    End Time:   hrs. Materials:  Needle(s) Type: Spinal Needle Gauge: 22G Length: 3.5-in Medication(s): Please see orders for medications and dosing details.  Imaging Guidance (Spinal):          Type of Imaging Technique: Fluoroscopy Guidance (Spinal) Indication(s): Assistance in needle guidance and placement for procedures requiring needle placement in or near specific anatomical locations not easily accessible without such assistance. Exposure Time: Please see nurses notes. Contrast: None used. Fluoroscopic Guidance: I was personally present during the use of fluoroscopy. "Tunnel Vision Technique" used to obtain the best possible view of  the target area. Parallax error corrected before commencing the procedure. "Direction-depth-direction" technique used to introduce the needle under continuous pulsed fluoroscopy. Once target was reached, antero-posterior, oblique, and lateral fluoroscopic projection used confirm needle placement in all planes. Images permanently stored  in EMR. Interpretation: No contrast injected. I personally interpreted the imaging intraoperatively. Adequate needle placement confirmed in multiple planes. Permanent images saved into the patient's record.  Antibiotic Prophylaxis:   Anti-infectives (From admission, onward)   None     Indication(s): None identified  Post-operative Assessment:  Post-procedure Vital Signs:  Pulse/HCG Rate: 96  Temp: (!) 96.4 F (35.8 C) Resp: 16 BP: 109/65 SpO2: 95 %  EBL: None  Complications: No immediate post-treatment complications observed by team, or reported by patient.  Note: The patient tolerated the entire procedure well. A repeat set of vitals were taken after the procedure and the patient was kept under observation following institutional policy, for this type of procedure. Post-procedural neurological assessment was performed, showing return to baseline, prior to discharge. The patient was provided with post-procedure discharge instructions, including a section on how to identify potential problems. Should any problems arise concerning this procedure, the patient was given instructions to immediately contact us, at any time, without hesitation. In any case, we plan to contact the patient by telephone for a follow-up status report regarding this interventional procedure.  Comments:  No additional relevant information.  Plan of Care  Orders:  No orders of the defined types were placed in this encounter.  Medications ordered for procedure: No orders of the defined types were placed in this encounter.  Medications administered: Amanda Soto had no  medications administered during this visit.  See the medical record for exact dosing, route, and time of administration.  Follow-up plan:   No follow-ups on file.      {There is no content from the last Plan section.}   Recent Visits Date Type Provider Dept  08/30/20 Telemedicine Milinda Pointer, MD [facility]  07/18/20 Telemedicine Milinda Pointer, MD [facility]  07/05/20 Procedure visit Milinda Pointer, MD [facility]  06/20/20 Telemedicine Milinda Pointer, MD [facility]  Showing recent visits within past 90 days and meeting all other requirements Today's Visits Date Type Provider Dept  09/07/20 Procedure visit Gillis Santa, MD [facility]  Showing today's visits and meeting all other requirements Future Appointments Date Type Provider Dept  10/24/20 Appointment Milinda Pointer, MD [facility]  Showing future appointments within next 90 days and meeting all other requirements  Disposition: Discharge home  Discharge (Date  Time): 09/07/2020;   hrs.   Primary Care Physician: [name], MD Location: [facility] Note by: Gillis Santa, MD Date: 09/07/2020; Time: 12:30 PM  Disclaimer:  Medicine is not an exact science. The only guarantee in medicine is that nothing is guaranteed. It is important to note that the decision to proceed with this intervention was based on the information collected from the patient. The Data and conclusions were drawn from the patient's questionnaire, the interview, and the physical examination. Because the information was provided in large part by the patient, it cannot be guaranteed that it has not been purposely or unconsciously manipulated. Every effort has been made to obtain as much relevant data as possible for this evaluation. It is important to note that the conclusions that lead to this procedure are derived in large part from the available  data. Always take into account that the treatment will also be dependent on availability of resources and existing treatment guidelines, considered by other Pain Management Practitioners as being common knowledge and practice, at the time of the intervention. For Medico-Legal purposes, it is also important to point out that variation in  procedural techniques and pharmacological choices are the acceptable norm. The indications, contraindications, technique, and results of the above procedure should only be interpreted and judged by a Board-Certified Interventional Pain Specialist with extensive familiarity and expertise in the same exact procedure and technique.

## 2020-09-07 NOTE — Progress Notes (Addendum)
PROVIDER NOTE: Information contained herein reflects review and annotations entered in association with encounter. Interpretation of such information and data should be left to medically-trained personnel. Information provided to patient can be located elsewhere in the medical record under "Patient Instructions". Document created using STT-dictation technology, any transcriptional errors that may result from process are unintentional.    Patient: Amanda Soto  Service Category: Procedure  Provider: Gillis Santa, MD  DOB: 1924-12-07  DOS: 09/07/2020  Location: [facility]  MRN: [id number]  Setting: Ambulatory - outpatient  Referring Provider: [name], MD  Type: Established Patient  Specialty: Interventional Pain Management  PCP: [name], MD   Primary Reason for Visit: Interventional Pain Management Treatment. CC: Back Pain (Low back)  Procedure:          Anesthesia, Analgesia, Anxiolysis:  Type: Palliative Sacroiliac Joint Steroid Injection 5.  Region: Inferior Lumbosacral Region Level: PIIS (Posterior Inferior Iliac Spine) Laterality: Right-Side  Type: Local Anesthesia  Local Anesthetic: Lidocaine 1-2%  Position: Prone           Indications: 1. Lumbar facet syndrome (Right)   2. Acute exacerbation of chronic low back pain   3. Chronic sacroiliac joint pain (Right)    Pain Score: Pre-procedure: 5 /10 Post-procedure: 3 /10   Patient stopped her Eliquis 3 days prior to scheduled procedure.  Pre-op H&P Assessment:  Ms. Ron is a 87 y.o. (year old), female patient, seen today for interventional treatment. She  has a past surgical history that includes Cataract extraction; Partial hysterectomy; Appendectomy; and Tonsillectomy. Ms. Martine has a current medication list which includes the following prescription(s): acetaminophen, vitamin d3, eliquis, fluticasone, furosemide, multiple vitamins-minerals, omeprazole, sotalol, synthroid, tramadol, and vitamin  b-12. Her primarily concern today is the Back Pain (Low back)  Initial Vital Signs:  Pulse/HCG Rate: 96ECG Heart Rate: (!) 115 Temp: (!) 96.4 F (35.8 C) Resp: 16 BP: 109/65 SpO2: 95 %  BMI: Estimated body mass index is 23.03 kg/m as calculated from the following:   Height as of this encounter: 4\' 11"  (1.499 m).   Weight as of this encounter: 114 lb (51.7 kg).  Risk Assessment: Allergies: Reviewed. She is allergic to hydrocodone, ciprofloxacin, and latex.  Allergy Precautions: None required Coagulopathies: Reviewed. None identified.  Blood-thinner therapy: None at this time Active Infection(s): Reviewed. None identified. Ms. Schaafsma is afebrile  Site Confirmation: Ms. Aydt was asked to confirm the procedure and laterality before marking the site Procedure checklist: Completed Consent: Before the procedure and under the influence of no sedative(s), amnesic(s), or anxiolytics, the patient was informed of the treatment options, risks and possible complications. To fulfill our ethical and legal obligations, as recommended by the American Medical Association's Code of Ethics, I have informed the patient of my clinical impression; the nature and purpose of the treatment or procedure; the risks, benefits, and possible complications of the intervention; the alternatives, including doing nothing; the risk(s) and benefit(s) of the alternative treatment(s) or procedure(s); and the risk(s) and benefit(s) of doing nothing. The patient was provided information about the general risks and possible complications associated with the procedure. These may include, but are not limited to: failure to achieve desired goals, infection, bleeding, organ or nerve damage, allergic reactions, paralysis, and death. In addition, the patient was informed of those risks and complications associated to the procedure, such as failure to decrease pain; infection; bleeding; organ or nerve damage with subsequent damage to  sensory, motor, and/or autonomic systems, resulting in permanent pain,  numbness, and/or weakness of one or several areas of the body; allergic reactions; (i.e.: anaphylactic reaction); and/or death. Furthermore, the patient was informed of those risks and complications associated with the medications. These include, but are not limited to: allergic reactions (i.e.: anaphylactic or anaphylactoid reaction(s)); adrenal axis suppression; blood sugar elevation that in diabetics may result in ketoacidosis or comma; water retention that in patients with history of congestive heart failure may result in shortness of breath, pulmonary edema, and decompensation with resultant heart failure; weight gain; swelling or edema; medication-induced neural toxicity; particulate matter embolism and blood vessel occlusion with resultant organ, and/or nervous system infarction; and/or aseptic necrosis of one or more joints. Finally, the patient was informed that Medicine is not an exact science; therefore, there is also the possibility of unforeseen or unpredictable risks and/or possible complications that may result in a catastrophic outcome. The patient indicated having understood very clearly. We have given the patient no guarantees and we have made no promises. Enough time was given to the patient to ask questions, all of which were answered to the patient's satisfaction. Ms. Vari has indicated that she wanted to continue with the procedure. Attestation: I, the ordering provider, attest that I have discussed with the patient the benefits, risks, side-effects, alternatives, likelihood of achieving goals, and potential problems during recovery for the procedure that I have provided informed consent. Date  Time: 09/07/2020 12:15 PM  Pre-Procedure Preparation:  Monitoring: As per clinic protocol. Respiration, ETCO2, SpO2, BP, heart rate and rhythm monitor placed and checked for adequate function Safety Precautions: Patient was  assessed for positional comfort and pressure points before starting the procedure. Time-out: I initiated and conducted the "Time-out" before starting the procedure, as per protocol. The patient was asked to participate by confirming the accuracy of the "Time Out" information. Verification of the correct person, site, and procedure were performed and confirmed by me, the nursing staff, and the patient. "Time-out" conducted as per Joint Commission's Universal Protocol (UP.01.01.01). Time: 1252  Description of Procedure:          Target Area: Superior, posterior, aspect of the sacroiliac fissure Approach: Posterior, paraspinal, ipsilateral approach. Area Prepped: Entire Lower Lumbosacral Region DuraPrep (Iodine Povacrylex [0.7% available iodine] and Isopropyl Alcohol, 74% w/w) Safety Precautions: Aspiration looking for blood return was conducted prior to all injections. At no point did we inject any substances, as a needle was being advanced. No attempts were made at seeking any paresthesias. Safe injection practices and needle disposal techniques used. Medications properly checked for expiration dates. SDV (single dose vial) medications used. Description of the Procedure: Protocol guidelines were followed. The patient was placed in position over the procedure table. The target area was identified and the area prepped in the usual manner. Skin & deeper tissues infiltrated with local anesthetic. Appropriate amount of time allowed to pass for local anesthetics to take effect. The procedure needle was advanced under fluoroscopic guidance into the sacroiliac joint until a firm endpoint was obtained. Proper needle placement secured. Negative aspiration confirmed. Solution injected in intermittent fashion, asking for systemic symptoms every 0.5cc of injectate. The needles were then removed and the area cleansed, making sure to leave some of the prepping solution back to take advantage of its long term bactericidal  properties. Vitals:   09/07/20 1254 09/07/20 1300 09/07/20 1304 09/07/20 1309  BP: (!) 123/93 134/75 109/72 114/71  Pulse:      Resp: 18 20 20 20   Temp:      TempSrc:  SpO2: 97% 97% 96% 96%  Weight:      Height:        Start Time: 1253 hrs. End Time: 1309 hrs. Materials:  Needle(s) Type: Spinal Needle Gauge: 25G Length: 3.5-in Medication(s): Please see orders for medications and dosing details. 5 cc solution made of 4 cc of 0.2% ropivacaine, 1 cc of methylprednisolone, 40 mg/cc.  Injected into the right sacroiliac joint after contrast confirmation. Imaging Guidance (Non-Spinal):          Type of Imaging Technique: Fluoroscopy Guidance (Non-Spinal) Indication(s): Assistance in needle guidance and placement for procedures requiring needle placement in or near specific anatomical locations not easily accessible without such assistance. Exposure Time: Please see nurses notes. Contrast: Before injecting any contrast, we confirmed that the patient did not have an allergy to iodine, shellfish, or radiological contrast. Once satisfactory needle placement was completed at the desired level, radiological contrast was injected. Contrast injected under live fluoroscopy. No contrast complications. See chart for type and volume of contrast used. Fluoroscopic Guidance: I was personally present during the use of fluoroscopy. "Tunnel Vision Technique" used to obtain the best possible view of the target area. Parallax error corrected before commencing the procedure. "Direction-depth-direction" technique used to introduce the needle under continuous pulsed fluoroscopy. Once target was reached, antero-posterior, oblique, and lateral fluoroscopic projection used confirm needle placement in all planes. Images permanently stored in EMR. Interpretation: I personally interpreted the imaging intraoperatively. Adequate needle placement confirmed in multiple planes. Appropriate spread of contrast into desired area  was observed. No evidence of afferent or efferent intravascular uptake. Permanent images saved into the patient's record.   Post-operative Assessment:  Post-procedure Vital Signs:  Pulse/HCG Rate: 96(!) 101 Temp: (!) 96.4 F (35.8 C) Resp: 20 BP: 114/71 SpO2: 96 %  EBL: None  Complications: No immediate post-treatment complications observed by team, or reported by patient.  Note: The patient tolerated the entire procedure well. A repeat set of vitals were taken after the procedure and the patient was kept under observation following institutional policy, for this type of procedure. Post-procedural neurological assessment was performed, showing return to baseline, prior to discharge. The patient was provided with post-procedure discharge instructions, including a section on how to identify potential problems. Should any problems arise concerning this procedure, the patient was given instructions to immediately contact us, at any time, without hesitation. In any case, we plan to contact the patient by telephone for a follow-up status report regarding this interventional procedure.  Comments:  No additional relevant information.  Plan of Care  Orders:  Orders Placed This Encounter  Procedures  . [facility] C-ARM 1-60 MIN NO REPORT    Intraoperative interpretation by procedural physician at [place].    Standing Status:   Standing    Number of Occurrences:   1    Order Specific Question:   Reason for exam:    Answer:   Assistance in needle guidance and placement for procedures requiring needle placement in or near specific anatomical locations not easily accessible without such assistance.   Medications ordered for procedure: Meds ordered this encounter  Medications  . lidocaine (XYLOCAINE) 2 % (with pres) injection 400 mg  . ropivacaine (PF) 2 mg/mL (0.2%) (NAROPIN) injection 9 mL  . ropivacaine (PF) 2 mg/mL (0.2%) (NAROPIN) injection 9 mL  . dexamethasone  (DECADRON) injection 10 mg  . dexamethasone (DECADRON) injection 10 mg  . methylPREDNISolone acetate (DEPO-MEDROL) injection 40 mg   Medications administered: We administered lidocaine, ropivacaine (PF)  2 mg/mL (0.2%), ropivacaine (PF) 2 mg/mL (0.2%), dexamethasone, dexamethasone, and methylPREDNISolone acetate.  See the medical record for exact dosing, route, and time of administration.  Patient instructed to restart Eliquis tomorrow so long as she is not having any new lower extremity weakness.  Follow-up plan:   Return for Keep sch. appt with Dr Dossie Arbour.    Recent Visits Date Type Provider Dept  08/30/20 Telemedicine Milinda Pointer, MD [facility]  07/18/20 Telemedicine Milinda Pointer, MD [facility]  07/05/20 Procedure visit Milinda Pointer, MD [facility]  06/20/20 Telemedicine Milinda Pointer, MD [facility]  Showing recent visits within past 90 days and meeting all other requirements Today's Visits Date Type Provider Dept  09/07/20 Procedure visit Gillis Santa, MD [facility]  Showing today's visits and meeting all other requirements Future Appointments Date Type Provider Dept  10/24/20 Appointment Milinda Pointer, MD [facility]  Showing future appointments within next 90 days and meeting all other requirements  Disposition: Discharge home  Discharge (Date  Time): 09/07/2020;   hrs.   Primary Care Physician: [name], MD Location: [facility] Note by: Gillis Santa, MD Date: 09/07/2020; Time: 2:19 PM  Disclaimer:  Medicine is not an exact science. The only guarantee in medicine is that nothing is guaranteed. It is important to note that the decision to proceed with this intervention was based on the information collected from the patient. The Data and conclusions were drawn from the patient's questionnaire, the interview, and the physical examination. Because  the information was provided in large part by the patient, it cannot be guaranteed that it has not been purposely or unconsciously manipulated. Every effort has been made to obtain as much relevant data as possible for this evaluation. It is important to note that the conclusions that lead to this procedure are derived in large part from the available data. Always take into account that the treatment will also be dependent on availability of resources and existing treatment guidelines, considered by other Pain Management Practitioners as being common knowledge and practice, at the time of the intervention. For Medico-Legal purposes, it is also important to point out that variation in procedural techniques and pharmacological choices are the acceptable norm. The indications, contraindications, technique, and results of the above procedure should only be interpreted and judged by a Board-Certified Interventional Pain Specialist with extensive familiarity and expertise in the same exact procedure and technique.

## 2020-09-07 NOTE — Progress Notes (Signed)
Safety precautions to be maintained throughout the outpatient stay will include: orient to surroundings, keep bed in low position, maintain call bell within reach at all times, provide assistance with transfer out of bed and ambulation.  

## 2020-09-07 NOTE — Progress Notes (Signed)
PROVIDER NOTE: Information contained herein reflects review and annotations entered in association with encounter. Interpretation of such information and data should be left to medically-trained personnel. Information provided to patient can be located elsewhere in the medical record under "Patient Instructions". Document created using STT-dictation technology, any transcriptional errors that may result from process are unintentional.    Patient: Amanda Soto  Service Category: Procedure  Provider: Gillis Santa, MD  DOB: 1924/12/25  DOS: 09/07/2020  Location: [facility]  MRN: [id number]  Setting: Ambulatory - outpatient  Referring Provider: [name], MD  Type: Established Patient  Specialty: Interventional Pain Management  PCP: [name], MD   Primary Reason for Visit: Interventional Pain Management Treatment. CC: Back Pain (Low back)  Procedure:          Anesthesia, Analgesia, Anxiolysis:  Type: Lumbar Facet, Medial Branch Block(s) #5, previously done 07/06/2019 Primary Purpose: Palliative Region: Posterolateral Lumbosacral Spine Level: L2, L3, L4, L5, & S1 Medial Branch Level(s). Injecting these levels blocks the L3-4, L4-5, and L5-S1 lumbar facet joints. Laterality: Right  Type: Local Anesthesia  Local Anesthetic: Lidocaine 1-2%  Position: Prone   Indications: 1. Lumbar facet syndrome (Right)   2. Acute exacerbation of chronic low back pain   3. Chronic sacroiliac joint pain (Right)    Pain Score: Pre-procedure: 5 /10 Post-procedure: 3 /10   Patient was under the impression that she was receiving a radiofrequency ablation today.  I clarified that according to Dr. Adalberto Cole previous note that we will be repeating the right L2, L3, L4, L5, S1 lumbar facet medial branch nerve block along with the right sacroiliac injection.  Patient in agreement with plan.  Patient stopped her Eliquis 3 days prior to her scheduled procedure.  Pre-op H&P Assessment:   Ms. Manago is a 87 y.o. (year old), female patient, seen today for interventional treatment. She  has a past surgical history that includes Cataract extraction; Partial hysterectomy; Appendectomy; and Tonsillectomy. Ms. Philemon has a current medication list which includes the following prescription(s): acetaminophen, vitamin d3, eliquis, fluticasone, furosemide, multiple vitamins-minerals, omeprazole, sotalol, synthroid, tramadol, and vitamin b-12. Her primarily concern today is the Back Pain (Low back)  Initial Vital Signs:  Pulse/HCG Rate: 96ECG Heart Rate: (!) 115 Temp: (!) 96.4 F (35.8 C) Resp: 16 BP: 109/65 SpO2: 95 %  BMI: Estimated body mass index is 23.03 kg/m as calculated from the following:   Height as of this encounter: 4\' 11"  (1.499 m).   Weight as of this encounter: 114 lb (51.7 kg).  Risk Assessment: Allergies: Reviewed. She is allergic to hydrocodone, ciprofloxacin, and latex.  Allergy Precautions: None required Coagulopathies: Reviewed. None identified.  Blood-thinner therapy: None at this time Active Infection(s): Reviewed. None identified. Ms. Fassnacht is afebrile  Site Confirmation: Ms. Barretta was asked to confirm the procedure and laterality before marking the site Procedure checklist: Completed Consent: Before the procedure and under the influence of no sedative(s), amnesic(s), or anxiolytics, the patient was informed of the treatment options, risks and possible complications. To fulfill our ethical and legal obligations, as recommended by the American Medical Association's Code of Ethics, I have informed the patient of my clinical impression; the nature and purpose of the treatment or procedure; the risks, benefits, and possible complications of the intervention; the alternatives, including doing nothing; the risk(s) and benefit(s) of the alternative treatment(s) or procedure(s); and the risk(s) and benefit(s) of doing nothing. The patient was provided information about  the general risks and possible complications  associated with the procedure. These may include, but are not limited to: failure to achieve desired goals, infection, bleeding, organ or nerve damage, allergic reactions, paralysis, and death. In addition, the patient was informed of those risks and complications associated to Spine-related procedures, such as failure to decrease pain; infection (i.e.: Meningitis, epidural or intraspinal abscess); bleeding (i.e.: epidural hematoma, subarachnoid hemorrhage, or any other type of intraspinal or peri-dural bleeding); organ or nerve damage (i.e.: Any type of peripheral nerve, nerve root, or spinal cord injury) with subsequent damage to sensory, motor, and/or autonomic systems, resulting in permanent pain, numbness, and/or weakness of one or several areas of the body; allergic reactions; (i.e.: anaphylactic reaction); and/or death. Furthermore, the patient was informed of those risks and complications associated with the medications. These include, but are not limited to: allergic reactions (i.e.: anaphylactic or anaphylactoid reaction(s)); adrenal axis suppression; blood sugar elevation that in diabetics may result in ketoacidosis or comma; water retention that in patients with history of congestive heart failure may result in shortness of breath, pulmonary edema, and decompensation with resultant heart failure; weight gain; swelling or edema; medication-induced neural toxicity; particulate matter embolism and blood vessel occlusion with resultant organ, and/or nervous system infarction; and/or aseptic necrosis of one or more joints. Finally, the patient was informed that Medicine is not an exact science; therefore, there is also the possibility of unforeseen or unpredictable risks and/or possible complications that may result in a catastrophic outcome. The patient indicated having understood very clearly. We have given the patient no guarantees and we have made no  promises. Enough time was given to the patient to ask questions, all of which were answered to the patient's satisfaction. Ms. Mcquaid has indicated that she wanted to continue with the procedure. Attestation: I, the ordering provider, attest that I have discussed with the patient the benefits, risks, side-effects, alternatives, likelihood of achieving goals, and potential problems during recovery for the procedure that I have provided informed consent. Date  Time: 09/07/2020 12:15 PM  Pre-Procedure Preparation:  Monitoring: As per clinic protocol. Respiration, ETCO2, SpO2, BP, heart rate and rhythm monitor placed and checked for adequate function Safety Precautions: Patient was assessed for positional comfort and pressure points before starting the procedure. Time-out: I initiated and conducted the "Time-out" before starting the procedure, as per protocol. The patient was asked to participate by confirming the accuracy of the "Time Out" information. Verification of the correct person, site, and procedure were performed and confirmed by me, the nursing staff, and the patient. "Time-out" conducted as per Joint Commission's Universal Protocol (UP.01.01.01). Time: 1252  Description of Procedure:          Laterality: Right Levels:  L2, L3, L4, L5, & S1 Medial Branch Level(s) Area Prepped: Posterior Lumbosacral Region DuraPrep (Iodine Povacrylex [0.7% available iodine] and Isopropyl Alcohol, 74% w/w) Safety Precautions: Aspiration looking for blood return was conducted prior to all injections. At no point did we inject any substances, as a needle was being advanced. Before injecting, the patient was told to immediately notify me if she was experiencing any new onset of "ringing in the ears, or metallic taste in the mouth". No attempts were made at seeking any paresthesias. Safe injection practices and needle disposal techniques used. Medications properly checked for expiration dates. SDV (single dose vial)  medications used. After the completion of the procedure, all disposable equipment used was discarded in the proper designated medical waste containers. Local Anesthesia: Protocol guidelines were followed. The patient was positioned over the fluoroscopy  table. The area was prepped in the usual manner. The time-out was completed. The target area was identified using fluoroscopy. A 12-in long, straight, sterile hemostat was used with fluoroscopic guidance to locate the targets for each level blocked. Once located, the skin was marked with an approved surgical skin marker. Once all sites were marked, the skin (epidermis, dermis, and hypodermis), as well as deeper tissues (fat, connective tissue and muscle) were infiltrated with a small amount of a short-acting local anesthetic, loaded on a 10cc syringe with a 25G, 1.5-in  Needle. An appropriate amount of time was allowed for local anesthetics to take effect before proceeding to the next step. Local Anesthetic: Lidocaine 2.0% The unused portion of the local anesthetic was discarded in the proper designated containers. Technical explanation of process:  L2 Medial Branch Nerve Block (MBB): The target area for the L2 medial branch is at the junction of the postero-lateral aspect of the superior articular process and the superior, posterior, and medial edge of the transverse process of L3. Under fluoroscopic guidance, a Quincke needle was inserted until contact was made with os over the superior postero-lateral aspect of the pedicular shadow (target area). After negative aspiration for blood, 50mL of the nerve block solution was injected without difficulty or complication. The needle was removed intact. L3 Medial Branch Nerve Block (MBB): The target area for the L3 medial branch is at the junction of the postero-lateral aspect of the superior articular process and the superior, posterior, and medial edge of the transverse process of L4. Under fluoroscopic guidance, a  Quincke needle was inserted until contact was made with os over the superior postero-lateral aspect of the pedicular shadow (target area). After negative aspiration for blood, 2 mL of the nerve block solution was injected without difficulty or complication. The needle was removed intact. L4 Medial Branch Nerve Block (MBB): The target area for the L4 medial branch is at the junction of the postero-lateral aspect of the superior articular process and the superior, posterior, and medial edge of the transverse process of L5. Under fluoroscopic guidance, a Quincke needle was inserted until contact was made with os over the superior postero-lateral aspect of the pedicular shadow (target area). After negative aspiration for blood, 82mL of the nerve block solution was injected without difficulty or complication. The needle was removed intact. L5 Medial Branch Nerve Block (MBB): The target area for the L5 medial branch is at the junction of the postero-lateral aspect of the superior articular process and the superior, posterior, and medial edge of the sacral ala. Under fluoroscopic guidance, a Quincke needle was inserted until contact was made with os over the superior postero-lateral aspect of the pedicular shadow (target area). After negative aspiration for blood, 51mL of the nerve block solution was injected without difficulty or complication. The needle was removed intact. S1 Medial Branch Nerve Block (MBB): The target area for the S1 medial branch is at the posterior and inferior 6 o'clock position of the L5-S1 facet joint. Under fluoroscopic guidance, the Quincke needle inserted for the L5 MBB was redirected until contact was made with os over the inferior and postero aspect of the sacrum, at the 6 o' clock position under the L5-S1 facet joint (Target area). After negative aspiration for blood, 25mL of the nerve block solution was injected without difficulty or complication. The needle was removed intact.  Nerve  block solution: 12 cc solution made of 10 cc of 0.2% ropivacaine, 2 cc of Decadron 10 mg/cc.  2 cc injected  at each level above on the right.  Total steroid dose for right lumbar facet medial branch nerve blocks: 20 mg of Decadron.  The unused portion of the solution was discarded in the proper designated containers. Procedural Needles: 25-gauge, 3.5-inch, Quincke needles used for all levels.  Once the entire procedure was completed, the treated area was cleaned, making sure to leave some of the prepping solution back to take advantage of its long term bactericidal properties.   Illustration of the posterior view of the lumbar spine and the posterior neural structures. Laminae of L2 through S1 are labeled. DPRL5, dorsal primary ramus of L5; DPRS1, dorsal primary ramus of S1; DPR3, dorsal primary ramus of L3; FJ, facet (zygapophyseal) joint L3-L4; I, inferior articular process of L4; LB1, lateral branch of dorsal primary ramus of L1; IAB, inferior articular branches from L3 medial branch (supplies L4-L5 facet joint); IBP, intermediate branch plexus; MB3, medial branch of dorsal primary ramus of L3; NR3, third lumbar nerve root; S, superior articular process of L5; SAB, superior articular branches from L4 (supplies L4-5 facet joint also); TP3, transverse process of L3.  Vitals:   09/07/20 1254 09/07/20 1300 09/07/20 1304 09/07/20 1309  BP: (!) 123/93 134/75 109/72 114/71  Pulse:      Resp: 18 20 20 20   Temp:      TempSrc:      SpO2: 97% 97% 96% 96%  Weight:      Height:         Start Time: 1253 hrs. End Time: 1309 hrs.  Imaging Guidance (Spinal):          Type of Imaging Technique: Fluoroscopy Guidance (Spinal) Indication(s): Assistance in needle guidance and placement for procedures requiring needle placement in or near specific anatomical locations not easily accessible without such assistance. Exposure Time: Please see nurses notes. Contrast: None used. Fluoroscopic Guidance: I was  personally present during the use of fluoroscopy. "Tunnel Vision Technique" used to obtain the best possible view of the target area. Parallax error corrected before commencing the procedure. "Direction-depth-direction" technique used to introduce the needle under continuous pulsed fluoroscopy. Once target was reached, antero-posterior, oblique, and lateral fluoroscopic projection used confirm needle placement in all planes. Images permanently stored in EMR. Interpretation: No contrast injected. I personally interpreted the imaging intraoperatively. Adequate needle placement confirmed in multiple planes. Permanent images saved into the patient's record.   Post-operative Assessment:  Post-procedure Vital Signs:  Pulse/HCG Rate: 96(!) 101 Temp: (!) 96.4 F (35.8 C) Resp: 20 BP: 114/71 SpO2: 96 %  EBL: None  Complications: No immediate post-treatment complications observed by team, or reported by patient.  Note: The patient tolerated the entire procedure well. A repeat set of vitals were taken after the procedure and the patient was kept under observation following institutional policy, for this type of procedure. Post-procedural neurological assessment was performed, showing return to baseline, prior to discharge. The patient was provided with post-procedure discharge instructions, including a section on how to identify potential problems. Should any problems arise concerning this procedure, the patient was given instructions to immediately contact us, at any time, without hesitation. In any case, we plan to contact the patient by telephone for a follow-up status report regarding this interventional procedure.  Comments:  No additional relevant information.  Plan of Care  Orders:  Orders Placed This Encounter  Procedures  . [facility] C-ARM 1-60 MIN NO REPORT    Intraoperative interpretation by procedural physician at [facility].    Standing Status:  Standing    Number of  Occurrences:   1    Order Specific Question:   Reason for exam:    Answer:   Assistance in needle guidance and placement for procedures requiring needle placement in or near specific anatomical locations not easily accessible without such assistance.    Medications ordered for procedure: Meds ordered this encounter  Medications  . lidocaine (XYLOCAINE) 2 % (with pres) injection 400 mg  . ropivacaine (PF) 2 mg/mL (0.2%) (NAROPIN) injection 9 mL  . ropivacaine (PF) 2 mg/mL (0.2%) (NAROPIN) injection 9 mL  . dexamethasone (DECADRON) injection 10 mg  . dexamethasone (DECADRON) injection 10 mg  . methylPREDNISolone acetate (DEPO-MEDROL) injection 40 mg   Medications administered: We administered lidocaine, ropivacaine (PF) 2 mg/mL (0.2%), ropivacaine (PF) 2 mg/mL (0.2%), dexamethasone, dexamethasone, and methylPREDNISolone acetate.  See the medical record for exact dosing, route, and time of administration.  Follow-up plan:   Return for Keep sch. appt with Dr Dossie Arbour.     Recent Visits Date Type Provider Dept  08/30/20 Telemedicine Milinda Pointer, MD [facility]  07/18/20 Telemedicine Milinda Pointer, MD [facility]  07/05/20 Procedure visit Milinda Pointer, MD [facility]  06/20/20 Telemedicine Milinda Pointer, MD [facility]  Showing recent visits within past 90 days and meeting all other requirements Today's Visits Date Type Provider Dept  09/07/20 Procedure visit Gillis Santa, MD [facility]  Showing today's visits and meeting all other requirements Future Appointments Date Type Provider Dept  10/24/20 Appointment Milinda Pointer, MD [facility]  Showing future appointments within next 90 days and meeting all other requirements  Disposition: Discharge home  Discharge (Date  Time): 09/07/2020;   hrs.   Primary Care Physician: [name], MD Location: [facility]  Facility Note by: Gillis Santa, MD Date: 09/07/2020; Time: 2:17 PM  Disclaimer:  Medicine is not an exact science. The only guarantee in medicine is that nothing is guaranteed. It is important to note that the decision to proceed with this intervention was based on the information collected from the patient. The Data and conclusions were drawn from the patient's questionnaire, the interview, and the physical examination. Because the information was provided in large part by the patient, it cannot be guaranteed that it has not been purposely or unconsciously manipulated. Every effort has been made to obtain as much relevant data as possible for this evaluation. It is important to note that the conclusions that lead to this procedure are derived in large part from the available data. Always take into account that the treatment will also be dependent on availability of resources and existing treatment guidelines, considered by other Pain Management Practitioners as being common knowledge and practice, at the time of the intervention. For Medico-Legal purposes, it is also important to point out that variation in procedural techniques and pharmacological choices are the acceptable norm. The indications, contraindications, technique, and results of the above procedure should only be interpreted and judged by a Board-Certified Interventional Pain Specialist with extensive familiarity and expertise in the same exact procedure and technique.

## 2020-09-07 NOTE — Patient Instructions (Signed)

## 2020-09-08 ENCOUNTER — Telehealth: Payer: Self-pay

## 2020-09-08 NOTE — Telephone Encounter (Signed)
Post procedure phone call. Patient states she is feeling pretty good.

## 2020-09-09 ENCOUNTER — Ambulatory Visit (INDEPENDENT_AMBULATORY_CARE_PROVIDER_SITE_OTHER): Payer: Medicare Other

## 2020-09-09 VITALS — Ht 59.0 in | Wt 114.0 lb

## 2020-09-09 DIAGNOSIS — Z0000 Encounter for general adult medical examination without abnormal findings: Secondary | ICD-10-CM | POA: Diagnosis not present

## 2020-09-09 NOTE — Progress Notes (Signed)
Subjective:   Amanda Soto is a 87 y.o. female who presents for Medicare Annual (Subsequent) preventive examination.  Review of Systems    No ROS.  Medicare Wellness Virtual Visit.   Cardiac Risk Factors include: advanced age (>27men, >15 women);hypertension     Objective:    Today's Vitals   09/09/20 1342  Weight: 114 lb (51.7 kg)  Height: 4\' 11"  (1.499 m)   Body mass index is 23.03 kg/m.  Advanced Directives 09/09/2020 09/07/2020 02/04/2020 09/09/2019 07/21/2019 03/05/2019 09/08/2018  Does Patient Have a Medical Advance Directive? Yes No Yes Yes Yes No Yes  Type of Paramedic of [place];Living will - [place];Living will [place];Living will [place];Living will - [place];Living will  Does patient want to make changes to medical advance directive? No - Patient declined - - No - Patient declined - - No - Patient declined  Copy of [place] in Chart? No - copy requested - - No - copy requested - - No - copy requested  Would patient like information on creating a medical advance directive? - No - Patient declined - - - No - Patient declined -    Current Medications (verified) Outpatient Encounter Medications as of 09/09/2020  Medication Sig  . acetaminophen (TYLENOL) 325 MG tablet Take 500 mg by mouth every 6 (six) hours as needed for moderate pain, fever or headache.   . Cholecalciferol (VITAMIN D3) 50 MCG (2000 UT) CHEW Chew 1 tablet by mouth daily.  Marland Kitchen ELIQUIS 2.5 MG TABS tablet TAKE 1 TABLET BY MOUTH TWICE A DAY  . fluticasone (FLONASE) 50 MCG/ACT nasal spray SPRAY 2 SPRAYS INTO EACH NOSTRIL EVERY DAY  . furosemide (LASIX) 20 MG tablet TAKE 1 TABLET BY MOUTH EVERY DAY  . Multiple Vitamins-Minerals (PRESERVISION AREDS 2 PO) Take by mouth in the morning and at bedtime.  Marland Kitchen omeprazole (PRILOSEC) 40 MG capsule TAKE 1 CAPSULE BY MOUTH EVERY DAY 15-20 MINUTES BEFORE  MEALS  . sotalol (BETAPACE) 80 MG tablet Take 40 mg by mouth 2 (two) times daily.   Marland Kitchen SYNTHROID 50 MCG tablet TAKE 1 TABLET BY MOUTH EVERY DAY BEFORE BREAKFAST  . traMADol (ULTRAM) 50 MG tablet Take 1 tablet (50 mg total) by mouth 4 (four) times daily.  . vitamin B-12 (CYANOCOBALAMIN) 1000 MCG tablet Take by mouth.   No facility-administered encounter medications on file as of 09/09/2020.    Allergies (verified) Hydrocodone, Ciprofloxacin, and Latex   History: Past Medical History:  Diagnosis Date  . Acute postoperative pain 12/17/2016  . Anemia 11/10/2015  . Arthritis   . Atrial fibrillation ([place])   . CHF (congestive heart failure) ([place])   . Chickenpox   . Chronic abdominal pain 01/16/2014  . Closed Colles' fracture 12/17/2016  . Degenerative arthritis of hip 08/19/2014  . Degenerative arthritis of lumbar spine 11/18/2013  . Depression   . Diverticulitis   . GERD (gastroesophageal reflux disease)   . Heart murmur   . Heart rate slow   . Hyperlipidemia   . Hypertension   . Hypothyroid   . Neuritis or radiculitis due to rupture of lumbar intervertebral disc 11/18/2013  . Skin cancer 2016 ?   left lower leg  . Symptomatic anemia 11/09/2015  . Trochanteric bursitis of right hip 11/18/2013   Past Surgical History:  Procedure Laterality Date  . APPENDECTOMY    . CATARACT EXTRACTION    . PARTIAL HYSTERECTOMY    .  TONSILLECTOMY     Family History  Problem Relation Age of Onset  . Stroke Maternal Grandmother   . Breast cancer Mother   . Breast cancer Sister   . Stroke Sister   . Multiple sclerosis Sister   . Heart disease Other        Parent, grandparent, sons  . Hypertension Other        Parent, grandparent   Social History   Socioeconomic History  . Marital status: Widowed    Spouse name: Not on file  . Number of children: Not on file  . Years of education: Not on file  . Highest education level: Not on file  Occupational History  . Not on file  Tobacco Use  .  Smoking status: Never Smoker  . Smokeless tobacco: Never Used  Substance and Sexual Activity  . Alcohol use: Yes    Alcohol/week: 1.0 standard drink    Types: 1 Glasses of wine per week    Comment: rare  . Drug use: No  . Sexual activity: Never  Other Topics Concern  . Not on file  Social History Narrative  . Not on file   Social Determinants of Health   Financial Resource Strain: Low Risk   . Difficulty of Paying Living Expenses: Not hard at all  Food Insecurity: No Food Insecurity  . Worried About Charity fundraiser in the Last Year: Never true  . Ran Out of Food in the Last Year: Never true  Transportation Needs: No Transportation Needs  . Lack of Transportation (Medical): No  . Lack of Transportation (Non-Medical): No  Physical Activity: Unknown  . Days of Exercise per Week: 0 days  . Minutes of Exercise per Session: Not on file  Stress: No Stress Concern Present  . Feeling of Stress : Not at all  Social Connections: Unknown  . Frequency of Communication with Friends and Family: More than three times a week  . Frequency of Social Gatherings with Friends and Family: Not on file  . Attends Religious Services: More than 4 times per year  . Active Member of Clubs or Organizations: Yes  . Attends Archivist Meetings: Not on file  . Marital Status: Not on file    Tobacco Counseling Counseling given: Not Answered   Clinical Intake:  Pre-visit preparation completed: Yes        Diabetes: No  How often do you need to have someone help you when you read instructions, pamphlets, or other written materials from your doctor or pharmacy?: 4 - Often    Interpreter Needed?: No      Activities of Daily Living In your present state of health, do you have any difficulty performing the following activities: 09/09/2020  Hearing? N  Vision? Y  Difficulty concentrating or making decisions? N  Walking or climbing stairs? Y  Dressing or bathing? N  Doing  errands, shopping? Y  Preparing Food and eating ? Y  Comment Meals on wheels delivered. Self feeds.  Using the Toilet? N  In the past six months, have you accidently leaked urine? N  Do you have problems with loss of bowel control? N  Managing your Medications? N  Managing your Finances? N  Comment Plans to get family assist due to vision  Housekeeping or managing your Housekeeping? Y  Comment Maid assist x1 monthly.  Some recent data might be hidden    Patient Care Team: [name], MD as PCP - General (Family Medicine) [place], [place]  D, MD as Consulting Physician (Cardiology)  Indicate any recent Medical Services you may have received from other than [facility] providers in the past year (date may be approximate).     Assessment:   This is a routine wellness examination for Thresa.  I connected with Luciana today by telephone and verified that I am speaking with the correct person using two identifiers. Location patient: home Location provider: work Persons participating in the virtual visit: patient, Marine scientist.    I discussed the limitations, risks, security and privacy concerns of performing an evaluation and management service by telephone and the availability of in person appointments. The patient expressed understanding and verbally consented to this telephonic visit.    Interactive audio and video telecommunications were attempted between this provider and patient, however failed, due to patient having technical difficulties OR patient did not have access to video capability.  We continued and completed visit with audio only.  Some vital signs may be absent or patient reported.   Hearing/Vision screen No exam data present  Dietary issues and exercise activities discussed: Current Exercise Habits: The patient does not participate in regular exercise at present  Healthy diet Good water intake  Goals      Patient Stated   .  Healthy lifestyle (pt-stated)      Take  medication for pain before strenuous activity       Depression Screen PHQ 2/9 Scores 09/09/2020 09/07/2020 07/28/2020 02/26/2020 02/04/2020 09/30/2019 09/09/2019  PHQ - 2 Score 0 0 2 0 0 0 0  PHQ- 9 Score 0 - 5 - - - -  Exception Documentation - - - - - - -    Fall Risk Fall Risk  09/09/2020 09/07/2020 08/19/2020 07/28/2020 07/05/2020  Falls in the past year? 0 0 0 0 0  Comment - - - - -  Number falls in past yr: 0 - 0 0 -  Comment - - - - -  Injury with Fall? 0 - 0 0 -  Comment - - - - -  Risk for fall due to : Impaired balance/gait - Impaired balance/gait;Impaired mobility - -  Risk for fall due to: Comment - - - - -  Follow up Falls evaluation completed - Falls evaluation completed Falls evaluation completed -    FALL RISK PREVENTION PERTAINING TO THE HOME: Handrails in use when climbing stairs? Yes Home free of loose throw rugs in walkways, pet beds, electrical cords, etc? Yes  Adequate lighting in your home to reduce risk of falls? Yes   ASSISTIVE DEVICES UTILIZED TO PREVENT FALLS: Life alert? Yes  Use of a cane, walker or w/c? Yes , walker Grab bars in the bathroom? Yes  Shower chair or bench in shower? Yes  Elevated toilet seat or a handicapped toilet? Yes   TIMED UP AND GO: Was the test performed? No . Virtual visit.   Cognitive Function:  Patient is alert and oriented x3.  Denies difficulty focusing, making decisions, memory loss.  Enjoys audio tapes MMSE/6CIT deferred. Normal by direct communication/observation.    6CIT Screen 09/09/2020 09/09/2019 09/08/2018 07/26/2017 07/25/2016  What Year? 0 points 0 points 0 points 0 points 0 points  What month? 0 points 0 points 0 points 0 points 0 points  What time? 0 points 0 points 0 points 0 points 0 points  Count back from 20 0 points 0 points 0 points 0 points 0 points  Months in reverse 0 points 0 points 0 points 0 points 0 points  Repeat  phrase 0 points 0 points 0 points 0 points -  Total Score 0 0 0 0 -     Immunizations Immunization History  Administered Date(s) Administered  . Fluad Quad(high Dose 65+) 04/26/2020  . Influenza Split 04/22/2014  . Influenza, High Dose Seasonal PF 04/25/2016, 05/07/2017, 03/02/2018, 04/10/2019  . Influenza-Unspecified 05/02/2015, 04/10/2019  . PFIZER(Purple Top)SARS-COV-2 Vaccination 08/06/2019, 08/27/2019, 04/11/2020   PNA vaccine- Advised may receive this vaccine in office, at local pharmacy or Health Dept. Aware to provide a copy of the vaccination record if obtained from local pharmacy or Health Dept. Verbalized acceptance and understanding. Deferred.   TDAP status: Due, Education has been provided regarding the importance of this vaccine. Advised may receive this vaccine at local pharmacy or Health Dept. Aware to provide a copy of the vaccination record if obtained from local pharmacy or Health Dept. Verbalized acceptance and understanding. Deferred.   Health Maintenance Health Maintenance  Topic Date Due  . TETANUS/TDAP  09/09/2021 (Originally 02/10/1944)  . PNA vac Low Risk Adult (1 of 2 - PCV13) 09/09/2021 (Originally [birth year]/1992)  . COVID-19 Vaccine (4 - Booster for Pfizer series) 10/10/2020  . INFLUENZA VACCINE  Completed  . DEXA SCAN  Completed  . HPV VACCINES  Aged Out   Colorectal cancer screening: No longer required.   Mammogram status: No longer required due to aged out.  Bone Density status: Completed 10/05/19. Results reflect: Bone density results: OSTEOPOROSIS. Repeat every 2 years. Cholecalciferol (VITAMIN D3) 50 MCG (2000 UT) CHEW.   Lung Cancer Screening: (Low Dose CT Chest recommended if Age 76-80 years, 30 pack-year currently smoking OR have quit w/in 15years.) does not qualify.   Hepatitis C Screening: does not qualify.  Vision Screening: Recommended annual ophthalmology exams for early detection of glaucoma and other disorders of the eye. Is the patient up to date with their annual eye exam?  Yes  Visits every 6 months.    Dental Screening: Recommended annual dental exams for proper oral hygiene.  Community Resource Referral / Chronic Care Management: CRR required this visit?  No   CCM required this visit?  No      Plan:   Keep all routine maintenance appointments.   Follow up 11/16/20 @ 10:30  I have personally reviewed and noted the following in the patient's chart:   . Medical and social history . Use of alcohol, tobacco or illicit drugs  . Current medications and supplements . Functional ability and status . Nutritional status . Physical activity . Advanced directives . List of other physicians . Hospitalizations, surgeries, and ER visits in previous 12 months . Vitals . Screenings to include cognitive, depression, and falls . Referrals and appointments  In addition, I have reviewed and discussed with patient certain preventive protocols, quality metrics, and best practice recommendations. A written personalized care plan for preventive services as well as general preventive health recommendations were provided to patient via mychart.     Varney Biles, LPN   12/06/3708

## 2020-09-09 NOTE — Patient Instructions (Addendum)
Amanda Soto , Thank you for taking time to come for your Medicare Wellness Visit. I appreciate your ongoing commitment to your health goals. Please review the following plan we discussed and let me know if I can assist you in the future.   These are the goals we discussed: Goals      Patient Stated   .  Healthy lifestyle (pt-stated)      Take medication for pain before strenuous activity        This is a list of the screening recommended for you and due dates:  Health Maintenance  Topic Date Due  . Tetanus Vaccine  09/09/2021*  . Pneumonia vaccines (1 of 2 - PCV13) 09/09/2021*  . COVID-19 Vaccine (4 - Booster for Pfizer series) 10/10/2020  . Flu Shot  Completed  . DEXA scan (bone density measurement)  Completed  . HPV Vaccine  Aged Out  *Topic was postponed. The date shown is not the original due date.   Keep all routine maintenance appointments.   Follow up 11/16/20 @ 10:30  Advanced directives:   Conditions/risks identified: none new  Follow up in one year for your annual wellness visit    Preventive Care 65 Years and Older, Female Preventive care refers to lifestyle choices and visits with your health care provider that can promote health and wellness. What does preventive care include?  A yearly physical exam. This is also called an annual well check.  Dental exams once or twice a year.  Routine eye exams. Ask your health care provider how often you should have your eyes checked.  Personal lifestyle choices, including:  Daily care of your teeth and gums.  Regular physical activity.  Eating a healthy diet.  Avoiding tobacco and drug use.  Limiting alcohol use.  Practicing safe sex.  Taking low-dose aspirin every day.  Taking vitamin and mineral supplements as recommended by your health care provider. What happens during an annual well check? The services and screenings done by your health care provider during your annual well check will depend on your  age, overall health, lifestyle risk factors, and family history of disease. Counseling  Your health care provider may ask you questions about your:  Alcohol use.  Tobacco use.  Drug use.  Emotional well-being.  Home and relationship well-being.  Sexual activity.  Eating habits.  History of falls.  Memory and ability to understand (cognition).  Work and work Statistician.  Reproductive health. Screening  You may have the following tests or measurements:  Height, weight, and BMI.  Blood pressure.  Lipid and cholesterol levels. These may be checked every 5 years, or more frequently if you are over 63 years old.  Skin check.  Lung cancer screening. You may have this screening every year starting at age 64 if you have a 30-pack-year history of smoking and currently smoke or have quit within the past 15 years.  Fecal occult blood test (FOBT) of the stool. You may have this test every year starting at age 7.  Flexible sigmoidoscopy or colonoscopy. You may have a sigmoidoscopy every 5 years or a colonoscopy every 10 years starting at age 45.  Hepatitis C blood test.  Hepatitis B blood test.  Sexually transmitted disease (STD) testing.  Diabetes screening. This is done by checking your blood sugar (glucose) after you have not eaten for a while (fasting). You may have this done every 1-3 years.  Bone density scan. This is done to screen for osteoporosis. You may have this  done starting at age 49.  Mammogram. This may be done every 1-2 years. Talk to your health care provider about how often you should have regular mammograms. Talk with your health care provider about your test results, treatment options, and if necessary, the need for more tests. Vaccines  Your health care provider may recommend certain vaccines, such as:  Influenza vaccine. This is recommended every year.  Tetanus, diphtheria, and acellular pertussis (Tdap, Td) vaccine. You may need a Td booster  every 10 years.  Zoster vaccine. You may need this after age 23.  Pneumococcal 13-valent conjugate (PCV13) vaccine. One dose is recommended after age 39.  Pneumococcal polysaccharide (PPSV23) vaccine. One dose is recommended after age 45. Talk to your health care provider about which screenings and vaccines you need and how often you need them. This information is not intended to replace advice given to you by your health care provider. Make sure you discuss any questions you have with your health care provider. Document Released: 07/15/2015 Document Revised: 03/07/2016 Document Reviewed: 04/19/2015 Elsevier Interactive Patient Education  2017 [place] Prevention in the Home Falls can cause injuries. They can happen to people of all ages. There are many things you can do to make your home safe and to help prevent falls. What can I do on the outside of my home?  Regularly fix the edges of walkways and driveways and fix any cracks.  Remove anything that might make you trip as you walk through a door, such as a raised step or threshold.  Trim any bushes or trees on the path to your home.  Use bright outdoor lighting.  Clear any walking paths of anything that might make someone trip, such as rocks or tools.  Regularly check to see if handrails are loose or broken. Make sure that both sides of any steps have handrails.  Any raised decks and porches should have guardrails on the edges.  Have any leaves, snow, or ice cleared regularly.  Use sand or salt on walking paths during winter.  Clean up any spills in your garage right away. This includes oil or grease spills. What can I do in the bathroom?  Use night lights.  Install grab bars by the toilet and in the tub and shower. Do not use towel bars as grab bars.  Use non-skid mats or decals in the tub or shower.  If you need to sit down in the shower, use a plastic, non-slip stool.  Keep the floor dry. Clean up any  water that spills on the floor as soon as it happens.  Remove soap buildup in the tub or shower regularly.  Attach bath mats securely with double-sided non-slip rug tape.  Do not have throw rugs and other things on the floor that can make you trip. What can I do in the bedroom?  Use night lights.  Make sure that you have a light by your bed that is easy to reach.  Do not use any sheets or blankets that are too big for your bed. They should not hang down onto the floor.  Have a firm chair that has side arms. You can use this for support while you get dressed.  Do not have throw rugs and other things on the floor that can make you trip. What can I do in the kitchen?  Clean up any spills right away.  Avoid walking on wet floors.  Keep items that you use a lot in easy-to-reach places.  If you need to reach something above you, use a strong step stool that has a grab bar.  Keep electrical cords out of the way.  Do not use floor polish or wax that makes floors slippery. If you must use wax, use non-skid floor wax.  Do not have throw rugs and other things on the floor that can make you trip. What can I do with my stairs?  Do not leave any items on the stairs.  Make sure that there are handrails on both sides of the stairs and use them. Fix handrails that are broken or loose. Make sure that handrails are as long as the stairways.  Check any carpeting to make sure that it is firmly attached to the stairs. Fix any carpet that is loose or worn.  Avoid having throw rugs at the top or bottom of the stairs. If you do have throw rugs, attach them to the floor with carpet tape.  Make sure that you have a light switch at the top of the stairs and the bottom of the stairs. If you do not have them, ask someone to add them for you. What else can I do to help prevent falls?  Wear shoes that:  Do not have high heels.  Have rubber bottoms.  Are comfortable and fit you well.  Are closed  at the toe. Do not wear sandals.  If you use a stepladder:  Make sure that it is fully opened. Do not climb a closed stepladder.  Make sure that both sides of the stepladder are locked into place.  Ask someone to hold it for you, if possible.  Clearly mark and make sure that you can see:  Any grab bars or handrails.  First and last steps.  Where the edge of each step is.  Use tools that help you move around (mobility aids) if they are needed. These include:  Canes.  Walkers.  Scooters.  Crutches.  Turn on the lights when you go into a dark area. Replace any light bulbs as soon as they burn out.  Set up your furniture so you have a clear path. Avoid moving your furniture around.  If any of your floors are uneven, fix them.  If there are any pets around you, be aware of where they are.  Review your medicines with your doctor. Some medicines can make you feel dizzy. This can increase your chance of falling. Ask your doctor what other things that you can do to help prevent falls. This information is not intended to replace advice given to you by your health care provider. Make sure you discuss any questions you have with your health care provider. Document Released: 04/14/2009 Document Revised: 11/24/2015 Document Reviewed: 07/23/2014 Elsevier Interactive Patient Education  2017 [employer].

## 2020-09-19 ENCOUNTER — Other Ambulatory Visit: Payer: Self-pay

## 2020-09-19 ENCOUNTER — Ambulatory Visit: Payer: Medicare Other | Admitting: Podiatry

## 2020-09-19 ENCOUNTER — Encounter: Payer: Self-pay | Admitting: Podiatry

## 2020-09-19 DIAGNOSIS — D689 Coagulation defect, unspecified: Secondary | ICD-10-CM

## 2020-09-19 DIAGNOSIS — B351 Tinea unguium: Secondary | ICD-10-CM | POA: Diagnosis not present

## 2020-09-19 DIAGNOSIS — M79609 Pain in unspecified limb: Secondary | ICD-10-CM | POA: Diagnosis not present

## 2020-09-19 DIAGNOSIS — L608 Other nail disorders: Secondary | ICD-10-CM

## 2020-09-19 NOTE — Progress Notes (Signed)
This patient returns to my office for at risk foot care.  This patient requires this care by a professional since this patient will be at risk due to having  Coagulation defect.  Patient is taking eliquiss.  This patient is unable to cut nails herself since the patient cannot reach her  nails.These nails are painful walking and wearing shoes.  This patient presents for at risk foot care today. She presents to the office with her niece.  General Appearance  Alert, conversant and in no acute stress.  Vascular  Dorsalis pedis and posterior tibial  pulses are palpable  bilaterally.  Capillary return is within normal limits  bilaterally. Temperature is within normal limits  bilaterally.  Neurologic  Senn-Weinstein monofilament wire test within normal limits  bilaterally. Muscle power within normal limits bilaterally.  Nails Thick disfigured discolored nails with subungual debris  from hallux to fifth toes bilaterally. No evidence of bacterial infection or drainage bilaterally. Pincer nails.  Orthopedic  No limitations of motion  feet .  No crepitus or effusions noted.  No bony pathology or digital deformities noted.Hammer toes second.  Skin  normotropic skin with no porokeratosis noted bilaterally.  No signs of infections or ulcers noted.     Onychomycosis  Pain in right toes  Pain in left toes  Consent was obtained for treatment procedures.   Mechanical debridement of nails 1-5  bilaterally performed with a nail nipper.  Filed with dremel without incident. No infection or ulcer.     Return office visit 3 months         Told patient to return for periodic foot care and evaluation due to potential at risk complications.   Gardiner Barefoot DPM

## 2020-09-28 DIAGNOSIS — H353133 Nonexudative age-related macular degeneration, bilateral, advanced atrophic without subfoveal involvement: Secondary | ICD-10-CM | POA: Diagnosis not present

## 2020-10-23 NOTE — Progress Notes (Signed)
PROVIDER NOTE: Information contained herein reflects review and annotations entered in association with encounter. Interpretation of such information and data should be left to medically-trained personnel. Information provided to patient can be located elsewhere in the medical record under "Patient Instructions". Document created using STT-dictation technology, any transcriptional errors that may result from process are unintentional.    Patient: Amanda Soto  Service Category: E/M  Provider: Gaspar Cola, MD  DOB: August 18, 1924  DOS: 10/24/2020  Specialty: Interventional Pain Management  MRN: [id number]  Setting: Ambulatory outpatient  PCP: [name], MD  Type: Established Patient    Referring Provider: [name], MD  Location: Office  Delivery: Face-to-face     HPI  Amanda Soto, a 86 y.o. year old female, is here today because of her Chronic pain syndrome [G89.4]. Amanda Soto primary complain today is Back Pain (Low and right) Last encounter: My last encounter with her was on 07/05/2020. Pertinent problems: Amanda Soto has Degeneration of intervertebral disc of lumbar region; H/O neoplasm; Chronic low back pain (1ry area of Pain) (Right); Lumbar facet syndrome (Right); Grade 1 Anterolisthesis of L4 over L5 (5 mm); Lumbar spondylosis; Scoliosis of lumbar spine (concave to right side); Lumbar facet arthropathy; Osteoarthritis of hip (Right); Thoracic paracentral T7-8 disc protrusion; Chronic pain syndrome; Chronic sacroiliac joint pain (Right); DDD (degenerative disc disease), lumbar; Spondylosis without myelopathy or radiculopathy, lumbosacral region; Other specified dorsopathies, sacral and sacrococcygeal region; Headache; Chronic hip pain (Left); Chronic elbow pain (Right); Neurogenic pain; Coccygodynia; Traumatic fracture of coccyx, sequela; 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 Pain Assessment: Severity of Chronic pain is reported as a 2 /10. Location: Back Lower,Right/right leg anterior to knee. Onset: More than a month ago. Quality: Aching,Constant. Timing: Constant. Modifying factor(s): medications, rest, heat. Vitals:  height is 4' 11.5" (1.511 m) and weight is 113 lb (51.3 kg). Her temporal temperature is 97.2 F (36.2 C) (abnormal). Her blood pressure is 114/65 and her pulse is 110 (abnormal). Her respiration is 18 and oxygen saturation is 97%.   Reason for encounter: both, medication management and post-procedure assessment.   The patient indicates doing well with the current medication regimen. No adverse reactions or side effects reported to the medications.   Review of the [place] PMP shows that the prescription that I provided the patient on 07/05/2020 was first filled on 08/29/2020 and the second of 3 refills was completed on 10/13/2020 meaning that there is still a third of 3 refills that has not been failed.  This would suggest that the patient is not using 120 tablets/month.  Between 08/29/2020 and 10/13/2020 there were 45 days meaning that the patient is likely to have used 2.6 tablets/day, on the average.  This would suggest that the patient is using 80 tablets/month.  I will change her prescription to reflect this.  If she was to get her next prescription failed today, she should have enough medicine to last for another 45 days, meaning that her next prescription should be filled on 12/08/2020.   The patient indicates that the right-sided lumbar facet and SI joint injection did provide her with an ongoing 50% relief of her low back pain.  However, this pain continues to return.  By now we have already done 5 different diagnostic lumbar facet and SI joint injections on the right side and we have also done 3 radiofrequency  ablations of the right lumbar facets, the last one done on 03/27/2018, along with 1  right-sided sacroiliac joint radiofrequency ablation also done on 03/27/2018.  After that radiofrequency the patient appears to have enjoyed good relief of the pain until 07/21/2019 when he she had to return for a caudal epidural steroid injection treatment.  This would suggest that the patient might have obtained approximately 481 days of relief from the radiofrequency, which is over a year.  At this point, I believe that the patient would benefit from repeat radiofrequency ablation.  Statement of Medical Necessity:  Amanda Soto to experienced debilitating chronic nerve-associated pain from the Lumbosacral Facet Syndrome (Spondylosis without myelopathy or radiculopathy, lumbosacral region [M47.817]).  Duration: This pain has persisted for longer than three months.  Non-surgical care: The patient has either failed to respond, or was unable to tolerate, or simply did not get enough benefit from other more conservative therapies including, but not limited to: 1. Over-the-counter oral analgesic medications (i.e.: ibuprofen, naproxen, etc.) 2. Anti-inflammatory medications 3. Muscle relaxants 4. Membrane stabilizers 5. Opioids 6. Physical therapy (PT), chiropractic manipulation, and/or home exercise program (HEP). 7. Modalities (Heat, ice, etc.)  Invasive therapies: Prior radiofrequency procedures have proven to be effective in providing long-term (>6 months) benefit.  Surgical care: Not indicated.  Physical exam: Has been consistent with Lumbosacral Facet Syndrome.  Diagnostic imaging: Lumbosacral Facet Arthropathy.                Diagnostic interventional therapies: Amanda Soto has attained greater than 50% reduction in pain from at least two (2) diagnostic medial branch blocks conducted in separate occasions.  In addition she has had prior radiofrequency ablations with good benefit.  For the above listed reason, I believe, as the examining and treating physician, that it is medically  necessary to proceed with Non-Pulsed Radiofrequency Ablation for the purpose of attempting to prolong the duration of the benefits seen with the diagnostic injections.  RTCB: (03/08/2021)  Post-Procedure Evaluation  Procedure (07/05/2020): Palliative right lumbar facet block #9 + palliative right sacroiliac joint block #4 under fluoroscopic guidance, no sedation. Pre-procedure pain level: 5/10 Post-procedure: 3/10 Limited initial benefit, possibly due to rapid discharge after no sedation procedure, without enough time to allow full onset of block.  Sedation: None.  Effectiveness during initial hour after procedure(Ultra-Short Term Relief): 100 %.  Local anesthetic used: Long-acting (4-6 hours) Effectiveness: Defined as any analgesic benefit obtained secondary to the administration of local anesthetics. This carries significant diagnostic value as to the etiological location, or anatomical origin, of the pain. Duration of benefit is expected to coincide with the duration of the local anesthetic used.  Effectiveness during initial 4-6 hours after procedure(Short-Term Relief): 100 %.  Long-term benefit: Defined as any relief past the pharmacologic duration of the local anesthetics.  Effectiveness past the initial 6 hours after procedure(Long-Term Relief): 50 %.  Current benefits: Defined as benefit that persist at this time.   Analgesia:  The patient indicates currently enjoying a 50% improvement of her low back pain on the right side. Function: Somewhat improved.  At age 77, significant improvement in range of motion or function may be unrealistic. ROM: Somewhat improved  Pharmacotherapy Assessment   Analgesic: Tramadol 50 mg, 1 tablet PO q 6 hrs (200 mg/day of tramadol) MME/day: 20 mg/day.   Monitoring:  PMP: PDMP reviewed during this encounter.       Pharmacotherapy: No side-effects or adverse reactions reported. Compliance: No problems identified. Effectiveness: Clinically  acceptable.  Angelique Holm  Loletha Grayer, RN  10/24/2020 12:46 PM  Sign when Signing Visit Nursing Pain Medication Assessment:  Safety precautions to be maintained throughout the outpatient stay will include: orient to surroundings, keep bed in low position, maintain call bell within reach at all times, provide assistance with transfer out of bed and ambulation.  Medication Inspection Compliance: Pill count conducted under aseptic conditions, in front of the patient. Neither the pills nor the bottle was removed from the patient's sight at any time. Once count was completed pills were immediately returned to the patient in their original bottle.  Medication: Tramadol (Ultram) Pill/Patch Count: 105 of 120 pills remain Pill/Patch Appearance: Markings consistent with prescribed medication Bottle Appearance: Standard pharmacy container. Clearly labeled. Filled Date: 04 / 14 / 2022 Last Medication intake:  Today    UDS: No results found for: SUMMARY   ROS  Constitutional: Denies any fever or chills Gastrointestinal: No reported hemesis, hematochezia, vomiting, or acute GI distress Musculoskeletal: Denies any acute onset joint swelling, redness, loss of ROM, or weakness Neurological: No reported episodes of acute onset apraxia, aphasia, dysarthria, agnosia, amnesia, paralysis, loss of coordination, or loss of consciousness  Medication Review  Multiple Vitamins-Minerals, Vitamin D3, acetaminophen, apixaban, fluticasone, furosemide, levothyroxine, omeprazole, sotalol, traMADol, and vitamin B-12  History Review  Allergy: Amanda Soto is allergic to hydrocodone, ciprofloxacin, and latex. Drug: Amanda Soto  reports no history of drug use. Alcohol:  reports current alcohol use of about 1.0 standard drink of alcohol per week. Tobacco:  reports that she has never smoked. She has never used smokeless tobacco. Social: Amanda Soto  reports that she has never smoked. She has never used smokeless tobacco. She reports  current alcohol use of about 1.0 standard drink of alcohol per week. She reports that she does not use drugs. Medical:  has a past medical history of Acute postoperative pain (12/17/2016), Anemia (11/10/2015), Arthritis, Atrial fibrillation ([place]), CHF (congestive heart failure) ([place]), Chickenpox, Chronic abdominal pain (01/16/2014), Closed Colles' fracture (12/17/2016), Degenerative arthritis of hip (08/19/2014), Degenerative arthritis of lumbar spine (11/18/2013), Depression, Diverticulitis, GERD (gastroesophageal reflux disease), Heart murmur, Heart rate slow, Hyperlipidemia, Hypertension, Hypothyroid, Neuritis or radiculitis due to rupture of lumbar intervertebral disc (11/18/2013), Skin cancer (2016 ?), Symptomatic anemia (11/09/2015), and Trochanteric bursitis of right hip (11/18/2013). Surgical: Amanda Soto  has a past surgical history that includes Cataract extraction; Partial hysterectomy; Appendectomy; and Tonsillectomy. Family: family history includes Breast cancer in her mother and sister; Heart disease in an other family member; Hypertension in an other family member; Multiple sclerosis in her sister; Stroke in her maternal grandmother and sister.  Laboratory Chemistry Profile   Renal Lab Results  Component Value Date   BUN 14 05/11/2020   CREATININE 0.52 05/11/2020   LABCREA 90 01/11/2020   BCR 24 (H) 04/26/2020   GFR 80.19 05/11/2020   GFRAA >60 12/18/2019   GFRNONAA >60 12/18/2019     Hepatic Lab Results  Component Value Date   AST 19 04/26/2020   ALT 9 04/26/2020   ALBUMIN 4.2 04/26/2020   ALKPHOS 55 04/26/2020   LIPASE 181 11/02/2013     Electrolytes Lab Results  Component Value Date   NA 134 (L) 05/11/2020   K 4.0 05/11/2020   CL 97 05/11/2020   CALCIUM 9.3 05/11/2020   MG 2.0 03/30/2019   PHOS 3.1 04/26/2020     Bone Lab Results  Component Value Date   VD25OH 28.73 (L) 01/11/2020   25OHVITD1 14 (L) 03/30/2019   25OHVITD2 <1.0 03/30/2019  25OHVITD3 14 03/30/2019      Inflammation (CRP: Acute Phase) (ESR: Chronic Phase) Lab Results  Component Value Date   CRP <1 03/30/2019   ESRSEDRATE 50 (H) 03/30/2019       Note: Above Lab results reviewed.  Recent Imaging Review  DG PAIN CLINIC C-ARM 1-60 MIN NO REPORT Fluoro was used, but no Radiologist interpretation will be provided.  Please refer to "NOTES" tab for provider progress note. Note: Reviewed        Physical Exam  General appearance: Well nourished, well developed, and well hydrated. In no apparent acute distress Mental status: Alert, oriented x 3 (person, place, & time)       Respiratory: No evidence of acute respiratory distress Eyes: PERLA Vitals: BP 114/65   Pulse (!) 110   Temp (!) 97.2 F (36.2 C) (Temporal)   Resp 18   Ht 4' 11.5" (1.511 m)   Wt 113 lb (51.3 kg)   SpO2 97%   BMI 22.44 kg/m  BMI: Estimated body mass index is 22.44 kg/m as calculated from the following:   Height as of this encounter: 4' 11.5" (1.511 m).   Weight as of this encounter: 113 lb (51.3 kg). Ideal: Patient must be at least 60 in tall to calculate ideal body weight  Assessment   Status Diagnosis  Controlled Controlled Controlled 1. Chronic pain syndrome   2. Chronic low back pain (1ry area of Pain) (Right)   3. Grade 1 Anterolisthesis of L4 over L5 (5 mm)   4. Degeneration of intervertebral disc of lumbar region   5. 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 Pharmacologic therapy   7. Chronic use of opiate for therapeutic purpose   8. Uncomplicated opioid dependence ([place])   9. Lumbar facet syndrome (Right)   10. Lumbar facet arthropathy   11. Chronic anticoagulation (Eliquis)      Updated Problems: Problem  Chronic Use of Opiate for Therapeutic Purpose  Acute Exacerbation of Chronic Low Back Pain (Resolved)    Plan of Care  Problem-specific:  No problem-specific Assessment & Plan notes found for this encounter.  Amanda Soto has a  current medication list which includes the following long-term medication(s): eliquis, fluticasone, furosemide, sotalol, synthroid, and [START ON 12/08/2020] tramadol.  Pharmacotherapy (Medications Ordered): Meds ordered this encounter  Medications  . traMADol (ULTRAM) 50 MG tablet    Sig: Take 1 tablet (50 mg total) by mouth every 6 (six) hours as needed for severe pain. Each refill must last 30 days    Dispense:  80 tablet    Refill:  2    Not a duplicate. Do NOT delete! Dispense 1 day early if closed on refill date. Avoid benzodiazepines within 8 hours of opioids. Do not send refill requests.   Orders:  Orders Placed This Encounter  Procedures  . Radiofrequency,Lumbar    Standing Status:   Future    Standing Expiration Date:   10/24/2021    Scheduling Instructions:     Side(s): Right-sided     Level: L3-4, L4-5, & L5-S1 Facets (L2, L3, L4, L5, & S1 Medial Branch Nerves)     Sedation: Patient's choice.     Scheduling Timeframe: As soon as pre-approved    Order Specific Question:   Where will this procedure be performed?    Answer:   [facility]  . ToxASSURE Select 13 (MW), Urine    Volume: 30 ml(s). Minimum 3 ml of urine is  needed. Document temperature of fresh sample. Indications: Long term (current) use of opiate analgesic (V67.209)    Order Specific Question:   Release to patient    Answer:   Immediate  . Blood Thinner Instructions to Nursing    Always make sure patient has clearance from prescribing physician to stop blood thinners for interventional therapies. If the patient requires a Lovenox-bridge therapy, make sure arrangements are made to institute it with the assistance of the PCP.    Scheduling Instructions:     Have Amanda Soto stop the Eliquis (Apixaban) x 3 days prior to procedure or surgery.   Follow-up plan:   Return in about 4 months (around 03/08/2021) for (F2F), (MM), in addition, RFA (82mn): (R) L-FCT RFA #3, (Blood Thinner Protocol).       Interventional Therapies  Risk  Complexity Considerations:   NOTE:Eliquis Anticoagulation(Stop:3 days Restart: 6 hrs)  Advanced age  Latex allergy   Planned  Pending:   Palliative right lumbar facet RFA #3    Under consideration:   Palliative interventions    Completed:   Palliative/therapeutic caudal ESI x1 (07/21/2019) (100/100/90/90)  Palliative right lumbar facet (L2, L3, L4, L5, & S1) MBBx9(09/07/2020) (100/100/90/75)  Palliative/Therapeuticright lumbar facet RFA x2(03/27/2018) (100/100/100/90)  Palliative right sacroiliac joint blockx4(09/07/2020)  Palliative/therapeutic right-sidedSIjoint RFAx1 (03/27/2018) (100/90/0)    Therapeutic  Palliative (PRN) options:   Palliative/therapeutic caudal ESI #2  Palliative right lumbar facet block#10 Palliative/Therapeuticright lumbar facet RFA #3 Palliative right sacroiliac joint block#5 Palliative/therapeutic right-sidedSIjoint RFA#2     Recent Visits Date Type Provider Dept  09/07/20 Procedure visit LGillis Santa MD [facility] 08/30/20 Telemedicine NMilinda Pointer MD [facility]  Showing recent visits within past 90 days and meeting all other requirements Today's Visits Date Type Provider Dept  10/24/20 Office Visit NMilinda Pointer MD [facility]  Showing today's visits and meeting all other requirements Future Appointments No visits were found meeting these conditions. Showing future appointments within next 90 days and meeting all other requirements  I discussed the assessment and treatment plan with the patient. The patient was provided an opportunity to ask questions and all were answered. The patient agreed with the plan and demonstrated an understanding of the instructions.  Patient advised to call back or seek an in-person evaluation if the symptoms or condition worsens.  Duration of encounter: 30 minutes.  Note by: FGaspar Cola MD Date: 10/24/2020;  Time: 7:09 PM

## 2020-10-24 ENCOUNTER — Ambulatory Visit: Payer: Medicare Other | Attending: Pain Medicine | Admitting: Pain Medicine

## 2020-10-24 ENCOUNTER — Other Ambulatory Visit: Payer: Self-pay

## 2020-10-24 ENCOUNTER — Encounter: Payer: Self-pay | Admitting: Pain Medicine

## 2020-10-24 VITALS — BP 114/65 | HR 110 | Temp 97.2°F | Resp 18 | Ht 59.5 in | Wt 113.0 lb

## 2020-10-24 DIAGNOSIS — Z7901 Long term (current) use of anticoagulants: Secondary | ICD-10-CM | POA: Diagnosis not present

## 2020-10-24 DIAGNOSIS — G8929 Other chronic pain: Secondary | ICD-10-CM | POA: Diagnosis not present

## 2020-10-24 DIAGNOSIS — M545 Low back pain, unspecified: Secondary | ICD-10-CM | POA: Diagnosis not present

## 2020-10-24 DIAGNOSIS — M47816 Spondylosis without myelopathy or radiculopathy, lumbar region: Secondary | ICD-10-CM

## 2020-10-24 DIAGNOSIS — Z79891 Long term (current) use of opiate analgesic: Secondary | ICD-10-CM

## 2020-10-24 DIAGNOSIS — M8088XA Other osteoporosis with current pathological fracture, vertebra(e), initial encounter for fracture: Secondary | ICD-10-CM

## 2020-10-24 DIAGNOSIS — M5136 Other intervertebral disc degeneration, lumbar region: Secondary | ICD-10-CM | POA: Insufficient documentation

## 2020-10-24 DIAGNOSIS — F1120 Opioid dependence, uncomplicated: Secondary | ICD-10-CM | POA: Diagnosis present

## 2020-10-24 DIAGNOSIS — M4310 Spondylolisthesis, site unspecified: Secondary | ICD-10-CM

## 2020-10-24 DIAGNOSIS — Z79899 Other long term (current) drug therapy: Secondary | ICD-10-CM | POA: Diagnosis not present

## 2020-10-24 DIAGNOSIS — M51369 Other intervertebral disc degeneration, lumbar region without mention of lumbar back pain or lower extremity pain: Secondary | ICD-10-CM

## 2020-10-24 DIAGNOSIS — G894 Chronic pain syndrome: Secondary | ICD-10-CM | POA: Diagnosis not present

## 2020-10-24 MED ORDER — TRAMADOL HCL 50 MG PO TABS: 50.0000 mg | ORAL_TABLET | Freq: Four times a day (QID) | ORAL | 2 refills | Status: DC | PRN

## 2020-10-24 NOTE — Patient Instructions (Addendum)
____________________________________________________________________________________________  Medication Recommendations and Reminders  Applies to: All patients receiving prescriptions (written and/or electronic).  Medication Rules & Regulations: These rules and regulations exist for your safety and that of others. They are not flexible and neither are we. Dismissing or ignoring them will be considered "non-compliance" with medication therapy, resulting in complete and irreversible termination of such therapy. (See document titled "Medication Rules" for more details.) In all conscience, because of safety reasons, we cannot continue providing a therapy where the patient does not follow instructions.  Pharmacy of record:   Definition: This is the pharmacy where your electronic prescriptions will be sent.   We do not endorse any particular pharmacy, however, we have experienced problems with [facility] not securing enough medication supply for the community.  We do not restrict you in your choice of pharmacy. However, once we write for your prescriptions, we will NOT be re-sending more prescriptions to fix restricted supply problems created by your pharmacy, or your insurance.   The pharmacy listed in the electronic medical record should be the one where you want electronic prescriptions to be sent.  If you choose to change pharmacy, simply notify our nursing staff.  Recommendations:  Keep all of your pain medications in a safe place, under lock and key, even if you live alone. We will NOT replace lost, stolen, or damaged medication.  After you fill your prescription, take 1 week's worth of pills and put them away in a safe place. You should keep a separate, properly labeled bottle for this purpose. The remainder should be kept in the original bottle. Use this as your primary supply, until it runs out. Once it's gone, then you know that you have 1 week's worth of medicine, and it is time to come  in for a prescription refill. If you do this correctly, it is unlikely that you will ever run out of medicine.  To make sure that the above recommendation works, it is very important that you make sure your medication refill appointments are scheduled at least 1 week before you run out of medicine. To do this in an effective manner, make sure that you do not leave the office without scheduling your next medication management appointment. Always ask the nursing staff to show you in your prescription , when your medication will be running out. Then arrange for the receptionist to get you a return appointment, at least 7 days before you run out of medicine. Do not wait until you have 1 or 2 pills left, to come in. This is very poor planning and does not take into consideration that we may need to cancel appointments due to bad weather, sickness, or emergencies affecting our staff.  DO NOT ACCEPT A "Partial Fill": If for any reason your pharmacy does not have enough pills/tablets to completely fill or refill your prescription, do not allow for a "partial fill". The law allows the pharmacy to complete that prescription within 72 hours, without requiring a new prescription. If they do not fill the rest of your prescription within those 72 hours, you will need a separate prescription to fill the remaining amount, which we will NOT provide. If the reason for the partial fill is your insurance, you will need to talk to the pharmacist about payment alternatives for the remaining tablets, but again, DO NOT ACCEPT A PARTIAL FILL, unless you can trust your pharmacist to obtain the remainder of the pills within 72 hours.  Prescription refills and/or changes in medication(s):     Prescription refills, and/or changes in dose or medication, will be conducted only during scheduled medication management appointments. (Applies to both, written and electronic prescriptions.)  No refills on procedure days. No medication will be  changed or started on procedure days. No changes, adjustments, and/or refills will be conducted on a procedure day. Doing so will interfere with the diagnostic portion of the procedure.  No phone refills. No medications will be "called into the pharmacy".  No Fax refills.  No weekend refills.  No Holliday refills.  No after hours refills.  Remember:  Business hours are:  Monday to Thursday 8:00 AM to 4:00 PM Provider's Schedule: Milinda Pointer, MD - Appointments are:  Medication management: Monday and Wednesday 8:00 AM to 4:00 PM Procedure day: Tuesday and Thursday 7:30 AM to 4:00 PM Gillis Santa, MD - Appointments are:  Medication management: Tuesday and Thursday 8:00 AM to 4:00 PM Procedure day: Monday and Wednesday 7:30 AM to 4:00 PM (Last update: 01/20/2020) ____________________________________________________________________________________________   ____________________________________________________________________________________________  Medication Rules  Purpose: To inform patients, and their family members, of our rules and regulations.  Applies to: All patients receiving prescriptions (written or electronic).  Pharmacy of record: Pharmacy where electronic prescriptions will be sent. If written prescriptions are taken to a different pharmacy, please inform the nursing staff. The pharmacy listed in the electronic medical record should be the one where you would like electronic prescriptions to be sent.  Electronic prescriptions: In compliance with the [place] (STOP) Act of 2017 (Session Lanny Cramp [phone]), effective July 02, 2018, all controlled substances must be electronically prescribed. Calling prescriptions to the pharmacy will cease to exist.  Prescription refills: Only during scheduled appointments. Applies to all prescriptions.  NOTE: The following applies primarily to controlled substances (Opioid* Pain  Medications).   Type of encounter (visit): For patients receiving controlled substances, face-to-face visits are required. (Not an option or up to the patient.)  Patient's responsibilities: 1. Pain Pills: Bring all pain pills to every appointment (except for procedure appointments). 2. Pill Bottles: Bring pills in original pharmacy bottle. Always bring the newest bottle. Bring bottle, even if empty. 3. Medication refills: You are responsible for knowing and keeping track of what medications you take and those you need refilled. The day before your appointment: write a list of all prescriptions that need to be refilled. The day of the appointment: give the list to the admitting nurse. Prescriptions will be written only during appointments. No prescriptions will be written on procedure days. If you forget a medication: it will not be "Called in", "Faxed", or "electronically sent". You will need to get another appointment to get these prescribed. No early refills. Do not call asking to have your prescription filled early. 4. Prescription Accuracy: You are responsible for carefully inspecting your prescriptions before leaving our office. Have the discharge nurse carefully go over each prescription with you, before taking them home. Make sure that your name is accurately spelled, that your address is correct. Check the name and dose of your medication to make sure it is accurate. Check the number of pills, and the written instructions to make sure they are clear and accurate. Make sure that you are given enough medication to last until your next medication refill appointment. 5. Taking Medication: Take medication as prescribed. When it comes to controlled substances, taking less pills or less frequently than prescribed is permitted and encouraged. Never take more pills than instructed. Never take medication more frequently than prescribed.  6.  Inform other Doctors: Always inform, all of your healthcare  providers, of all the medications you take. 7. Pain Medication from other Providers: You are not allowed to accept any additional pain medication from any other Doctor or Healthcare provider. There are two exceptions to this rule. (see below) In the event that you require additional pain medication, you are responsible for notifying us, as stated below. 8. Cough Medicine: Often these contain an opioid, such as codeine or hydrocodone. Never accept or take cough medicine containing these opioids if you are already taking an opioid* medication. The combination may cause respiratory failure and death. 9. Medication Agreement: You are responsible for carefully reading and following our Medication Agreement. This must be signed before receiving any prescriptions from our practice. Safely store a copy of your signed Agreement. Violations to the Agreement will result in no further prescriptions. (Additional copies of our Medication Agreement are available upon request.) 10. Laws, Rules, & Regulations: All patients are expected to follow all Federal and State Laws, Statutes, Rules, & Regulations. Ignorance of the Laws does not constitute a valid excuse.  11. Illegal drugs and Controlled Substances: The use of illegal substances (including, but not limited to marijuana and its derivatives) and/or the illegal use of any controlled substances is strictly prohibited. Violation of this rule may result in the immediate and permanent discontinuation of any and all prescriptions being written by our practice. The use of any illegal substances is prohibited. 12. Adopted CDC guidelines & recommendations: Target dosing levels will be at or below 60 MME/day. Use of benzodiazepines** is not recommended.  Exceptions: There are only two exceptions to the rule of not receiving pain medications from other Healthcare Providers. 1. Exception #1 (Emergencies): In the event of an emergency (i.e.: accident requiring emergency care), you  are allowed to receive additional pain medication. However, you are responsible for: As soon as you are able, call our office [phone], at any time of the day or night, and leave a message stating your name, the date and nature of the emergency, and the name and dose of the medication prescribed. In the event that your call is answered by a member of our staff, make sure to document and save the date, time, and the name of the person that took your information.  2. Exception #2 (Planned Surgery): In the event that you are scheduled by another doctor or dentist to have any type of surgery or procedure, you are allowed (for a period no longer than 30 days), to receive additional pain medication, for the acute post-op pain. However, in this case, you are responsible for picking up a copy of our "Post-op Pain Management for Surgeons" handout, and giving it to your surgeon or dentist. This document is available at our office, and does not require an appointment to obtain it. Simply go to our office during business hours (Monday-Thursday from 8:00 AM to 4:00 PM) (Friday 8:00 AM to 12:00 Noon) or if you have a scheduled appointment with us, prior to your surgery, and ask for it by name. In addition, you are responsible for: calling our office [phone], at any time of the day or night, and leaving a message stating your name, name of your surgeon, type of surgery, and date of procedure or surgery. Failure to comply with your responsibilities may result in termination of therapy involving the controlled substances.  *Opioid medications include: morphine, codeine, oxycodone, oxymorphone, hydrocodone, hydromorphone, meperidine, tramadol, tapentadol, buprenorphine, fentanyl, methadone. **Benzodiazepine medications include:   diazepam (Valium), alprazolam (Xanax), clonazepam (Klonopine), lorazepam (Ativan), clorazepate (Tranxene), chlordiazepoxide (Librium), estazolam (Prosom), oxazepam (Serax), temazepam  (Restoril), triazolam (Halcion) (Last updated: 05/30/2020) ____________________________________________________________________________________________   ____________________________________________________________________________________________  Blood Thinners  IMPORTANT NOTICE:  If you take any of these, make sure to notify the nursing staff.  Failure to do so may result in injury.  Recommended time intervals to stop and restart blood-thinners, before & after invasive procedures  Generic Name Brand Name Stop Time. Must be stopped at least this long before procedures. After procedures, wait at least this long before re-starting.  Abciximab Reopro 15 days 2 hrs  Alteplase Activase 10 days 10 days  Anagrelide Agrylin    Apixaban Eliquis 3 days 6 hrs  Cilostazol Pletal 3 days 5 hrs  Clopidogrel Plavix 7-10 days 2 hrs  Dabigatran Pradaxa 5 days 6 hrs  Dalteparin Fragmin 24 hours 4 hrs  Dipyridamole Aggrenox 11days 2 hrs  Edoxaban Lixiana; Savaysa 3 days 2 hrs  Enoxaparin  Lovenox 24 hours 4 hrs  Eptifibatide Integrillin 8 hours 2 hrs  Fondaparinux  Arixtra 72 hours 12 hrs  Hydroxychloroquine Plaquenil 11 days   Prasugrel Effient 7-10 days 6 hrs  Reteplase Retavase 10 days 10 days  Rivaroxaban Xarelto 3 days 6 hrs  Ticagrelor Brilinta 5-7 days 6 hrs  Ticlopidine Ticlid 10-14 days 2 hrs  Tinzaparin Innohep 24 hours 4 hrs  Tirofiban Aggrastat 8 hours 2 hrs  Warfarin Coumadin 5 days 2 hrs   Other medications with blood-thinning effects  Product indications Generic (Brand) names Note  Cholesterol Lipitor Stop 4 days before procedure  Blood thinner (injectable) Heparin (LMW or LMWH Heparin) Stop 24 hours before procedure  Cancer Ibrutinib (Imbruvica) Stop 7 days before procedure  Malaria/Rheumatoid Hydroxychloroquine (Plaquenil) Stop 11 days before procedure  Thrombolytics  10 days before or after procedures   Over-the-counter (OTC) Products with blood-thinning effects  Product  Common names Stop Time  Aspirin > 325 mg Goody Powders, Excedrin, etc. 11 days  Aspirin ? 81 mg  7 days  Fish oil  4 days  Garlic supplements  7 days  Ginkgo biloba  36 hours  Ginseng  24 hours  NSAIDs Ibuprofen, Naprosyn, etc. 3 days  Vitamin E  4 days   ____________________________________________________________________________________________  ____________________________________________________________________________________________  General Risks and Possible Complications  Patient Responsibilities: It is important that you read this as it is part of your informed consent. It is our duty to inform you of the risks and possible complications associated with treatments offered to you. It is your responsibility as a patient to read this and to ask questions about anything that is not clear or that you believe was not covered in this document.  Patient's Rights: You have the right to refuse treatment. You also have the right to change your mind, even after initially having agreed to have the treatment done. However, under this last option, if you wait until the last second to change your mind, you may be charged for the materials used up to that point.  Introduction: Medicine is not an Chief Strategy Officer. Everything in Medicine, including the lack of treatment(s), carries the potential for danger, harm, or loss (which is by definition: Risk). In Medicine, a complication is a secondary problem, condition, or disease that can aggravate an already existing one. All treatments carry the risk of possible complications. The fact that a side effects or complications occurs, does not imply that the treatment was conducted incorrectly. It must be clearly understood that these can happen even  when everything is done following the highest safety standards.  No treatment: You can choose not to proceed with the proposed treatment alternative. The "PRO(s)" would include: avoiding the risk of complications  associated with the therapy. The "CON(s)" would include: not getting any of the treatment benefits. These benefits fall under one of three categories: diagnostic; therapeutic; and/or palliative. Diagnostic benefits include: getting information which can ultimately lead to improvement of the disease or symptom(s). Therapeutic benefits are those associated with the successful treatment of the disease. Finally, palliative benefits are those related to the decrease of the primary symptoms, without necessarily curing the condition (example: decreasing the pain from a flare-up of a chronic condition, such as incurable terminal cancer).  General Risks and Complications: These are associated to most interventional treatments. They can occur alone, or in combination. They fall under one of the following six (6) categories: no benefit or worsening of symptoms; bleeding; infection; nerve damage; allergic reactions; and/or death. 1. No benefits or worsening of symptoms: In Medicine there are no guarantees, only probabilities. No healthcare provider can ever guarantee that a medical treatment will work, they can only state the probability that it may. Furthermore, there is always the possibility that the condition may worsen, either directly, or indirectly, as a consequence of the treatment. 2. Bleeding: This is more common if the patient is taking a blood thinner, either prescription or over the counter (example: Goody Powders, Fish oil, Aspirin, Garlic, etc.), or if suffering a condition associated with impaired coagulation (example: Hemophilia, cirrhosis of the liver, low platelet counts, etc.). However, even if you do not have one on these, it can still happen. If you have any of these conditions, or take one of these drugs, make sure to notify your treating physician. 3. Infection: This is more common in patients with a compromised immune system, either due to disease (example: diabetes, cancer, human immunodeficiency  virus [HIV], etc.), or due to medications or treatments (example: therapies used to treat cancer and rheumatological diseases). However, even if you do not have one on these, it can still happen. If you have any of these conditions, or take one of these drugs, make sure to notify your treating physician. 4. Nerve Damage: This is more common when the treatment is an invasive one, but it can also happen with the use of medications, such as those used in the treatment of cancer. The damage can occur to small secondary nerves, or to large primary ones, such as those in the spinal cord and brain. This damage may be temporary or permanent and it may lead to impairments that can range from temporary numbness to permanent paralysis and/or brain death. 5. Allergic Reactions: Any time a substance or material comes in contact with our body, there is the possibility of an allergic reaction. These can range from a mild skin rash (contact dermatitis) to a severe systemic reaction (anaphylactic reaction), which can result in death. 6. Death: In general, any medical intervention can result in death, most of the time due to an unforeseen complication. ____________________________________________________________________________________________  ____________________________________________________________________________________________  Preparing for Procedure with Sedation  Procedure appointments are limited to planned procedures: . No Prescription Refills. . No disability issues will be discussed. . No medication changes will be discussed.  Instructions: . Oral Intake: Do not eat or drink anything for at least 8 hours prior to your procedure. (Exception: Blood Pressure Medication. See below.) . Transportation: Unless otherwise stated by your physician, you may drive yourself after the procedure. Marland Kitchen  Blood Pressure Medicine: Do not forget to take your blood pressure medicine with a sip of water the morning of the  procedure. If your Diastolic (lower reading)is above 100 mmHg, elective cases will be cancelled/rescheduled. . Blood thinners: These will need to be stopped for procedures. Notify our staff if you are taking any blood thinners. Depending on which one you take, there will be specific instructions on how and when to stop it. . Diabetics on insulin: Notify the staff so that you can be scheduled 1st case in the morning. If your diabetes requires high dose insulin, take only  of your normal insulin dose the morning of the procedure and notify the staff that you have done so. . Preventing infections: Shower with an antibacterial soap the morning of your procedure. . Build-up your immune system: Take 1000 mg of Vitamin C with every meal (3 times a day) the day prior to your procedure. Marland Kitchen Antibiotics: Inform the staff if you have a condition or reason that requires you to take antibiotics before dental procedures. . Pregnancy: If you are pregnant, call and cancel the procedure. . Sickness: If you have a cold, fever, or any active infections, call and cancel the procedure. . Arrival: You must be in the facility at least 30 minutes prior to your scheduled procedure. . Children: Do not bring children with you. . Dress appropriately: Bring dark clothing that you would not mind if they get stained. . Valuables: Do not bring any jewelry or valuables.  Reasons to call and reschedule or cancel your procedure: (Following these recommendations will minimize the risk of a serious complication.) . Surgeries: Avoid having procedures within 2 weeks of any surgery. (Avoid for 2 weeks before or after any surgery). . Flu Shots: Avoid having procedures within 2 weeks of a flu shots or . (Avoid for 2 weeks before or after immunizations). . Barium: Avoid having a procedure within 7-10 days after having had a radiological study involving the use of radiological contrast. (Myelograms, Barium swallow or enema study). . Heart  attacks: Avoid any elective procedures or surgeries for the initial 6 months after a "Myocardial Infarction" (Heart Attack). . Blood thinners: It is imperative that you stop these medications before procedures. Let us know if you if you take any blood thinner.  . Infection: Avoid procedures during or within two weeks of an infection (including chest colds or gastrointestinal problems). Symptoms associated with infections include: Localized redness, fever, chills, night sweats or profuse sweating, burning sensation when voiding, cough, congestion, stuffiness, runny nose, sore throat, diarrhea, nausea, vomiting, cold or Flu symptoms, recent or current infections. It is specially important if the infection is over the area that we intend to treat. Marland Kitchen Heart and lung problems: Symptoms that may suggest an active cardiopulmonary problem include: cough, chest pain, breathing difficulties or shortness of breath, dizziness, ankle swelling, uncontrolled high or unusually low blood pressure, and/or palpitations. If you are experiencing any of these symptoms, cancel your procedure and contact your primary care physician for an evaluation.  Remember:  Regular Business hours are:  Monday to Thursday 8:00 AM to 4:00 PM  Provider's Schedule: Milinda Pointer, MD:  Procedure days: Tuesday and Thursday 7:30 AM to 4:00 PM  Gillis Santa, MD:  Procedure days: Monday and Wednesday 7:30 AM to 4:00 PM ____________________________________________________________________________________________   Pain Management Discharge Instructions  General Discharge Instructions :  If you need to reach your doctor call: Monday-Friday 8:00 am - 4:00 pm at [phone] or toll free [phone].  After clinic hours [phone] to have operator reach doctor.  Bring all of your medication bottles to all your appointments in the pain clinic.  To cancel or reschedule your appointment with Pain Management please remember to call 24  hours in advance to avoid a fee.  Refer to the educational materials which you have been given on: General Risks, I had my Procedure. Discharge Instructions, Post Sedation.  Post Procedure Instructions:  The drugs you were given will stay in your system until tomorrow, so for the next 24 hours you should not drive, make any legal decisions or drink any alcoholic beverages.  You may eat anything you prefer, but it is better to start with liquids then soups and crackers, and gradually work up to solid foods.  Please notify your doctor immediately if you have any unusual bleeding, trouble breathing or pain that is not related to your normal pain.  Depending on the type of procedure that was done, some parts of your body may feel week and/or numb.  This usually clears up by tonight or the next day.  Walk with the use of an assistive device or accompanied by an adult for the 24 hours.  You may use ice on the affected area for the first 24 hours.  Put ice in a Ziploc bag and cover with a towel and place against area 15 minutes on 15 minutes off.  You may switch to heat after 24 hours. Radiofrequency Lesioning Radiofrequency lesioning is a procedure that is performed to relieve pain. The procedure is often used for back, neck, or arm pain. Radiofrequency lesioning involves the use of a machine that creates radio waves to make heat. During the procedure, the heat is applied to the nerve that carries the pain signal. The heat damages the nerve and interferes with the pain signal. Pain relief usually starts about 2 weeks after the procedure and lasts for 6 months to 1 year. You will be awake during the procedure. You will need to be able to talk with the health care provider during the procedure. Tell a health care provider about:  Any allergies you have.  All medicines you are taking, including vitamins, herbs, eye drops, creams, and over-the-counter medicines.  Any problems you or family members  have had with anesthetic medicines.  Any blood disorders you have.  Any surgeries you have had.  Any medical conditions you have or have had.  Whether you are pregnant or may be pregnant. What are the risks? Generally, this is a safe procedure. However, problems may occur, including:  Pain or soreness at the injection site.  Allergic reaction to medicines given during the procedure.  Bleeding.  Infection at the injection site.  Damage to nerves or blood vessels. What happens before the procedure? Staying hydrated Follow instructions from your health care provider about hydration, which may include:  Up to 2 hours before the procedure - you may continue to drink clear liquids, such as water, clear fruit juice, black coffee, and plain tea. Eating and drinking Follow instructions from your health care provider about eating and drinking, which may include:  8 hours before the procedure - stop eating heavy meals or foods, such as meat, fried foods, or fatty foods.  6 hours before the procedure - stop eating light meals or foods, such as toast or cereal.  6 hours before the procedure - stop drinking milk or drinks that contain milk.  2 hours before the procedure - stop drinking clear liquids. Medicines  Ask your health care provider about:  Changing or stopping your regular medicines. This is especially important if you are taking diabetes medicines or blood thinners.  Taking medicines such as aspirin and ibuprofen. These medicines can thin your blood. Do not take these medicines unless your health care provider tells you to take them.  Taking over-the-counter medicines, vitamins, herbs, and supplements. General instructions  Plan to have someone take you home from the hospital or clinic.  If you will be going home right after the procedure, plan to have someone with you for 24 hours.  Ask your health care provider what steps will be taken to help prevent infection. These  may include: ? Removing hair at the procedure site. ? Washing skin with a germ-killing soap. ? Taking antibiotic medicine. What happens during the procedure?  An IV will be inserted into one of your veins.  You will be given one or more of the following: ? A medicine to help you relax (sedative). ? A medicine to numb the area (local anesthetic).  Your health care provider will insert a radiofrequency needle into the area to be treated. This is done with the help of a type of X-ray (fluoroscopy).  A wire that carries the radio waves (electrode) will be put through the radiofrequency needle.  An electrical pulse will be sent through the electrode to verify the correct nerve that is causing your pain. You will feel a tingling sensation, and you may have muscle twitching.  The tissue around the needle tip will be heated by an electric current that comes from the radiofrequency machine. This will numb the nerves.  The needle will be removed.  A bandage (dressing) will be put on the insertion area. The procedure may vary among health care providers and hospitals.   What happens after the procedure?  Your blood pressure, heart rate, breathing rate, and blood oxygen level will be monitored until you leave the hospital or clinic.  Return to your normal activities as told by your health care provider. Ask your health care provider what activities are safe for you.  Do not drive for 24 hours if you were given a sedative during your procedure. Summary  Radiofrequency lesioning is a procedure that is performed to relieve pain. The procedure is often used for back, neck, or arm pain.  Radiofrequency lesioning involves the use of a machine that creates radio waves to make heat.  Plan to have someone take you home from the hospital or clinic. Do not drive for 24 hours if you were given a sedative during your procedure.  Return to your normal activities as told by your health care provider. Ask  your health care provider what activities are safe for you. This information is not intended to replace advice given to you by your health care provider. Make sure you discuss any questions you have with your health care provider. Document Revised: 03/06/2018 Document Reviewed: 03/06/2018 Elsevier Patient Education  [place].

## 2020-10-24 NOTE — Progress Notes (Signed)
Nursing Pain Medication Assessment:  Safety precautions to be maintained throughout the outpatient stay will include: orient to surroundings, keep bed in low position, maintain call bell within reach at all times, provide assistance with transfer out of bed and ambulation.  Medication Inspection Compliance: Pill count conducted under aseptic conditions, in front of the patient. Neither the pills nor the bottle was removed from the patient's sight at any time. Once count was completed pills were immediately returned to the patient in their original bottle.  Medication: Tramadol (Ultram) Pill/Patch Count: 105 of 120 pills remain Pill/Patch Appearance: Markings consistent with prescribed medication Bottle Appearance: Standard pharmacy container. Clearly labeled. Filled Date: 04 / 14 / 2022 Last Medication intake:  Today

## 2020-10-31 LAB — TOXASSURE SELECT 13 (MW), URINE

## 2020-11-10 ENCOUNTER — Other Ambulatory Visit: Payer: Self-pay

## 2020-11-16 ENCOUNTER — Ambulatory Visit: Payer: Medicare Other | Admitting: Family Medicine

## 2020-11-16 ENCOUNTER — Other Ambulatory Visit: Payer: Self-pay

## 2020-11-16 ENCOUNTER — Ambulatory Visit (INDEPENDENT_AMBULATORY_CARE_PROVIDER_SITE_OTHER): Payer: Medicare Other | Admitting: Family Medicine

## 2020-11-16 ENCOUNTER — Encounter: Payer: Self-pay | Admitting: Family Medicine

## 2020-11-16 ENCOUNTER — Telehealth: Payer: Self-pay

## 2020-11-16 ENCOUNTER — Telehealth: Payer: Self-pay | Admitting: Family Medicine

## 2020-11-16 DIAGNOSIS — M899 Disorder of bone, unspecified: Secondary | ICD-10-CM | POA: Diagnosis not present

## 2020-11-16 DIAGNOSIS — M25551 Pain in right hip: Secondary | ICD-10-CM

## 2020-11-16 DIAGNOSIS — M810 Age-related osteoporosis without current pathological fracture: Secondary | ICD-10-CM

## 2020-11-16 DIAGNOSIS — I4891 Unspecified atrial fibrillation: Secondary | ICD-10-CM | POA: Diagnosis not present

## 2020-11-16 DIAGNOSIS — R21 Rash and other nonspecific skin eruption: Secondary | ICD-10-CM | POA: Diagnosis not present

## 2020-11-16 DIAGNOSIS — E039 Hypothyroidism, unspecified: Secondary | ICD-10-CM

## 2020-11-16 LAB — TSH: TSH: 1.08 u[IU]/mL (ref 0.35–4.50)

## 2020-11-16 NOTE — Telephone Encounter (Signed)
lft pt vm regarding if pt would like to go back with [facility].thanks

## 2020-11-16 NOTE — Progress Notes (Signed)
Lvm for the patient to call back.  Ladon Vandenberghe,cma  

## 2020-11-16 NOTE — Assessment & Plan Note (Signed)
Ongoing issue.  We will refer her to orthopedics and physical therapy.

## 2020-11-16 NOTE — Progress Notes (Signed)
[name], MD Phone: [phone]  Amanda Soto is a 87 y.o. female who presents today for f/u.  Atrial fibrillation: Currently on Eliquis and sotalol.  No palpitations.  No bleeding issues.  Hypothyroidism: Taking Synthroid.  No skin changes.  No heat intolerance.  She notes she remains cold all the time.  She does note chronic fatigue.  Osteoporosis: She was unable to tolerate Evenity due to aching.  She also had trouble with the cost.  Right hip pain: This has been an ongoing issue.  She notes her back improved with her back injection though her right hip continues to bother her.  Tramadol is not beneficial.  No recent falls.  She notes her suprapubic area has been sore since her hip started to bother her as well.  Soft tissue mass: She had an ultrasound that revealed normal tissue.  Rash: She notes a rash with red bumps popping up on her arms intermittently.  She saw dermatology and they gave her a topical cream to use which has been beneficial.  Social History   Tobacco Use  Smoking Status Never Smoker  Smokeless Tobacco Never Used    Current Outpatient Medications on File Prior to Visit  Medication Sig Dispense Refill  . acetaminophen (TYLENOL) 325 MG tablet Take 500 mg by mouth every 6 (six) hours as needed for moderate pain, fever or headache.     . Cholecalciferol (VITAMIN D3) 50 MCG (2000 UT) CHEW Chew 1 tablet by mouth daily.    Marland Kitchen ELIQUIS 2.5 MG TABS tablet TAKE 1 TABLET BY MOUTH TWICE A DAY 60 tablet 11  . fluticasone (FLONASE) 50 MCG/ACT nasal spray SPRAY 2 SPRAYS INTO EACH NOSTRIL EVERY DAY    . furosemide (LASIX) 20 MG tablet TAKE 1 TABLET BY MOUTH EVERY DAY 90 tablet 1  . Multiple Vitamins-Minerals (PRESERVISION AREDS 2 PO) Take by mouth in the morning and at bedtime.    Marland Kitchen omeprazole (PRILOSEC) 40 MG capsule TAKE 1 CAPSULE BY MOUTH EVERY DAY 15-20 MINUTES BEFORE MEALS    . sotalol (BETAPACE) 80 MG tablet Take 40 mg by mouth 2 (two) times daily.     Marland Kitchen  SYNTHROID 50 MCG tablet TAKE 1 TABLET BY MOUTH EVERY DAY BEFORE BREAKFAST 90 tablet 1  . [START ON 12/08/2020] traMADol (ULTRAM) 50 MG tablet Take 1 tablet (50 mg total) by mouth every 6 (six) hours as needed for severe pain. Each refill must last 30 days 80 tablet 2  . vitamin B-12 (CYANOCOBALAMIN) 1000 MCG tablet Take by mouth.     No current facility-administered medications on file prior to visit.     ROS see history of present illness  Objective  Physical Exam Vitals:   11/16/20 1047  BP: 120/80  Pulse: 63  Temp: 98.3 F (36.8 C)  SpO2: 99%    BP Readings from Last 3 Encounters:  11/16/20 120/80  10/24/20 114/65  09/07/20 114/71   Wt Readings from Last 3 Encounters:  11/16/20 110 lb 12.8 oz (50.3 kg)  10/24/20 113 lb (51.3 kg)  09/09/20 114 lb (51.7 kg)    Physical Exam Constitutional:      General: She is not in acute distress.    Appearance: She is not diaphoretic.  Cardiovascular:     Rate and Rhythm: Normal rate and regular rhythm.     Heart sounds: Normal heart sounds.  Pulmonary:     Effort: Pulmonary effort is normal.     Breath sounds: Normal breath sounds.  Musculoskeletal:  Right lower leg: No edema.     Left lower leg: No edema.     Comments: Some discomfort in her posterior right hip with internal and external range of motion right hip, there is also some tenderness over the pubic symphysis, no overlying skin changes  Skin:    General: Skin is warm and dry.       Neurological:     Mental Status: She is alert.      Assessment/Plan: Please see individual problem list.  Problem List Items Addressed This Visit    Atrial fibrillation ([place])    Rate controlled.  I suspect her sotalol is contributing to her fatigue as her prior lab evaluation has been unremarkable.  I encouraged her to discuss this medication with her cardiologist at her next visit.      Hypothyroidism    Check TSH.  Continue Synthroid 50 mcg once daily.      Relevant  Orders   TSH   OP (osteoporosis)    The patient had difficulty affording Evenity and was unable to tolerate this.  Discussed with our clinical pharmacist.  We both felt Forteo would be a good option for the patient.  I will have the CMA contact the patient to make sure she is okay with a daily injection.  We will refer her to our clinical pharmacist to help with patient assistance if needed.      Relevant Orders   AMB Referral to [facility]   Rash    Improved with topical cream provided by dermatology.      Right hip pain    Ongoing issue.  We will refer her to orthopedics and physical therapy.      Relevant Orders   Ambulatory referral to Orthopedic Surgery   Ambulatory referral to Home Health   Pubic bone pain    Recent x-ray without any cause for this.  She notes this has been going on for some time now.  We will have her see orthopedics.       Soft tissue lesion in her right groin was found to be normal tissue.  It feels as though it is just fatty tissue today.  Return in about 3 months (around 02/16/2021).  This visit occurred during the SARS-CoV-2 public health emergency.  Safety protocols were in place, including screening questions prior to the visit, additional usage of staff PPE, and extensive cleaning of exam room while observing appropriate contact time as indicated for disinfecting solutions.    [name], MD [place]

## 2020-11-16 NOTE — Progress Notes (Signed)
Patient stated she would like to try the daily injectable for osteoporosis, she just needed to find someone to give her the shot but you can send to pharmacy.  Kenzee Bassin,cma

## 2020-11-16 NOTE — Assessment & Plan Note (Signed)
Check TSH.  Continue Synthroid 50 mcg once daily. 

## 2020-11-16 NOTE — Assessment & Plan Note (Signed)
Rate controlled.  I suspect her sotalol is contributing to her fatigue as her prior lab evaluation has been unremarkable.  I encouraged her to discuss this medication with her cardiologist at her next visit.

## 2020-11-16 NOTE — Patient Instructions (Signed)
Nice to see you. Somebody will contact you to schedule physical therapy and for the orthopedist. We will check your thyroid function today. Will contact you regarding your osteoporosis medication.

## 2020-11-16 NOTE — Assessment & Plan Note (Signed)
Recent x-ray without any cause for this.  She notes this has been going on for some time now.  We will have her see orthopedics.

## 2020-11-16 NOTE — Assessment & Plan Note (Addendum)
The patient had difficulty affording Evenity and was unable to tolerate this.  Discussed with our clinical pharmacist.  We both felt Forteo would be a good option for the patient.  I will have the CMA contact the patient to make sure she is okay with a daily injection.  We will refer her to our clinical pharmacist to help with patient assistance if needed.

## 2020-11-16 NOTE — Assessment & Plan Note (Signed)
Improved with topical cream provided by dermatology.

## 2020-11-17 ENCOUNTER — Telehealth: Payer: Self-pay

## 2020-11-17 NOTE — Chronic Care Management (AMB) (Signed)
  Chronic Care Management   Note  11/17/2020 Name: Amanda Soto MRN: [id number] DOB: [birth year]  Amanda Soto is a 87 y.o. year old female who is a primary care patient of Caryl Bis, Angela Adam, MD. I reached out to Amanda Soto by phone today in response to a referral sent by Ms. Kyilee J Klebba's PCP, [name], MD     [name] was given information about Chronic Care Management services today including:  1. CCM service includes personalized support from designated clinical staff supervised by her physician, including individualized plan of care and coordination with other care providers 2. 24/7 contact phone numbers for assistance for urgent and routine care needs. 3. Service will only be billed when office clinical staff spend 20 minutes or more in a month to coordinate care. 4. Only one practitioner may furnish and bill the service in a calendar month. 5. The patient may stop CCM services at any time (effective at the end of the month) by phone call to the office staff. 6. The patient will be responsible for cost sharing (co-pay) of up to 20% of the service fee (after annual deductible is met).  Patient agreed to services and verbal consent obtained.   Follow up plan: Telephone appointment with care management team member scheduled for:11/24/2020  Noreene Larsson, [address], [place], [place] [address] Direct Dial: [phone] [email or web address] Website: [email or web address]

## 2020-11-21 DIAGNOSIS — R809 Proteinuria, unspecified: Secondary | ICD-10-CM | POA: Diagnosis not present

## 2020-11-21 DIAGNOSIS — I10 Essential (primary) hypertension: Secondary | ICD-10-CM | POA: Diagnosis not present

## 2020-11-24 ENCOUNTER — Telehealth: Payer: Medicare Other

## 2020-11-24 ENCOUNTER — Telehealth: Payer: Self-pay | Admitting: Pharmacist

## 2020-11-24 NOTE — Progress Notes (Signed)
Noted. Can you confirm if she has someone that can give her the injection? Has she been taking vitamin D supplementation?

## 2020-11-24 NOTE — Telephone Encounter (Signed)
  Chronic Care Management   Note  11/24/2020 Name: JESUS POPLIN MRN: [id number] DOB: [birth year]   Attempted to contact patient for scheduled appointment for medication management support. Left HIPAA compliant message for patient to return my call at their convenience.    Plan: - If I do not hear back from the patient by end of business today, will collaborate with Care Guide to outreach to schedule follow up with me   [name], PharmD, [place], [place] Pharmacist [employer] [facility] [employer] [phone]

## 2020-11-29 NOTE — Progress Notes (Signed)
Patient stated she has not found anyone to give her the injections for Osteoporosis. She states she takes a supplement of Vitamin Ds 50 mcg / 12000 units daily.  Amanda Soto,cma

## 2020-11-30 NOTE — Telephone Encounter (Signed)
Patient has been rescheduled.

## 2020-12-01 NOTE — Progress Notes (Signed)
I called and spoke with the patient and she stated her vitamin d is 50 mcg and she stated 12000. Patient stated she could not find anyone to give her the injections but she would like to come here instead of going to endo.  Adonys Wildes,cma

## 2020-12-01 NOTE — Progress Notes (Signed)
Noted. Please confirm that the amount of vitamin D is 1200 mg daily. She will need to let us know when she has found someone to give the injections and I can send them in or if she would prefer we could refer her to endocrinology and they may have a way to do them in the office.

## 2020-12-02 NOTE — Progress Notes (Signed)
Unfortunately these are not injections that we are able to do in our office.

## 2020-12-05 DIAGNOSIS — M533 Sacrococcygeal disorders, not elsewhere classified: Secondary | ICD-10-CM | POA: Diagnosis not present

## 2020-12-07 ENCOUNTER — Telehealth: Payer: Self-pay | Admitting: Family Medicine

## 2020-12-07 NOTE — Telephone Encounter (Signed)
Pt would like a call back to discuss her appt from [facility]

## 2020-12-07 NOTE — Telephone Encounter (Signed)
I called and spoke with the patient and she stated that when she went to [facility] they informed her that they were putting in a referral for her to see a pain specialist, The patient stated she sees a pain specialist Dr. Barnett Applebaum.  I called to [facility] and spoke with the office agent and she read the note and stated the patient was to get a injection in her hip for her pain and that only a pain provider could do it.  I informed them that she has a pain provider and I gave his name, address and phone number of the provider and they are sending this provider the referral so he can give the patient the injection, I then call the patient back and informed her of this and she understood.  Oluwadamilare Tobler,cma

## 2020-12-11 NOTE — Addendum Note (Signed)
Addended by: [name] on: 12/11/2020 06:38 PM   Modules accepted: Orders

## 2020-12-11 NOTE — Progress Notes (Signed)
Referral to endocrinology placed

## 2020-12-14 ENCOUNTER — Ambulatory Visit (INDEPENDENT_AMBULATORY_CARE_PROVIDER_SITE_OTHER): Payer: Medicare Other | Admitting: Pharmacist

## 2020-12-14 DIAGNOSIS — M810 Age-related osteoporosis without current pathological fracture: Secondary | ICD-10-CM | POA: Diagnosis not present

## 2020-12-14 DIAGNOSIS — E039 Hypothyroidism, unspecified: Secondary | ICD-10-CM

## 2020-12-14 DIAGNOSIS — I4891 Unspecified atrial fibrillation: Secondary | ICD-10-CM

## 2020-12-14 DIAGNOSIS — I5022 Chronic systolic (congestive) heart failure: Secondary | ICD-10-CM

## 2020-12-14 DIAGNOSIS — F3341 Major depressive disorder, recurrent, in partial remission: Secondary | ICD-10-CM

## 2020-12-14 MED ORDER — APIXABAN 2.5 MG PO TABS: 2.5000 mg | ORAL_TABLET | Freq: Two times a day (BID) | ORAL | 3 refills | Status: AC

## 2020-12-14 NOTE — Chronic Care Management (AMB) (Signed)
Chronic Care Management Pharmacy Note  12/14/2020 Name:  Amanda Soto MRN:  [id number] DOB:  01/19/1925   Subjective: Amanda Soto is an 87 y.o. year old female who is a primary patient of Sonnenberg, Angela Adam, MD.  The CCM team was consulted for assistance with disease management and care coordination needs.    Engaged with patient by telephone for initial visit in response to provider referral for pharmacy case management and/or care coordination services.   Consent to Services:  The patient was given the following information about Chronic Care Management services today, agreed to services, and gave verbal consent: 1. CCM service includes personalized support from designated clinical staff supervised by the primary care provider, including individualized plan of care and coordination with other care providers 2. 24/7 contact phone numbers for assistance for urgent and routine care needs. 3. Service will only be billed when office clinical staff spend 20 minutes or more in a month to coordinate care. 4. Only one practitioner may furnish and bill the service in a calendar month. 5.The patient may stop CCM services at any time (effective at the end of the month) by phone call to the office staff. 6. The patient will be responsible for cost sharing (co-pay) of up to 20% of the service fee (after annual deductible is met). Patient agreed to services and consent obtained.  Patient Care Team: [name], MD as PCP - General (Family Medicine) Yolonda Kida, MD as Consulting Physician (Cardiology) De Hollingshead, RPH-CPP (Pharmacist)  Recent office visits: 5/18 - PCP f/u - unable to tolerate Evenity due to aching, recommended Forteo  Recent consult visits: 5/23 - nephrology - recommend home BP monitoring, continue current regimen  Hospital visits: None in previous 6 months  Objective:  Lab Results  Component Value Date   CREATININE 0.52 05/11/2020   CREATININE 0.46  (L) 04/26/2020   CREATININE 0.45 04/26/2020    Lab Results  Component Value Date   HGBA1C 5.9 07/12/2017   Last diabetic Eye exam: No results found for: HMDIABEYEEXA  Last diabetic Foot exam: No results found for: HMDIABFOOTEX   No results found for: CHOL, TRIG, HDL, CHOLHDL, VLDL, LDLCALC, LDLDIRECT  Hepatic Function Latest Ref Rng & Units 04/26/2020 04/26/2020 12/18/2019  Total Protein 6.1 - 8.1 g/dL 7.3 7.3 7.6  Albumin 3.5 - 5.2 g/dL - 4.2 4.2  AST 0 - 37 U/L - 19 22  ALT 0 - 35 U/L - 9 15  Alk Phosphatase 39 - 117 U/L - 55 56  Total Bilirubin 0.2 - 1.2 mg/dL - 1.2 1.2  Bilirubin, Direct 0.0 - 0.2 mg/dL - - -    Lab Results  Component Value Date/Time   TSH 1.08 11/16/2020 11:33 AM   TSH 1.81 04/26/2020 01:54 PM    CBC Latest Ref Rng & Units 05/11/2020 05/11/2020 04/26/2020  WBC 4.0 - 10.5 K/uL 7.4 7.4 10.7(H)  Hemoglobin 12.0 - 15.0 g/dL 13.1 - 13.6  Hematocrit 36.0 - 46.0 % 39.8 - 41.4  Platelets 150.0 - 400.0 K/uL 240.0 - 288.0    Lab Results  Component Value Date/Time   VD25OH 28.73 (L) 01/11/2020 10:21 AM   VD25OH 29.45 (L) 10/08/2019 11:16 AM    Clinical ASCVD: No    CHA2DS2-VASc Score = 4  This indicates a 4.8% annual risk of stroke. The patient's score is based upon: CHF History: Yes HTN History: No Diabetes History: No Stroke History: No Vascular Disease History: No Age Score: 2 Gender  Score: 1    Social History   Tobacco Use  Smoking Status Never  Smokeless Tobacco Never   BP Readings from Last 3 Encounters:  11/16/20 120/80  10/24/20 114/65  09/07/20 114/71   Pulse Readings from Last 3 Encounters:  11/16/20 63  10/24/20 (!) 110  09/07/20 96   Wt Readings from Last 3 Encounters:  11/16/20 110 lb 12.8 oz (50.3 kg)  10/24/20 113 lb (51.3 kg)  09/09/20 114 lb (51.7 kg)    Assessment: Review of patient past medical history, allergies, medications, health status, including review of consultants reports, laboratory and other test  data, was performed as part of comprehensive evaluation and provision of chronic care management services.   SDOH:  (Social Determinants of Health) assessments and interventions performed:  SDOH Interventions    Flowsheet Row Most Recent Value  SDOH Interventions   Financial Strain Interventions Other (Comment)  [Medicare Extra Help]       CCM Care Plan  Allergies  Allergen Reactions   Hydrocodone Other (See Comments)    "Funny feeling"   Ciprofloxacin Nausea And Vomiting   Latex Rash    Medications Reviewed Today     Reviewed by De Hollingshead, RPH-CPP (Pharmacist) on 12/14/20 at 1308  Med List Status: <None>   Medication Order Taking? Sig Documenting Provider Last Dose Status Informant  acetaminophen (TYLENOL) 325 MG tablet [id number] No Take 500 mg by mouth every 6 (six) hours as needed for moderate pain, fever or headache.   Patient not taking: Reported on 12/14/2020   [provider] Not Taking Active Self  Cholecalciferol (VITAMIN D3) 50 MCG (2000 UT) CHEW [id number] Yes Chew 1 tablet by mouth daily. [provider] Taking Active   ELIQUIS 2.5 MG TABS tablet [id number] Yes TAKE 1 TABLET BY MOUTH TWICE A DAY [name], MD Taking Active            Med Note Donneta Romberg, DENA L   Thu Jul 14, 2020  2:57 PM)    fluticasone (FLONASE) 50 MCG/ACT nasal spray [id number] Yes SPRAY 2 SPRAYS INTO EACH NOSTRIL EVERY DAY [provider] Taking Active   furosemide (LASIX) 20 MG tablet [id number] Yes TAKE 1 TABLET BY MOUTH EVERY DAY [name], MD Taking Active            Med Note De Hollingshead   Wed Dec 14, 2020  1:07 PM) Needing ~ 2-3 times weekly  Multiple Vitamins-Minerals (PRESERVISION AREDS 2 PO) [id number] Yes Take by mouth in the morning and at bedtime. [provider] Taking Active   omeprazole (PRILOSEC) 40 MG capsule [id number] Yes TAKE 1 CAPSULE BY MOUTH EVERY DAY 15-20 MINUTES BEFORE MEALS [provider] Taking  Active   sotalol (BETAPACE) 80 MG tablet [id number] Yes Take 40 mg by mouth 2 (two) times daily.  [provider] Taking Active   SYNTHROID 50 MCG tablet [id number] Yes TAKE 1 TABLET BY MOUTH EVERY DAY BEFORE BREAKFAST Caryl Bis Angela Adam, MD Taking Active   traMADol (ULTRAM) 50 MG tablet [id number] Yes Take 1 tablet (50 mg total) by mouth every 6 (six) hours as needed for severe pain. Each refill must last 30 days Milinda Pointer, MD Taking Active            Med Note Kelby Aline Dec 14, 2020  1:04 PM) Using 1-2 times daily  vitamin B-12 (CYANOCOBALAMIN) 1000 MCG tablet [id number] Yes Take by mouth. [provider] Taking Active  Med List Note Hart Rochester, RN 10/24/20 1325): UDS 10/24/2020              Patient Active Problem List   Diagnosis Date Noted   Right hip pain 11/16/2020   Pubic bone pain 11/16/2020   Chronic use of opiate for therapeutic purpose 10/24/2020   Postnasal drip 07/28/2020   Vertebral fracture, osteoporotic, sequela 08/81/1031   Uncomplicated opioid dependence ([place]) 06/20/2020   Latex precautions, history of latex allergy 02/04/2020   Heart murmur 01/27/2020   Elevated hemoglobin (HCC) 01/11/2020   Proteinuria 01/11/2020   Night sweats 12/18/2019   Poor vision 09/30/2019   Pincer nail deformity 07/06/2019   Traumatic closed fracture of sacrum (S3), sequela 06/03/2019   Paronychia of second toe, left 05/04/2019   Pathological fracture of sacral vertebra due to secondary osteoporosis ([place]) 03/30/2019   Coccygodynia 03/23/2019   Traumatic fracture of coccyx, sequela 03/23/2019   Rash 01/20/2019   Disorder of skeletal system 01/07/2019   Problems influencing health status 01/07/2019   Neurogenic pain 01/07/2019   Chronic hip pain (Left) 12/10/2018   Chronic elbow pain (Right) 12/10/2018   Allergic rhinitis 06/10/2018   Melena 04/10/2018   Headache 04/10/2018   Spondylosis without myelopathy or radiculopathy,  lumbosacral region 01/09/2018   Other specified dorsopathies, sacral and sacrococcygeal region 01/09/2018   History of allergy to latex 01/09/2018   Actinic keratoses 06/04/2017   Chronic anticoagulation ([place]) 04/24/2017   DDD (degenerative disc disease), lumbar 04/09/2017   Chronic sacroiliac joint pain (Right) 04/04/2017   History of fall 02/11/2017   Chronic fatigue 07/25/2016   Chronic pain syndrome 05/29/2016   Macular degeneration 01/20/2016   Lumbar spondylosis 11/15/2015   Scoliosis of lumbar spine (concave to right side) 11/15/2015   Lumbar facet arthropathy 11/15/2015   Osteoarthritis of hip (Right) 11/15/2015   Thoracic paracentral T7-8 disc protrusion 11/15/2015   Cardiac murmur 11/14/2015   Hyperlipidemia 11/14/2015   MI (mitral incompetence) 11/14/2015   OP (osteoporosis) 11/14/2015   Chronic low back pain (1ry area of Pain) (Right) 11/14/2015   Lumbar facet syndrome (Right) 11/14/2015   Grade 1 Anterolisthesis of L4 over L5 (5 mm) 11/14/2015   Hypothyroidism 11/07/2015   Lightheadedness 11/07/2015   Elevated glucose 09/07/2015   Exertional shortness of breath 08/25/2015   Pharmacologic therapy 05/23/2015   Health care maintenance 59/45/8592   Chronic systolic heart failure ([place]) 11/09/2014   Bradycardia 11/09/2014   Atrial fibrillation ([place]) 11/09/2014   Hypertension 11/09/2014   Depression, major, recurrent, in partial remission ([place]) 01/16/2014   Degeneration of intervertebral disc of lumbar region 11/18/2013   H/O neoplasm 10/21/2012   History of nonmelanoma skin cancer 10/21/2012    Immunization History  Administered Date(s) Administered   Fluad Quad(high Dose 65+) 04/26/2020   Influenza Split 04/22/2014   Influenza, High Dose Seasonal PF 04/25/2016, 05/07/2017, 03/02/2018, 04/10/2019   Influenza-Unspecified 05/02/2015, 04/10/2019   PFIZER(Purple Top)SARS-COV-2 Vaccination 08/06/2019, 08/27/2019, 04/11/2020    Conditions to be  addressed/monitored: Atrial Fibrillation and Osteoporosis  Care Plan : Medication Management  Updates made by De Hollingshead, RPH-CPP since 12/14/2020 12:00 AM     Problem: Osteoporosis, Atrial Fibrillation      Long-Range Goal: Disease Progression   Start Date: 12/14/2020  This Visit's Progress: On track  Priority: High  Note:   Current Barriers:  Suboptimal therapeutic regimen for osteoporosis  Pharmacist Clinical Goal(s):  Over the next 90 days, patient will achieve adherence to monitoring guidelines and medication adherence to achieve therapeutic  efficacy through collaboration with PharmD and provider.    Interventions: 1:1 collaboration with [name], MD regarding development and update of comprehensive plan of care as evidenced by provider attestation and co-signature Inter-disciplinary care team collaboration (see longitudinal plan of care) Comprehensive medication review performed; medication list updated in electronic medical record  SDOH: Lives alone, but no longer drives. Has two local sons. Has a cat.   Health Maintenance: Due for Shingrix. Will discuss moving forward.   Osteoporosis: Current treatment: none Prior treatment: Evenity x 2 doses, but patient reported arthralgias, fatigue persistent after 2 doses. Last dose  Last DEXA: 10/2019, t score at R forearm - 5.3 eGFR ~83 mL/min Supplementation: Vitamin D 2000 units daily but no calcium Discussed consideration for anabolic therapy given severe t-score on last DEXA and noted hx fractures. Did not tolerate Evenity. Forteo should be covered under Part D benefit that would allow her Medicare Extra Help to apply (copays ~ $3.95/30 or 90 day of generic medications or $9.85/30 or 90 day for brand medications). Unfortunately, patient is not willing to give herself a daily injection (although the medication comes in a pre-filled autoinjector). Referral to endocrinology in place to discuss most cost  effective/transportation-need effective and clinically appropriate option. Bisphosphonate or Prolia would also be appropriate options, though wonder if anabolic therapy w/ PTH analog first would be best treatment choice.  Discussed recommendation for patients with osteoporosis to get [phone] units Vitamin D daily and 1200 mg calcium daily from dietary sources or supplements. She does not have regular dietary dairy consumption. Advised to pick up calcium citrate (due to concurrent PPI administration) 400-600 mg daily and take twice daily (due to max calcium absorption of 500 mg at a time). She verbalized understanding.   Atrial Fibrillation: Controlled; current rate/rhythm control: sotolol 40 mg BID; anticoagulant treatment: Eliquis 2.5 mg BID (dose appropriate given age >24 AND weight <60 kg, though Scr <1.5); follows w/ Dr. Clayborn Bigness; furosemide 20 mg daily PRN swelling - though little use recently Discussed that $9.85 copay for Eliquis should be the same if it is a 30 or 90 day supply due to Medicare Extra Help. 90 day supply sent to the pharmacy  Home blood pressure, heart rate readings: not checking.  Recommended to continue current regimen at this time along with cardiology collaboration  Hypothyroidism: Controlled per last lab work; current regimen: levothyroxine 50 mcg daily Confirmed appropriate administration Continue current regimen at this time. Separate MVI and calcium citrate formulation from levothyroxine .  GERD: Controlled per patient report; current regimen: omeprazole 40 mg daily Continue current regimen at this time. Calcium citrate as above  Chronic Pain: Managed per Pain Management; current regimen: tramaol 50 mg up to QID, though patient notes she generally only needs 1-2 doses daily; follows w/ Dr. Consuela Mimes Continue collaboration with [facility] and pain management   Supplements: Vitamin B12, AREDS multivitamin   Patient Goals/Self-Care Activities Over the next 90  days, patient will:  - take medications as prescribed  Follow Up Plan: Telephone follow up appointment with care management team member scheduled for: ~ 3 months      Medication Assistance: None required.  Patient affirms current coverage meets needs.  Patient's preferred pharmacy is:  [id number] [place], [id number]: [phone]: [phone]  Follow Up:  Patient agrees to Care Plan and Follow-up.   Plan: Telephone follow up appointment with care management team member scheduled  for:  12 weeks  [name], PharmD, [place], [place] Clinical Pharmacist [employer] at [place]

## 2020-12-14 NOTE — Patient Instructions (Signed)
Amanda Soto,   It was great talking to you today!  I recommend you pick up a calcium citrate supplement. You can get a strength that is 400-600 mg daily, and I recommend you take 1 tablet twice daily. Make sure you separate this from your levothyroxine by at least 2 hours. Keep taking the Vitamin D 2000 units daily. Both the calcium and the vitamin D are important for your bone health.   Talk to the endocrinologist (they should call you soon to schedule an appointment) about what the best treatment for your osteoporosis is, given your history and transportation concerns. Let them know that you have Medicare Extra Help, so any medications that are covered under Medicare Part D would be either $3.95 (generic) or $9.85 (brand).   Call me with any questions or concerns!  Amanda Soto, PharmD [phone]  Visit Information   PATIENT GOALS:   Goals Addressed               This Visit's Progress     Patient Stated     Medication Adherence (pt-stated)        Patient Goals/Self-Care Activities Over the next 90 days, patient will:  - take medications as prescribed         Consent to CCM Services: Amanda Soto was given information about Chronic Care Management services today including:  CCM service includes personalized support from designated clinical staff supervised by her physician, including individualized plan of care and coordination with other care providers 24/7 contact phone numbers for assistance for urgent and routine care needs. Service will only be billed when office clinical staff spend 20 minutes or more in a month to coordinate care. Only one practitioner may furnish and bill the service in a calendar month. The patient may stop CCM services at any time (effective at the end of the month) by phone call to the office staff. The patient will be responsible for cost sharing (co-pay) of up to 20% of the service fee (after annual deductible is met).  Patient agreed to  services and verbal consent obtained.   The patient verbalized understanding of instructions, educational materials, and care plan provided today and agreed to receive a mailed copy of patient instructions, educational materials, and care plan.   Plan: Telephone follow up appointment with care management team member scheduled for:  2 weeks  Amanda Soto, PharmD, [place], CPP Clinical Pharmacist [place] [facility] Amanda Soto: Patient Care Plan: Medication Management     Problem Identified: Osteoporosis, Atrial Fibrillation      Long-Range Goal: Disease Progression   Start Date: 12/14/2020  This Visit's Progress: On track  Priority: High  Note:   Current Barriers:  Suboptimal therapeutic regimen for osteoporosis  Pharmacist Clinical Goal(s):  Over the next 90 days, patient will achieve adherence to monitoring guidelines and medication adherence to achieve therapeutic efficacy through collaboration with PharmD and provider.    Interventions: 1:1 collaboration with Amanda Haven, MD regarding development and update of comprehensive plan of care as evidenced by provider attestation and co-signature Inter-disciplinary care team collaboration (see longitudinal plan of care) Comprehensive medication review performed; medication list updated in electronic medical record  SDOH: Lives alone, but no longer drives. Has two local sons. Has a cat.   Health Maintenance: Due for Shingrix. Will discuss moving forward.   Osteoporosis: Current treatment: none Prior treatment: Evenity x 2 doses, but patient reported arthralgias, fatigue persistent after 2 doses. Last dose  Last  DEXA: 10/2019, t score at R forearm - 5.3 eGFR ~83 mL/min Supplementation: Vitamin D 2000 units daily but no calcium Discussed consideration for anabolic therapy given severe t-score on last DEXA and noted hx fractures. Did not tolerate Evenity. Forteo should be covered  under Part D benefit that would allow her Medicare Extra Help to apply (copays ~ $3.95/30 or 90 day of generic medications or $9.85/30 or 90 day for brand medications). Unfortunately, patient is not willing to give herself a daily injection (although the medication comes in a pre-filled autoinjector). Referral to endocrinology in place to discuss most cost effective/transportation-need effective and clinically appropriate option. Bisphosphonate or Prolia would also be appropriate options, though wonder if anabolic therapy w/ PTH analog first would be best treatment choice.  Discussed recommendation for patients with osteoporosis to get [phone] units Vitamin D daily and 1200 mg calcium daily from dietary sources or supplements. She does not have regular dietary dairy consumption. Advised to pick up calcium citrate (due to concurrent PPI administration) 400-600 mg daily and take twice daily (due to max calcium absorption of 500 mg at a time). She verbalized understanding.   Atrial Fibrillation: Controlled; current rate/rhythm control: sotolol 40 mg BID; anticoagulant treatment: Eliquis 2.5 mg BID (dose appropriate given age >73 AND weight <60 kg, though Scr <1.5); follows w/ Dr. Clayborn Bigness; furosemide 20 mg daily PRN swelling - though little use recently Discussed that $9.85 copay for Eliquis should be the same if it is a 30 or 90 day supply due to Medicare Extra Help. 90 day supply sent to the pharmacy  Home blood pressure, heart rate readings: not checking.  Recommended to continue current regimen at this time along with cardiology collaboration  Hypothyroidism: Controlled per last lab work; current regimen: levothyroxine 50 mcg daily Confirmed appropriate administration Continue current regimen at this time. Separate MVI and calcium citrate formulation from levothyroxine .  GERD: Controlled per patient report; current regimen: omeprazole 40 mg daily Continue current regimen at this time. Calcium  citrate as above  Chronic Pain: Managed per Pain Management; current regimen: tramaol 50 mg up to QID, though patient notes she generally only needs 1-2 doses daily; follows w/ Dr. Consuela Mimes Continue collaboration with [facility] and pain management   Supplements: Vitamin B12, AREDS multivitamin   Patient Goals/Self-Care Activities Over the next 90 days, patient will:  - take medications as prescribed  Follow Up Plan: Telephone follow up appointment with care management team member scheduled for: ~ 3 months

## 2020-12-20 ENCOUNTER — Telehealth: Payer: Self-pay | Admitting: Family Medicine

## 2020-12-20 NOTE — Telephone Encounter (Signed)
PT called in to talk to Gae Bon in regards to some referrals that were suppose to be put in and would like a callback from [place].

## 2020-12-22 ENCOUNTER — Other Ambulatory Visit: Payer: Self-pay

## 2020-12-22 ENCOUNTER — Encounter: Payer: Self-pay | Admitting: Podiatry

## 2020-12-22 ENCOUNTER — Telehealth: Payer: Self-pay

## 2020-12-22 ENCOUNTER — Ambulatory Visit (INDEPENDENT_AMBULATORY_CARE_PROVIDER_SITE_OTHER): Payer: Medicare Other | Admitting: Podiatry

## 2020-12-22 DIAGNOSIS — L608 Other nail disorders: Secondary | ICD-10-CM

## 2020-12-22 DIAGNOSIS — D689 Coagulation defect, unspecified: Secondary | ICD-10-CM

## 2020-12-22 DIAGNOSIS — M79609 Pain in unspecified limb: Secondary | ICD-10-CM | POA: Diagnosis not present

## 2020-12-22 DIAGNOSIS — B351 Tinea unguium: Secondary | ICD-10-CM

## 2020-12-22 NOTE — Telephone Encounter (Signed)
Pt is wanting a hip injection. She was seen at [facility] and had xrays done. The wanted to give her a shot and she told them she is a pt here and she is wanting Dr.N to give the shot

## 2020-12-22 NOTE — Progress Notes (Signed)
This patient returns to my office for at risk foot care.  This patient requires this care by a professional since this patient will be at risk due to having  Coagulation defect.  Patient is taking eliquiss.  This patient is unable to cut nails herself since the patient cannot reach her  nails.These nails are painful walking and wearing shoes.  This patient presents for at risk foot care today. She presents to the office with her niece.  General Appearance  Alert, conversant and in no acute stress.  Vascular  Dorsalis pedis and posterior tibial  pulses are palpable  bilaterally.  Capillary return is within normal limits  bilaterally. Temperature is within normal limits  bilaterally.  Neurologic  Senn-Weinstein monofilament wire test within normal limits  bilaterally. Muscle power within normal limits bilaterally.  Nails Thick disfigured discolored nails with subungual debris  from hallux to fifth toes bilaterally. No evidence of bacterial infection or drainage bilaterally. Pincer nails.  Orthopedic  No limitations of motion  feet .  No crepitus or effusions noted.  No bony pathology or digital deformities noted.Hammer toes second.  Skin  normotropic skin with no porokeratosis noted bilaterally.  No signs of infections or ulcers noted.     Onychomycosis  Pain in right toes  Pain in left toes  Consent was obtained for treatment procedures.   Mechanical debridement of nails 1-5  bilaterally performed with a nail nipper.  Filed with dremel without incident. No infection or ulcer.     Return office visit 3 months         Told patient to return for periodic foot care and evaluation due to potential at risk complications.   Gardiner Barefoot DPM

## 2020-12-22 NOTE — Telephone Encounter (Signed)
Patient will need to schedule an eval appt with Dr. Dossie Arbour. This has not previously been addressed in his notes, and the ordering provider of the hip x-ray put in her notes that the hip pain may be related to her back.

## 2020-12-23 NOTE — Telephone Encounter (Signed)
I called Pt and added her for a follow up on 7/14 at 1140 it was the only available or she would have to wait until August

## 2020-12-26 ENCOUNTER — Telehealth: Payer: Self-pay | Admitting: Family Medicine

## 2020-12-26 NOTE — Telephone Encounter (Signed)
I received a call from her about this on Friday. No action needed.

## 2020-12-26 NOTE — Telephone Encounter (Signed)
Pt states she received a letter and is unable to read it. Please advise and Thank you!

## 2020-12-26 NOTE — Telephone Encounter (Signed)
I spoke with the referral consultant and her referral is under review and she stated she will call the patient.  Rut Better[place],cma

## 2021-01-03 DIAGNOSIS — I495 Sick sinus syndrome: Secondary | ICD-10-CM | POA: Diagnosis not present

## 2021-01-03 DIAGNOSIS — R011 Cardiac murmur, unspecified: Secondary | ICD-10-CM | POA: Diagnosis not present

## 2021-01-03 DIAGNOSIS — R002 Palpitations: Secondary | ICD-10-CM | POA: Diagnosis not present

## 2021-01-03 DIAGNOSIS — E079 Disorder of thyroid, unspecified: Secondary | ICD-10-CM | POA: Diagnosis not present

## 2021-01-03 DIAGNOSIS — R001 Bradycardia, unspecified: Secondary | ICD-10-CM | POA: Diagnosis not present

## 2021-01-03 DIAGNOSIS — I480 Paroxysmal atrial fibrillation: Secondary | ICD-10-CM | POA: Diagnosis not present

## 2021-01-03 DIAGNOSIS — R5382 Chronic fatigue, unspecified: Secondary | ICD-10-CM | POA: Diagnosis not present

## 2021-01-03 DIAGNOSIS — R42 Dizziness and giddiness: Secondary | ICD-10-CM | POA: Diagnosis not present

## 2021-01-10 DIAGNOSIS — I4891 Unspecified atrial fibrillation: Secondary | ICD-10-CM | POA: Diagnosis not present

## 2021-01-11 NOTE — Progress Notes (Signed)
PROVIDER NOTE: Information contained herein reflects review and annotations entered in association with encounter. Interpretation of such information and data should be left to medically-trained personnel. Information provided to patient can be located elsewhere in the medical record under "Patient Instructions". Document created using STT-dictation technology, any transcriptional errors that may result from process are unintentional.    Patient: Amanda Soto  Service Category: E/M  Provider: Gaspar Cola, MD  DOB: 1925/03/28  DOS: 01/12/2021  Specialty: Interventional Pain Management  MRN: [id number]  Setting: Ambulatory outpatient  PCP: [name], MD  Type: Established Patient    Referring Provider: [name], MD  Location: Office  Delivery: Face-to-face     HPI  Ms. YARIS FERRELL, a 87 y.o. year old female, is here today because of her Chronic right sacroiliac joint pain [M53.3, G89.29]. Ms. Lichter primary complain today is Back Pain (right) Last encounter: My last encounter with her was on 10/24/2020. Pertinent problems: Ms. Alderman has Degeneration of intervertebral disc of lumbar region; H/O neoplasm; Chronic low back pain (1ry area of Pain) (Right); Lumbar facet syndrome (Right); Grade 1 Anterolisthesis of L4 over L5 (5 mm); Lumbar spondylosis; Scoliosis of lumbar spine (concave to right side); Lumbar facet arthropathy; Osteoarthritis of hip (Right); Thoracic paracentral T7-8 disc protrusion; Chronic pain syndrome; Chronic sacroiliac joint pain (Right); DDD (degenerative disc disease), lumbar; Spondylosis without myelopathy or radiculopathy, lumbosacral region; Other specified dorsopathies, sacral and sacrococcygeal region; Headache; Chronic hip pain (Left); Chronic elbow pain (Right); Neurogenic pain; Coccygodynia; Traumatic fracture of coccyx, sequela; 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 Pain Assessment: Severity of Chronic pain is reported as a 8 /10. Location: Back Right/right buttock and back of right leg. Onset: More than a month ago. Quality: Pressure. Timing: Intermittent. Modifying factor(s): sitting, hot shower. Vitals:  height is _0  (1.422 m) and weight is 110 lb (49.9 kg). Her temporal temperature is 96.8 F (36 C) (abnormal). Her blood pressure is 111/56 (abnormal) and her pulse is 115 (abnormal). Her respiration is 14 and oxygen saturation is 97%.   Reason for encounter: worsening of previously known (established) problem the patient was recently seen by Dr. Timoteo Gaul, at [facility], who recommended a right-sided sacroiliac joint injection.  We have previously provided this treatment to the patient on 09/07/2020 with great results.  Unfortunately it would seem that some of the benefit has worn off and therefore we will go ahead and plan on repeating that injection.  On 09/07/2020 she had both, a right-sided sacroiliac joint injection as well as a right-sided lumbar facet block.  Clinically it is challenging to determine if the patient is having problems with 1, the other, or both, due to the fact that the SI joint shares partial innervation with the lower lumbar facet joints.  At this point we plan on doing only the SI joint injection to see if by any chance this is what is bothering her at this point.  If she gets partial relief of the pain then this would confirm our suspicions that she is having the pain come from both of those etiologies.  Today the patient came into the clinic with her son, who appears to be her caregiver.  They had a couple questions for me all of which we have answered.  Because the patient is on Eliquis and did not stop the medication, we  cannot do this injection today.  Physical exam today was compatible with sacroiliitis joint pain with exquisite tenderness to  palpation and exact reproduction of the pain upon exerting pressure over the sacral sulcus this test has been shown to have a 95% sensitivity in terms of its predictive value.  Today we will go ahead and schedule the patient for a right-sided sacroiliac joint block under fluoroscopic guidance, no sedation.  She will stop her Eliquis 3 days prior to the procedure.  RTCB: 09/04/2021  Pharmacotherapy Assessment  Analgesic: Tramadol 50 mg, 1 tablet PO q 6 hrs (200 mg/day of tramadol) MME/day: 20 mg/day.   Monitoring: [place] PMP: PDMP reviewed during this encounter.       Pharmacotherapy: No side-effects or adverse reactions reported. Compliance: No problems identified. Effectiveness: Clinically acceptable.  Landis Martins, RN  01/12/2021 11:55 AM  Sign when Signing Visit Safety precautions to be maintained throughout the outpatient stay will include: orient to surroundings, keep bed in low position, maintain call bell within reach at all times, provide assistance with transfer out of bed and ambulation.     UDS:  Summary  Date Value Ref Range Status  10/24/2020 Note  Final    Comment:    ==================================================================== [employer] Select 13 (MW) ==================================================================== Test                             Result       Flag       Units  Drug Present and Declared for Prescription Verification   Tramadol                       >6667        EXPECTED   ng/mg creat   O-Desmethyltramadol            >6667        EXPECTED   ng/mg creat   N-Desmethyltramadol            >6667        EXPECTED   ng/mg creat    Source of tramadol is a prescription medication. O-desmethyltramadol    and N-desmethyltramadol are expected metabolites of tramadol.  ==================================================================== Test                      Result    Flag   Units      Ref Range   Creatinine              75               mg/dL       >=20 ==================================================================== Declared Medications:  The flagging and interpretation on this report are based on the  following declared medications.  Unexpected results may arise from  inaccuracies in the declared medications.   **Note: The testing scope of this panel includes these medications:   Tramadol   **Note: The testing scope of this panel does not include the  following reported medications:   Acetaminophen  Apixaban  Cyanocobalamin  Fluticasone  Furosemide  Levothyroxine  Multivitamin  Omeprazole  Sotalol  Vitamin D3 ==================================================================== For clinical consultation, please call [phone]. ====================================================================      ROS  Constitutional: Denies any fever or chills Gastrointestinal: No reported hemesis, hematochezia, vomiting, or acute GI distress Musculoskeletal: Denies any acute onset joint swelling, redness, loss of ROM, or weakness Neurological: No reported episodes of acute onset apraxia, aphasia,  dysarthria, agnosia, amnesia, paralysis, loss of coordination, or loss of consciousness  Medication Review  Multiple Vitamins-Minerals, Vitamin D3, acetaminophen, apixaban, fluticasone, furosemide, levothyroxine, omeprazole, sotalol, traMADol, and vitamin B-12  History Review  Allergy: Ms. Spina is allergic to hydrocodone, ciprofloxacin, and latex. Drug: Ms. Seawright  reports no history of drug use. Alcohol:  reports current alcohol use of about 1.0 standard drink of alcohol per week. Tobacco:  reports that she has never smoked. She has never used smokeless tobacco. Social: Ms. Aicher  reports that she has never smoked. She has never used smokeless tobacco. She reports current alcohol use of about 1.0 standard drink of alcohol per week. She reports that she does not use drugs. Medical:  has a past medical history of Acute  postoperative pain (12/17/2016), Anemia (11/10/2015), Arthritis, Atrial fibrillation ([place]), CHF (congestive heart failure) ([place]), Chickenpox, Chronic abdominal pain (01/16/2014), Closed Colles' fracture (12/17/2016), Degenerative arthritis of hip (08/19/2014), Degenerative arthritis of lumbar spine (11/18/2013), Depression, Diverticulitis, GERD (gastroesophageal reflux disease), Heart murmur, Heart rate slow, Hyperlipidemia, Hypertension, Hypothyroid, Neuritis or radiculitis due to rupture of lumbar intervertebral disc (11/18/2013), Skin cancer (2016 ?), Symptomatic anemia (11/09/2015), and Trochanteric bursitis of right hip (11/18/2013). Surgical: Ms. Krisher  has a past surgical history that includes Cataract extraction; Partial hysterectomy; Appendectomy; and Tonsillectomy. Family: family history includes Breast cancer in her mother and sister; Heart disease in an other family member; Hypertension in an other family member; Multiple sclerosis in her sister; Stroke in her maternal grandmother and sister.  Laboratory Chemistry Profile   Renal Lab Results  Component Value Date   BUN 14 05/11/2020   CREATININE 0.52 05/11/2020   LABCREA 90 01/11/2020   BCR 24 (H) 04/26/2020   GFR 80.19 05/11/2020   GFRAA >60 12/18/2019   GFRNONAA >60 12/18/2019    Hepatic Lab Results  Component Value Date   AST 19 04/26/2020   ALT 9 04/26/2020   ALBUMIN 4.2 04/26/2020   ALKPHOS 55 04/26/2020   LIPASE 181 11/02/2013    Electrolytes Lab Results  Component Value Date   NA 134 (L) 05/11/2020   K 4.0 05/11/2020   CL 97 05/11/2020   CALCIUM 9.3 05/11/2020   MG 2.0 03/30/2019   PHOS 3.1 04/26/2020    Bone Lab Results  Component Value Date   VD25OH 28.73 (L) 01/11/2020   25OHVITD1 14 (L) 03/30/2019   25OHVITD2 <1.0 03/30/2019   25OHVITD3 14 03/30/2019    Inflammation (CRP: Acute Phase) (ESR: Chronic Phase) Lab Results  Component Value Date   CRP <1 03/30/2019   ESRSEDRATE 50 (H) 03/30/2019         Note:  Above Lab results reviewed.  Recent Imaging Review  DG PAIN CLINIC C-ARM 1-60 MIN NO REPORT Fluoro was used, but no Radiologist interpretation will be provided.  Please refer to "NOTES" tab for provider progress note. Note: Reviewed        Physical Exam  General appearance: Well nourished, well developed, and well hydrated. In no apparent acute distress Mental status: Alert, oriented x 3 (person, place, & time)       Respiratory: No evidence of acute respiratory distress Eyes: PERLA Vitals: BP (!) 111/56   Pulse (!) 115   Temp (!) 96.8 F (36 C) (Temporal)   Resp 14   Ht _0  (1.422 m)   Wt 110 lb (49.9 kg)   SpO2 97%   BMI 24.66 kg/m  BMI: Estimated body mass index is 24.66 kg/m as calculated from the following:  Height as of this encounter: _0  (1.422 m).   Weight as of this encounter: 110 lb (49.9 kg). Ideal: Patient must be at least 60 in tall to calculate ideal body weight  Assessment   Status Diagnosis  Controlled Controlled Controlled 1. Chronic sacroiliac joint pain (Right)   2. Other specified dorsopathies, sacral and sacrococcygeal region   3. Chronic pain syndrome   4. Pharmacologic therapy   5. Chronic use of opiate for therapeutic purpose   6. Chronic anticoagulation (Eliquis)   7. Encounter for chronic pain management      Updated Problems: No problems updated.  Plan of Care  Problem-specific:  No problem-specific Assessment & Plan notes found for this encounter.  Ms. ANALYCIA KHOKHAR has a current medication list which includes the following long-term medication(s): apixaban, fluticasone, furosemide, sotalol, synthroid, and [START ON 03/08/2021] tramadol.  Pharmacotherapy (Medications Ordered): Meds ordered this encounter  Medications   traMADol (ULTRAM) 50 MG tablet    Sig: Take 1 tablet (50 mg total) by mouth every 6 (six) hours as needed for severe pain. Each refill must last 30 days    Dispense:  80 tablet    Refill:  2    Not a duplicate.  Do NOT delete! Dispense 1 day early if closed on refill date. Avoid benzodiazepines within 8 hours of opioids. Do not send refill requests.    Orders:  Orders Placed This Encounter  Procedures   SACROILIAC JOINT INJECTION    Standing Status:   Future    Standing Expiration Date:   02/12/2021    Scheduling Instructions:     Side: Right-sided     Sedation: No Sedation.     Timeframe: ASAA    Order Specific Question:   Where will this procedure be performed?    Answer:   [facility]   Blood Thinner Instructions to Nursing    Always make sure patient has clearance from prescribing physician to stop blood thinners for interventional therapies. If the patient requires a Lovenox-bridge therapy, make sure arrangements are made to institute it with the assistance of the PCP.    Scheduling Instructions:     Have Ms. Insley stop the Eliquis (Apixaban) x 3 days prior to procedure or surgery.    Follow-up plan:   Return for Procedure (no sedation): (R) SI, (Blood Thinner Protocol).     Interventional Therapies  Risk  Complexity Considerations:   NOTE: Eliquis Anticoagulation (Stop:3 days  Restart: 6 hrs)  Advanced age  Latex allergy   Planned  Pending:   Palliative right lumbar facet RFA #3    Under consideration:   Palliative interventions    Completed:   Palliative/therapeutic caudal ESI x1 (07/21/2019) (100/100/90/90)  Palliative right lumbar facet (L2, L3, L4, L5, & S1) MBB x9 (09/07/2020) (100/100/90/75)  Palliative/Therapeutic right lumbar facet RFA x2 (03/27/2018) (100/100/100/90)  Palliative right sacroiliac joint block x4 (09/07/2020)  Palliative/therapeutic right-sided SI joint RFA x1 (03/27/2018) (100/90/0)    Therapeutic  Palliative (PRN) options:   Palliative/therapeutic caudal ESI #2  Palliative right lumbar facet block #10  Palliative/Therapeutic right lumbar facet RFA #3  Palliative right sacroiliac joint block #5  Palliative/therapeutic right-sided SI joint RFA  #2      Recent Visits Date Type Provider Dept  10/24/20 Office Visit Milinda Pointer, MD [facility]  Showing recent visits within past 90 days and meeting all other requirements Today's Visits Date Type Provider Dept  01/12/21 Office Visit Milinda Pointer, MD [facility]  Showing today's visits and meeting all other requirements Future Appointments Date Type Provider Dept  01/24/21 Appointment Milinda Pointer, MD [facility]  03/08/21 Appointment Milinda Pointer, MD [facility]  Showing future appointments within next 90 days and meeting all other requirements I discussed the assessment and treatment plan with the patient. The patient was provided an opportunity to ask questions and all were answered. The patient agreed with the plan and demonstrated an understanding of the instructions.  Patient advised to call back or seek an in-person evaluation if the symptoms or condition worsens.  Duration of encounter: 35 minutes.  Note by: Gaspar Cola, MD Date: 01/12/2021; Time: 7:27 PM

## 2021-01-12 ENCOUNTER — Ambulatory Visit: Payer: Medicare Other | Attending: Pain Medicine | Admitting: Pain Medicine

## 2021-01-12 ENCOUNTER — Encounter: Payer: Self-pay | Admitting: Pain Medicine

## 2021-01-12 ENCOUNTER — Other Ambulatory Visit: Payer: Self-pay

## 2021-01-12 VITALS — BP 111/56 | HR 115 | Temp 96.8°F | Resp 14 | Ht <= 58 in | Wt 110.0 lb

## 2021-01-12 DIAGNOSIS — Z79891 Long term (current) use of opiate analgesic: Secondary | ICD-10-CM | POA: Diagnosis not present

## 2021-01-12 DIAGNOSIS — M533 Sacrococcygeal disorders, not elsewhere classified: Secondary | ICD-10-CM | POA: Diagnosis not present

## 2021-01-12 DIAGNOSIS — M5388 Other specified dorsopathies, sacral and sacrococcygeal region: Secondary | ICD-10-CM | POA: Diagnosis not present

## 2021-01-12 DIAGNOSIS — Z7901 Long term (current) use of anticoagulants: Secondary | ICD-10-CM

## 2021-01-12 DIAGNOSIS — G894 Chronic pain syndrome: Secondary | ICD-10-CM | POA: Diagnosis not present

## 2021-01-12 DIAGNOSIS — G8929 Other chronic pain: Secondary | ICD-10-CM | POA: Diagnosis not present

## 2021-01-12 DIAGNOSIS — Z79899 Other long term (current) drug therapy: Secondary | ICD-10-CM | POA: Diagnosis not present

## 2021-01-12 MED ORDER — TRAMADOL HCL 50 MG PO TABS
50.0000 mg | ORAL_TABLET | Freq: Four times a day (QID) | ORAL | 2 refills | Status: DC | PRN
Start: 2021-03-08 — End: 2021-02-07

## 2021-01-12 NOTE — Patient Instructions (Addendum)
__Stop  Eliquis for 3 days prior to procedure __________________________________________________________________________________________  Preparing for your procedure (without sedation)  Procedure appointments are limited to planned procedures: No Prescription Refills. No disability issues will be discussed. No medication changes will be discussed.  Instructions: Oral Intake: Do not eat or drink anything for at least 6 hours prior to your procedure. (Exception: Blood Pressure Medication. See below.) Transportation: Unless otherwise stated by your physician, you may drive yourself after the procedure. Blood Pressure Medicine: Do not forget to take your blood pressure medicine with a sip of water the morning of the procedure. If your Diastolic (lower reading)is above 100 mmHg, elective cases will be cancelled/rescheduled. Blood thinners: These will need to be stopped for procedures. Notify our staff if you are taking any blood thinners. Depending on which one you take, there will be specific instructions on how and when to stop it. Diabetics on insulin: Notify the staff so that you can be scheduled 1st case in the morning. If your diabetes requires high dose insulin, take only  of your normal insulin dose the morning of the procedure and notify the staff that you have done so. Preventing infections: Shower with an antibacterial soap the morning of your procedure.  Build-up your immune system: Take 1000 mg of Vitamin C with every meal (3 times a day) the day prior to your procedure. Antibiotics: Inform the staff if you have a condition or reason that requires you to take antibiotics before dental procedures. Pregnancy: If you are pregnant, call and cancel the procedure. Sickness: If you have a cold, fever, or any active infections, call and cancel the procedure. Arrival: You must be in the facility at least 30 minutes prior to your scheduled procedure. Children: Do not bring any children with  you. Dress appropriately: Bring dark clothing that you would not mind if they get stained. Valuables: Do not bring any jewelry or valuables.  Reasons to call and reschedule or cancel your procedure: (Following these recommendations will minimize the risk of a serious complication.) Surgeries: Avoid having procedures within 2 weeks of any surgery. (Avoid for 2 weeks before or after any surgery). Flu Shots: Avoid having procedures within 2 weeks of a flu shots or . (Avoid for 2 weeks before or after immunizations). Barium: Avoid having a procedure within 7-10 days after having had a radiological study involving the use of radiological contrast. (Myelograms, Barium swallow or enema study). Heart attacks: Avoid any elective procedures or surgeries for the initial 6 months after a "Myocardial Infarction" (Heart Attack). Blood thinners: It is imperative that you stop these medications before procedures. Let us know if you if you take any blood thinner.  Infection: Avoid procedures during or within two weeks of an infection (including chest colds or gastrointestinal problems). Symptoms associated with infections include: Localized redness, fever, chills, night sweats or profuse sweating, burning sensation when voiding, cough, congestion, stuffiness, runny nose, sore throat, diarrhea, nausea, vomiting, cold or Flu symptoms, recent or current infections. It is specially important if the infection is over the area that we intend to treat. Heart and lung problems: Symptoms that may suggest an active cardiopulmonary problem include: cough, chest pain, breathing difficulties or shortness of breath, dizziness, ankle swelling, uncontrolled high or unusually low blood pressure, and/or palpitations. If you are experiencing any of these symptoms, cancel your procedure and contact your primary care physician for an evaluation.  Remember:  Regular Business hours are:  Monday to Thursday 8:00 AM to 4:00 PM  Provider's  Schedule: Milinda Pointer, MD:  Procedure days: Tuesday and Thursday 7:30 AM to 4:00 PM  Gillis Santa, MD:  Procedure days: Monday and Wednesday 7:30 AM to 4:00 PM ____________________________________________________________________________________________  ____________________________________________________________________________________________  Blood Thinners  IMPORTANT NOTICE:  If you take any of these, make sure to notify the nursing staff.  Failure to do so may result in injury.  Recommended time intervals to stop and restart blood-thinners, before & after invasive procedures  Generic Name Brand Name Pre-procedure. Stop this long before procedure. Post-procedure. Minimum waiting period before restarting.  Abciximab Reopro 15 days 2 hrs  Alteplase Activase 10 days 10 days  Anagrelide Agrylin    Apixaban Eliquis 3 days 6 hrs  Cilostazol Pletal 3 days 5 hrs  Clopidogrel Plavix 7-10 days 2 hrs  Dabigatran Pradaxa 5 days 6 hrs  Dalteparin Fragmin 24 hours 4 hrs  Dipyridamole Aggrenox 11days 2 hrs  Edoxaban Lixiana; Savaysa 3 days 2 hrs  Enoxaparin  Lovenox 24 hours 4 hrs  Eptifibatide Integrillin 8 hours 2 hrs  Fondaparinux  Arixtra 72 hours 12 hrs  Hydroxychloroquine Plaquenil 11 days   Prasugrel Effient 7-10 days 6 hrs  Reteplase Retavase 10 days 10 days  Rivaroxaban Xarelto 3 days 6 hrs  Ticagrelor Brilinta 5-7 days 6 hrs  Ticlopidine Ticlid 10-14 days 2 hrs  Tinzaparin Innohep 24 hours 4 hrs  Tirofiban Aggrastat 8 hours 2 hrs  Warfarin Coumadin 5 days 2 hrs   Other medications with blood-thinning effects  Product indications Generic (Brand) names Note  Cholesterol Lipitor Stop 4 days before procedure  Blood thinner (injectable) Heparin (LMW or LMWH Heparin) Stop 24 hours before procedure  Cancer Ibrutinib (Imbruvica) Stop 7 days before procedure  Malaria/Rheumatoid Hydroxychloroquine (Plaquenil) Stop 11 days before procedure  Thrombolytics  10 days before or  after procedures   Over-the-counter (OTC) Products with blood-thinning effects  Product Common names Stop Time  Aspirin > 325 mg Goody Powders, Excedrin, etc. 11 days  Aspirin ? 81 mg  7 days  Fish oil  4 days  Garlic supplements  7 days  Ginkgo biloba  36 hours  Ginseng  24 hours  NSAIDs Ibuprofen, Naprosyn, etc. 3 days  Vitamin E  4 days   ____________________________________________________________________________________________

## 2021-01-12 NOTE — Progress Notes (Signed)
Safety precautions to be maintained throughout the outpatient stay will include: orient to surroundings, keep bed in low position, maintain call bell within reach at all times, provide assistance with transfer out of bed and ambulation.  

## 2021-01-16 ENCOUNTER — Telehealth: Payer: Self-pay | Admitting: Family Medicine

## 2021-01-16 NOTE — Telephone Encounter (Signed)
I called and spoke with the patient and she is having diarrhea occasionally after she eats not after everytime she eats, After speaking with the provider I informed her that he stated she needed to keep a log of what she is eating daily and when and if she had a diarrhea episode to document it and give me a call back Friday and let me know what the conclusion was and she agreed.  Toris Laverdiere,cma

## 2021-01-16 NOTE — Telephone Encounter (Signed)
I spoke with pt she would like a call regarding having diarrhea x2 days. Please advise and Thank you!  Call pt @ [phone].

## 2021-01-23 NOTE — Progress Notes (Signed)
PROVIDER NOTE: Information contained herein reflects review and annotations entered in association with encounter. Interpretation of such information and data should be left to medically-trained personnel. Information provided to patient can be located elsewhere in the medical record under "Patient Instructions". Document created using STT-dictation technology, any transcriptional errors that may result from process are unintentional.    Patient: Amanda Soto  Service Category: Procedure  Provider: Gaspar Cola, MD  DOB: May 13, 1925  DOS: 01/24/2021  Location: [facility]  MRN: [id number]  Setting: Ambulatory - outpatient  Referring Provider: [name], MD  Type: Established Patient  Specialty: Interventional Pain Management  PCP: [name], MD   Primary Reason for Visit: Interventional Pain Management Treatment. CC: Back Pain (Low right)  Procedure:          Anesthesia, Analgesia, Anxiolysis:  Type: Diagnostic Sacroiliac Joint Steroid Injection          Region: Superior Lumbosacral Region Level: PSIS (Posterior Superior Iliac Spine) Laterality: Right  Type: Local Anesthesia Indication(s): Analgesia         Route: Infiltration (Rison/IM) IV Access: Declined Sedation: Declined  Local Anesthetic: Lidocaine 1-2%  Position: Prone           Indications: 1. Chronic low back pain (1ry area of Pain) (Right)   2. Chronic sacroiliac joint pain (Right)   3. Other specified dorsopathies, sacral and sacrococcygeal region   4. Traumatic closed fracture of sacrum (S3), sequela   5. Chronic anticoagulation (Eliquis)    Pain Score: Pre-procedure: 9 /10 Post-procedure: 0-No pain/10   Pre-op H&P Assessment:  Amanda Soto is a 87 y.o. (year old), female patient, seen today for interventional treatment. She  has a past surgical history that includes Cataract extraction; Partial hysterectomy; Appendectomy; and Tonsillectomy. Amanda Soto has a current medication list which  includes the following prescription(s): acetaminophen, apixaban, vitamin d3, fluticasone, furosemide, multiple vitamins-minerals, omeprazole, sotalol, synthroid, [START ON 03/08/2021] tramadol, and vitamin b-12. Her primarily concern today is the Back Pain (Low right)  Initial Vital Signs:  Pulse/HCG Rate: (!) 109  Temp: (!) 97.2 F (36.2 C) Resp: 16 BP: 117/83 SpO2: 98 %  BMI: Estimated body mass index is 23.8 kg/m as calculated from the following:   Height as of this encounter: '4\' 9"'$  (1.448 m).   Weight as of this encounter: 110 lb (49.9 kg).  Risk Assessment: Allergies: Reviewed. She is allergic to hydrocodone, ciprofloxacin, and latex.  Allergy Precautions: None required Coagulopathies: Reviewed. None identified.  Blood-thinner therapy: None at this time Active Infection(s): Reviewed. None identified. Amanda Soto is afebrile  Site Confirmation: Amanda Soto was asked to confirm the procedure and laterality before marking the site Procedure checklist: Completed Consent: Before the procedure and under the influence of no sedative(s), amnesic(s), or anxiolytics, the patient was informed of the treatment options, risks and possible complications. To fulfill our ethical and legal obligations, as recommended by the American Medical Association's Code of Ethics, I have informed the patient of my clinical impression; the nature and purpose of the treatment or procedure; the risks, benefits, and possible complications of the intervention; the alternatives, including doing nothing; the risk(s) and benefit(s) of the alternative treatment(s) or procedure(s); and the risk(s) and benefit(s) of doing nothing. The patient was provided information about the general risks and possible complications associated with the procedure. These may include, but are not limited to: failure to achieve desired goals, infection, bleeding, organ or nerve damage, allergic reactions, paralysis, and death. In addition, the  patient was informed of those risks and complications associated to the procedure, such as failure to decrease pain; infection; bleeding; organ or nerve damage with subsequent damage to sensory, motor, and/or autonomic systems, resulting in permanent pain, numbness, and/or weakness of one or several areas of the body; allergic reactions; (i.e.: anaphylactic reaction); and/or death. Furthermore, the patient was informed of those risks and complications associated with the medications. These include, but are not limited to: allergic reactions (i.e.: anaphylactic or anaphylactoid reaction(s)); adrenal axis suppression; blood sugar elevation that in diabetics may result in ketoacidosis or comma; water retention that in patients with history of congestive heart failure may result in shortness of breath, pulmonary edema, and decompensation with resultant heart failure; weight gain; swelling or edema; medication-induced neural toxicity; particulate matter embolism and blood vessel occlusion with resultant organ, and/or nervous system infarction; and/or aseptic necrosis of one or more joints. Finally, the patient was informed that Medicine is not an exact science; therefore, there is also the possibility of unforeseen or unpredictable risks and/or possible complications that may result in a catastrophic outcome. The patient indicated having understood very clearly. We have given the patient no guarantees and we have made no promises. Enough time was given to the patient to ask questions, all of which were answered to the patient's satisfaction. [name] has indicated that she wanted to continue with the procedure. Attestation: I, the ordering provider, attest that I have discussed with the patient the benefits, risks, side-effects, alternatives, likelihood of achieving goals, and potential problems during recovery for the procedure that I have provided informed consent. Date  Time: 01/24/2021 11:31 AM  Pre-Procedure  Preparation:  Monitoring: As per clinic protocol. Respiration, ETCO2, SpO2, BP, heart rate and rhythm monitor placed and checked for adequate function Safety Precautions: Patient was assessed for positional comfort and pressure points before starting the procedure. Time-out: I initiated and conducted the "Time-out" before starting the procedure, as per protocol. The patient was asked to participate by confirming the accuracy of the "Time Out" information. Verification of the correct person, site, and procedure were performed and confirmed by me, the nursing staff, and the patient. "Time-out" conducted as per Joint Commission's Universal Protocol (UP.01.01.01). Time: 1230  Description of Procedure:          Target Area: Superior, posterior, aspect of the sacroiliac fissure Approach: Posterior, paraspinal, ipsilateral approach. Area Prepped: Entire Lower Lumbosacral Region DuraPrep (Iodine Povacrylex [0.7% available iodine] and Isopropyl Alcohol, 74% w/w) Safety Precautions: Aspiration looking for blood return was conducted prior to all injections. At no point did we inject any substances, as a needle was being advanced. No attempts were made at seeking any paresthesias. Safe injection practices and needle disposal techniques used. Medications properly checked for expiration dates. SDV (single dose vial) medications used. Description of the Procedure: Protocol guidelines were followed. The patient was placed in position over the procedure table. The target area was identified and the area prepped in the usual manner. Skin & deeper tissues infiltrated with local anesthetic. Appropriate amount of time allowed to pass for local anesthetics to take effect. The procedure needle was advanced under fluoroscopic guidance into the sacroiliac joint until a firm endpoint was obtained. Proper needle placement secured. Negative aspiration confirmed. Solution injected in intermittent fashion, asking for systemic  symptoms every 0.5cc of injectate. The needles were then removed and the area cleansed, making sure to leave some of the prepping solution back to take advantage of its long term bactericidal properties. Vitals:  01/24/21 1130 01/24/21 1230 01/24/21 1237  BP: 117/83 129/85 131/82  Pulse: (!) 109 (!) 104 95  Resp: '16 20 20  '$ Temp: (!) 97.2 F (36.2 C)    SpO2: 98% 96% 98%  Weight: 110 lb (49.9 kg)    Height: '4\' 9"'$  (1.448 m)      Start Time: 1230 hrs. End Time: 1233 hrs. Materials:  Needle(s) Type: Spinal Needle Gauge: 22G Length: 5.0-in Medication(s): Please see orders for medications and dosing details.  Imaging Guidance (Non-Spinal):          Type of Imaging Technique: Fluoroscopy Guidance (Non-Spinal) Indication(s): Assistance in needle guidance and placement for procedures requiring needle placement in or near specific anatomical locations not easily accessible without such assistance. Exposure Time: Please see nurses notes. Contrast: Before injecting any contrast, we confirmed that the patient did not have an allergy to iodine, shellfish, or radiological contrast. Once satisfactory needle placement was completed at the desired level, radiological contrast was injected. Contrast injected under live fluoroscopy. No contrast complications. See chart for type and volume of contrast used. Fluoroscopic Guidance: I was personally present during the use of fluoroscopy. "Tunnel Vision Technique" used to obtain the best possible view of the target area. Parallax error corrected before commencing the procedure. "Direction-depth-direction" technique used to introduce the needle under continuous pulsed fluoroscopy. Once target was reached, antero-posterior, oblique, and lateral fluoroscopic projection used confirm needle placement in all planes. Images permanently stored in EMR. Interpretation: I personally interpreted the imaging intraoperatively. Adequate needle placement confirmed in multiple  planes. Appropriate spread of contrast into desired area was observed. No evidence of afferent or efferent intravascular uptake. Permanent images saved into the patient's record.  Antibiotic Prophylaxis:   Anti-infectives (From admission, onward)    None      Indication(s): None identified  Post-operative Assessment:  Post-procedure Vital Signs:  Pulse/HCG Rate: 95 (irreg)  Temp: (!) 97.2 F (36.2 C) Resp: 20 BP: 131/82 SpO2: 98 %  EBL: None  Complications: No immediate post-treatment complications observed by team, or reported by patient.  Note: The patient tolerated the entire procedure well. A repeat set of vitals were taken after the procedure and the patient was kept under observation following institutional policy, for this type of procedure. Post-procedural neurological assessment was performed, showing return to baseline, prior to discharge. The patient was provided with post-procedure discharge instructions, including a section on how to identify potential problems. Should any problems arise concerning this procedure, the patient was given instructions to immediately contact us, at any time, without hesitation. In any case, we plan to contact the patient by telephone for a follow-up status report regarding this interventional procedure.  Comments:  No additional relevant information.  Plan of Care  Orders:  Orders Placed This Encounter  Procedures   SACROILIAC JOINT INJECTION    Scheduling Instructions:     Side: Right-sided     Sedation: No Sedation.     Timeframe: Today    Order Specific Question:   Where will this procedure be performed?    Answer:   [facility]   DG [facility] C-ARM 1-60 MIN NO REPORT    Intraoperative interpretation by procedural physician at [facility].    Standing Status:   Standing    Number of Occurrences:   1    Order Specific Question:   Reason for exam:    Answer:   Assistance in needle guidance and placement for  procedures requiring needle placement in or near specific  anatomical locations not easily accessible without such assistance.   Informed Consent Details: Physician/Practitioner Attestation; Transcribe to consent form and obtain patient signature    Nursing Order: Transcribe to consent form and obtain patient signature. Note: Always confirm laterality of pain with Ms. Waligorski, before procedure.    Order Specific Question:   Physician/Practitioner attestation of informed consent for procedure/surgical case    Answer:   I, the physician/practitioner, attest that I have discussed with the patient the benefits, risks, side effects, alternatives, likelihood of achieving goals and potential problems during recovery for the procedure that I have provided informed consent.    Order Specific Question:   Procedure    Answer:   Sacroiliac Joint Block    Order Specific Question:   Physician/Practitioner performing the procedure    Answer:   Damaris Abeln A. Dossie Arbour, MD    Order Specific Question:   Indication/Reason    Answer:   Chronic Low Back and Hip Pain secondary to Sacroiliac Joint Pain (Arthralgia/Arthropathy)   Care order/instruction: Please confirm that the patient has stopped the Eliquis (Apixaban) x 3 days prior to procedure or surgery.    Please confirm that the patient has stopped the Eliquis (Apixaban) x 3 days prior to procedure or surgery.    Standing Status:   Standing    Number of Occurrences:   1   Provide equipment / supplies at bedside    "Block Tray" (Disposable  single use) Needle type: SpinalSpinal Amount/quantity: 1 Size: Regular (3.5-inch) Gauge: 22G    Standing Status:   Standing    Number of Occurrences:   1    Order Specific Question:   Specify    Answer:   Block Tray   Bleeding precautions    Standing Status:   Standing    Number of Occurrences:   1   Chronic Opioid Analgesic:  Tramadol 50 mg, 1 tablet PO q 6 hrs (200 mg/day of tramadol) MME/day: 20 mg/day.   Medications  ordered for procedure: Meds ordered this encounter  Medications   lidocaine (XYLOCAINE) 2 % (with pres) injection 400 mg   methylPREDNISolone acetate (DEPO-MEDROL) injection 80 mg   ropivacaine (PF) 2 mg/mL (0.2%) (NAROPIN) injection 9 mL   Medications administered: We administered lidocaine, methylPREDNISolone acetate, and ropivacaine (PF) 2 mg/mL (0.2%).  See the medical record for exact dosing, route, and time of administration.  Follow-up plan:   Return in about 2 weeks (around 02/07/2021) for procedure day (afternoon VV) (PPE).      Interventional Therapies  Risk  Complexity Considerations:   NOTE: Eliquis Anticoagulation (Stop:3 days  Restart: 6 hrs)  Advanced age  Latex allergy   Planned  Pending:   Palliative right lumbar facet RFA #3    Under consideration:   Palliative interventions    Completed:   Palliative/therapeutic caudal ESI x1 (07/21/2019) (100/100/90/90)  Palliative right lumbar facet (L2, L3, L4, L5, & S1) MBB x9 (09/07/2020) (100/100/90/75)  Palliative/Therapeutic right lumbar facet RFA x2 (03/27/2018) (100/100/100/90)  Palliative right sacroiliac joint block x4 (09/07/2020)  Palliative/therapeutic right-sided SI joint RFA x1 (03/27/2018) (100/90/0)    Therapeutic  Palliative (PRN) options:   Palliative/therapeutic caudal ESI #2  Palliative right lumbar facet block #10  Palliative/Therapeutic right lumbar facet RFA #3  Palliative right sacroiliac joint block #5  Palliative/therapeutic right-sided SI joint RFA #2       Recent Visits Date Type Provider Dept  01/12/21 Office Visit Milinda Pointer, MD [facility]  Showing recent visits within past 90 days  and meeting all other requirements Today's Visits Date Type Provider Dept  01/24/21 Procedure visit Milinda Pointer, MD [facility]  Showing today's visits and meeting all other requirements Future Appointments Date Type Provider Dept  02/07/21 Appointment Milinda Pointer, MD  [facility]  03/08/21 Appointment Milinda Pointer, MD [facility]  Showing future appointments within next 90 days and meeting all other requirements Disposition: Discharge home  Discharge (Date  Time): 01/24/2021; 1238 hrs.   Primary Care Physician: [name], MD Location: [facility] Note by: Gaspar Cola, MD Date: 01/24/2021; Time: 5:57 PM  Disclaimer:  Medicine is not an Chief Strategy Officer. The only guarantee in medicine is that nothing is guaranteed. It is important to note that the decision to proceed with this intervention was based on the information collected from the patient. The Data and conclusions were drawn from the patient's questionnaire, the interview, and the physical examination. Because the information was provided in large part by the patient, it cannot be guaranteed that it has not been purposely or unconsciously manipulated. Every effort has been made to obtain as much relevant data as possible for this evaluation. It is important to note that the conclusions that lead to this procedure are derived in large part from the available data. Always take into account that the treatment will also be dependent on availability of resources and existing treatment guidelines, considered by other Pain Management Practitioners as being common knowledge and practice, at the time of the intervention. For Medico-Legal purposes, it is also important to point out that variation in procedural techniques and pharmacological choices are the acceptable norm. The indications, contraindications, technique, and results of the above procedure should only be interpreted and judged by a Board-Certified Interventional Pain Specialist with extensive familiarity and expertise in the same exact procedure and technique.

## 2021-01-24 ENCOUNTER — Ambulatory Visit (HOSPITAL_BASED_OUTPATIENT_CLINIC_OR_DEPARTMENT_OTHER): Payer: Medicare Other | Admitting: Pain Medicine

## 2021-01-24 ENCOUNTER — Other Ambulatory Visit: Payer: Self-pay

## 2021-01-24 ENCOUNTER — Ambulatory Visit
Admission: RE | Admit: 2021-01-24 | Discharge: 2021-01-24 | Disposition: A | Discharge status: Home / Self Care | Payer: Medicare Other | Source: Ambulatory Visit | Attending: Pain Medicine | Admitting: Pain Medicine

## 2021-01-24 VITALS — BP 131/82 | HR 95 | Temp 97.2°F | Resp 20 | Ht <= 58 in | Wt 110.0 lb

## 2021-01-24 DIAGNOSIS — Z7901 Long term (current) use of anticoagulants: Secondary | ICD-10-CM | POA: Insufficient documentation

## 2021-01-24 DIAGNOSIS — S3210XS Unspecified fracture of sacrum, sequela: Secondary | ICD-10-CM

## 2021-01-24 DIAGNOSIS — G8929 Other chronic pain: Secondary | ICD-10-CM

## 2021-01-24 DIAGNOSIS — M533 Sacrococcygeal disorders, not elsewhere classified: Secondary | ICD-10-CM | POA: Diagnosis not present

## 2021-01-24 DIAGNOSIS — M5388 Other specified dorsopathies, sacral and sacrococcygeal region: Secondary | ICD-10-CM | POA: Diagnosis not present

## 2021-01-24 DIAGNOSIS — M545 Low back pain, unspecified: Secondary | ICD-10-CM | POA: Insufficient documentation

## 2021-01-24 MED ORDER — METHYLPREDNISOLONE ACETATE 80 MG/ML IJ SUSP
INTRAMUSCULAR | Status: AC
  Filled 2021-01-24: qty 1

## 2021-01-24 MED ORDER — METHYLPREDNISOLONE ACETATE 80 MG/ML IJ SUSP
80.0000 mg | Freq: Once | INTRAMUSCULAR | Status: AC
  Administered 2021-01-24: 80 mg via INTRA_ARTICULAR
  Filled 2021-01-24: qty 1

## 2021-01-24 MED ORDER — ROPIVACAINE HCL 2 MG/ML IJ SOLN
9.0000 mL | Freq: Once | INTRAMUSCULAR | Status: AC
  Administered 2021-01-24: 9 mL via INTRA_ARTICULAR

## 2021-01-24 MED ORDER — LIDOCAINE HCL 2 % IJ SOLN
20.0000 mL | Freq: Once | INTRAMUSCULAR | Status: AC
  Administered 2021-01-24: 200 mg

## 2021-01-24 MED ORDER — ROPIVACAINE HCL 2 MG/ML IJ SOLN
INTRAMUSCULAR | Status: AC
  Filled 2021-01-24: qty 20

## 2021-01-24 MED ORDER — LIDOCAINE HCL (PF) 2 % IJ SOLN
INTRAMUSCULAR | Status: AC
  Filled 2021-01-24: qty 10

## 2021-01-24 NOTE — Patient Instructions (Signed)

## 2021-01-25 ENCOUNTER — Telehealth: Payer: Self-pay

## 2021-01-25 NOTE — Telephone Encounter (Signed)
Called patient Amanda Soto. States she didn't sleep well last night. Instructed to use heat today and to call back if needed.

## 2021-01-26 ENCOUNTER — Telehealth: Payer: Self-pay | Admitting: Pain Medicine

## 2021-01-26 NOTE — Telephone Encounter (Signed)
this patient just had an SI joint injection on 01/24/21 and has called today stating that it seems to be hurting worse at this point.  Denies any redness or swelling at the site.  no other issues just increased pain.  I did go through the fact that the steroids have to have time to work etc.  I did tell her I would send you a message just in case I was missing something.    I spoke with DR Dossie Arbour and he wanted me to call the patient back for more specifics.    Patient reports that she is having a throbbing pain right around the injection site.  She states that right now she is not hurting.  She has been up on her feet and the pain seems to have simmered down. I did offer her a Toradol/Norflex for Friday if she feels she needs it on Friday.

## 2021-01-26 NOTE — Telephone Encounter (Signed)
Had SI injection on 01-24-21. I informed patient that may be 4-10 days until results of steroids obtained.

## 2021-01-26 NOTE — Telephone Encounter (Signed)
Patient states she has had no relief from the injections and seems to be hurting worse. Please call to assess patient and give her advice. Thank you

## 2021-02-06 NOTE — Progress Notes (Signed)
Patient: Amanda Soto  Service Category: E/M  Provider: Francisco A Naveira, MD  DOB: 07/22/1924  DOS: 02/07/2021  Location: Office  MRN: [id number]  Setting: Ambulatory outpatient  Referring Provider: Sonnenberg, Eric G, MD  Type: Established Patient  Specialty: Interventional Pain Management  PCP: Sonnenberg, Eric G, MD  Location: Remote location  Delivery: TeleHealth     Virtual Encounter - Pain Management PROVIDER NOTE: Information contained herein reflects review and annotations entered in association with encounter. Interpretation of such information and data should be left to medically-trained personnel. Information provided to patient can be located elsewhere in the medical record under "Patient Instructions". Document created using STT-dictation technology, any transcriptional errors that may result from process are unintentional.    Contact & Pharmacy Preferred: [phone] Home: [phone] (home) Mobile: [phone] (mobile) E-mail: [email or web address]  [id number] - [place], [place] - [address] [place] [address] Phone: [phone] Fax: [phone]   Pre-screening  Amanda Soto offered "in-person" vs "virtual" encounter. She indicated preferring virtual for this encounter.   Reason COVID-19*  Social distancing based on CDC and AMA recommendations.   I contacted Amanda Soto on 02/07/2021 via telephone.      I clearly identified myself as Francisco A Naveira, MD. I verified that I was speaking with the correct person using two identifiers (Name: Amanda Soto, and date of birth: 02/23/1925).  Consent I sought verbal advanced consent from Amanda Soto for virtual visit interactions. I informed Amanda Soto of possible security and privacy concerns, risks, and limitations associated with providing "not-in-person" medical evaluation and management services. I also informed Amanda Soto of the availability of "in-person" appointments. Finally, I informed  her that there would be a charge for the virtual visit and that she could be  personally, fully or partially, financially responsible for it. [name] expressed understanding and agreed to proceed.   Historic Elements   Amanda Soto is a 87 y.o. year old, female patient evaluated today after our last contact on 01/26/2021. Amanda Soto  has a past medical history of Acute postoperative pain (12/17/2016), Anemia (11/10/2015), Arthritis, Atrial fibrillation (HCC), CHF (congestive heart failure) (HCC), Chickenpox, Chronic abdominal pain (01/16/2014), Closed Colles' fracture (12/17/2016), Degenerative arthritis of hip (08/19/2014), Degenerative arthritis of lumbar spine (11/18/2013), Depression, Diverticulitis, GERD (gastroesophageal reflux disease), Heart murmur, Heart rate slow, Hyperlipidemia, Hypertension, Hypothyroid, Neuritis or radiculitis due to rupture of lumbar intervertebral disc (11/18/2013), Skin cancer (2016 ?), Symptomatic anemia (11/09/2015), and Trochanteric bursitis of right hip (11/18/2013). She also  has a past surgical history that includes Cataract extraction; Partial hysterectomy; Appendectomy; and Tonsillectomy. Amanda Soto has a current medication list which includes the following prescription(s): acetaminophen, apixaban, vitamin d3, fluticasone, furosemide, multiple vitamins-minerals, omeprazole, sotalol, synthroid, vitamin b-12, and tramadol. She  reports that she has never smoked. She has never used smokeless tobacco. She reports current alcohol use of about 1.0 standard drink of alcohol per week. She reports that she does not use drugs. Amanda Soto is allergic to hydrocodone, ciprofloxacin, and latex.   HPI  Today, she is being contacted for a post-procedure assessment.  She indicates that the procedure went only did not help her with the pain, but right now she is experiencing a lot more pain in the area of the hip and the lower back with the lower back being the worst. One procedures as she  has had, she seems to do well with the radiofrequency ablation and therefore we will go ahead   and schedule her to have her third right-sided lumbar facet radiofrequency ablation under fluoroscopic guidance and IV sedation.  Meanwhile she indicates having quite a bit of pain in the lower back and she says that she has been taking 2 of the tramadol's at a time but they only last for 4 hours.  She refers not having any problems when she takes them like that, but she is only taking it once a day.  I reminded her that she could be taking 1 tablet p.o. every 6 hours, but she says that it is not enough.  I have also reviewed her chart and apparently she is unable to take the hydrocodone, which is the neck step up due to the fact that it makes her feel "funny".  In view of this, I have instructed her to begin taking 2 of the tramadol every 6 hours.  We will be scheduling her for a right-sided lumbar facet radiofrequency ablation, as soon as we get the approval.  The plan was shared with the patient who understood and accepted.  RTCB: 09/04/2021  Post-Procedure Evaluation  Procedure (01/24/2021):  Type: Diagnostic Sacroiliac Joint Steroid Injection          Region: Superior Lumbosacral Region Level: PSIS (Posterior Superior Iliac Spine) Laterality: Right  Pre-procedure pain level: 9/10 Post-procedure: 0/10 (100% relief)  Anxiolysis: none.  Effectiveness during initial hour after procedure (Ultra-Short Term Relief): 100 %.  Local anesthetic used: Long-acting (4-6 hours) Effectiveness: Defined as any analgesic benefit obtained secondary to the administration of local anesthetics. This carries significant diagnostic value as to the etiological location, or anatomical origin, of the pain. Duration of benefit is expected to coincide with the duration of the local anesthetic used.  Effectiveness during initial 4-6 hours after procedure (Short-Term Relief): 100 % (numbness and pain relief lasted to approx 1300 that  day.).  Long-term benefit: Defined as any relief past the pharmacologic duration of the local anesthetics.  Effectiveness past the initial 6 hours after procedure (Long-Term Relief): 0 %.  Benefits, current: Defined as benefit present at the time of this evaluation.   Analgesia:  No benefit Function: No improvement ROM: No improvement  Pharmacotherapy Assessment   Analgesic: Tramadol 50 mg, 1 tablet PO q 6 hrs (200 mg/day of tramadol) MME/day: 20 mg/day.   Monitoring: [place] PMP: PDMP reviewed during this encounter.       Pharmacotherapy: No side-effects or adverse reactions reported. Compliance: No problems identified. Effectiveness: Clinically acceptable. Plan: Refer to "POC". UDS:  Summary  Date Value Ref Range Status  10/24/2020 Note  Final    Comment:    ==================================================================== [employer] Select 13 (MW) ==================================================================== Test                             Result       Flag       Units  Drug Present and Declared for Prescription Verification   Tramadol                       >6667        EXPECTED   ng/mg creat   O-Desmethyltramadol            >6667        EXPECTED   ng/mg creat   N-Desmethyltramadol            >6667        EXPECTED   ng/mg creat  Source of tramadol is a prescription medication. O-desmethyltramadol    and N-desmethyltramadol are expected metabolites of tramadol.  ==================================================================== Test                      Result    Flag   Units      Ref Range   Creatinine              75               mg/dL      >=20 ==================================================================== Declared Medications:  The flagging and interpretation on this report are based on the  following declared medications.  Unexpected results may arise from  inaccuracies in the declared medications.   **Note: The testing scope of this panel includes  these medications:   Tramadol   **Note: The testing scope of this panel does not include the  following reported medications:   Acetaminophen  Apixaban  Cyanocobalamin  Fluticasone  Furosemide  Levothyroxine  Multivitamin  Omeprazole  Sotalol  Vitamin D3 ==================================================================== For clinical consultation, please call [phone]. ====================================================================      Laboratory Chemistry Profile   Renal Lab Results  Component Value Date   BUN 14 05/11/2020   CREATININE 0.52 05/11/2020   LABCREA 90 01/11/2020   BCR 24 (H) 04/26/2020   GFR 80.19 05/11/2020   GFRAA >60 12/18/2019   GFRNONAA >60 12/18/2019    Hepatic Lab Results  Component Value Date   AST 19 04/26/2020   ALT 9 04/26/2020   ALBUMIN 4.2 04/26/2020   ALKPHOS 55 04/26/2020   LIPASE 181 11/02/2013    Electrolytes Lab Results  Component Value Date   NA 134 (L) 05/11/2020   K 4.0 05/11/2020   CL 97 05/11/2020   CALCIUM 9.3 05/11/2020   MG 2.0 03/30/2019   PHOS 3.1 04/26/2020    Bone Lab Results  Component Value Date   VD25OH 28.73 (L) 01/11/2020   25OHVITD1 14 (L) 03/30/2019   25OHVITD2 <1.0 03/30/2019   25OHVITD3 14 03/30/2019    Inflammation (CRP: Acute Phase) (ESR: Chronic Phase) Lab Results  Component Value Date   CRP <1 03/30/2019   ESRSEDRATE 50 (H) 03/30/2019         Note: Above Lab results reviewed.  Imaging  DG PAIN CLINIC C-ARM 1-60 MIN NO REPORT Fluoro was used, but no Radiologist interpretation will be provided.  Please refer to "NOTES" tab for provider progress note.  Assessment  The primary encounter diagnosis was Chronic pain syndrome. Diagnoses of Chronic low back pain (1ry area of Pain) (Right), Chronic sacroiliac joint pain (Right), Grade 1 Anterolisthesis of L4 over L5 (5 mm), Pharmacologic therapy, Chronic use of opiate for therapeutic purpose, Encounter for medication management,  and Chronic anticoagulation (Eliquis) were also pertinent to this visit.  Plan of Care  Problem-specific:  No problem-specific Assessment & Plan notes found for this encounter.  Amanda Soto has a current medication list which includes the following long-term medication(s): apixaban, fluticasone, furosemide, sotalol, synthroid, and tramadol.  Pharmacotherapy (Medications Ordered): Meds ordered this encounter  Medications   traMADol (ULTRAM) 50 MG tablet    Sig: Take 2 tablets (100 mg total) by mouth every 6 (six) hours. Each refill must last 30 days    Dispense:  240 tablet    Refill:  5    Not a duplicate. Do NOT delete! Dispense 1 day early if closed on refill date. Avoid benzodiazepines within 8 hours of opioids. Do  not send refill requests. Cancel the prior script.    Orders:  Orders Placed This Encounter  Procedures   Radiofrequency,Lumbar    Scheduling Instructions:     Side(s): Right-sided     Level: L3-4, L4-5, & L5-S1 Facets (L2, L3, L4, L5, & S1 Medial Branch Nerves)     Sedation: With Sedation.     Timeframe: Today    Order Specific Question:   Where will this procedure be performed?    Answer:   [facility]    Follow-up plan:   Return for RFA (43mn): (R) L-FCT RFA #3, (Blood Thinner Protocol).     Interventional Therapies  Risk  Complexity Considerations:   NOTE: Eliquis Anticoagulation (Stop:3 days  Restart: 6 hrs)  Advanced age  Latex allergy   Planned  Pending:   Palliative right lumbar facet RFA #3    Under consideration:   Palliative interventions    Completed:   Palliative/therapeutic caudal ESI x1 (07/21/2019) (100/100/90/90)  Palliative right lumbar facet (L2, L3, L4, L5, & S1) MBB x9 (09/07/2020) (100/100/90/75)  Palliative/Therapeutic right lumbar facet RFA x2 (03/27/2018) (100/100/100/90)  Palliative right sacroiliac joint block x4 (01/24/2021) (100/100/0/0)  Palliative/therapeutic right-sided SI joint RFA x1 (03/27/2018) (100/90/0)     Therapeutic  Palliative (PRN) options:   Palliative/therapeutic caudal ESI #2  Palliative right lumbar facet block #10  Palliative/Therapeutic right lumbar facet RFA #3     Recent Visits Date Type Provider Dept  01/24/21 Procedure visit NMilinda Pointer MD [facility]  01/12/21 Office Visit NMilinda Pointer MD [facility]  Showing recent visits within past 90 days and meeting all other requirements Today's Visits Date Type Provider Dept  02/07/21 Telemedicine NMilinda Pointer MD [facility]  Showing today's visits and meeting all other requirements Future Appointments No visits were found meeting these conditions. Showing future appointments within next 90 days and meeting all other requirements I discussed the assessment and treatment plan with the patient. The patient was provided an opportunity to ask questions and all were answered. The patient agreed with the plan and demonstrated an understanding of the instructions.  Patient advised to call back or seek an in-person evaluation if the symptoms or condition worsens.  Duration of encounter: 15 minutes.  Note by: FGaspar Cola MD Date: 02/07/2021; Time: 5:48 PM

## 2021-02-07 ENCOUNTER — Ambulatory Visit: Payer: Medicare Other | Attending: Pain Medicine | Admitting: Pain Medicine

## 2021-02-07 ENCOUNTER — Other Ambulatory Visit: Payer: Self-pay

## 2021-02-07 DIAGNOSIS — G894 Chronic pain syndrome: Secondary | ICD-10-CM

## 2021-02-07 DIAGNOSIS — Z79899 Other long term (current) drug therapy: Secondary | ICD-10-CM | POA: Diagnosis not present

## 2021-02-07 DIAGNOSIS — M533 Sacrococcygeal disorders, not elsewhere classified: Secondary | ICD-10-CM

## 2021-02-07 DIAGNOSIS — Z79891 Long term (current) use of opiate analgesic: Secondary | ICD-10-CM | POA: Diagnosis not present

## 2021-02-07 DIAGNOSIS — G8929 Other chronic pain: Secondary | ICD-10-CM | POA: Diagnosis not present

## 2021-02-07 DIAGNOSIS — M545 Low back pain, unspecified: Secondary | ICD-10-CM

## 2021-02-07 DIAGNOSIS — Z7901 Long term (current) use of anticoagulants: Secondary | ICD-10-CM | POA: Diagnosis not present

## 2021-02-07 DIAGNOSIS — M4310 Spondylolisthesis, site unspecified: Secondary | ICD-10-CM | POA: Diagnosis not present

## 2021-02-07 MED ORDER — TRAMADOL HCL 50 MG PO TABS: 100.0000 mg | ORAL_TABLET | Freq: Four times a day (QID) | ORAL | 5 refills | Status: DC

## 2021-02-08 ENCOUNTER — Telehealth: Payer: Self-pay

## 2021-02-08 NOTE — Telephone Encounter (Signed)
Called patient to go over her pre procedure instructions.  Instructed patient to not eat or drink for 8 hours, bring a driver and to stop Eliquis for 3 days prior to procedure.  This message forwarded to [facility] to schedule when approval received.

## 2021-02-09 ENCOUNTER — Telehealth: Payer: Self-pay

## 2021-02-09 NOTE — Telephone Encounter (Signed)
I have authorization for her RFA. Do you need to get blood thinner clearance? And call with instructions?

## 2021-02-17 ENCOUNTER — Ambulatory Visit (INDEPENDENT_AMBULATORY_CARE_PROVIDER_SITE_OTHER): Payer: Medicare Other | Admitting: Family Medicine

## 2021-02-17 ENCOUNTER — Encounter: Payer: Self-pay | Admitting: Family Medicine

## 2021-02-17 ENCOUNTER — Other Ambulatory Visit: Payer: Self-pay

## 2021-02-17 DIAGNOSIS — R5382 Chronic fatigue, unspecified: Secondary | ICD-10-CM

## 2021-02-17 DIAGNOSIS — E039 Hypothyroidism, unspecified: Secondary | ICD-10-CM | POA: Diagnosis not present

## 2021-02-17 DIAGNOSIS — G8929 Other chronic pain: Secondary | ICD-10-CM | POA: Diagnosis not present

## 2021-02-17 DIAGNOSIS — M545 Low back pain, unspecified: Secondary | ICD-10-CM | POA: Diagnosis not present

## 2021-02-17 NOTE — Assessment & Plan Note (Signed)
This is a chronic ongoing issue.  I wonder if her sotalol is playing a role.  I encouraged her to discuss this with her cardiologist.

## 2021-02-17 NOTE — Patient Instructions (Signed)
Nice to see you. Please ask your cardiologist whether or not the sotalol could be contributing to your fatigue.

## 2021-02-17 NOTE — Progress Notes (Signed)
[name], MD Phone: [phone]  Amanda Soto is a 87 y.o. female who presents today for follow-up.  Chronic back pain: The patient had an injection somewhat recently and notes it did not help as much as it has in the past..  She has a radiofrequency ablation scheduled for later this month.  She wonders if this will be helpful for her back.  Fatigue: This is a chronic ongoing issue.  Lab work has been completed previously with no obvious cause found.  It was previously recommended that she speak with her cardiologist regarding the sotalol to see if that would be contributing as she reports that her symptoms started around the time she started on this medication.  She denies depression.  Hypothyroidism: She continues on Synthroid.  No skin changes.  She has chronic cold intolerance.  Social History   Tobacco Use  Smoking Status Never  Smokeless Tobacco Never    Current Outpatient Medications on File Prior to Visit  Medication Sig Dispense Refill   acetaminophen (TYLENOL) 325 MG tablet Take 500 mg by mouth every 6 (six) hours as needed for moderate pain, fever or headache.     apixaban (ELIQUIS) 2.5 MG TABS tablet Take 1 tablet (2.5 mg total) by mouth 2 (two) times daily. 180 tablet 3   Cholecalciferol (VITAMIN D3) 50 MCG (2000 UT) CHEW Chew 1 tablet by mouth daily.     fluticasone (FLONASE) 50 MCG/ACT nasal spray SPRAY 2 SPRAYS INTO EACH NOSTRIL EVERY DAY     furosemide (LASIX) 20 MG tablet TAKE 1 TABLET BY MOUTH EVERY DAY 90 tablet 1   Multiple Vitamins-Minerals (PRESERVISION AREDS 2 PO) Take by mouth in the morning and at bedtime.     omeprazole (PRILOSEC) 40 MG capsule TAKE 1 CAPSULE BY MOUTH EVERY DAY 15-20 MINUTES BEFORE MEALS     sotalol (BETAPACE) 80 MG tablet Take 40 mg by mouth 2 (two) times daily.      SYNTHROID 50 MCG tablet TAKE 1 TABLET BY MOUTH EVERY DAY BEFORE BREAKFAST 90 tablet 1   traMADol (ULTRAM) 50 MG tablet Take 2 tablets (100 mg total) by mouth every 6  (six) hours. Each refill must last 30 days 240 tablet 5   vitamin B-12 (CYANOCOBALAMIN) 1000 MCG tablet Take by mouth.     No current facility-administered medications on file prior to visit.     ROS see history of present illness  Objective  Physical Exam Vitals:   02/17/21 1416  BP: 125/60  Pulse: (!) 50  Temp: 98.8 F (37.1 C)  SpO2: 98%    BP Readings from Last 3 Encounters:  02/17/21 125/60  01/24/21 131/82  01/12/21 (!) 111/56   Wt Readings from Last 3 Encounters:  02/17/21 111 lb 3.2 oz (50.4 kg)  01/24/21 110 lb (49.9 kg)  01/12/21 110 lb (49.9 kg)    Physical Exam Constitutional:      General: She is not in acute distress.    Appearance: She is not diaphoretic.  Cardiovascular:     Rate and Rhythm: Regular rhythm. Bradycardia present.     Heart sounds: Normal heart sounds.  Pulmonary:     Effort: Pulmonary effort is normal.     Breath sounds: Normal breath sounds.  Skin:    General: Skin is warm and dry.  Neurological:     Mental Status: She is alert.     Assessment/Plan: Please see individual problem list.  Problem List Items Addressed This Visit     Chronic low  back pain (1ry area of Pain) (Right) (Chronic)    She is going to have a radiofrequency ablation.  She is somewhat concerned about whether or not this will be helpful.  I encouraged her to discuss further with her pain specialist though I did note that these procedures seem to be helpful in most patients that I have witnessed in the past.  Advised that certainly there probably would be some risk that it would not help or could somehow make her pain worse though in theory this should help with her pain if they hit the right area.      Chronic fatigue    This is a chronic ongoing issue.  I wonder if her sotalol is playing a role.  I encouraged her to discuss this with her cardiologist.      Hypothyroidism    Adequately controlled based on last TSH.  She will continue Synthroid 50 mcg once  daily.       Return in about 4 months (around 06/19/2021) for Follow-up hypothyroidism with labs.  This visit occurred during the SARS-CoV-2 public health emergency.  Safety protocols were in place, including screening questions prior to the visit, additional usage of staff PPE, and extensive cleaning of exam room while observing appropriate contact time as indicated for disinfecting solutions.    [name], MD [place]

## 2021-02-17 NOTE — Assessment & Plan Note (Signed)
Adequately controlled based on last TSH.  She will continue Synthroid 50 mcg once daily.

## 2021-02-17 NOTE — Assessment & Plan Note (Signed)
She is going to have a radiofrequency ablation.  She is somewhat concerned about whether or not this will be helpful.  I encouraged her to discuss further with her pain specialist though I did note that these procedures seem to be helpful in most patients that I have witnessed in the past.  Advised that certainly there probably would be some risk that it would not help or could somehow make her pain worse though in theory this should help with her pain if they hit the right area.

## 2021-02-21 ENCOUNTER — Telehealth: Payer: Self-pay

## 2021-02-21 ENCOUNTER — Other Ambulatory Visit: Payer: Self-pay | Admitting: *Deleted

## 2021-02-21 NOTE — Telephone Encounter (Signed)
Pt need someone to contact her regarding her medication her PCP wants to know about a certain medication that she is taking

## 2021-02-21 NOTE — Telephone Encounter (Signed)
Per patient: Dr. Clayborn Bigness wants [place] to fax him about her upcoming procedure. Done. JCS

## 2021-02-23 ENCOUNTER — Telehealth: Payer: Self-pay | Admitting: *Deleted

## 2021-02-23 NOTE — Telephone Encounter (Signed)
Patient informed that we have received permission to stop Eliquis 3 days before procedure.

## 2021-02-25 ENCOUNTER — Other Ambulatory Visit: Payer: Self-pay | Admitting: Family Medicine

## 2021-02-27 NOTE — Progress Notes (Signed)
PROVIDER NOTE: Information contained herein reflects review and annotations entered in association with encounter. Interpretation of such information and data should be left to medically-trained personnel. Information provided to patient can be located elsewhere in the medical record under "Patient Instructions". Document created using STT-dictation technology, any transcriptional errors that may result from process are unintentional.    Patient: Amanda Soto  Service Category: Procedure  Provider: Gaspar Cola, MD  DOB: May 22, 1925  DOS: 02/28/2021  Location: [facility]  MRN: [id number]  Setting: Ambulatory - outpatient  Referring Provider: Milinda Pointer, MD  Type: Established Patient  Specialty: Interventional Pain Management  PCP: [name], MD   Primary Reason for Visit: Interventional Pain Management Treatment. CC: Back Pain    Procedure:          Anesthesia, Analgesia, Anxiolysis:  Type: Thermal Lumbar Facet, Medial Branch Radiofrequency Ablation/Neurotomy           Primary Purpose: Therapeutic Region: Posterolateral Lumbosacral Spine Level: L2, L3, L4, L5, & S1 Medial Branch Level(s). These levels will denervate the L3-4, L4-5, and the L5-S1 lumbar facet joints. Laterality: Right  Type: Minimal Anxiolysis combined with Local Anesthesia Indication(s): Analgesia and Anxiety Route: Intravenous (IV) IV Access: Secured Sedation: Meaningful verbal contact was maintained at all times during the procedure  Local Anesthetic: Lidocaine 1-2%  Position: Prone   Indications: 1. Lumbar facet syndrome (Right)   2. Spondylosis without myelopathy or radiculopathy, lumbosacral region   3. Grade 1 Anterolisthesis of L4 over L5 (5 mm)   4. DDD (degenerative disc disease), lumbar   5. Lumbar facet arthropathy   6. Lumbar spondylosis   7. Chronic low back pain (1ry area of Pain) (Right)   8. Encounter for therapeutic procedure   9. Chronic anticoagulation  (Eliquis)   10. History of allergy to latex   11. Latex precautions, history of latex allergy    [name] has been dealing with the above chronic pain for longer than three months and has either failed to respond, was unable to tolerate, or simply did not get enough benefit from other more conservative therapies including, but not limited to: 1. Over-the-counter medications 2. Anti-inflammatory medications 3. Muscle relaxants 4. Membrane stabilizers 5. Opioids 6. Physical therapy and/or chiropractic manipulation 7. Modalities (Heat, ice, etc.) 8. Invasive techniques such as nerve blocks. [name] has attained more than 50% relief of the pain from a series of diagnostic injections conducted in separate occasions.  Pain Score: Pre-procedure: 5 /10 Post-procedure: 0-No pain/10    Pre-op H&P Assessment:  Amanda Soto is a 87 y.o. (year old), female patient, seen today for interventional treatment. She  has a past surgical history that includes Cataract extraction; Partial hysterectomy; Appendectomy; and Tonsillectomy. Amanda Soto has a current medication list which includes the following prescription(s): acetaminophen, vitamin d3, diltiazem, fluticasone, furosemide, multiple vitamins-minerals, omeprazole, synthroid, tramadol, vitamin b-12, apixaban, and sotalol. Her primarily concern today is the Back Pain  Initial Vital Signs:  Pulse/HCG Rate: 72ECG Heart Rate: (!) 103 Temp: (!) 97 F (36.1 C) Resp: 18 BP: 117/87 SpO2: 97 %  BMI: Estimated body mass index is 23.41 kg/m as calculated from the following:   Height as of this encounter: '4\' 10"'$  (1.473 m).   Weight as of this encounter: 112 lb (50.8 kg).  Risk Assessment: Allergies: Reviewed. She is allergic to hydrocodone, ciprofloxacin, and latex.  Allergy Precautions: None required Coagulopathies: Reviewed. None identified.  Blood-thinner therapy: None at this time Active Infection(s): Reviewed. None identified.  Amanda Soto is  afebrile  Site Confirmation: Amanda Soto was asked to confirm the procedure and laterality before marking the site Procedure checklist: Completed Consent: Before the procedure and under the influence of no sedative(s), amnesic(s), or anxiolytics, the patient was informed of the treatment options, risks and possible complications. To fulfill our ethical and legal obligations, as recommended by the American Medical Association's Code of Ethics, I have informed the patient of my clinical impression; the nature and purpose of the treatment or procedure; the risks, benefits, and possible complications of the intervention; the alternatives, including doing nothing; the risk(s) and benefit(s) of the alternative treatment(s) or procedure(s); and the risk(s) and benefit(s) of doing nothing. The patient was provided information about the general risks and possible complications associated with the procedure. These may include, but are not limited to: failure to achieve desired goals, infection, bleeding, organ or nerve damage, allergic reactions, paralysis, and death. In addition, the patient was informed of those risks and complications associated to Spine-related procedures, such as failure to decrease pain; infection (i.e.: Meningitis, epidural or intraspinal abscess); bleeding (i.e.: epidural hematoma, subarachnoid hemorrhage, or any other type of intraspinal or peri-dural bleeding); organ or nerve damage (i.e.: Any type of peripheral nerve, nerve root, or spinal cord injury) with subsequent damage to sensory, motor, and/or autonomic systems, resulting in permanent pain, numbness, and/or weakness of one or several areas of the body; allergic reactions; (i.e.: anaphylactic reaction); and/or death. Furthermore, the patient was informed of those risks and complications associated with the medications. These include, but are not limited to: allergic reactions (i.e.: anaphylactic or anaphylactoid reaction(s)); adrenal  axis suppression; blood sugar elevation that in diabetics may result in ketoacidosis or comma; water retention that in patients with history of congestive heart failure may result in shortness of breath, pulmonary edema, and decompensation with resultant heart failure; weight gain; swelling or edema; medication-induced neural toxicity; particulate matter embolism and blood vessel occlusion with resultant organ, and/or nervous system infarction; and/or aseptic necrosis of one or more joints. Finally, the patient was informed that Medicine is not an exact science; therefore, there is also the possibility of unforeseen or unpredictable risks and/or possible complications that may result in a catastrophic outcome. The patient indicated having understood very clearly. We have given the patient no guarantees and we have made no promises. Enough time was given to the patient to ask questions, all of which were answered to the patient's satisfaction. Amanda Soto has indicated that she wanted to continue with the procedure. Attestation: I, the ordering provider, attest that I have discussed with the patient the benefits, risks, side-effects, alternatives, likelihood of achieving goals, and potential problems during recovery for the procedure that I have provided informed consent. Date  Time: 02/28/2021  9:10 AM  Pre-Procedure Preparation:  Monitoring: As per clinic protocol. Respiration, ETCO2, SpO2, BP, heart rate and rhythm monitor placed and checked for adequate function Safety Precautions: Patient was assessed for positional comfort and pressure points before starting the procedure. Time-out: I initiated and conducted the "Time-out" before starting the procedure, as per protocol. The patient was asked to participate by confirming the accuracy of the "Time Out" information. Verification of the correct person, site, and procedure were performed and confirmed by me, the nursing staff, and the patient. "Time-out"  conducted as per Joint Commission's Universal Protocol (UP.01.01.01). Time: 1016  Description of Procedure:          Laterality: Right Levels:  L2, L3, L4, L5, & S1 Medial Branch Level(s),  at the L3-4, L4-5, and the L5-S1 lumbar facet joints. Area Prepped: Lumbosacral DuraPrep (Iodine Povacrylex [0.7% available iodine] and Isopropyl Alcohol, 74% w/w) Safety Precautions: Aspiration looking for blood return was conducted prior to all injections. At no point did we inject any substances, as a needle was being advanced. Before injecting, the patient was told to immediately notify me if she was experiencing any new onset of "ringing in the ears, or metallic taste in the mouth". No attempts were made at seeking any paresthesias. Safe injection practices and needle disposal techniques used. Medications properly checked for expiration dates. SDV (single dose vial) medications used. After the completion of the procedure, all disposable equipment used was discarded in the proper designated medical waste containers. Local Anesthesia: Protocol guidelines were followed. The patient was positioned over the fluoroscopy table. The area was prepped in the usual manner. The time-out was completed. The target area was identified using fluoroscopy. A 12-in long, straight, sterile hemostat was used with fluoroscopic guidance to locate the targets for each level blocked. Once located, the skin was marked with an approved surgical skin marker. Once all sites were marked, the skin (epidermis, dermis, and hypodermis), as well as deeper tissues (fat, connective tissue and muscle) were infiltrated with a small amount of a short-acting local anesthetic, loaded on a 10cc syringe with a 25G, 1.5-in  Needle. An appropriate amount of time was allowed for local anesthetics to take effect before proceeding to the next step. Local Anesthetic: Lidocaine 2.0% The unused portion of the local anesthetic was discarded in the proper designated  containers. Technical explanation of process:  Radiofrequency Ablation (RFA) L2 Medial Branch Nerve RFA: The target area for the L2 medial branch is at the junction of the postero-lateral aspect of the superior articular process and the superior, posterior, and medial edge of the transverse process of L3. Under fluoroscopic guidance, a Radiofrequency needle was inserted until contact was made with os over the superior postero-lateral aspect of the pedicular shadow (target area). Sensory and motor testing was conducted to properly adjust the position of the needle. Once satisfactory placement of the needle was achieved, the numbing solution was slowly injected after negative aspiration for blood. 2.0 mL of the nerve block solution was injected without difficulty or complication. After waiting for at least 3 minutes, the ablation was performed. Once completed, the needle was removed intact. L3 Medial Branch Nerve RFA: The target area for the L3 medial branch is at the junction of the postero-lateral aspect of the superior articular process and the superior, posterior, and medial edge of the transverse process of L4. Under fluoroscopic guidance, a Radiofrequency needle was inserted until contact was made with os over the superior postero-lateral aspect of the pedicular shadow (target area). Sensory and motor testing was conducted to properly adjust the position of the needle. Once satisfactory placement of the needle was achieved, the numbing solution was slowly injected after negative aspiration for blood. 2.0 mL of the nerve block solution was injected without difficulty or complication. After waiting for at least 3 minutes, the ablation was performed. Once completed, the needle was removed intact. L4 Medial Branch Nerve RFA: The target area for the L4 medial branch is at the junction of the postero-lateral aspect of the superior articular process and the superior, posterior, and medial edge of the transverse  process of L5. Under fluoroscopic guidance, a Radiofrequency needle was inserted until contact was made with os over the superior postero-lateral aspect of the pedicular shadow (target area).  Sensory and motor testing was conducted to properly adjust the position of the needle. Once satisfactory placement of the needle was achieved, the numbing solution was slowly injected after negative aspiration for blood. 2.0 mL of the nerve block solution was injected without difficulty or complication. After waiting for at least 3 minutes, the ablation was performed. Once completed, the needle was removed intact. L5 Medial Branch Nerve RFA: The target area for the L5 medial branch is at the junction of the postero-lateral aspect of the superior articular process of S1 and the superior, posterior, and medial edge of the sacral ala. Under fluoroscopic guidance, a Radiofrequency needle was inserted until contact was made with os over the superior postero-lateral aspect of the pedicular shadow (target area). Sensory and motor testing was conducted to properly adjust the position of the needle. Once satisfactory placement of the needle was achieved, the numbing solution was slowly injected after negative aspiration for blood. 2.0 mL of the nerve block solution was injected without difficulty or complication. After waiting for at least 3 minutes, the ablation was performed. Once completed, the needle was removed intact. S1 Medial Branch Nerve RFA: The target area for the S1 medial branch is located inferior to the junction of the S1 superior articular process and the L5 inferior articular process, posterior, inferior, and lateral to the 6 o'clock position of the L5-S1 facet joint, just superior to the S1 posterior foramen. Under fluoroscopic guidance, the Radiofrequency needle was advanced until contact was made with os over the Target area. Sensory and motor testing was conducted to properly adjust the position of the needle.  Once satisfactory placement of the needle was achieved, the numbing solution was slowly injected after negative aspiration for blood. 2.0 mL of the nerve block solution was injected without difficulty or complication. After waiting for at least 3 minutes, the ablation was performed. Once completed, the needle was removed intact. Radiofrequency lesioning (ablation):  Radiofrequency Generator: NeuroTherm [id number] Sensory Stimulation Parameters: 50 Hz was used to locate & identify the nerve, making sure that the needle was positioned such that there was no sensory stimulation below 0.3 V or above 0.7 V. Motor Stimulation Parameters: 2 Hz was used to evaluate the motor component. Care was taken not to lesion any nerves that demonstrated motor stimulation of the lower extremities at an output of less than 2.5 times that of the sensory threshold, or a maximum of 2.0 V. Lesioning Technique Parameters: Standard Radiofrequency settings. (Not bipolar or pulsed.) Temperature Settings: 80 degrees C Lesioning time: 60 seconds Intra-operative Compliance: Compliant Materials & Medications: Needle(s) (Electrode/Cannula) Type: Teflon-coated, curved tip, Radiofrequency needle(s) Gauge: 22G Length: 10cm Numbing solution: 0.2% PF-Ropivacaine + Triamcinolone (40 mg/mL) diluted to a final concentration of 4 mg of Triamcinolone/mL of Ropivacaine The unused portion of the solution was discarded in the proper designated containers.  Once the entire procedure was completed, the treated area was cleaned, making sure to leave some of the prepping solution back to take advantage of its long term bactericidal properties.    Illustration of the posterior view of the lumbar spine and the posterior neural structures. Laminae of L2 through S1 are labeled. DPRL5, dorsal primary ramus of L5; DPRS1, dorsal primary ramus of S1; DPR3, dorsal primary ramus of L3; FJ, facet (zygapophyseal) joint L3-L4; I, inferior articular process of L4;  LB1, lateral branch of dorsal primary ramus of L1; IAB, inferior articular branches from L3 medial branch (supplies L4-L5 facet joint); IBP, intermediate branch plexus; MB3, medial branch of  dorsal primary ramus of L3; NR3, third lumbar nerve root; S, superior articular process of L5; SAB, superior articular branches from L4 (supplies L4-5 facet joint also); TP3, transverse process of L3.  Vitals:   02/28/21 1100 02/28/21 1110 02/28/21 1120 02/28/21 1131  BP: (!) 87/55 (!) 104/59 (!) 110/52 (!) 100/56  Pulse:      Resp: 17 17    Temp:  (!) 97.2 F (36.2 C)  (!) 97 F (36.1 C)  TempSrc:      SpO2: 91% 94% 97% 96%  Weight:      Height:       Start Time: 1016 hrs. End Time: 1059 hrs.  Imaging Guidance (Spinal):          Type of Imaging Technique: Fluoroscopy Guidance (Spinal) Indication(s): Assistance in needle guidance and placement for procedures requiring needle placement in or near specific anatomical locations not easily accessible without such assistance. Exposure Time: Please see nurses notes. Contrast: None used. Fluoroscopic Guidance: I was personally present during the use of fluoroscopy. "Tunnel Vision Technique" used to obtain the best possible view of the target area. Parallax error corrected before commencing the procedure. "Direction-depth-direction" technique used to introduce the needle under continuous pulsed fluoroscopy. Once target was reached, antero-posterior, oblique, and lateral fluoroscopic projection used confirm needle placement in all planes. Images permanently stored in EMR. Interpretation: No contrast injected. I personally interpreted the imaging intraoperatively. Adequate needle placement confirmed in multiple planes. Permanent images saved into the patient's record.  Antibiotic Prophylaxis:   Anti-infectives (From admission, onward)    None      Indication(s): None identified  Post-operative Assessment:  Post-procedure Vital Signs:  Pulse/HCG Rate:  7295 Temp: (!) 97 F (36.1 C) Resp: 17 BP: (!) 100/56 SpO2: 96 %  EBL: None  Complications: No immediate post-treatment complications observed by team, or reported by patient.  Note: The patient tolerated the entire procedure well. A repeat set of vitals were taken after the procedure and the patient was kept under observation following institutional policy, for this type of procedure. Post-procedural neurological assessment was performed, showing return to baseline, prior to discharge. The patient was provided with post-procedure discharge instructions, including a section on how to identify potential problems. Should any problems arise concerning this procedure, the patient was given instructions to immediately contact us, at any time, without hesitation. In any case, we plan to contact the patient by telephone for a follow-up status report regarding this interventional procedure.  Comments:  No additional relevant information.  Plan of Care  Orders:  Orders Placed This Encounter  Procedures   Radiofrequency,Lumbar    Scheduling Instructions:     Side(s): Right-sided     Level: L3-4, L4-5, & L5-S1 Facets (L2, L3, L4, L5, & S1 Medial Branch Nerves)     Sedation: Patient's choice.     Timeframe: Today    Order Specific Question:   Where will this procedure be performed?    Answer:   [facility]   DG PA[facility] C-ARM 1-60 MIN NO REPORT    Intraoperative interpretation by procedural physician at [place].    Standing Status:   Standing    Number of Occurrences:   1    Order Specific Question:   Reason for exam:    Answer:   Assistance in needle guidance and placement for procedures requiring needle placement in or near specific anatomical locations not easily accessible without such assistance.   Informed Consent Details: Physician/Practitioner Attestation; Transcribe to  consent form and obtain patient signature    Nursing Order: Transcribe to consent form and  obtain patient signature. Note: Always confirm laterality of pain with [name], before procedure.    Order Specific Question:   Physician/Practitioner attestation of informed consent for procedure/surgical case    Answer:   I, the physician/practitioner, attest that I have discussed with the patient the benefits, risks, side effects, alternatives, likelihood of achieving goals and potential problems during recovery for the procedure that I have provided informed consent.    Order Specific Question:   Procedure    Answer:   Lumbar Facet Radiofrequency Ablation    Order Specific Question:   Physician/Practitioner performing the procedure    Answer:   Timberly Yott A. Dossie Arbour, MD    Order Specific Question:   Indication/Reason    Answer:   Low Back Pain, with our without leg pain, due to Facet Joint Arthralgia (Joint Pain) known as Lumbar Facet Syndrome, secondary to Lumbar, and/or Lumbosacral Spondylosis (Arthritis of the Spine), without myelopathy or radiculopathy (Nerve Damage).   Care order/instruction: Please confirm that the patient has stopped the Eliquis (Apixaban) x 3 days prior to procedure or surgery.    Please confirm that the patient has stopped the Eliquis (Apixaban) x 3 days prior to procedure or surgery.    Standing Status:   Standing    Number of Occurrences:   1   Provide equipment / supplies at bedside    "Radiofrequency Tray"; Large hemostat (x1); Small hemostat (x1); Towels (x8); 4x4 sterile sponge pack (x1) Needle type: Teflon-coated Radiofrequency Needle (Disposable  single use) Size: Regular Quantity: 5    Standing Status:   Standing    Number of Occurrences:   1    Order Specific Question:   Specify    Answer:   Radiofrequency Tray   Bleeding precautions    Standing Status:   Standing    Number of Occurrences:   1   Latex precautions    Activate Latex-Free Protocol.    Standing Status:   Standing    Number of Occurrences:   1    Chronic Opioid Analgesic:  Tramadol  50 mg, 1 tablet PO q 6 hrs (200 mg/day of tramadol) MME/day: 20 mg/day.   Medications ordered for procedure: Meds ordered this encounter  Medications   lidocaine (XYLOCAINE) 2 % (with pres) injection 400 mg   lactated ringers infusion 1,000 mL   midazolam (VERSED) 5 MG/5ML injection 0.5-2 mg    Make sure Flumazenil is available in the pyxis when using this medication. If oversedation occurs, administer 0.2 mg IV over 15 sec. If after 45 sec no response, administer 0.2 mg again over 1 min; may repeat at 1 min intervals; not to exceed 4 doses (1 mg)   triamcinolone acetonide (KENALOG-40) injection 40 mg   ropivacaine (PF) 2 mg/mL (0.2%) (NAROPIN) injection 9 mL    Medications administered: We administered lidocaine, lactated ringers, midazolam, triamcinolone acetonide, and ropivacaine (PF) 2 mg/mL (0.2%).  See the medical record for exact dosing, route, and time of administration.  Follow-up plan:   Return in about 6 weeks (around 04/11/2021) for Proc-day(T,Th), (VV), (PPE).       Interventional Therapies  Risk  Complexity Considerations:   NOTE: Eliquis Anticoagulation (Stop:3 days  Restart: 6 hrs)  Advanced age  Latex allergy   Planned  Pending:   Palliative right lumbar facet RFA #3    Under consideration:   Palliative interventions    Completed:  Palliative/therapeutic caudal ESI x1 (07/21/2019) (100/100/90/90)  Palliative right lumbar facet (L2, L3, L4, L5, & S1) MBB x9 (09/07/2020) (100/100/90/75)  Palliative/Therapeutic right lumbar facet RFA x3 (01/28/21) (2nd: 100/100/100/90)  Palliative right sacroiliac joint block x4 (01/24/2021) (100/100/0/0)  Palliative/therapeutic right-sided SI joint RFA x1 (03/27/2018) (100/90/0)    Therapeutic  Palliative (PRN) options:   Palliative/therapeutic caudal ESI #2  Palliative right lumbar facet block #10  Palliative/Therapeutic right lumbar facet RFA #3     Recent Visits Date Type Provider Dept  02/07/21 Telemedicine Milinda Pointer, MD [facility]  01/24/21 Procedure visit Milinda Pointer, MD [facility]  01/12/21 Office Visit Milinda Pointer, MD [facility]  Showing recent visits within past 90 days and meeting all other requirements Today's Visits Date Type Provider Dept  02/28/21 Procedure visit Milinda Pointer, MD [facility]  Showing today's visits and meeting all other requirements Future Appointments Date Type Provider Dept  04/11/21 Appointment Milinda Pointer, MD [facility]  Showing future appointments within next 90 days and meeting all other requirements Disposition: Discharge home  Discharge (Date  Time): 02/28/2021; 1133 hrs.   Primary Care Physician: [name], MD Location: [facility] Note by: Gaspar Cola, MD Date: 02/28/2021; Time: 12:29 PM  Disclaimer:  Medicine is not an Chief Strategy Officer. The only guarantee in medicine is that nothing is guaranteed. It is important to note that the decision to proceed with this intervention was based on the information collected from the patient. The Data and conclusions were drawn from the patient's questionnaire, the interview, and the physical examination. Because the information was provided in large part by the patient, it cannot be guaranteed that it has not been purposely or unconsciously manipulated. Every effort has been made to obtain as much relevant data as possible for this evaluation. It is important to note that the conclusions that lead to this procedure are derived in large part from the available data. Always take into account that the treatment will also be dependent on availability of resources and existing treatment guidelines, considered by other Pain Management Practitioners as being common knowledge and practice, at the time of the intervention. For Medico-Legal purposes, it is also important to point out that variation in procedural  techniques and pharmacological choices are the acceptable norm. The indications, contraindications, technique, and results of the above procedure should only be interpreted and judged by a Board-Certified Interventional Pain Specialist with extensive familiarity and expertise in the same exact procedure and technique.

## 2021-02-28 ENCOUNTER — Ambulatory Visit
Admission: RE | Admit: 2021-02-28 | Discharge: 2021-02-28 | Disposition: A | Discharge status: Home / Self Care | Payer: Medicare Other | Source: Ambulatory Visit | Attending: Pain Medicine | Admitting: Pain Medicine

## 2021-02-28 ENCOUNTER — Other Ambulatory Visit: Payer: Self-pay

## 2021-02-28 ENCOUNTER — Ambulatory Visit (HOSPITAL_BASED_OUTPATIENT_CLINIC_OR_DEPARTMENT_OTHER): Payer: Medicare Other | Admitting: Pain Medicine

## 2021-02-28 VITALS — BP 100/56 | HR 72 | Temp 97.0°F | Resp 17 | Ht <= 58 in | Wt 112.0 lb

## 2021-02-28 DIAGNOSIS — Z91040 Latex allergy status: Secondary | ICD-10-CM

## 2021-02-28 DIAGNOSIS — M47817 Spondylosis without myelopathy or radiculopathy, lumbosacral region: Secondary | ICD-10-CM | POA: Diagnosis not present

## 2021-02-28 DIAGNOSIS — Z5189 Encounter for other specified aftercare: Secondary | ICD-10-CM

## 2021-02-28 DIAGNOSIS — M47816 Spondylosis without myelopathy or radiculopathy, lumbar region: Secondary | ICD-10-CM

## 2021-02-28 DIAGNOSIS — M51369 Other intervertebral disc degeneration, lumbar region without mention of lumbar back pain or lower extremity pain: Secondary | ICD-10-CM

## 2021-02-28 DIAGNOSIS — M4310 Spondylolisthesis, site unspecified: Secondary | ICD-10-CM

## 2021-02-28 DIAGNOSIS — M545 Low back pain, unspecified: Secondary | ICD-10-CM | POA: Diagnosis not present

## 2021-02-28 DIAGNOSIS — Z7901 Long term (current) use of anticoagulants: Secondary | ICD-10-CM | POA: Diagnosis not present

## 2021-02-28 DIAGNOSIS — G8929 Other chronic pain: Secondary | ICD-10-CM | POA: Diagnosis not present

## 2021-02-28 DIAGNOSIS — M5136 Other intervertebral disc degeneration, lumbar region: Secondary | ICD-10-CM | POA: Insufficient documentation

## 2021-02-28 MED ORDER — LIDOCAINE HCL (PF) 2 % IJ SOLN
INTRAMUSCULAR | Status: AC
  Filled 2021-02-28: qty 20

## 2021-02-28 MED ORDER — MIDAZOLAM HCL 5 MG/5ML IJ SOLN
0.5000 mg | Freq: Once | INTRAMUSCULAR | Status: AC
  Administered 2021-02-28: 1 mg via INTRAVENOUS
  Filled 2021-02-28: qty 5

## 2021-02-28 MED ORDER — LACTATED RINGERS IV SOLN
1000.0000 mL | Freq: Once | INTRAVENOUS | Status: AC
  Administered 2021-02-28: 1000 mL via INTRAVENOUS

## 2021-02-28 MED ORDER — ROPIVACAINE HCL 2 MG/ML IJ SOLN
INTRAMUSCULAR | Status: AC
  Filled 2021-02-28: qty 20

## 2021-02-28 MED ORDER — LIDOCAINE HCL 2 % IJ SOLN
20.0000 mL | Freq: Once | INTRAMUSCULAR | Status: AC
  Administered 2021-02-28: 400 mg

## 2021-02-28 MED ORDER — ROPIVACAINE HCL 2 MG/ML IJ SOLN
9.0000 mL | Freq: Once | INTRAMUSCULAR | Status: AC
  Administered 2021-02-28: 9 mL via PERINEURAL

## 2021-02-28 MED ORDER — TRIAMCINOLONE ACETONIDE 40 MG/ML IJ SUSP
40.0000 mg | Freq: Once | INTRAMUSCULAR | Status: AC
Start: 2021-02-28 — End: 2021-02-28
  Administered 2021-02-28: 40 mg
  Filled 2021-02-28: qty 1

## 2021-02-28 NOTE — Patient Instructions (Signed)

## 2021-02-28 NOTE — Progress Notes (Signed)
Safety precautions to be maintained throughout the outpatient stay will include: orient to surroundings, keep bed in low position, maintain call bell within reach at all times, provide assistance with transfer out of bed and ambulation.  

## 2021-03-01 ENCOUNTER — Telehealth: Payer: Self-pay

## 2021-03-01 NOTE — Telephone Encounter (Signed)
Post procedure phone call. Patient states she is doing good.  

## 2021-03-02 ENCOUNTER — Telehealth: Payer: Self-pay | Admitting: Family Medicine

## 2021-03-02 NOTE — Telephone Encounter (Signed)
Patient is returning your call with questions about an appointment that was made for her.Please call her at [phone].

## 2021-03-07 NOTE — Telephone Encounter (Signed)
I'm sorry it was the Endocrinologist, the provider left the practice and they assigned her a new provider in November.  Zaylynn Rickett,cma

## 2021-03-07 NOTE — Telephone Encounter (Signed)
Can you find out what medical issue this is related to?

## 2021-03-07 NOTE — Telephone Encounter (Signed)
Noted  

## 2021-03-07 NOTE — Telephone Encounter (Signed)
Patient called and stated that the provider she was suppose to have seen has left the practice and now she is scheduled for November to see someone else, just a [place].  Amanda Soto,cma

## 2021-03-08 ENCOUNTER — Encounter: Payer: Medicare Other | Admitting: Pain Medicine

## 2021-03-09 ENCOUNTER — Ambulatory Visit (INDEPENDENT_AMBULATORY_CARE_PROVIDER_SITE_OTHER): Payer: Medicare Other | Admitting: Pharmacist

## 2021-03-09 DIAGNOSIS — M810 Age-related osteoporosis without current pathological fracture: Secondary | ICD-10-CM

## 2021-03-09 DIAGNOSIS — I5022 Chronic systolic (congestive) heart failure: Secondary | ICD-10-CM

## 2021-03-09 DIAGNOSIS — I4891 Unspecified atrial fibrillation: Secondary | ICD-10-CM

## 2021-03-09 DIAGNOSIS — E039 Hypothyroidism, unspecified: Secondary | ICD-10-CM

## 2021-03-09 NOTE — Patient Instructions (Signed)
Dillyn,   It was great to talk with you today!  We recommend you get your yearly influenza vaccine for this season. We recommend you pursue the updated bivalent COVID-19 booster, at least 2 months after any prior doses.   You can also talk with your local pharmacy about the Shingrix (new shingles) vaccine and the tetanus booster.   Let me know if you have any questions or concerns!  [name], PharmD [phone]  Visit Information  PATIENT GOALS:  Goals Addressed               This Visit's Progress     Patient Stated     Medication Adherence (pt-stated)        Patient Goals/Self-Care Activities Over the next 90 days, patient will:  - take medications as prescribed         The patient verbalized understanding of instructions, educational materials, and care plan provided today and agreed to receive a mailed copy of patient instructions, educational materials, and care plan.   Plan: Telephone follow up appointment with care management team member scheduled for:  ~ 3 months  [name], PharmD, [place], [place] Clinical Pharmacist [employer] [facility] [employer] [phone]

## 2021-03-09 NOTE — Chronic Care Management (AMB) (Signed)
Chronic Care Management Pharmacy Note  03/09/2021 Name:  Amanda Soto MRN:  [id number] DOB:  03-07-1925    Subjective: Amanda Soto is an 87 y.o. year old female who is a primary patient of Sonnenberg, Angela Adam, MD.  The CCM team was consulted for assistance with disease management and care coordination needs.    Engaged with patient by telephone for follow up visit in response to provider referral for pharmacy case management and/or care coordination services.   Consent to Services:  The patient was given information about Chronic Care Management services, agreed to services, and gave verbal consent prior to initiation of services.  Please see initial visit note for detailed documentation.   Patient Care Team: [name], MD as PCP - General (Family Medicine) Yolonda Kida, MD as Consulting Physician (Cardiology) De Hollingshead, RPH-CPP (Pharmacist)    Objective:  Lab Results  Component Value Date   CREATININE 0.52 05/11/2020   CREATININE 0.46 (L) 04/26/2020   CREATININE 0.45 04/26/2020    Lab Results  Component Value Date   HGBA1C 5.9 07/12/2017   Last diabetic Eye exam: No results found for: HMDIABEYEEXA  Last diabetic Foot exam: No results found for: HMDIABFOOTEX   No results found for: CHOL, TRIG, HDL, CHOLHDL, VLDL, LDLCALC, LDLDIRECT  Hepatic Function Latest Ref Rng & Units 04/26/2020 04/26/2020 12/18/2019  Total Protein 6.1 - 8.1 g/dL 7.3 7.3 7.6  Albumin 3.5 - 5.2 g/dL - 4.2 4.2  AST 0 - 37 U/L - 19 22  ALT 0 - 35 U/L - 9 15  Alk Phosphatase 39 - 117 U/L - 55 56  Total Bilirubin 0.2 - 1.2 mg/dL - 1.2 1.2  Bilirubin, Direct 0.0 - 0.2 mg/dL - - -    Lab Results  Component Value Date/Time   TSH 1.08 11/16/2020 11:33 AM   TSH 1.81 04/26/2020 01:54 PM    CBC Latest Ref Rng & Units 05/11/2020 05/11/2020 04/26/2020  WBC 4.0 - 10.5 K/uL 7.4 7.4 10.7(H)  Hemoglobin 12.0 - 15.0 g/dL 13.1 - 13.6  Hematocrit 36.0 - 46.0 % 39.8 - 41.4   Platelets 150.0 - 400.0 K/uL 240.0 - 288.0    Lab Results  Component Value Date/Time   VD25OH 28.73 (L) 01/11/2020 10:21 AM   VD25OH 29.45 (L) 10/08/2019 11:16 AM    Clinical ASCVD: No  The ASCVD Risk score (Arnett DK, et al., 2019) failed to calculate for the following reasons:   The 2019 ASCVD risk score is only valid for ages 79 to 86     CHA2DS2-VASc Score = 4  This indicates a 4.8% annual risk of stroke. The patient's score is based upon: CHF History: 1 HTN History: 0 Diabetes History: 0 Stroke History: 0 Vascular Disease History: 0 Age Score: 2 Gender Score: 1     Social History   Tobacco Use  Smoking Status Never  Smokeless Tobacco Never   BP Readings from Last 3 Encounters:  02/28/21 (!) 100/56  02/17/21 125/60  01/24/21 131/82   Pulse Readings from Last 3 Encounters:  02/28/21 72  02/17/21 (!) 50  01/24/21 95   Wt Readings from Last 3 Encounters:  02/28/21 112 lb (50.8 kg)  02/17/21 111 lb 3.2 oz (50.4 kg)  01/24/21 110 lb (49.9 kg)    Assessment: Review of patient past medical history, allergies, medications, health status, including review of consultants reports, laboratory and other test data, was performed as part of comprehensive evaluation and provision of chronic care  management services.   SDOH:  (Social Determinants of Health) assessments and interventions performed:  SDOH Interventions    Flowsheet Row Most Recent Value  SDOH Interventions   Financial Strain Interventions Intervention Not Indicated       CCM Care Plan  Allergies  Allergen Reactions   Hydrocodone Other (See Comments)    "Funny feeling"   Ciprofloxacin Nausea And Vomiting   Latex Rash    Medications Reviewed Today     Reviewed by De Hollingshead, RPH-CPP (Pharmacist) on 03/09/21 at 1314  Med List Status: <None>   Medication Order Taking? Sig Documenting Provider Last Dose Status Informant  acetaminophen (TYLENOL) 325 MG tablet [id number] Yes Take 500 mg  by mouth every 6 (six) hours as needed for moderate pain, fever or headache. [provider] Taking Active   apixaban (ELIQUIS) 2.5 MG TABS tablet [id number] Yes Take 1 tablet (2.5 mg total) by mouth 2 (two) times daily. [name], MD Taking Active            Med Note Darnelle Maffucci, Maralyn Sago Mar 09, 2021  1:05 PM)    Calcium Citrate (CITRACAL PO) [id number] Yes Take 1 tablet by mouth daily. [provider] Taking Active   Cholecalciferol (VITAMIN D3) 50 MCG (2000 UT) CHEW [id number] Yes Chew 1 tablet by mouth daily. [provider] Taking Active   diltiazem (CARDIZEM CD) 180 MG 24 hr capsule [id number] Yes Take 180 mg by mouth daily. [provider] Taking Active   fluticasone (FLONASE) 50 MCG/ACT nasal spray [id number] Yes SPRAY 2 SPRAYS INTO EACH NOSTRIL EVERY DAY [provider] Taking Active   furosemide (LASIX) 20 MG tablet [id number] No TAKE 1 TABLET BY MOUTH EVERY DAY  Patient not taking: Reported on 03/09/2021   [name], MD Not Taking Active            Med Note Nat Christen Mar 09, 2021  1:09 PM)    Multiple Vitamins-Minerals (PRESERVISION AREDS 2 PO) [id number] Yes Take by mouth in the morning and at bedtime. [provider] Taking Active   omeprazole (PRILOSEC) 40 MG capsule [id number] No TAKE 1 CAPSULE BY MOUTH EVERY DAY 15-20 MINUTES BEFORE MEALS  Patient not taking: Reported on 03/09/2021   [provider] Not Taking Active   SYNTHROID 50 MCG tablet [id number] Yes TAKE 1 TABLET BY MOUTH EVERY DAY BEFORE BREAKFAST [name], MD Taking Active   traMADol (ULTRAM) 50 MG tablet [id number] Yes Take 2 tablets (100 mg total) by mouth every 6 (six) hours. Each refill must last 30 days Milinda Pointer, MD Taking Active            Med Note Nat Christen Mar 09, 2021  1:14 PM) 100 mg 2-3 times daily  vitamin B-12 (CYANOCOBALAMIN) 1000 MCG tablet [id number] Yes Take by mouth. [provider] Taking Active   Med List Note Hart Rochester, RN 02/21/21 1238): 02/21/2021 Per patient request, fax sent to Dr. Clayborn Bigness regarding her upcoming procedure.  [facility] UDS 10/24/2020              Patient Active Problem List   Diagnosis Date Noted   Right hip pain 11/16/2020   Pubic bone pain 11/16/2020   Chronic use of opiate for therapeutic purpose 10/24/2020   Postnasal drip 07/28/2020   Vertebral fracture, osteoporotic, sequela 70/35/0093   Uncomplicated opioid dependence ([place]) 06/20/2020   Latex precautions, history of latex  allergy 02/04/2020   Heart murmur 01/27/2020   Elevated hemoglobin (HCC) 01/11/2020   Proteinuria 01/11/2020   Night sweats 12/18/2019   Poor vision 09/30/2019   Pincer nail deformity 07/06/2019   Traumatic closed fracture of sacrum (S3), sequela 06/03/2019   Paronychia of second toe, left 05/04/2019   Pathological fracture of sacral vertebra due to secondary osteoporosis ([place]) 03/30/2019   Coccygodynia 03/23/2019   Traumatic fracture of coccyx, sequela 03/23/2019   Rash 01/20/2019   Disorder of skeletal system 01/07/2019   Problems influencing health status 01/07/2019   Neurogenic pain 01/07/2019   Chronic hip pain (Left) 12/10/2018   Chronic elbow pain (Right) 12/10/2018   Allergic rhinitis 06/10/2018   Headache 04/10/2018   Spondylosis without myelopathy or radiculopathy, lumbosacral region 01/09/2018   Other specified dorsopathies, sacral and sacrococcygeal region 01/09/2018   History of allergy to latex 01/09/2018   Actinic keratoses 06/04/2017   Chronic anticoagulation (Eliquis) 04/24/2017   DDD (degenerative disc disease), lumbar 04/09/2017   Chronic sacroiliac joint pain (Right) 04/04/2017   History of fall 02/11/2017   Chronic fatigue 07/25/2016   Chronic pain syndrome 05/29/2016   Macular degeneration 01/20/2016   Lumbar spondylosis 11/15/2015   Scoliosis of lumbar spine (concave to right side) 11/15/2015   Lumbar  facet arthropathy 11/15/2015   Osteoarthritis of hip (Right) 11/15/2015   Thoracic paracentral T7-8 disc protrusion 11/15/2015   Cardiac murmur 11/14/2015   Hyperlipidemia 11/14/2015   MI (mitral incompetence) 11/14/2015   OP (osteoporosis) 11/14/2015   Chronic low back pain (1ry area of Pain) (Right) 11/14/2015   Lumbar facet syndrome (Right) 11/14/2015   Grade 1 Anterolisthesis of L4 over L5 (5 mm) 11/14/2015   Hypothyroidism 11/07/2015   Lightheadedness 11/07/2015   Elevated glucose 09/07/2015   Exertional shortness of breath 08/25/2015   Pharmacologic therapy 05/23/2015   Health care maintenance 85/46/2703   Chronic systolic heart failure ([place]) 11/09/2014   Bradycardia 11/09/2014   Atrial fibrillation ([place]) 11/09/2014   Hypertension 11/09/2014   Depression, major, recurrent, in partial remission ([place]) 01/16/2014   Degeneration of intervertebral disc of lumbar region 11/18/2013   H/O neoplasm 10/21/2012   History of nonmelanoma skin cancer 10/21/2012    Immunization History  Administered Date(s) Administered   Fluad Quad(high Dose 65+) 04/26/2020   Influenza Split 04/22/2014   Influenza, High Dose Seasonal PF 04/25/2016, 05/07/2017, 03/02/2018, 04/10/2019   Influenza-Unspecified 05/02/2015, 04/10/2019   PFIZER(Purple Top)SARS-COV-2 Vaccination 08/06/2019, 08/27/2019, 04/11/2020    Conditions to be addressed/monitored: Osteoporosis, Atrial Fibrillation  Care Plan : Medication Management  Updates made by De Hollingshead, RPH-CPP since 03/09/2021 12:00 AM     Problem: Osteoporosis, Atrial Fibrillation      Long-Range Goal: Disease Progression   Start Date: 12/14/2020  This Visit's Progress: On track  Recent Progress: On track  Priority: High  Note:   Current Barriers:  Suboptimal therapeutic regimen for osteoporosis  Pharmacist Clinical Goal(s):  Over the next 90 days, patient will achieve adherence to monitoring guidelines and medication adherence to achieve  therapeutic efficacy through collaboration with PharmD and provider.    Interventions: 1:1 collaboration with [name], MD regarding development and update of comprehensive plan of care as evidenced by provider attestation and co-signature Inter-disciplinary care team collaboration (see longitudinal plan of care) Comprehensive medication review performed; medication list updated in electronic medical record  SDOH: Lives alone, but no longer drives. Has two local sons. Has a cat.   Health Maintenance Yearly influenza vaccination: due Td/Tdap vaccination: due -  discussed, recommended she pursue Td booster at local pharmacy, Tdap if she has never received Pneumonia vaccination: up to date COVID vaccinations: due - recommended bivalent booster Shingrix vaccinations: due - discussed, recommended she pursue at her local pharmacy  Osteoporosis: Current treatment: none Prior treatment: Evenity x 2 doses, but patient reported arthralgias, fatigue persistent after 2 doses.  Last DEXA: 10/2019, t score at R forearm - 5.3 eGFR ~83 mL/min Supplementation: Vitamin D 2000 units daily, recently started calcium citrate 600 mg daily  Upcoming appointment in November w/ endocrinology. Previously discussed consideration for anabolic therapy given severe t-score on last DEXA and noted hx fractures. Did not tolerate Evenity. Forteo should be covered under Part D benefit that would allow her Medicare Extra Help to apply (copays ~ $3.95/30 or 90 day of generic medications or $9.85/30 or 90 day for brand medications). Unfortunately, patient is not willing to give herself a daily injection (although the medication comes in a pre-filled autoinjector).  Praised for addition of calcium citrate to daily routine. Continue vitamin D as well.  Atrial Fibrillation: Controlled; current rate/rhythm control: diltiazem 180 mg daily; anticoagulant treatment: Eliquis 2.5 mg BID (dose appropriate given age >77 AND  weight <60 kg, though Scr <1.5); follows w/ Dr. Clayborn Bigness; furosemide 20 mg daily PRN swelling - though little use recently Home blood pressure, heart rate readings: not checking. Last reading last week at Pain Management was 100/56, HR 72 Does report 2 times that she felt like she was going to fall- however, this happened after radiofrequency ablation procedure. Does report that she feels less tired after switching from sotolol to diltiazem  Recommended to continue current regimen at this time along with cardiology collaboration  Hypothyroidism: Controlled per last lab work; current regimen: levothyroxine 50 mcg daily Confirmed appropriate administration Recommended to continue current regimen at this time  GERD: Controlled per patient report; current regimen: omeprazole 40 mg daily - though reports she is using PRN, and not using often Recommended to continue current regimen at this time.   Chronic Pain: Recently worsened; Managed per Pain Management; current regimen: tramaol 100 mg up to QID, though patient notes she generally only needs 2-3 doses daily; instructed to also take acetaminophen 500 mg with tramadol, though she reports she does not often do this. S/p radiofrequency ablation last week; Follows w/ Dr. Consuela Mimes Encouraged to use acetaminophen as recommended by Dr. Consuela Mimes. Discussed max daily dose. Recommended to continue current regimen at this time   Supplements: Vitamin B12, AREDS multivitamin   Patient Goals/Self-Care Activities Over the next 90 days, patient will:  - take medications as prescribed  Follow Up Plan: Telephone follow up appointment with care management team member scheduled for: ~ 3 months      Medication Assistance: None required.  Patient affirms current coverage meets needs.  Patient's preferred pharmacy is:  [id number] [place] [id number]: [phone]:  [phone]  Follow Up:  Patient agrees to Care Plan and Follow-up.  Plan: Telephone follow up appointment with care management team member scheduled for:  ~ 3 months  Catie TDarnelle Maffucci PharmD, [place]awthorneClinical Pharmacist [facility] [phone]

## 2021-03-30 ENCOUNTER — Ambulatory Visit: Payer: Medicare Other | Admitting: Podiatry

## 2021-03-30 DIAGNOSIS — H353132 Nonexudative age-related macular degeneration, bilateral, intermediate dry stage: Secondary | ICD-10-CM | POA: Diagnosis not present

## 2021-03-31 DIAGNOSIS — I5022 Chronic systolic (congestive) heart failure: Secondary | ICD-10-CM | POA: Diagnosis not present

## 2021-03-31 DIAGNOSIS — M810 Age-related osteoporosis without current pathological fracture: Secondary | ICD-10-CM

## 2021-03-31 DIAGNOSIS — E039 Hypothyroidism, unspecified: Secondary | ICD-10-CM | POA: Diagnosis not present

## 2021-03-31 DIAGNOSIS — I4891 Unspecified atrial fibrillation: Secondary | ICD-10-CM

## 2021-04-04 NOTE — Telephone Encounter (Signed)
This was handled by provider.  Amanda Soto,cma

## 2021-04-06 ENCOUNTER — Encounter: Payer: Self-pay | Admitting: Podiatry

## 2021-04-06 ENCOUNTER — Ambulatory Visit: Payer: Medicare Other | Admitting: Podiatry

## 2021-04-06 ENCOUNTER — Other Ambulatory Visit: Payer: Self-pay

## 2021-04-06 DIAGNOSIS — M79609 Pain in unspecified limb: Secondary | ICD-10-CM | POA: Diagnosis not present

## 2021-04-06 DIAGNOSIS — B351 Tinea unguium: Secondary | ICD-10-CM | POA: Diagnosis not present

## 2021-04-06 DIAGNOSIS — L608 Other nail disorders: Secondary | ICD-10-CM

## 2021-04-06 DIAGNOSIS — D689 Coagulation defect, unspecified: Secondary | ICD-10-CM | POA: Diagnosis not present

## 2021-04-06 NOTE — Progress Notes (Signed)
This patient returns to my office for at risk foot care.  This patient requires this care by a professional since this patient will be at risk due to having  Coagulation defect.  Patient is taking eliquiss.  This patient is unable to cut nails herself since the patient cannot reach her  nails.These nails are painful walking and wearing shoes.  This patient presents for at risk foot care today.  General Appearance  Alert, conversant and in no acute stress.  Vascular  Dorsalis pedis and posterior tibial  pulses are palpable  bilaterally.  Capillary return is within normal limits  bilaterally. Temperature is within normal limits  bilaterally.  Neurologic  Senn-Weinstein monofilament wire test within normal limits  bilaterally. Muscle power within normal limits bilaterally.  Nails Thick disfigured discolored nails with subungual debris  from hallux to fifth toes bilaterally. No evidence of bacterial infection or drainage bilaterally. Pincer nails.  Orthopedic  No limitations of motion  feet .  No crepitus or effusions noted.  No bony pathology or digital deformities noted.Hammer toes second.  Skin  normotropic skin with no porokeratosis noted bilaterally.  No signs of infections or ulcers noted.     Onychomycosis  Pain in right toes  Pain in left toes  Consent was obtained for treatment procedures.   Mechanical debridement of nails 1-5  bilaterally performed with a nail nipper.  Filed with dremel without incident. No infection or ulcer.     Return office visit 3 months         Told patient to return for periodic foot care and evaluation due to potential at risk complications.   Gardiner Barefoot DPM

## 2021-04-10 NOTE — Progress Notes (Signed)
Patient: Amanda Soto  Service Category: E/M  Provider: Gaspar Cola, MD  DOB: 04-01-1925  DOS: 04/11/2021  Location: Office  MRN: [id number]  Setting: Ambulatory outpatient  Referring Provider: [name], MD  Type: Established Patient  Specialty: Interventional Pain Management  PCP: [name], MD  Location: Remote location  Delivery: TeleHealth     Virtual Encounter - Pain Management PROVIDER NOTE: Information contained herein reflects review and annotations entered in association with encounter. Interpretation of such information and data should be left to medically-trained personnel. Information provided to patient can be located elsewhere in the medical record under "Patient Instructions". Document created using STT-dictation technology, any transcriptional errors that may result from process are unintentional.    Contact & Pharmacy Preferred: [place]: [phone] (home) Mobile: [phone] (mobile) E-mail: [name]  [id number] [place] [id number]: [phone]: [phone]  Pre-screening  Ms. Sievert offered "in-person" vs "virtual" encounter. She indicated preferring virtual for this encounter.   Reason COVID-19*  Social distancing based on CDC and AMA recommendations.   I contacted PLATRISH MOGELon 04/11/2021 via telephone.      I clearly identified myself as FGaspar Cola MD. I verified that I was speaking with the correct person using two identifiers (Name: PJULIONA VALES and date of birth: 85/12/1924.  Consent I sought verbal advanced consent from PKatheren Pullerfor virtual visit interactions. I informed Ms. Weedon of possible security and privacy concerns, risks, and limitations associated with providing "not-in-person" medical evaluation and management services. I also informed Ms. Achenbach of the availability of "in-person" appointments. Finally, I  informed her that there would be a charge for the virtual visit and that she could be  personally, fully or partially, financially responsible for it. Ms. HPricerexpressed understanding and agreed to proceed.   Historic Elements   Ms. PEDA MAGNUSSENis a 87y.o. year old, female patient evaluated today after our last contact on 02/28/2021. Ms. HWindsor has a past medical history of Acute postoperative pain (12/17/2016), Anemia (11/10/2015), Arthritis, Atrial fibrillation ([place], CHF (congestive heart failure) ([place], Chickenpox, Chronic abdominal pain (01/16/2014), Closed Colles' fracture (12/17/2016), Degenerative arthritis of hip (08/19/2014), Degenerative arthritis of lumbar spine (11/18/2013), Depression, Diverticulitis, GERD (gastroesophageal reflux disease), Heart murmur, Heart rate slow, Hyperlipidemia, Hypertension, Hypothyroid, Neuritis or radiculitis due to rupture of lumbar intervertebral disc (11/18/2013), Skin cancer (2016 ?), Symptomatic anemia (11/09/2015), and Trochanteric bursitis of right hip (11/18/2013). She also  has a past surgical history that includes Cataract extraction; Partial hysterectomy; Appendectomy; and Tonsillectomy. Ms. HFuncheshas a current medication list which includes the following prescription(s): acetaminophen, apixaban, calcium citrate, vitamin d3, diltiazem, fluticasone, multiple vitamins-minerals, synthroid, tramadol, vitamin b-12, furosemide, and omeprazole. She  reports that she has never smoked. She has never used smokeless tobacco. She reports current alcohol use of about 1.0 standard drink per week. She reports that she does not use drugs. Ms. HBogganis allergic to hydrocodone, ciprofloxacin, and latex.   HPI  Today, she is being contacted for a post-procedure assessment.  Post-Procedure Evaluation  Procedure (02/28/2021):  Procedure:           Anesthesia, Analgesia, Anxiolysis:  Type: Thermal Lumbar Facet, Medial Branch Radiofrequency Ablation/Neurotomy           Primary  Purpose: Therapeutic Region: Posterolateral Lumbosacral Spine Level: L2, L3, L4, L5, & S1 Medial Branch Level(s). These levels will denervate the L3-4,  L4-5, and the L5-S1 lumbar facet joints. Laterality: Right   Type: Minimal Anxiolysis combined with Local Anesthesia Indication(s): Analgesia and Anxiety Route: Intravenous (IV) IV Access: Secured Sedation: Meaningful verbal contact was maintained at all times during the procedure  Local Anesthetic: Lidocaine 1-2%   Position: Prone    Indications: 1. Lumbar facet syndrome (Right)   2. Spondylosis without myelopathy or radiculopathy, lumbosacral region   3. Grade 1 Anterolisthesis of L4 over L5 (5 mm)   4. DDD (degenerative disc disease), lumbar   5. Lumbar facet arthropathy   6. Lumbar spondylosis   7. Chronic low back pain (1ry area of Pain) (Right)   8. Encounter for therapeutic procedure   9. Chronic anticoagulation (Eliquis)   10. History of allergy to latex   11. Latex precautions, history of latex allergy     Ms. Golay has been dealing with the above chronic pain for longer than three months and has either failed to respond, was unable to tolerate, or simply did not get enough benefit from other more conservative therapies including, but not limited to: 1. Over-the-counter medications 2. Anti-inflammatory medications 3. Muscle relaxants 4. Membrane stabilizers 5. Opioids 6. Physical therapy and/or chiropractic manipulation 7. Modalities (Heat, ice, etc.) 8. Invasive techniques such as nerve blocks. Ms. Canizales has attained more than 50% relief of the pain from a series of diagnostic injections conducted in separate occasions.   Pain Score: Pre-procedure: 5 /10 Post-procedure: 0-No pain/10    Anxiolysis: Please see nurses note.  Effectiveness during initial hour after procedure (Ultra-Short Term Relief): 100 %.  Local anesthetic used: Long-acting (4-6 hours) Effectiveness: Defined as any analgesic benefit obtained  secondary to the administration of local anesthetics. This carries significant diagnostic value as to the etiological location, or anatomical origin, of the pain. Duration of benefit is expected to coincide with the duration of the local anesthetic used.  Effectiveness during initial 4-6 hours after procedure (Short-Term Relief): 100 %.  Long-term benefit: Defined as any relief past the pharmacologic duration of the local anesthetics.  Effectiveness past the initial 6 hours after procedure (Long-Term Relief): 70 %.  Benefits, current: Defined as benefit present at the time of this evaluation.   Analgesia: The patient indicates that she is currently enjoying an 80% ongoing relief of her low back pain.  She did complain about having being released too early.  She explained that when she was taken down to the car, she felt some weakness in the leg and the same thing happen when she was at home and she ended up with the leg buckling under her and she scraped it.  She refers that she is doing much better and that weakness is completely gone which makes me think that it might of been secondary to the local anesthetic.  I apologize for that and I told her that we would need to keep a closer eye on her next time.  She was okay with that and indicated that she is currently doing much better. Function: Ms. Leske reports improvement in function ROM: Ms. Balzarini reports improvement in ROM   Pharmacotherapy Assessment   Analgesic: Tramadol 50 mg, 1 tablet PO q 6 hrs (200 mg/day of tramadol) MME/day: 20 mg/day.   Monitoring: [place] PMP: PDMP reviewed during this encounter.       Pharmacotherapy: No side-effects or adverse reactions reported. Compliance: No problems identified. Effectiveness: Clinically acceptable. Plan: Refer to "POC". UDS:  Summary  Date Value Ref Range Status  10/24/2020 Note  Final  Comment:    ==================================================================== Tox[employer] Select 13  (MW) ==================================================================== Test                             Result       Flag       Units  Drug Present and Declared for Prescription Verification   Tramadol                       >6667        EXPECTED   ng/mg creat   O-Desmethyltramadol            >6667        EXPECTED   ng/mg creat   N-Desmethyltramadol            >6667        EXPECTED   ng/mg creat    Source of tramadol is a prescription medication. O-desmethyltramadol    and N-desmethyltramadol are expected metabolites of tramadol.  ==================================================================== Test                      Result    Flag   Units      Ref Range   Creatinine              75               mg/dL      >=20 ==================================================================== Declared Medications:  The flagging and interpretation on this report are based on the  following declared medications.  Unexpected results may arise from  inaccuracies in the declared medications.   **Note: The testing scope of this panel includes these medications:   Tramadol   **Note: The testing scope of this panel does not include the  following reported medications:   Acetaminophen  Apixaban  Cyanocobalamin  Fluticasone  Furosemide  Levothyroxine  Multivitamin  Omeprazole  Sotalol  Vitamin D3 ==================================================================== For clinical consultation, please call [phone]. ====================================================================      Laboratory Chemistry Profile   Renal Lab Results  Component Value Date   BUN 14 05/11/2020   CREATININE 0.52 05/11/2020   LABCREA 90 01/11/2020   BCR 24 (H) 04/26/2020   GFR 80.19 05/11/2020   GFRAA >60 12/18/2019   GFRNONAA >60 12/18/2019    Hepatic Lab Results  Component Value Date   AST 19 04/26/2020   ALT 9 04/26/2020   ALBUMIN 4.2 04/26/2020   ALKPHOS 55 04/26/2020   LIPASE 181  11/02/2013    Electrolytes Lab Results  Component Value Date   NA 134 (L) 05/11/2020   K 4.0 05/11/2020   CL 97 05/11/2020   CALCIUM 9.3 05/11/2020   MG 2.0 03/30/2019   PHOS 3.1 04/26/2020    Bone Lab Results  Component Value Date   VD25OH 28.73 (L) 01/11/2020   25OHVITD1 14 (L) 03/30/2019   25OHVITD2 <1.0 03/30/2019   25OHVITD3 14 03/30/2019    Inflammation (CRP: Acute Phase) (ESR: Chronic Phase) Lab Results  Component Value Date   CRP <1 03/30/2019   ESRSEDRATE 50 (H) 03/30/2019         Note: Above Lab results reviewed.  Imaging  DG PAIN CLINIC C-ARM 1-60 MIN NO REPORT Fluoro was used, but no Radiologist interpretation will be provided.  Please refer to "NOTES" tab for provider progress note.  Assessment  The primary encounter diagnosis was Chronic low back pain (1ry area of Pain) (Right). Diagnoses of Lumbar  facet syndrome (Right), Grade 1 Anterolisthesis of L4 over L5 (5 mm), Chronic pain syndrome, and Encounter for chronic pain management were also pertinent to this visit.  Plan of Care  Problem-specific:  No problem-specific Assessment & Plan notes found for this encounter.  Ms. ARRION BURRUEL has a current medication list which includes the following long-term medication(s): apixaban, calcium citrate, diltiazem, fluticasone, synthroid, tramadol, and furosemide.  Pharmacotherapy (Medications Ordered): No orders of the defined types were placed in this encounter.  Orders:  No orders of the defined types were placed in this encounter.  Follow-up plan:   Return in about 4 months (around 08/06/2021) for Eval-day (M,W), (F2F), (MM).     Interventional Therapies  Risk  Complexity Considerations:   NOTE: Eliquis Anticoagulation (Stop:3 days  Restart: 6 hrs)  Advanced age  Latex allergy   Planned  Pending:   Palliative right lumbar facet RFA #3    Under consideration:   Palliative interventions    Completed:   Palliative/therapeutic caudal ESI x1  (07/21/2019) (100/100/90/90)  Palliative right lumbar facet (L2, L3, L4, L5, & S1) MBB x9 (09/07/2020) (100/100/90/75)  Palliative/Therapeutic right lumbar facet RFA x3 (01/28/21) (2nd: 100/100/100/90)  Palliative right sacroiliac joint block x4 (01/24/2021) (100/100/0/0)  Palliative/therapeutic right-sided SI joint RFA x1 (03/27/2018) (100/90/0)    Therapeutic  Palliative (PRN) options:   Palliative/therapeutic caudal ESI #2  Palliative right lumbar facet block #10  Palliative/Therapeutic right lumbar facet RFA #3      Recent Visits Date Type Provider Dept  02/28/21 Procedure visit Milinda Pointer, MD [facility]  02/07/21 Telemedicine Milinda Pointer, MD [facility]  01/24/21 Procedure visit Milinda Pointer, MD [facility]  01/12/21 Office Visit Milinda Pointer, MD [facility]  Showing recent visits within past 90 days and meeting all other requirements Today's Visits Date Type Provider Dept  04/11/21 Office Visit Milinda Pointer, MD [facility]  Showing today's visits and meeting all other requirements Future Appointments No visits were found meeting these conditions. Showing future appointments within next 90 days and meeting all other requirements I discussed the assessment and treatment plan with the patient. The patient was provided an opportunity to ask questions and all were answered. The patient agreed with the plan and demonstrated an understanding of the instructions.  Patient advised to call back or seek an in-person evaluation if the symptoms or condition worsens.  Duration of encounter: 15 minutes.  Note by: Gaspar Cola, MD Date: 04/11/2021; Time: 5:13 PM

## 2021-04-11 ENCOUNTER — Other Ambulatory Visit: Payer: Self-pay

## 2021-04-11 ENCOUNTER — Ambulatory Visit: Payer: Medicare Other | Attending: Pain Medicine | Admitting: Pain Medicine

## 2021-04-11 DIAGNOSIS — G8929 Other chronic pain: Secondary | ICD-10-CM

## 2021-04-11 DIAGNOSIS — M4310 Spondylolisthesis, site unspecified: Secondary | ICD-10-CM

## 2021-04-11 DIAGNOSIS — M47816 Spondylosis without myelopathy or radiculopathy, lumbar region: Secondary | ICD-10-CM

## 2021-04-11 DIAGNOSIS — G894 Chronic pain syndrome: Secondary | ICD-10-CM | POA: Diagnosis not present

## 2021-04-11 DIAGNOSIS — M545 Low back pain, unspecified: Secondary | ICD-10-CM

## 2021-04-12 ENCOUNTER — Encounter: Payer: Self-pay | Admitting: Family

## 2021-04-12 ENCOUNTER — Ambulatory Visit (INDEPENDENT_AMBULATORY_CARE_PROVIDER_SITE_OTHER): Payer: Medicare Other | Admitting: Family

## 2021-04-12 VITALS — Ht <= 58 in

## 2021-04-12 DIAGNOSIS — J0100 Acute maxillary sinusitis, unspecified: Secondary | ICD-10-CM | POA: Diagnosis not present

## 2021-04-12 MED ORDER — AMOXICILLIN-POT CLAVULANATE 875-125 MG PO TABS: 1.0000 | ORAL_TABLET | Freq: Two times a day (BID) | ORAL | 0 refills | Status: AC

## 2021-04-12 NOTE — Progress Notes (Signed)
Verbal consent for services obtained from patient prior to services given to TELEPHONE visit:   Location of call:  provider at work patient at home  Names of all persons present for services: Mable Paris, NP and patient Chief complaint:  Acute visit for nasal congestion, left-sided facial pressure, unchanged.  She also endorses sore throat, since resolved. Symptom started 1 week ago.  She is using Flonase, cough drops with some relief.  No longer on claritin. Home test negative for COVID, taken couple of days ago.  No antibiotic in 3 months. She has tolerated augmentin in the past 01/2020  No fever, facial swelling or redness, rash, oral pain,vision changes, HA, cp, sob, wheezing, cough.   History of atrial fibrillation, heart failure, hypothyroidism., chronic pain ( following with Dr Consuela Mimes)  No h/o CKD  A/P/next steps:  Problem List Items Addressed This Visit       Respiratory   Sinusitis - Primary    Afebrile.  No alarm features.  Based on duration of symptoms, I think is reasonable to treat for bacterial sinusitis.  Start Augmentin.  Counseled on the importance of probiotics while on antibiotic and after.  Encouraged water to loosen secretions.  Patient will call with any concerns.      Relevant Medications   amoxicillin-clavulanate (AUGMENTIN) 875-125 MG tablet     I spent 15 min  discussing plan of care over the phone.

## 2021-04-12 NOTE — Assessment & Plan Note (Signed)
Afebrile.  No alarm features.  Based on duration of symptoms, I think is reasonable to treat for bacterial sinusitis.  Start Augmentin.  Counseled on the importance of probiotics while on antibiotic and after.  Encouraged water to loosen secretions.  Patient will call with any concerns.

## 2021-04-12 NOTE — Patient Instructions (Signed)
Start augmentin  Ensure to take probiotics while on antibiotics and also for 2 weeks after completion. This can either be by eating yogurt daily or taking a probiotic supplement over the counter such as Culturelle.It is important to re-colonize the gut with good bacteria and also to prevent any diarrheal infections associated with antibiotic use.    Let us of any concerns

## 2021-05-17 DIAGNOSIS — I495 Sick sinus syndrome: Secondary | ICD-10-CM | POA: Diagnosis not present

## 2021-05-17 DIAGNOSIS — R5382 Chronic fatigue, unspecified: Secondary | ICD-10-CM | POA: Diagnosis not present

## 2021-05-17 DIAGNOSIS — R001 Bradycardia, unspecified: Secondary | ICD-10-CM | POA: Diagnosis not present

## 2021-05-17 DIAGNOSIS — R531 Weakness: Secondary | ICD-10-CM | POA: Diagnosis not present

## 2021-05-17 DIAGNOSIS — I10 Essential (primary) hypertension: Secondary | ICD-10-CM | POA: Diagnosis not present

## 2021-05-17 DIAGNOSIS — R42 Dizziness and giddiness: Secondary | ICD-10-CM | POA: Diagnosis not present

## 2021-05-17 DIAGNOSIS — R011 Cardiac murmur, unspecified: Secondary | ICD-10-CM | POA: Diagnosis not present

## 2021-05-17 DIAGNOSIS — R002 Palpitations: Secondary | ICD-10-CM | POA: Diagnosis not present

## 2021-05-17 DIAGNOSIS — I4891 Unspecified atrial fibrillation: Secondary | ICD-10-CM | POA: Diagnosis not present

## 2021-05-24 DIAGNOSIS — M810 Age-related osteoporosis without current pathological fracture: Secondary | ICD-10-CM | POA: Diagnosis not present

## 2021-06-01 ENCOUNTER — Ambulatory Visit (INDEPENDENT_AMBULATORY_CARE_PROVIDER_SITE_OTHER): Payer: Medicare Other | Admitting: Pharmacist

## 2021-06-01 DIAGNOSIS — I4891 Unspecified atrial fibrillation: Secondary | ICD-10-CM

## 2021-06-01 DIAGNOSIS — M810 Age-related osteoporosis without current pathological fracture: Secondary | ICD-10-CM

## 2021-06-01 DIAGNOSIS — I5022 Chronic systolic (congestive) heart failure: Secondary | ICD-10-CM

## 2021-06-01 NOTE — Patient Instructions (Signed)
Visit Information  Following are the goals we discussed today:    Patient Goals/Self-Care Activities Over the next 90 days, patient will:  - take medications as prescribed        Plan: Goals of care met. Closing CCM case   [name], PharmD, [place], CPP Clinical Pharmacist [place] [facility] [phone]   Please call the care guide team at [phone] if you need to cancel or reschedule your appointment.   The patient verbalized understanding of instructions, educational materials, and care plan provided today and declined offer to receive copy of patient instructions, educational materials, and care plan.

## 2021-06-01 NOTE — Chronic Care Management (AMB) (Signed)
Chronic Care Management CCM Pharmacy Note  06/01/2021 Name:  Amanda Soto MRN:  [id number] DOB:  10-15-1924  Summary: - Doing well. Collaborating with endocrinology to start treatment with Reclast  Recommendations/Changes made from today's visit: - Recommended to continue current regimen at this time. Closing CCM case   Subjective: Amanda Soto is an 87 y.o. year old female who is a primary patient of Sonnenberg, Angela Adam, MD.  The CCM team was consulted for assistance with disease management and care coordination needs.    Engaged with patient by telephone for follow up visit for pharmacy case management and/or care coordination services.   Objective:  Medications Reviewed Today     Reviewed by Burnard Hawthorne, FNP (Family Nurse Practitioner) on 04/12/21 at 1029  Med List Status: <None>   Medication Order Taking? Sig Documenting Provider Last Dose Status Informant  acetaminophen (TYLENOL) 325 MG tablet [id number] Yes Take 500 mg by mouth every 6 (six) hours as needed for moderate pain, fever or headache. [provider] Taking Active   apixaban (ELIQUIS) 2.5 MG TABS tablet [id number] Yes Take 1 tablet (2.5 mg total) by mouth 2 (two) times daily. [name], MD Taking Active            Med Note Darnelle Maffucci, Maralyn Sago Mar 09, 2021  1:05 PM)    Calcium Citrate (CITRACAL PO) [id number] Yes Take 1 tablet by mouth daily. [provider] Taking Active   Cholecalciferol (VITAMIN D3) 50 MCG (2000 UT) CHEW [id number] Yes Chew 1 tablet by mouth daily. [provider] Taking Active   diltiazem (CARDIZEM CD) 180 MG 24 hr capsule [id number] Yes Take 180 mg by mouth daily. [provider] Taking Active   fluticasone (FLONASE) 50 MCG/ACT nasal spray [id number] Yes SPRAY 2 SPRAYS INTO EACH NOSTRIL EVERY DAY [provider] Taking Active   furosemide (LASIX) 20 MG tablet [id number] Yes Take 20 mg by mouth as needed. [provider] Taking  Active   Multiple Vitamins-Minerals (PRESERVISION AREDS 2 PO) [id number] Yes Take by mouth in the morning and at bedtime. [provider] Taking Active   SYNTHROID 50 MCG tablet [id number] Yes TAKE 1 TABLET BY MOUTH EVERY DAY BEFORE BREAKFAST [name], MD Taking Active   traMADol (ULTRAM) 50 MG tablet [id number] Yes Take 2 tablets (100 mg total) by mouth every 6 (six) hours. Each refill must last 30 days Milinda Pointer, MD Taking Active            Med Note ([place] Apr 11, 2021  5:14 PM) WARNING: Not a Duplicate. Future prescription. DO NOT DELETE during hospital medication reconciliation or at discharge. [facility] Chronic Pain Management Patient   vitamin B-12 (CYANOCOBALAMIN) 1000 MCG tablet [id number] Yes Take by mouth. [provider] Taking Active   Med List Note Hart Rochester, RN 02/21/21 1238): 02/21/2021 Per patient request, fax sent to Dr. Clayborn Bigness regarding her upcoming procedure.  [facility] UDS 10/24/2020              Pertinent Labs:   Lab Results  Component Value Date   HGBA1C 5.9 07/12/2017   No results found for: CHOL, HDL, LDLCALC, LDLDIRECT, TRIG, CHOLHDL Lab Results  Component Value Date   CREATININE 0.52 05/11/2020   BUN 14 05/11/2020   NA 134 (L) 05/11/2020   K 4.0 05/11/2020   CL 97 05/11/2020   CO2 32 05/11/2020    SDOH:  (Social Determinants of  Health) assessments and interventions performed:  SDOH Interventions    Flowsheet Row Most Recent Value  SDOH Interventions   Financial Strain Interventions Intervention Not Indicated       CCM Care Plan  Review of patient past medical history, allergies, medications, health status, including review of consultants reports, laboratory and other test data, was performed as part of comprehensive evaluation and provision of chronic care management services.   Care Plan : Medication Management  Updates made by De Hollingshead, RPH-CPP since 06/01/2021 12:00 AM      Problem: Osteoporosis, Atrial Fibrillation      Long-Range Goal: Disease Progression   Start Date: 12/14/2020  Recent Progress: On track  Priority: High  Note:   Current Barriers:  Suboptimal therapeutic regimen for osteoporosis  Pharmacist Clinical Goal(s):  Over the next 90 days, patient will achieve adherence to monitoring guidelines and medication adherence to achieve therapeutic efficacy through collaboration with PharmD and provider.    Interventions: 1:1 collaboration with [name], MD regarding development and update of comprehensive plan of care as evidenced by provider attestation and co-signature Inter-disciplinary care team collaboration (see longitudinal plan of care) Comprehensive medication review performed; medication list updated in electronic medical record  SDOH: Lives alone, but no longer drives. Has two local sons. Has a cat.   Health Maintenance Yearly influenza vaccination: due, previously recommended Td/Tdap vaccination: due - previously discussed, recommended she pursue local pharmacy Pneumonia vaccination: due - previously recommended she discuss with PCP COVID vaccinations: due - previously recommended bivalent booster Shingrix vaccinations: due - previously discussed, recommended she pursue at her local pharmacy  Osteoporosis: Current treatment: endocrinology pursuing Reclast infusion Prior treatment: Evenity x 2 doses, but patient reported arthralgias, fatigue persistent after 2 doses.  Last DEXA: 10/2019, t score at R forearm - 5.3 eGFR ~83 mL/min Supplementation: Vitamin D 2000 units daily calcium citrate 600 mg daily  Discussed side effects, including risk of arthralgias and recommendation from endocrinology to pre treat with acetaminophen. Patient is waiting to hear about scheduling of infusion Recommended to continue current regimen at this time along with collaboration with endocrinology.   Atrial Fibrillation: Controlled; current  rate/rhythm control: amiodarone 200 mg (recently restarted, diltiazem discontinued); anticoagulant treatment: Eliquis 2.5 mg BID (dose appropriate given age >97 AND weight <60 kg, though Scr <1.5); follows w/ Dr. Clayborn Bigness; furosemide 20 mg daily PRN swelling - though little use recently No cost concerns with Eliquis given patient having Medicare Extra Help Home blood pressure, heart rate readings: not checking. Well controlled at recent visits Recommended to continue current regimen at this time along with cardiology collaboration  Hypothyroidism: Controlled per last lab work; current regimen: levothyroxine 50 mcg daily Recommended to continue current regimen at this time. Continue to monitor TSH with resumption of amiodarone therapy  GERD: Controlled per patient report; current regimen: omeprazole 40 mg daily - though reports she is using PRN, and not using often Previously recommended to continue current regimen at this time.   Chronic Pain: Recently worsened; Managed per Pain Management; current regimen: tramaol 100 mg up to QID, acetaminophen 500 mg with tramadol, Follows w/ Dr. Consuela Mimes Previously recommended to continue current regimen at this time   Supplements: Vitamin B12, AREDS multivitamin   Patient Goals/Self-Care Activities Over the next 90 days, patient will:  - take medications as prescribed      Plan: Goals of care met. Closing CCM case  [name], PharmD, [place], [place] Clinical Pharmacist [employer] at [employer] [phone]

## 2021-06-19 ENCOUNTER — Ambulatory Visit: Payer: Medicare Other | Admitting: Family Medicine

## 2021-06-22 ENCOUNTER — Other Ambulatory Visit: Payer: Self-pay

## 2021-06-22 ENCOUNTER — Encounter: Payer: Self-pay | Admitting: Podiatry

## 2021-06-22 ENCOUNTER — Ambulatory Visit: Payer: Medicare Other | Admitting: Podiatry

## 2021-06-22 DIAGNOSIS — B351 Tinea unguium: Secondary | ICD-10-CM

## 2021-06-22 DIAGNOSIS — M79609 Pain in unspecified limb: Secondary | ICD-10-CM

## 2021-06-22 DIAGNOSIS — L608 Other nail disorders: Secondary | ICD-10-CM

## 2021-06-22 DIAGNOSIS — D689 Coagulation defect, unspecified: Secondary | ICD-10-CM | POA: Diagnosis not present

## 2021-06-22 NOTE — Progress Notes (Signed)
This patient returns to my office for at risk foot care.  This patient requires this care by a professional since this patient will be at risk due to having  Coagulation defect.  Patient is taking eliquiss.  This patient is unable to cut nails herself since the patient cannot reach her  nails.These nails are painful walking and wearing shoes.  This patient presents for at risk foot care today.  General Appearance  Alert, conversant and in no acute stress.  Vascular  Dorsalis pedis and posterior tibial  pulses are palpable  bilaterally.  Capillary return is within normal limits  bilaterally. Temperature is within normal limits  bilaterally.  Neurologic  Senn-Weinstein monofilament wire test within normal limits  bilaterally. Muscle power within normal limits bilaterally.  Nails Thick disfigured discolored nails with subungual debris  from hallux to fifth toes bilaterally. No evidence of bacterial infection or drainage bilaterally. Pincer nails.  Orthopedic  No limitations of motion  feet .  No crepitus or effusions noted.  No bony pathology or digital deformities noted.Hammer toes second.  Skin  normotropic skin with no porokeratosis noted bilaterally.  No signs of infections or ulcers noted.     Onychomycosis  Pain in right toes  Pain in left toes  Consent was obtained for treatment procedures.   Mechanical debridement of nails 1-5  bilaterally performed with a nail nipper.  Filed with dremel without incident. No infection or ulcer.     Return office visit 3 months         Told patient to return for periodic foot care and evaluation due to potential at risk complications.   Gardiner Barefoot DPM

## 2021-06-28 ENCOUNTER — Ambulatory Visit (INDEPENDENT_AMBULATORY_CARE_PROVIDER_SITE_OTHER): Payer: Medicare Other

## 2021-06-28 ENCOUNTER — Encounter: Payer: Self-pay | Admitting: Internal Medicine

## 2021-06-28 ENCOUNTER — Ambulatory Visit (INDEPENDENT_AMBULATORY_CARE_PROVIDER_SITE_OTHER): Payer: Medicare Other | Admitting: Internal Medicine

## 2021-06-28 ENCOUNTER — Other Ambulatory Visit: Payer: Self-pay

## 2021-06-28 VITALS — BP 128/70 | HR 99 | Temp 97.8°F | Ht <= 58 in | Wt 106.4 lb

## 2021-06-28 DIAGNOSIS — S299XXA Unspecified injury of thorax, initial encounter: Secondary | ICD-10-CM | POA: Diagnosis not present

## 2021-06-28 DIAGNOSIS — R7303 Prediabetes: Secondary | ICD-10-CM

## 2021-06-28 DIAGNOSIS — M546 Pain in thoracic spine: Secondary | ICD-10-CM | POA: Diagnosis not present

## 2021-06-28 DIAGNOSIS — G8929 Other chronic pain: Secondary | ICD-10-CM | POA: Diagnosis not present

## 2021-06-28 DIAGNOSIS — I10 Essential (primary) hypertension: Secondary | ICD-10-CM | POA: Diagnosis not present

## 2021-06-28 DIAGNOSIS — E032 Hypothyroidism due to medicaments and other exogenous substances: Secondary | ICD-10-CM

## 2021-06-28 DIAGNOSIS — E039 Hypothyroidism, unspecified: Secondary | ICD-10-CM

## 2021-06-28 DIAGNOSIS — F329 Major depressive disorder, single episode, unspecified: Secondary | ICD-10-CM

## 2021-06-28 NOTE — Progress Notes (Signed)
Subjective:  Patient ID: Amanda Soto, female    DOB: 09/05/1924  Age: 87 y.o. MRN: [id number]  CC: The primary encounter diagnosis was Hypothyroidism, unspecified type. Diagnoses of Primary hypertension, Prediabetes, Chronic right-sided thoracic back pain, and Major depressive disorder without psychotic features were also pertinent to this visit.  HPI NORIE LATENDRESSE presents for  Chief Complaint  Patient presents with   Follow-up    4 month follow up on hypothyroidism   This visit occurred during the SARS-CoV-2 public health emergency.  Safety protocols were in place, including screening questions prior to the visit, additional usage of staff PPE, and extensive cleaning of exam room while observing appropriate contact time as indicated for disinfecting solutions.   Ms Klausner  New onset right posterior rib pain for several months after striking her back on the wheelchair.  Patient states that she was rushed into a wheelchair and out the door after her ESI.  She has osteoporosis and she is concerned she  has a broken rib  pain is constant unless she is lying down or sitting down.   Using tramadol ,  100 mg every 12  hours.     Outpatient Medications Prior to Visit  Medication Sig Dispense Refill   acetaminophen (TYLENOL) 325 MG tablet Take 500 mg by mouth every 6 (six) hours as needed for moderate pain, fever or headache.     amiodarone (PACERONE) 200 MG tablet Take 200 mg by mouth daily.     apixaban (ELIQUIS) 2.5 MG TABS tablet Take 1 tablet (2.5 mg total) by mouth 2 (two) times daily. 180 tablet 3   Calcium Citrate (CITRACAL PO) Take 1 tablet by mouth daily.     Cholecalciferol (VITAMIN D3) 50 MCG (2000 UT) CHEW Chew 1 tablet by mouth daily.     fluticasone (FLONASE) 50 MCG/ACT nasal spray SPRAY 2 SPRAYS INTO EACH NOSTRIL EVERY DAY     furosemide (LASIX) 20 MG tablet Take 20 mg by mouth as needed.     Multiple Vitamins-Minerals (PRESERVISION AREDS PO) Take 1 tablet by mouth daily.      SYNTHROID 50 MCG tablet TAKE 1 TABLET BY MOUTH EVERY DAY BEFORE BREAKFAST 90 tablet 1   traMADol (ULTRAM) 50 MG tablet Take 2 tablets (100 mg total) by mouth every 6 (six) hours. Each refill must last 30 days 240 tablet 5   vitamin B-12 (CYANOCOBALAMIN) 1000 MCG tablet Take by mouth.     Multiple Vitamins-Minerals (PRESERVISION AREDS 2 PO) Take by mouth in the morning and at bedtime. (Patient not taking: Reported on 06/28/2021)     No facility-administered medications prior to visit.    Review of Systems;  Patient denies headache, fevers, malaise, unintentional weight loss, skin rash, eye pain, sinus congestion and sinus pain, sore throat, dysphagia,  hemoptysis , cough, dyspnea, wheezing, chest pain, palpitations, orthopnea, edema, abdominal pain, nausea, melena, diarrhea, constipation, flank pain, dysuria, hematuria, urinary  Frequency, nocturia, numbness, tingling, seizures,  Focal weakness, Loss of consciousness,  Tremor, insomnia, depression, anxiety, and suicidal ideation.      Objective:  BP 128/70 (BP Location: Left Arm, Patient Position: Sitting, Cuff Size: Normal)    Pulse 99    Temp 97.8 F (36.6 C) (Oral)    Ht _0  (1.473 m)    Wt 106 lb 6.4 oz (48.3 kg)    SpO2 99%    BMI 22.24 kg/m   BP Readings from Last 3 Encounters:  06/28/21 128/70  02/28/21 (!) 100/56  02/17/21  125/60    Wt Readings from Last 3 Encounters:  06/28/21 106 lb 6.4 oz (48.3 kg)  02/28/21 112 lb (50.8 kg)  02/17/21 111 lb 3.2 oz (50.4 kg)    General appearance: alert, cooperative and appears stated age Ears: normal TM's and external ear canals both ears Throat: lips, mucosa, and tongue normal; teeth and gums normal Neck: no adenopathy, no carotid bruit, supple, symmetrical, trachea midline and thyroid not enlarged, symmetric, no tenderness/mass/nodules Back: symmetric, no curvature. ROM normal.  Tender over right posterior ribs. Lungs: clear to auscultation bilaterally Heart: regular rate and  rhythm, S1, S2 normal, no murmur, click, rub or gallop Abdomen: soft, non-tender; bowel sounds normal; no masses,  no organomegaly Pulses: 2+ and symmetric Skin: Skin color, texture, turgor normal. No rashes or lesions Lymph nodes: Cervical, supraclavicular, and axillary nodes normal. Psych: affect is pained.   makes good eye contact. No fidgeting, seldom smiles. denies suicidal thoughts   Lab Results  Component Value Date   HGBA1C 5.9 07/12/2017   HGBA1C 5.9 09/07/2015    Lab Results  Component Value Date   CREATININE 0.52 05/11/2020   CREATININE 0.46 (L) 04/26/2020   CREATININE 0.45 04/26/2020    Lab Results  Component Value Date   WBC 7.4 05/11/2020   HGB 13.1 05/11/2020   HCT 39.8 05/11/2020   PLT 240.0 05/11/2020   GLUCOSE 92 05/11/2020   ALT 9 04/26/2020   AST 19 04/26/2020   NA 134 (L) 05/11/2020   K 4.0 05/11/2020   CL 97 05/11/2020   CREATININE 0.52 05/11/2020   BUN 14 05/11/2020   CO2 32 05/11/2020   TSH 1.08 11/16/2020   INR 1.3 02/05/2014   HGBA1C 5.9 07/12/2017    DG PAIN CLINIC C-ARM 1-60 MIN NO REPORT  Result Date: 02/28/2021 Fluoro was used, but no Radiologist interpretation will be provided. Please refer to "NOTES" tab for provider progress note.   Assessment & Plan:   Problem List Items Addressed This Visit     Hypertension   Relevant Orders   Comp Met (CMET)   Hypothyroidism - Primary   Relevant Orders   TSH   Major depressive disorder without psychotic features    Screen is positive,, but she attributes her answers to her untreated pain . Trial of cymbalta suggested but deferred.       Chronic right-sided thoracic back pain    Present for several weeks after striking her back against the wheelchair she was placed in after her last ESI by the Pain clinic.  Ruling out fracture today.  Advised to continue tramadol 100 mg bid and add 1000 mg tylenol bid.   Home PT ordered for application of TENS unit.       Relevant Orders   DG Ribs  Unilateral Right   Ambulatory referral to Home Health   Other Visit Diagnoses     Prediabetes       Relevant Orders   Hemoglobin A1c       I have discontinued Mirenda J. Beal's Multiple Vitamins-Minerals (PRESERVISION AREDS 2 PO). I am also having her maintain her acetaminophen, vitamin B-12, fluticasone, Vitamin D3, apixaban, traMADol, Synthroid, Calcium Citrate (CITRACAL PO), furosemide, amiodarone, and Multiple Vitamins-Minerals (PRESERVISION AREDS PO).  No orders of the defined types were placed in this encounter.    I provided  30 minutes of  face-to-face time during this encounter reviewing patient's current problems and past surgeries, labs and imaging studies, providing counseling on the above mentioned problems , and  coordination  of care .   Follow-up: Return in about 4 weeks (around 07/26/2021).   Crecencio Mc, MD

## 2021-06-28 NOTE — Patient Instructions (Addendum)
°  You can add up to 2000 mg of acetominophen (tylenol) every day safely  In divided doses (500 mg every 6 hours  Or 1000 mg every 12 hours.)  TO YOUR CURRENT TRAMADOL DOSE   I will order home PT for your back pain   I THINK YOU SHOULD CONSIDER STARTING AN ANTIDEPRESSANT CALLED CYMBALTA

## 2021-06-28 NOTE — Assessment & Plan Note (Signed)
Screen is positive,, but she attributes her answers to her untreated pain . Trial of cymbalta suggested but deferred.

## 2021-06-28 NOTE — Assessment & Plan Note (Signed)
Present for several weeks after striking her back against the wheelchair she was placed in after her last ESI by the Pain clinic.  Ruling out fracture today.  Advised to continue tramadol 100 mg bid and add 1000 mg tylenol bid.   Home PT ordered for application of TENS unit.

## 2021-06-29 LAB — COMPREHENSIVE METABOLIC PANEL
ALT: 9 U/L (ref 0–35)
AST: 21 U/L (ref 0–37)
Albumin: 3.9 g/dL (ref 3.5–5.2)
Alkaline Phosphatase: 42 U/L (ref 39–117)
BUN: 17 mg/dL (ref 6–23)
CO2: 29 mEq/L (ref 19–32)
Calcium: 9.1 mg/dL (ref 8.4–10.5)
Chloride: 98 mEq/L (ref 96–112)
Creatinine, Ser: 0.7 mg/dL (ref 0.40–1.20)
GFR: 74.05 mL/min (ref >60.00)
Glucose, Bld: 90 mg/dL (ref 70–99)
Potassium: 4.1 mEq/L (ref 3.5–5.1)
Sodium: 136 mEq/L (ref 135–145)
Total Bilirubin: 1.2 mg/dL (ref 0.2–1.2)
Total Protein: 7.1 g/dL (ref 6.0–8.3)

## 2021-06-29 LAB — HEMOGLOBIN A1C: Hgb A1c MFr Bld: 5.6 % (ref 4.6–6.5)

## 2021-06-29 LAB — TSH: TSH: 5.61 u[IU]/mL — ABNORMAL HIGH (ref 0.35–5.50)

## 2021-06-30 ENCOUNTER — Telehealth: Payer: Self-pay | Admitting: Family Medicine

## 2021-06-30 DIAGNOSIS — D649 Anemia, unspecified: Secondary | ICD-10-CM | POA: Diagnosis not present

## 2021-06-30 DIAGNOSIS — M47817 Spondylosis without myelopathy or radiculopathy, lumbosacral region: Secondary | ICD-10-CM | POA: Diagnosis not present

## 2021-06-30 DIAGNOSIS — I509 Heart failure, unspecified: Secondary | ICD-10-CM | POA: Diagnosis not present

## 2021-06-30 DIAGNOSIS — M545 Low back pain, unspecified: Secondary | ICD-10-CM | POA: Diagnosis not present

## 2021-06-30 DIAGNOSIS — M5136 Other intervertebral disc degeneration, lumbar region: Secondary | ICD-10-CM | POA: Diagnosis not present

## 2021-06-30 DIAGNOSIS — I4891 Unspecified atrial fibrillation: Secondary | ICD-10-CM | POA: Diagnosis not present

## 2021-06-30 DIAGNOSIS — I10 Essential (primary) hypertension: Secondary | ICD-10-CM | POA: Diagnosis not present

## 2021-06-30 DIAGNOSIS — M546 Pain in thoracic spine: Secondary | ICD-10-CM | POA: Diagnosis not present

## 2021-06-30 DIAGNOSIS — Z6822 Body mass index (BMI) 22.0-22.9, adult: Secondary | ICD-10-CM | POA: Diagnosis not present

## 2021-06-30 DIAGNOSIS — G8929 Other chronic pain: Secondary | ICD-10-CM | POA: Diagnosis not present

## 2021-06-30 DIAGNOSIS — M1610 Unilateral primary osteoarthritis, unspecified hip: Secondary | ICD-10-CM | POA: Diagnosis not present

## 2021-06-30 DIAGNOSIS — E785 Hyperlipidemia, unspecified: Secondary | ICD-10-CM | POA: Diagnosis not present

## 2021-06-30 DIAGNOSIS — R7303 Prediabetes: Secondary | ICD-10-CM | POA: Diagnosis not present

## 2021-06-30 DIAGNOSIS — K219 Gastro-esophageal reflux disease without esophagitis: Secondary | ICD-10-CM | POA: Diagnosis not present

## 2021-06-30 DIAGNOSIS — I110 Hypertensive heart disease with heart failure: Secondary | ICD-10-CM | POA: Diagnosis not present

## 2021-06-30 DIAGNOSIS — I051 Rheumatic mitral insufficiency: Secondary | ICD-10-CM | POA: Diagnosis not present

## 2021-06-30 DIAGNOSIS — F32A Depression, unspecified: Secondary | ICD-10-CM | POA: Diagnosis not present

## 2021-06-30 DIAGNOSIS — Z9181 History of falling: Secondary | ICD-10-CM | POA: Diagnosis not present

## 2021-06-30 DIAGNOSIS — M810 Age-related osteoporosis without current pathological fracture: Secondary | ICD-10-CM | POA: Diagnosis not present

## 2021-06-30 DIAGNOSIS — E039 Hypothyroidism, unspecified: Secondary | ICD-10-CM | POA: Diagnosis not present

## 2021-06-30 NOTE — Telephone Encounter (Signed)
I called and gave lab and xray results to patient see result note. Johnn Krasowski,cma

## 2021-06-30 NOTE — Progress Notes (Signed)
I gave her instructions on her AVS about how to manage her pain

## 2021-06-30 NOTE — Telephone Encounter (Signed)
Pt called in stating that she had did an x-ray a few days ago. Pt stated that she never received her result. Pt requesting callback.

## 2021-07-01 DIAGNOSIS — I5022 Chronic systolic (congestive) heart failure: Secondary | ICD-10-CM

## 2021-07-01 DIAGNOSIS — M810 Age-related osteoporosis without current pathological fracture: Secondary | ICD-10-CM | POA: Diagnosis not present

## 2021-07-01 DIAGNOSIS — I4891 Unspecified atrial fibrillation: Secondary | ICD-10-CM | POA: Diagnosis not present

## 2021-07-06 MED ORDER — LEVOTHYROXINE SODIUM 75 MCG PO TABS: 75.0000 ug | ORAL_TABLET | Freq: Every day | ORAL | 1 refills | Status: AC

## 2021-07-07 ENCOUNTER — Telehealth: Payer: Self-pay

## 2021-07-07 DIAGNOSIS — Z9181 History of falling: Secondary | ICD-10-CM | POA: Diagnosis not present

## 2021-07-07 DIAGNOSIS — D649 Anemia, unspecified: Secondary | ICD-10-CM | POA: Diagnosis not present

## 2021-07-07 DIAGNOSIS — M47817 Spondylosis without myelopathy or radiculopathy, lumbosacral region: Secondary | ICD-10-CM | POA: Diagnosis not present

## 2021-07-07 DIAGNOSIS — M5136 Other intervertebral disc degeneration, lumbar region: Secondary | ICD-10-CM | POA: Diagnosis not present

## 2021-07-07 DIAGNOSIS — F32A Depression, unspecified: Secondary | ICD-10-CM | POA: Diagnosis not present

## 2021-07-07 DIAGNOSIS — G8929 Other chronic pain: Secondary | ICD-10-CM

## 2021-07-07 DIAGNOSIS — M810 Age-related osteoporosis without current pathological fracture: Secondary | ICD-10-CM | POA: Diagnosis not present

## 2021-07-07 DIAGNOSIS — M546 Pain in thoracic spine: Secondary | ICD-10-CM | POA: Diagnosis not present

## 2021-07-07 DIAGNOSIS — I4891 Unspecified atrial fibrillation: Secondary | ICD-10-CM | POA: Diagnosis not present

## 2021-07-07 DIAGNOSIS — K219 Gastro-esophageal reflux disease without esophagitis: Secondary | ICD-10-CM | POA: Diagnosis not present

## 2021-07-07 DIAGNOSIS — M1610 Unilateral primary osteoarthritis, unspecified hip: Secondary | ICD-10-CM | POA: Diagnosis not present

## 2021-07-07 DIAGNOSIS — I051 Rheumatic mitral insufficiency: Secondary | ICD-10-CM | POA: Diagnosis not present

## 2021-07-07 DIAGNOSIS — I509 Heart failure, unspecified: Secondary | ICD-10-CM | POA: Diagnosis not present

## 2021-07-07 DIAGNOSIS — E039 Hypothyroidism, unspecified: Secondary | ICD-10-CM | POA: Diagnosis not present

## 2021-07-07 DIAGNOSIS — E785 Hyperlipidemia, unspecified: Secondary | ICD-10-CM | POA: Diagnosis not present

## 2021-07-07 DIAGNOSIS — Z6822 Body mass index (BMI) 22.0-22.9, adult: Secondary | ICD-10-CM | POA: Diagnosis not present

## 2021-07-07 DIAGNOSIS — M545 Low back pain, unspecified: Secondary | ICD-10-CM | POA: Diagnosis not present

## 2021-07-07 DIAGNOSIS — I110 Hypertensive heart disease with heart failure: Secondary | ICD-10-CM | POA: Diagnosis not present

## 2021-07-07 DIAGNOSIS — R7303 Prediabetes: Secondary | ICD-10-CM | POA: Diagnosis not present

## 2021-07-11 DIAGNOSIS — I110 Hypertensive heart disease with heart failure: Secondary | ICD-10-CM | POA: Diagnosis not present

## 2021-07-11 DIAGNOSIS — G8929 Other chronic pain: Secondary | ICD-10-CM | POA: Diagnosis not present

## 2021-07-11 DIAGNOSIS — I4891 Unspecified atrial fibrillation: Secondary | ICD-10-CM | POA: Diagnosis not present

## 2021-07-11 DIAGNOSIS — M5136 Other intervertebral disc degeneration, lumbar region: Secondary | ICD-10-CM | POA: Diagnosis not present

## 2021-07-11 DIAGNOSIS — E785 Hyperlipidemia, unspecified: Secondary | ICD-10-CM | POA: Diagnosis not present

## 2021-07-11 DIAGNOSIS — M546 Pain in thoracic spine: Secondary | ICD-10-CM | POA: Diagnosis not present

## 2021-07-11 DIAGNOSIS — F32A Depression, unspecified: Secondary | ICD-10-CM | POA: Diagnosis not present

## 2021-07-11 DIAGNOSIS — K219 Gastro-esophageal reflux disease without esophagitis: Secondary | ICD-10-CM | POA: Diagnosis not present

## 2021-07-11 DIAGNOSIS — I509 Heart failure, unspecified: Secondary | ICD-10-CM | POA: Diagnosis not present

## 2021-07-11 DIAGNOSIS — R7303 Prediabetes: Secondary | ICD-10-CM | POA: Diagnosis not present

## 2021-07-11 DIAGNOSIS — M545 Low back pain, unspecified: Secondary | ICD-10-CM | POA: Diagnosis not present

## 2021-07-11 DIAGNOSIS — Z6822 Body mass index (BMI) 22.0-22.9, adult: Secondary | ICD-10-CM | POA: Diagnosis not present

## 2021-07-11 DIAGNOSIS — Z9181 History of falling: Secondary | ICD-10-CM | POA: Diagnosis not present

## 2021-07-11 DIAGNOSIS — M47817 Spondylosis without myelopathy or radiculopathy, lumbosacral region: Secondary | ICD-10-CM | POA: Diagnosis not present

## 2021-07-11 DIAGNOSIS — D649 Anemia, unspecified: Secondary | ICD-10-CM | POA: Diagnosis not present

## 2021-07-11 DIAGNOSIS — M810 Age-related osteoporosis without current pathological fracture: Secondary | ICD-10-CM | POA: Diagnosis not present

## 2021-07-11 DIAGNOSIS — M1610 Unilateral primary osteoarthritis, unspecified hip: Secondary | ICD-10-CM | POA: Diagnosis not present

## 2021-07-11 DIAGNOSIS — E039 Hypothyroidism, unspecified: Secondary | ICD-10-CM | POA: Diagnosis not present

## 2021-07-11 DIAGNOSIS — I051 Rheumatic mitral insufficiency: Secondary | ICD-10-CM | POA: Diagnosis not present

## 2021-07-12 ENCOUNTER — Telehealth: Payer: Self-pay | Admitting: Pain Medicine

## 2021-07-12 NOTE — Telephone Encounter (Signed)
Patient states that her pain has not gotten any better since her last procedure.  States she fell against wheelchair when going out to the car to be discharged.  States she had xrays and there was nothing wrong.  When asked where her pain was she became agitated and states that she did not know.  She thought it was some in her tailbone and goes up from there.  Spoke with Dr Dossie Arbour and he said to bring her in for an appointment to figure out what needs to be done for this patient.  Message sent to [facility].

## 2021-07-12 NOTE — Telephone Encounter (Signed)
Patient lvmail stating her back is really hurting. She sounded very frantic and like she was in a lot of pain. her next appt is 08-02-21 for MM. Please call patient asap

## 2021-07-13 DIAGNOSIS — M5136 Other intervertebral disc degeneration, lumbar region: Secondary | ICD-10-CM | POA: Diagnosis not present

## 2021-07-13 DIAGNOSIS — F32A Depression, unspecified: Secondary | ICD-10-CM | POA: Diagnosis not present

## 2021-07-13 DIAGNOSIS — I4891 Unspecified atrial fibrillation: Secondary | ICD-10-CM | POA: Diagnosis not present

## 2021-07-13 DIAGNOSIS — R7303 Prediabetes: Secondary | ICD-10-CM | POA: Diagnosis not present

## 2021-07-13 DIAGNOSIS — E039 Hypothyroidism, unspecified: Secondary | ICD-10-CM | POA: Diagnosis not present

## 2021-07-13 DIAGNOSIS — I509 Heart failure, unspecified: Secondary | ICD-10-CM | POA: Diagnosis not present

## 2021-07-13 DIAGNOSIS — M810 Age-related osteoporosis without current pathological fracture: Secondary | ICD-10-CM | POA: Diagnosis not present

## 2021-07-13 DIAGNOSIS — M1610 Unilateral primary osteoarthritis, unspecified hip: Secondary | ICD-10-CM | POA: Diagnosis not present

## 2021-07-13 DIAGNOSIS — Z9181 History of falling: Secondary | ICD-10-CM | POA: Diagnosis not present

## 2021-07-13 DIAGNOSIS — Z6822 Body mass index (BMI) 22.0-22.9, adult: Secondary | ICD-10-CM | POA: Diagnosis not present

## 2021-07-13 DIAGNOSIS — I110 Hypertensive heart disease with heart failure: Secondary | ICD-10-CM | POA: Diagnosis not present

## 2021-07-13 DIAGNOSIS — E785 Hyperlipidemia, unspecified: Secondary | ICD-10-CM | POA: Diagnosis not present

## 2021-07-13 DIAGNOSIS — M546 Pain in thoracic spine: Secondary | ICD-10-CM | POA: Diagnosis not present

## 2021-07-13 DIAGNOSIS — D649 Anemia, unspecified: Secondary | ICD-10-CM | POA: Diagnosis not present

## 2021-07-13 DIAGNOSIS — M47817 Spondylosis without myelopathy or radiculopathy, lumbosacral region: Secondary | ICD-10-CM | POA: Diagnosis not present

## 2021-07-13 DIAGNOSIS — G8929 Other chronic pain: Secondary | ICD-10-CM | POA: Diagnosis not present

## 2021-07-13 DIAGNOSIS — M545 Low back pain, unspecified: Secondary | ICD-10-CM | POA: Diagnosis not present

## 2021-07-13 DIAGNOSIS — I051 Rheumatic mitral insufficiency: Secondary | ICD-10-CM | POA: Diagnosis not present

## 2021-07-13 DIAGNOSIS — K219 Gastro-esophageal reflux disease without esophagitis: Secondary | ICD-10-CM | POA: Diagnosis not present

## 2021-07-13 NOTE — Telephone Encounter (Signed)
Re-submitted referral

## 2021-07-18 ENCOUNTER — Other Ambulatory Visit: Payer: Self-pay | Admitting: Pain Medicine

## 2021-07-18 ENCOUNTER — Ambulatory Visit: Payer: Medicare Other | Admitting: Pain Medicine

## 2021-07-18 ENCOUNTER — Telehealth: Payer: Self-pay | Admitting: Pain Medicine

## 2021-07-18 DIAGNOSIS — M461 Sacroiliitis, not elsewhere classified: Secondary | ICD-10-CM | POA: Insufficient documentation

## 2021-07-18 DIAGNOSIS — M47816 Spondylosis without myelopathy or radiculopathy, lumbar region: Secondary | ICD-10-CM | POA: Insufficient documentation

## 2021-07-18 DIAGNOSIS — M160 Bilateral primary osteoarthritis of hip: Secondary | ICD-10-CM | POA: Insufficient documentation

## 2021-07-18 DIAGNOSIS — G8929 Other chronic pain: Secondary | ICD-10-CM | POA: Insufficient documentation

## 2021-07-18 DIAGNOSIS — M8580 Other specified disorders of bone density and structure, unspecified site: Secondary | ICD-10-CM | POA: Insufficient documentation

## 2021-07-18 NOTE — Progress Notes (Deleted)
Appointment rescheduled.

## 2021-07-18 NOTE — Progress Notes (Signed)
Amanda Soto spoke to the patient on 07/12/2021 and we had scheduled her to come in today 07/18/2021 at 1:40 PM.  She called at 1 PM saying that nobody had talked to her last week, which is not true since we have given her an appointment for today.  I think that she is having some serious problems with her memory and I can tell this is the case based on her reports on her pain and how she says that the procedures have not helped when we have clear documentation from having talked to her and her indicating that she did get benefit.  I do understand that she is 88 years old, but we probably need to deal directly with older family members since she is not really remembering the things that were telling her.

## 2021-07-18 NOTE — Telephone Encounter (Signed)
Thanks for the update

## 2021-07-18 NOTE — Telephone Encounter (Addendum)
Patient called at 1pm stating she was in a lot of pain. She said she called last week and no one returned her call. I took call on 07-13-21 and called her back with an appt. For 07-18-21 at 1:40. She says I never called her.  Gave patient another appt for 07-20-21 at 2pm.  Made sure she understood, she repeated appt back to me. I ask her to make sure she arranged her ride today so she could come on thurs.  FYI, documentation.  Spoke with her son Chriss Czar and gave him appt info.

## 2021-07-19 DIAGNOSIS — I509 Heart failure, unspecified: Secondary | ICD-10-CM | POA: Diagnosis not present

## 2021-07-19 DIAGNOSIS — G8929 Other chronic pain: Secondary | ICD-10-CM | POA: Diagnosis not present

## 2021-07-19 DIAGNOSIS — D649 Anemia, unspecified: Secondary | ICD-10-CM | POA: Diagnosis not present

## 2021-07-19 DIAGNOSIS — Z6822 Body mass index (BMI) 22.0-22.9, adult: Secondary | ICD-10-CM | POA: Diagnosis not present

## 2021-07-19 DIAGNOSIS — I4891 Unspecified atrial fibrillation: Secondary | ICD-10-CM | POA: Diagnosis not present

## 2021-07-19 DIAGNOSIS — I051 Rheumatic mitral insufficiency: Secondary | ICD-10-CM | POA: Diagnosis not present

## 2021-07-19 DIAGNOSIS — M545 Low back pain, unspecified: Secondary | ICD-10-CM | POA: Diagnosis not present

## 2021-07-19 DIAGNOSIS — M810 Age-related osteoporosis without current pathological fracture: Secondary | ICD-10-CM | POA: Diagnosis not present

## 2021-07-19 DIAGNOSIS — M5136 Other intervertebral disc degeneration, lumbar region: Secondary | ICD-10-CM | POA: Diagnosis not present

## 2021-07-19 DIAGNOSIS — I110 Hypertensive heart disease with heart failure: Secondary | ICD-10-CM | POA: Diagnosis not present

## 2021-07-19 DIAGNOSIS — M1610 Unilateral primary osteoarthritis, unspecified hip: Secondary | ICD-10-CM | POA: Diagnosis not present

## 2021-07-19 DIAGNOSIS — K219 Gastro-esophageal reflux disease without esophagitis: Secondary | ICD-10-CM | POA: Diagnosis not present

## 2021-07-19 DIAGNOSIS — E785 Hyperlipidemia, unspecified: Secondary | ICD-10-CM | POA: Diagnosis not present

## 2021-07-19 DIAGNOSIS — R7303 Prediabetes: Secondary | ICD-10-CM | POA: Diagnosis not present

## 2021-07-19 DIAGNOSIS — M546 Pain in thoracic spine: Secondary | ICD-10-CM | POA: Diagnosis not present

## 2021-07-19 DIAGNOSIS — F32A Depression, unspecified: Secondary | ICD-10-CM | POA: Diagnosis not present

## 2021-07-19 DIAGNOSIS — E039 Hypothyroidism, unspecified: Secondary | ICD-10-CM | POA: Diagnosis not present

## 2021-07-19 DIAGNOSIS — M47817 Spondylosis without myelopathy or radiculopathy, lumbosacral region: Secondary | ICD-10-CM | POA: Diagnosis not present

## 2021-07-19 DIAGNOSIS — Z9181 History of falling: Secondary | ICD-10-CM | POA: Diagnosis not present

## 2021-07-20 ENCOUNTER — Ambulatory Visit: Payer: Medicare Other | Attending: Pain Medicine | Admitting: Pain Medicine

## 2021-07-20 ENCOUNTER — Encounter: Payer: Self-pay | Admitting: Pain Medicine

## 2021-07-20 ENCOUNTER — Other Ambulatory Visit: Payer: Self-pay

## 2021-07-20 VITALS — BP 118/69 | HR 84 | Temp 97.0°F | Resp 16 | Ht <= 58 in | Wt 106.0 lb

## 2021-07-20 DIAGNOSIS — M160 Bilateral primary osteoarthritis of hip: Secondary | ICD-10-CM

## 2021-07-20 DIAGNOSIS — M549 Dorsalgia, unspecified: Secondary | ICD-10-CM | POA: Insufficient documentation

## 2021-07-20 DIAGNOSIS — M5136 Other intervertebral disc degeneration, lumbar region: Secondary | ICD-10-CM | POA: Diagnosis not present

## 2021-07-20 DIAGNOSIS — Z79899 Other long term (current) drug therapy: Secondary | ICD-10-CM | POA: Insufficient documentation

## 2021-07-20 DIAGNOSIS — M4310 Spondylolisthesis, site unspecified: Secondary | ICD-10-CM | POA: Insufficient documentation

## 2021-07-20 DIAGNOSIS — Z79891 Long term (current) use of opiate analgesic: Secondary | ICD-10-CM | POA: Diagnosis not present

## 2021-07-20 DIAGNOSIS — G8929 Other chronic pain: Secondary | ICD-10-CM | POA: Diagnosis not present

## 2021-07-20 DIAGNOSIS — G894 Chronic pain syndrome: Secondary | ICD-10-CM

## 2021-07-20 DIAGNOSIS — M545 Low back pain, unspecified: Secondary | ICD-10-CM | POA: Insufficient documentation

## 2021-07-20 DIAGNOSIS — M47816 Spondylosis without myelopathy or radiculopathy, lumbar region: Secondary | ICD-10-CM | POA: Diagnosis not present

## 2021-07-20 MED ORDER — TRIAMCINOLONE ACETONIDE 40 MG/ML IJ SUSP
40.0000 mg | Freq: Once | INTRAMUSCULAR | Status: AC
  Administered 2021-07-20: 40 mg

## 2021-07-20 MED ORDER — TRIAMCINOLONE ACETONIDE 40 MG/ML IJ SUSP
INTRAMUSCULAR | Status: AC
  Filled 2021-07-20: qty 1

## 2021-07-20 MED ORDER — TRAMADOL HCL 50 MG PO TABS: 100.0000 mg | ORAL_TABLET | Freq: Four times a day (QID) | ORAL | 5 refills | Status: AC

## 2021-07-20 MED ORDER — ROPIVACAINE HCL 2 MG/ML IJ SOLN
4.0000 mL | Freq: Once | INTRAMUSCULAR | Status: AC
  Administered 2021-07-20: 4 mL

## 2021-07-20 MED ORDER — ROPIVACAINE HCL 2 MG/ML IJ SOLN
INTRAMUSCULAR | Status: AC
  Filled 2021-07-20: qty 20

## 2021-07-20 NOTE — Progress Notes (Signed)
Nursing Pain Medication Assessment:  Safety precautions to be maintained throughout the outpatient stay will include: orient to surroundings, keep bed in low position, maintain call bell within reach at all times, provide assistance with transfer out of bed and ambulation.  Medication Inspection Compliance: Pill count conducted under aseptic conditions, in front of the patient. Neither the pills nor the bottle was removed from the patient's sight at any time. Once count was completed pills were immediately returned to the patient in their original bottle.  Medication: Tramadol (Ultram) Pill/Patch Count:  207 of 240 pills remain Pill/Patch Appearance: Markings consistent with prescribed medication Bottle Appearance: Standard pharmacy container. Clearly labeled. Filled Date: 72 / 26 / 2022 Last Medication intake:  Today

## 2021-07-20 NOTE — Progress Notes (Signed)
PROVIDER NOTE: Interpretation of information contained herein should be left to medically-trained personnel. Specific patient instructions are provided elsewhere under "Patient Instructions" section of medical record. This document was created in part using STT-dictation technology, any transcriptional errors that may result from this process are unintentional.  Patient: Amanda Soto Type: Established DOB: Feb 13, 1925 MRN: [id number] PCP: [name], MD  Service: Procedure DOS: 07/20/2021 Setting: Ambulatory Location: Ambulatory outpatient facility Delivery: Face-to-face Provider: Gaspar Cola, MD Specialty: Interventional Pain Management Specialty designation: 09 Location: Outpatient facility Ref. Prov.: [name], MD    Primary Reason for Visit: Interventional Pain Management Treatment. CC: Back Pain (Low to mid)    Procedure:          Anesthesia, Analgesia, Anxiolysis:  Type: Erector spinae muscle Trigger Point Injection (1-2 muscle groups) #1  CPT: 20552 Primary Purpose: Diagnostic/therapeutic Region: Posterolateral Thoracolumbar Level: Thoracolumbar Target Area: Erector spinae muscle Trigger Point Approach: Percutaneous, ipsilateral approach. Laterality: Right        Type: Local Anesthesia Local Anesthetic: Lidocaine 1-2% Sedation: None  Indication(s):  Analgesia Route: Infiltration ([place]/IM) IV Access: N/A   Position: Sitting   1. Trigger point with back pain (Right)   2. Chronic low back pain (1ry area of Pain) (Right)   3. DDD (degenerative disc disease), lumbar   4. Grade 1 Anterolisthesis of L3/L4 and L4/L5 (5 mm)   5. Lumbar facet arthropathy   6. Lumbar facet hypertrophy (Multilevel) (Bilateral)   7. Lumbar spondylosis   8. Osteoarthritis of hips (Bilateral)   9. Chronic pain syndrome   10. Pharmacologic therapy   11. Chronic use of opiate for therapeutic purpose    NAS-11 Pain score:   Pre-procedure: 8 /10   Post-procedure: 0-No  pain/10     Pre-op H&P Assessment:  Amanda Soto is a 88 y.o. (year old), female patient, seen today for interventional treatment. She  has a past surgical history that includes Cataract extraction; Partial hysterectomy; Appendectomy; and Tonsillectomy. Amanda Soto has a current medication list which includes the following prescription(s): acetaminophen, amiodarone, apixaban, calcium citrate, vitamin d3, fluticasone, furosemide, levothyroxine, multiple vitamins-minerals, vitamin b-12, and [START ON 08/06/2021] tramadol. Her primarily concern today is the Back Pain (Low to mid)  Initial Vital Signs:  Pulse/HCG Rate: 61  Temp: (!) 97 F (36.1 C) Resp: 16 BP: (!) 109/92 SpO2: 100 %  BMI: Estimated body mass index is 22.15 kg/m as calculated from the following:   Height as of this encounter: 4\' 10"  (1.473 m).   Weight as of this encounter: 106 lb (48.1 kg).  Risk Assessment: Allergies: Reviewed. She is allergic to hydrocodone, ciprofloxacin, and latex.  Allergy Precautions: None required Coagulopathies: Reviewed. None identified.  Blood-thinner therapy: None at this time Active Infection(s): Reviewed. None identified. Amanda Soto is afebrile  Site Confirmation: Amanda Soto was asked to confirm the procedure and laterality before marking the site Procedure checklist: Completed Consent: Before the procedure and under the influence of no sedative(s), amnesic(s), or anxiolytics, the patient was informed of the treatment options, risks and possible complications. To fulfill our ethical and legal obligations, as recommended by the American Medical Association's Code of Ethics, I have informed the patient of my clinical impression; the nature and purpose of the treatment or procedure; the risks, benefits, and possible complications of the intervention; the alternatives, including doing nothing; the risk(s) and benefit(s) of the alternative treatment(s) or procedure(s); and the risk(s) and benefit(s) of doing  nothing. The patient was provided information about the general  risks and possible complications associated with the procedure. These may include, but are not limited to: failure to achieve desired goals, infection, bleeding, organ or nerve damage, allergic reactions, paralysis, and death. In addition, the patient was informed of those risks and complications associated to the procedure, such as failure to decrease pain; infection; bleeding; organ or nerve damage with subsequent damage to sensory, motor, and/or autonomic systems, resulting in permanent pain, numbness, and/or weakness of one or several areas of the body; allergic reactions; (i.e.: anaphylactic reaction); and/or death. Furthermore, the patient was informed of those risks and complications associated with the medications. These include, but are not limited to: allergic reactions (i.e.: anaphylactic or anaphylactoid reaction(s)); adrenal axis suppression; blood sugar elevation that in diabetics may result in ketoacidosis or comma; water retention that in patients with history of congestive heart failure may result in shortness of breath, pulmonary edema, and decompensation with resultant heart failure; weight gain; swelling or edema; medication-induced neural toxicity; particulate matter embolism and blood vessel occlusion with resultant organ, and/or nervous system infarction; and/or aseptic necrosis of one or more joints. Finally, the patient was informed that Medicine is not an exact science; therefore, there is also the possibility of unforeseen or unpredictable risks and/or possible complications that may result in a catastrophic outcome. The patient indicated having understood very clearly. We have given the patient no guarantees and we have made no promises. Enough time was given to the patient to ask questions, all of which were answered to the patient's satisfaction. Amanda Soto has indicated that she wanted to continue with the  procedure. Attestation: I, the ordering provider, attest that I have discussed with the patient the benefits, risks, side-effects, alternatives, likelihood of achieving goals, and potential problems during recovery for the procedure that I have provided informed consent. Date   Time: 07/20/2021  1:47 PM  Pre-Procedure Preparation:  Monitoring: As per clinic protocol. Respiration, ETCO2, SpO2, BP, heart rate and rhythm monitor placed and checked for adequate function Safety Precautions: Patient was assessed for positional comfort and pressure points before starting the procedure. Time-out: I initiated and conducted the "Time-out" before starting the procedure, as per protocol. The patient was asked to participate by confirming the accuracy of the "Time Out" information. Verification of the correct person, site, and procedure were performed and confirmed by me, the nursing staff, and the patient. "Time-out" conducted as per Joint Commission's Universal Protocol (UP.01.01.01). Time: 1448  Description of Procedure:          Area Prepped: Entire             Region DuraPrep (Iodine Povacrylex [0.7% available iodine] and Isopropyl Alcohol, 74% w/w) Safety Precautions: Aspiration looking for blood return was conducted prior to all injections. At no point did we inject any substances, as a needle was being advanced. No attempts were made at seeking any paresthesias. Safe injection practices and needle disposal techniques used. Medications properly checked for expiration dates. SDV (single dose vial) medications used. Description of the Procedure: Protocol guidelines were followed. The patient was placed in position over the fluoroscopy table. The target area was identified and the area prepped in the usual manner. Skin & deeper tissues infiltrated with local anesthetic. Appropriate amount of time allowed to pass for local anesthetics to take effect. The procedure needles were then advanced to the target area.  Proper needle placement secured. Negative aspiration confirmed. Solution injected in intermittent fashion, asking for systemic symptoms every 0.5cc of injectate. The needles were then removed and  the area cleansed, making sure to leave some of the prepping solution back to take advantage of its long term bactericidal properties.  Vitals:   07/20/21 1347 07/20/21 1433  BP: (!) 109/92 118/69  Pulse: 61 84  Resp: 16 16  Temp: (!) 97 F (36.1 C)   SpO2: 100% 100%  Weight: 106 lb (48.1 kg)   Height: 4\' 10"  (1.473 m)     Start Time: 1448 hrs. End Time: 1430 hrs. Materials:  Needle(s) Type: Epidural needle Gauge: 20G Length: 3.5-in Medication(s): Please see orders for medications and dosing details.  Imaging Guidance:          Type of Imaging Technique: None used Indication(s): N/A Exposure Time: No patient exposure Contrast: None used. Fluoroscopic Guidance: N/A Ultrasound Guidance: N/A Interpretation: N/A  Antibiotic Prophylaxis:   Anti-infectives (From admission, onward)    None      Indication(s): None identified  Post-operative Assessment:  Post-procedure Vital Signs:  Pulse/HCG Rate: 84  Temp:  (!) 97 F (36.1 C) Resp: 16 BP:  118/69 SpO2: 100 %  EBL: None  Complications: No immediate post-treatment complications observed by team, or reported by patient.  Note: The patient tolerated the entire procedure well. A repeat set of vitals were taken after the procedure and the patient was kept under observation following institutional policy, for this type of procedure. Post-procedural neurological assessment was performed, showing return to baseline, prior to discharge. The patient was provided with post-procedure discharge instructions, including a section on how to identify potential problems. Should any problems arise concerning this procedure, the patient was given instructions to immediately contact us, at any time, without hesitation. In any case, we plan to  contact the patient by telephone for a follow-up status report regarding this interventional procedure.  Comments:  No additional relevant information.  Plan of Care  Orders:  Orders Placed This Encounter  Procedures   TRIGGER POINT INJECTION    Scheduling Instructions:     Area: Lower Back     Side: Right     Sedation: No Sedation.     Timeframe: Today    Order Specific Question:   Where will this procedure be performed?    Answer:   [facility]   Informed Consent Details: Physician/Practitioner Attestation; Transcribe to consent form and obtain patient signature    Provider Attestation: I, [place] Dossie Arbour, MD, (Pain Management Specialist), the physician/practitioner, attest that I have discussed with the patient the benefits, risks, side effects, alternatives, likelihood of achieving goals and potential problems during recovery for the procedure that I have provided informed consent.    Scheduling Instructions:     Note: Always confirm laterality of pain with Ms. Neto, before procedure.     Transcribe to consent form and obtain patient signature.    Order Specific Question:   Physician/Practitioner attestation of informed consent for procedure/surgical case    Answer:   I, the physician/practitioner, attest that I have discussed with the patient the benefits, risks, side effects, alternatives, likelihood of achieving goals and potential problems during recovery for the procedure that I have provided informed consent.    Order Specific Question:   Procedure    Answer:   Myoneural Block (Trigger Point injection)    Order Specific Question:   Physician/Practitioner performing the procedure    Answer:   Haevyn Ury A. Dossie Arbour MD    Order Specific Question:   Indication/Reason    Answer:   Musculoskeletal pain/myofascial pain secondary to trigger point  Provide equipment / supplies at bedside    "Block Tray" (Disposable   single use) Needle type:  SpinalRegular Amount/quantity: 1 Size: Short(1.5-inch) Gauge: 25G    Standing Status:   Standing    Number of Occurrences:   1    Order Specific Question:   Specify    Answer:   Block Tray   Chronic Opioid Analgesic:  Tramadol 50 mg, 1 tablet PO q 6 hrs (200 mg/day of tramadol) MME/day: 20 mg/day.   Medications ordered for procedure: Meds ordered this encounter  Medications   traMADol (ULTRAM) 50 MG tablet    Sig: Take 2 tablets (100 mg total) by mouth every 6 (six) hours. Each refill must last 30 days    Dispense:  240 tablet    Refill:  5    DO NOT: delete (not duplicate); no partial-fill (will deny script to complete), no refill request (F/U required). DISPENSE: 1 day early if closed on fill date. WARN: No CNS-depressants within 8 hrs of med.   ropivacaine (PF) 2 mg/mL (0.2%) (NAROPIN) injection 4 mL   triamcinolone acetonide (KENALOG-40) injection 40 mg   Medications administered: We administered ropivacaine (PF) 2 mg/mL (0.2%) and triamcinolone acetonide.  See the medical record for exact dosing, route, and time of administration.  Follow-up plan:   Return in about 2 weeks (around 08/03/2021) for Proc-day (T,Th), (VV), (PPE).       Interventional Therapies  Risk   Complexity Considerations:   Estimated body mass index is 22.15 kg/m as calculated from the following:   Height as of this encounter: 4\' 10"  (1.473 m).   Weight as of this encounter: 106 lb (48.1 kg). NOTE: Eliquis Anticoagulation (Stop:3 days   Restart: 6 hrs)  Advanced age  Latex allergy   Planned   Pending:      Under consideration:   Palliative right lumbar facet RFA #3   Completed:   Palliative/therapeutic caudal ESI x1 (07/21/2019) (100/100/90/90)  Palliative right lumbar facet (L2, L3, L4, L5, & S1) MBB x9 (09/07/2020) (100/100/90/75)  Palliative/Therapeutic right lumbar facet RFA x3 (01/28/21) (2nd: 100/100/100/90)  Palliative right sacroiliac joint block x4 (01/24/2021) (100/100/0/0)   Palliative/therapeutic right-sided SI joint RFA x1 (03/27/2018) (100/90/0)    Therapeutic   Palliative (PRN) options:   Palliative/therapeutic caudal ESI #2  Palliative right lumbar facet block #10  Palliative/Therapeutic right lumbar facet RFA #3     Recent Visits Date Type Provider Dept  07/18/21 Appointment Milinda Pointer, MD [facility]  Showing recent visits within past 90 days and meeting all other requirements Today's Visits Date Type Provider Dept  07/20/21 Office Visit Milinda Pointer, MD [facility]  Showing today's visits and meeting all other requirements Future Appointments Date Type Provider Dept  08/03/21 Appointment Milinda Pointer, MD [facility]  Showing future appointments within next 90 days and meeting all other requirements  Disposition: Discharge home  Discharge (Date   Time): 07/20/2021; 1432 hrs.   Primary Care Physician: [name], MD Location: [facility] Note by: Gaspar Cola, MD Date: 07/20/2021; Time: 2:45 PM  Disclaimer:  Medicine is not an Chief Strategy Officer. The only guarantee in medicine is that nothing is guaranteed. It is important to note that the decision to proceed with this intervention was based on the information collected from the patient. The Data and conclusions were drawn from the patient's questionnaire, the interview, and the physical examination. Because the information was provided in large part by the patient, it  cannot be guaranteed that it has not been purposely or unconsciously manipulated. Every effort has been made to obtain as much relevant data as possible for this evaluation. It is important to note that the conclusions that lead to this procedure are derived in large part from the available data. Always take into account that the treatment will also be dependent on availability of resources and existing treatment guidelines, considered by other Pain  Management Practitioners as being common knowledge and practice, at the time of the intervention. For Medico-Legal purposes, it is also important to point out that variation in procedural techniques and pharmacological choices are the acceptable norm. The indications, contraindications, technique, and results of the above procedure should only be interpreted and judged by a Board-Certified Interventional Pain Specialist with extensive familiarity and expertise in the same exact procedure and technique.

## 2021-07-20 NOTE — Progress Notes (Signed)
PROVIDER NOTE: Information contained herein reflects review and annotations entered in association with encounter. Interpretation of such information and data should be left to medically-trained personnel. Information provided to patient can be located elsewhere in the medical record under "Patient Instructions". Document created using STT-dictation technology, any transcriptional errors that may result from process are unintentional.    Patient: Amanda Soto  Service Category: E/M  Provider: Gaspar Cola, MD  DOB: 1924-10-30  DOS: 07/20/2021  Specialty: Interventional Pain Management  MRN: [id number]  Setting: Ambulatory outpatient  PCP: [name], MD  Type: Established Patient    Referring Provider: [name], MD  Location: Office  Delivery: Face-to-face     HPI  Amanda Soto, a 88 y.o. year old female, is here today because of her Trigger point with back pain [M54.9]. Ms. Mclees primary complain today is Back Pain (Low to mid) Last encounter: My last encounter with her was on 07/18/2021. Pertinent problems: Ms. Dematteo has Degeneration of intervertebral disc of lumbar region; H/O neoplasm; Chronic low back pain (1ry area of Pain) (Right); Lumbar facet syndrome (Right); Grade 1 Anterolisthesis of L3/L4 and L4/L5 (5 mm); Lumbar spondylosis; Scoliosis of lumbar spine (concave to right side); Lumbar facet arthropathy; Osteoarthritis of hip (Right); Thoracic paracentral T7-8 disc protrusion; Chronic pain syndrome; Chronic sacroiliac joint pain (Right); DDD (degenerative disc disease), lumbar; Spondylosis without myelopathy or radiculopathy, lumbosacral region; Other specified dorsopathies, sacral and sacrococcygeal region; Headache; Chronic hip pain (Left); Chronic elbow pain (Right); Neurogenic pain; Coccygodynia; Traumatic fracture of coccyx, sequela; 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 Pain Assessment: Severity of Chronic pain is reported as a 8 /10. Location: Back Lower, Right/denies. Onset: More than a month ago. Quality: Hervey Ard, Aching. Timing: Constant. Modifying factor(s): nothing. Vitals:  height is 4' 10"  (1.473 m) and weight is 106 lb (48.1 kg). Her temperature is 97 F (36.1 C) (abnormal). Her blood pressure is 118/69 and her pulse is 84. Her respiration is 16 and oxygen saturation is 100%.   Reason for encounter: evaluation of worsening, or previously known (established) problem and new pain and new pain.   The patient indicates doing well with the current medication regimen. No adverse reactions or side effects reported to the medications.   Pharmacotherapy Assessment  Analgesic: Tramadol 50 mg, 1 tablet PO q 6 hrs (200 mg/day of tramadol) MME/day: 20 mg/day.   Monitoring: [place] PMP: PDMP reviewed during this encounter.       Pharmacotherapy: No side-effects or adverse reactions reported. Compliance: No problems identified. Effectiveness: Clinically acceptable.  Dewayne Shorter, RN  07/20/2021  1:53 PM  Sign when Signing Visit Nursing Pain Medication Assessment:  Safety precautions to be maintained throughout the outpatient stay will include: orient to surroundings, keep bed in low position, maintain call bell within reach at all times, provide assistance with transfer out of bed and ambulation.  Medication Inspection Compliance: Pill count conducted under aseptic conditions, in front of the patient. Neither the pills nor the bottle was removed from the patient's sight at any time. Once  count was completed pills were immediately returned to the patient in their  original bottle.  Medication: Tramadol (Ultram) Pill/Patch Count:  207 of 240 pills remain Pill/Patch Appearance: Markings consistent with prescribed medication Bottle Appearance: Standard pharmacy container. Clearly labeled. Filled Date: 87 / 26 / 2022 Last Medication intake:  Today    UDS:  Summary  Date Value Ref Range Status  10/24/2020 Note  Final    Comment:    ==================================================================== [employer] Select 13 (MW) ==================================================================== Test                             Result       Flag       Units  Drug Present and Declared for Prescription Verification   Tramadol                       >6667        EXPECTED   ng/mg creat   O-Desmethyltramadol            >6667        EXPECTED   ng/mg creat   N-Desmethyltramadol            >6667        EXPECTED   ng/mg creat    Source of tramadol is a prescription medication. O-desmethyltramadol    and N-desmethyltramadol are expected metabolites of tramadol.  ==================================================================== Test                      Result    Flag   Units      Ref Range   Creatinine              75               mg/dL      >=20 ==================================================================== Declared Medications:  The flagging and interpretation on this report are based on the  following declared medications.  Unexpected results may arise from  inaccuracies in the declared medications.   **Note: The testing scope of this panel includes these medications:   Tramadol   **Note: The testing scope of this panel does not include the  following reported medications:   Acetaminophen  Apixaban  Cyanocobalamin  Fluticasone  Furosemide  Levothyroxine  Multivitamin  Omeprazole  Sotalol  Vitamin  D3 ==================================================================== For clinical consultation, please call [phone]. ====================================================================      ROS  Constitutional: Denies any fever or chills Gastrointestinal: No reported hemesis, hematochezia, vomiting, or acute GI distress Musculoskeletal: Denies any acute onset joint swelling, redness, loss of ROM, or weakness Neurological: No reported episodes of acute onset apraxia, aphasia, dysarthria, agnosia, amnesia, paralysis, loss of coordination, or loss of consciousness  Medication Review  Calcium Citrate, Multiple Vitamins-Minerals, Vitamin D3, acetaminophen, amiodarone, apixaban, fluticasone, furosemide, levothyroxine, traMADol, and vitamin B-12  History Review  Allergy: Ms. Gonsoulin is allergic to hydrocodone, ciprofloxacin, and latex. Drug: Ms. Zucker  reports no history of drug use. Alcohol:  reports current alcohol use of about 1.0 standard drink per week. Tobacco:  reports that she has never smoked. She has never used smokeless tobacco. Social: Ms. Demario  reports that she has never smoked. She has never used smokeless tobacco. She reports current alcohol use of about 1.0 standard drink per week. She reports that she does not use drugs. Medical:  has a past medical history of Acute postoperative pain (12/17/2016), Anemia (11/10/2015), Arthritis, Atrial fibrillation ([place]), CHF (congestive heart failure) ([place]), Chickenpox, Chronic abdominal pain (01/16/2014), Closed Colles'  fracture (12/17/2016), Degenerative arthritis of hip (08/19/2014), Degenerative arthritis of lumbar spine (11/18/2013), Depression, Diverticulitis, GERD (gastroesophageal reflux disease), Heart murmur, Heart rate slow, Hyperlipidemia, Hypertension, Hypothyroid, Neuritis or radiculitis due to rupture of lumbar intervertebral disc (11/18/2013), Skin cancer (2016 ?), Symptomatic anemia (11/09/2015), and Trochanteric bursitis of right  hip (11/18/2013). Surgical: Ms. Gotwalt  has a past surgical history that includes Cataract extraction; Partial hysterectomy; Appendectomy; and Tonsillectomy. Family: family history includes Breast cancer in her mother and sister; Heart disease in an other family member; Hypertension in an other family member; Multiple sclerosis in her sister; Stroke in her maternal grandmother and sister.  Laboratory Chemistry Profile   Renal Lab Results  Component Value Date   BUN 17 06/28/2021   CREATININE 0.70 06/28/2021   LABCREA 90 01/11/2020   BCR 24 (H) 04/26/2020   GFR 74.05 06/28/2021   GFRAA >60 12/18/2019   GFRNONAA >60 12/18/2019    Hepatic Lab Results  Component Value Date   AST 21 06/28/2021   ALT 9 06/28/2021   ALBUMIN 3.9 06/28/2021   ALKPHOS 42 06/28/2021   LIPASE 181 11/02/2013    Electrolytes Lab Results  Component Value Date   NA 136 06/28/2021   K 4.1 06/28/2021   CL 98 06/28/2021   CALCIUM 9.1 06/28/2021   MG 2.0 03/30/2019   PHOS 3.1 04/26/2020    Bone Lab Results  Component Value Date   VD25OH 28.73 (L) 01/11/2020   25OHVITD1 14 (L) 03/30/2019   25OHVITD2 <1.0 03/30/2019   25OHVITD3 14 03/30/2019    Inflammation (CRP: Acute Phase) (ESR: Chronic Phase) Lab Results  Component Value Date   CRP <1 03/30/2019   ESRSEDRATE 50 (H) 03/30/2019         Note: Above Lab results reviewed.  Recent Imaging Review  DG Ribs Unilateral Right CLINICAL DATA:  Right ribcage trauma.  EXAM: RIGHT RIBS - 2 VIEW  COMPARISON:  PA Lat chest 09/08/2018  FINDINGS: No displaced fracture or other bone lesions are seen involving the ribs. Moderate generalized osteopenia is again noted. Mild thoracic dextroscoliosis.  The lungs show chronic changes without infiltrate. Mild cardiomegaly without CHF.  There is aortic tortuosity with patchy calcification of the transverse segment. Low inspiration.  IMPRESSION: Osteopenia with no appreciable displaced rib fractures or  other acute chest findings.  Electronically Signed   By: Telford Nab M.D.   On: 06/29/2021 20:39 Note: Reviewed        Physical Exam  General appearance: Well nourished, well developed, and well hydrated. In no apparent acute distress Mental status: Alert, oriented x 3 (person, place, & time)       Respiratory: No evidence of acute respiratory distress Eyes: PERLA Vitals: BP 118/69    Pulse 84    Temp (!) 97 F (36.1 C)    Resp 16    Ht 4' 10"  (1.473 m)    Wt 106 lb (48.1 kg)    SpO2 100%    BMI 22.15 kg/m  BMI: Estimated body mass index is 22.15 kg/m as calculated from the following:   Height as of this encounter: 4' 10"  (1.473 m).   Weight as of this encounter: 106 lb (48.1 kg). Ideal: Patient must be at least 60 in tall to calculate ideal body weight  Assessment   Status Diagnosis  Controlled Controlled Controlled 1. Trigger point with back pain (Right)   2. Chronic low back pain (1ry area of Pain) (Right)   3. DDD (degenerative disc disease), lumbar  4. Grade 1 Anterolisthesis of L3/L4 and L4/L5 (5 mm)   5. Lumbar facet arthropathy   6. Lumbar facet hypertrophy (Multilevel) (Bilateral)   7. Lumbar spondylosis   8. Osteoarthritis of hips (Bilateral)   9. Chronic pain syndrome   10. Pharmacologic therapy   11. Chronic use of opiate for therapeutic purpose      Updated Problems: Problem  Trigger point with back pain (Right)    Plan of Care  Problem-specific:  No problem-specific Assessment & Plan notes found for this encounter.  Ms. HARLIE BUENING has a current medication list which includes the following long-term medication(s): apixaban, calcium citrate, fluticasone, levothyroxine, and [START ON 08/06/2021] tramadol.  Pharmacotherapy (Medications Ordered): Meds ordered this encounter  Medications   traMADol (ULTRAM) 50 MG tablet    Sig: Take 2 tablets (100 mg total) by mouth every 6 (six) hours. Each refill must last 30 days    Dispense:  240 tablet     Refill:  5    DO NOT: delete (not duplicate); no partial-fill (will deny script to complete), no refill request (F/U required). DISPENSE: 1 day early if closed on fill date. WARN: No CNS-depressants within 8 hrs of med.   ropivacaine (PF) 2 mg/mL (0.2%) (NAROPIN) injection 4 mL   triamcinolone acetonide (KENALOG-40) injection 40 mg   Orders:  Orders Placed This Encounter  Procedures   TRIGGER POINT INJECTION    Scheduling Instructions:     Area: Lower Back     Side: Right     Sedation: No Sedation.     Timeframe: Today    Order Specific Question:   Where will this procedure be performed?    Answer:   [facility]   Provide equipment / supplies at bedside    "Block Tray" (Disposable   single use) Needle type: SpinalRegular Amount/quantity: 1 Size: Short(1.5-inch) Gauge: 25G    Standing Status:   Standing    Number of Occurrences:   1    Order Specific Question:   Specify    Answer:   Block Tray   Follow-up plan:   Return in about 2 weeks (around 08/03/2021) for Proc-day (T,Th), (VV), (PPE).     Interventional Therapies  Risk   Complexity Considerations:   Estimated body mass index is 22.15 kg/m as calculated from the following:   Height as of this encounter: 4' 10"  (1.473 m).   Weight as of this encounter: 106 lb (48.1 kg). NOTE: Eliquis Anticoagulation (Stop:3 days   Restart: 6 hrs)  Advanced age  Latex allergy   Planned   Pending:      Under consideration:   Palliative right lumbar facet RFA #3   Completed:   Palliative/therapeutic caudal ESI x1 (07/21/2019) (100/100/90/90)  Palliative right lumbar facet (L2, L3, L4, L5, & S1) MBB x9 (09/07/2020) (100/100/90/75)  Palliative/Therapeutic right lumbar facet RFA x3 (01/28/21) (2nd: 100/100/100/90)  Palliative right SI Blk x4 (01/24/2021) (100/100/0/0)  Palliative/therapeutic right SI joint RFA x1 (03/27/2018) (100/90/0)  Diagnostic/therapeutic right erector spinal muscle MNB/TPI x1 (07/20/2021)    Therapeutic    Palliative (PRN) options:   Palliative/therapeutic caudal ESI #2  Palliative right lumbar facet block #10  Palliative/Therapeutic right lumbar facet RFA #3     Recent Visits Date Type Provider Dept  07/18/21 Appointment Milinda Pointer, MD [facility]  Showing recent visits within past 90 days and meeting all other requirements Today's Visits Date Type Provider Dept  07/20/21 Office Visit Milinda Pointer, MD [facility]  Showing today's visits and meeting all other requirements Future Appointments Date Type Provider Dept  08/03/21 Appointment Milinda Pointer, MD [facility]  Showing future appointments within next 90 days and meeting all other requirements  I discussed the assessment and treatment plan with the patient. The patient was provided an opportunity to ask questions and all were answered. The patient agreed with the plan and demonstrated an understanding of the instructions.  Patient advised to call back or seek an in-person evaluation if the symptoms or condition worsens.  Duration of encounter: 30 minutes.  Note by: Gaspar Cola, MD Date: 07/20/2021; Time: 2:44 PM

## 2021-07-20 NOTE — Patient Instructions (Addendum)
____________________________________________________________________________________________  Virtual Visits   What is a "Virtual Visit"? It is a Metallurgist (medical visit) that takes place on real time (NOT TEXT or E-MAIL) over the telephone or computer device (desktop, laptop, tablet, smart phone, etc.). It allows for more location flexibility between the patient and the healthcare provider.  Who decides when these types of visits will be used? The physician.  Who is eligible for these types of visits? Only those patients that can be reliably reached over the telephone.  What do you mean by reliably? We do not have time to call everyone multiple times, therefore those that tend to screen calls and then call back later are not suitable candidates for this system. We understand how people are reluctant to pickup on "unknown" calls, therefore, we suggest adding our telephone numbers to your list of "CONTACT(s)". This way, you should be able to readily identify our calls when you receive one. All of our numbers are available below.   Who is not eligible? This option is not available for medication management encounters, specially for controlled substances. Patients on pain medications that fall under the category of controlled substances have to come in for "Face-to-Face" encounters. This is required for mandatory monitoring of these substances. You may be asked to provide a sample for an unannounced urine drug screening test (UDS), and we will need to count your pain pills. Not bringing your pills to be counted may result in no refill. Obviously, neither one of these can be done over the phone.  When will this type of visits be used? You can request a virtual visit whenever you are physically unable to attend a regular appointment. The decision will be made by the physician (or healthcare provider) on a case by case basis.   At what time will I be called? This is an  excellent question. The providers will try to call you whenever they have time available. Do not expect to be called at any specific time. The secretaries will assign you a time for your virtual visit appointment, but this is done simply to keep a list of those patients that need to be called, but not for the purpose of keeping a time schedule. Be advised that the call may come in anytime during the day, between the hours of 8:00 AM and 8::00 PM, depending on provider availability. We do understand that the system is not perfect. If you are unable to be available that day on a moments notice, then request an "in-person" appointment rather than a "virtual visit".  Can I request my medication visits to be "Virtual"? Yes you may request it, but the decision is entirely up to the healthcare provider. Control substances require specific monitoring that requires Face-to-Face encounters. The number of encounters  and the extent of the monitoring is determined on a case by case basis.  Add a new contact to your smart phone and label it "PAIN CLINIC" Under this contact add the following numbers: Main: [phone] (Official Contact Number) Nurses: [phone] (These are outgoing only calling systems. Do not call this number.) Dr. Dossie Arbour: [phone] or [phone] (Outgoing calls only. Do not call this number.)  ____________________________________________________________________________________________  ____________________________________________________________________________________________  Post-Procedure Discharge Instructions  Instructions: Apply ice:  Purpose: This will minimize any swelling and discomfort after procedure.  When: Day of procedure, as soon as you get home. How: Fill a plastic sandwich bag with crushed ice. Cover it with a small towel and apply to injection  site. How long: (15 min on, 15 min off) Apply for 15 minutes then remove x 15 minutes.  Repeat sequence on day of  procedure, until you go to bed. Apply heat:  Purpose: To treat any soreness and discomfort from the procedure. When: Starting the next day after the procedure. How: Apply heat to procedure site starting the day following the procedure. How long: May continue to repeat daily, until discomfort goes away. Food intake: Start with clear liquids (like water) and advance to regular food, as tolerated.  Physical activities: Keep activities to a minimum for the first 8 hours after the procedure. After that, then as tolerated. Driving: If you have received any sedation, be responsible and do not drive. You are not allowed to drive for 24 hours after having sedation. Blood thinner: (Applies only to those taking blood thinners) You may restart your blood thinner 6 hours after your procedure. Insulin: (Applies only to Diabetic patients taking insulin) As soon as you can eat, you may resume your normal dosing schedule. Infection prevention: Keep procedure site clean and dry. Shower daily and clean area with soap and water. Post-procedure Pain Diary: Extremely important that this be done correctly and accurately. Recorded information will be used to determine the next step in treatment. For the purpose of accuracy, follow these rules: Evaluate only the area treated. Do not report or include pain from an untreated area. For the purpose of this evaluation, ignore all other areas of pain, except for the treated area. After your procedure, avoid taking a long nap and attempting to complete the pain diary after you wake up. Instead, set your alarm clock to go off every hour, on the hour, for the initial 8 hours after the procedure. Document the duration of the numbing medicine, and the relief you are getting from it. Do not go to sleep and attempt to complete it later. It will not be accurate. If you received sedation, it is likely that you were given a medication that may cause amnesia. Because of this, completing the  diary at a later time may cause the information to be inaccurate. This information is needed to plan your care. Follow-up appointment: Keep your post-procedure follow-up evaluation appointment after the procedure (usually 2 weeks for most procedures, 6 weeks for radiofrequencies). DO NOT FORGET to bring you pain diary with you.   Expect: (What should I expect to see with my procedure?) From numbing medicine (AKA: Local Anesthetics): Numbness or decrease in pain. You may also experience some weakness, which if present, could last for the duration of the local anesthetic. Onset: Full effect within 15 minutes of injected. Duration: It will depend on the type of local anesthetic used. On the average, 1 to 8 hours.  From steroids (Applies only if steroids were used): Decrease in swelling or inflammation. Once inflammation is improved, relief of the pain will follow. Onset of benefits: Depends on the amount of swelling present. The more swelling, the longer it will take for the benefits to be seen. In some cases, up to 10 days. Duration: Steroids will stay in the system x 2 weeks. Duration of benefits will depend on multiple posibilities including persistent irritating factors. Side-effects: If present, they may typically last 2 weeks (the duration of the steroids). Frequent: Cramps (if they occur, drink Gatorade and take over-the-counter Magnesium 450-500 mg once to twice a day); water retention with temporary weight gain; increases in blood sugar; decreased immune system response; increased appetite. Occasional: Facial flushing (red, warm  cheeks); mood swings; menstrual changes. Uncommon: Long-term decrease or suppression of natural hormones; bone thinning. (These are more common with higher doses or more frequent use. This is why we prefer that our patients avoid having any injection therapies in other practices.)  Very Rare: Severe mood changes; psychosis; aseptic necrosis. From procedure: Some  discomfort is to be expected once the numbing medicine wears off. This should be minimal if ice and heat are applied as instructed.  Call if: (When should I call?) You experience numbness and weakness that gets worse with time, as opposed to wearing off. New onset bowel or bladder incontinence. (Applies only to procedures done in the spine)  Emergency Numbers: Durning business hours (Monday - Thursday, 8:00 AM - 4:00 PM) (Friday, 9:00 AM - 12:00 Noon): [phone] After hours: [phone] NOTE: If you are having a problem and are unable connect with, or to talk to a provider, then go to your nearest urgent care or emergency department. If the problem is serious and urgent, please call 911. ____________________________________________________________________________________________  Post-procedure Information What to expect: Most procedures involve the use of a local anesthetic (numbing medicine), and a steroid (anti-inflammatory medicine).  The local anesthetics may cause temporary numbness and weakness of the legs or arms, depending on the location of the block. This numbness/weakness may last 4-6 hours, depending on the local anesthetic used. In rare instances, it can last up to 24 hours. While numb, you must be very careful not to injure the extremity.  After any procedure, you could expect the pain to get better within 15-20 minutes. This relief is temporary and may last 4-6 hours. Once the local anesthetics wears off, you could experience discomfort, possibly more than usual, for up to 10 (ten) days. In the case of radiofrequencies, it may last up to 6 weeks. Surgeries may take up to 8 weeks for the healing process. The discomfort is due to the irritation caused by needles going through skin and muscle. To minimize the discomfort, we recommend using ice the first day, and heat from then on. The ice should be applied for 15 minutes on, and 15 minutes off. Keep repeating this cycle until  bedtime. Avoid applying the ice directly to the skin, to prevent frostbite. Heat should be used daily, until the pain improves (4-10 days). Be careful not to burn yourself.  Occasionally you may experience muscle spasms or cramps. These occur as a consequence of the irritation caused by the needle sticks to the muscle and the blood that will inevitably be lost into the surrounding muscle tissue. Blood tends to be very irritating to tissues, which tend to react by going into spasm. These spasms may start the same day of your procedure, but they may also take days to develop. This late onset type of spasm or cramp is usually caused by electrolyte imbalances triggered by the steroids, at the level of the kidney. Cramps and spasms tend to respond well to muscle relaxants, multivitamins (some are triggered by the procedure, but may have their origins in vitamin deficiencies), and "Gatorade", or any sports drinks that can replenish any electrolyte imbalances. (If you are a diabetic, ask your pharmacist to get you a sugar-free brand.) Warm showers or baths may also be helpful. Stretching exercises are highly recommended. General Instructions:  Be alert for signs of possible infection: redness, swelling, heat, red streaks, elevated temperature, and/or fever. These typically appear 4 to 6 days after the procedure. Immediately notify your doctor if you experience unusual   bleeding, difficulty breathing, or loss of bowel or bladder control. If you experience increased pain, do not increase your pain medicine intake, unless instructed by your pain physician. Post-Procedure Care:  Be careful in moving about. Muscle spasms in the area of the injection may occur. Applying ice or heat to the area is often helpful. The incidence of spinal headaches after epidural injections ranges between 1.4% and 6%. If you develop a headache that does not seem to respond to conservative therapy, please let your physician know. This can be  treated with an epidural blood patch.   Post-procedure numbness or redness is to be expected, however it should average 4 to 6 hours. If numbness and weakness of your extremities begins to develop 4 to 6 hours after your procedure, and is felt to be progressing and worsening, immediately contact your physician.   Diet:  If you experience nausea, do not eat until this sensation goes away. If you had a "Stellate Ganglion Block" for upper extremity "Reflex Sympathetic Dystrophy", do not eat or drink until your hoarseness goes away. In any case, always start with liquids first and if you tolerate them well, then slowly progress to more solid foods. Activity:  For the first 4 to 6 hours after the procedure, use caution in moving about as you may experience numbness and/or weakness. Use caution in cooking, using household electrical appliances, and climbing steps. If you need to reach your Doctor call our office: [phone] Monday-Thursday 8:00 am - 4:00 PM    Fridays: Closed     In case of an emergency: In case of emergency, call 911 or go to the nearest emergency room and have the physician there call us.  Interpretation of Procedure Every nerve block has two components: a diagnostic component, and a treatment component. Unrealistic expectations are the most common causes of "perceived failure".  In a perfect world, a single nerve block should be able to completely and permanently eliminate the pain. Sadly, the world is not perfect.  Most pain management nerve blocks are performed using local anesthetics and steroids. Steroids are responsible for any long-term benefit that you may experience. Their purpose is to decrease any chronic swelling that may exist in the area. Steroids begin to work immediately after being injected. However, most patients will not experience any benefits until 5 to 10 days after the injection, when the swelling has come down to the point where they can tell a difference.  Steroids will only help if there is swelling to be treated. As such, they can assist with the diagnosis. If effective, they suggest an inflammatory component to the pain, and if ineffective, they rule out inflammation as the main cause or component of the problem. If the problem is one of mechanical compression, you will get no benefit from those steroids.   In the case of local anesthetics, they have a crucial role in the diagnosis of your condition. Most will begin to work within15 to 20 minutes after injection. The duration will depend on the type used (short- vs. Long-acting). It is of outmost importance that patients keep tract of their pain, after the procedure. To assist with this matter, a "Post-procedure Pain Diary" is provided. Make sure to complete it and to bring it back to your follow-up appointment.  As long as the patient keeps accurate, detailed records of their symptoms after every procedure, and returns to have those interpreted, every procedure will provide us with invaluable information. Even a block that does not   provide the patient with any relief, will always provide us with information about the mechanism and the origin of the pain. The only time a nerve block can be considered a waste of time is when patients do not keep track of the results, or do not keep their post-procedure appointment.  Reporting the results back to your physician The Pain Score  Pain is a subjective complaint. It cannot be seen, touched, or measured. We depend entirely on the patient's report of the pain in order to assess your condition and treatment. To evaluate the pain, we use a pain scale, where "0" means "No Pain", and a "10" is "the worst possible pain that you can even imagine" (i.e. something like been eaten alive by a shark or being torn apart by a lion).   You will frequently be asked to rate your pain. Please be as accurate, remember that medical decisions will be based on your responses. Please  do not rate your pain above a 10. Doing so is actually interpreted as "symptom magnification" (exaggeration), as well as lack of understanding with regards to the scale. To put this into perspective, when you tell us that your pain is at a 10 (ten), what you are saying is that there is nothing we can do to make this pain any worse. (Carefully think about that.)  

## 2021-07-21 DIAGNOSIS — M5136 Other intervertebral disc degeneration, lumbar region: Secondary | ICD-10-CM | POA: Diagnosis not present

## 2021-07-21 DIAGNOSIS — Z9181 History of falling: Secondary | ICD-10-CM | POA: Diagnosis not present

## 2021-07-21 DIAGNOSIS — G8929 Other chronic pain: Secondary | ICD-10-CM | POA: Diagnosis not present

## 2021-07-21 DIAGNOSIS — M546 Pain in thoracic spine: Secondary | ICD-10-CM | POA: Diagnosis not present

## 2021-07-21 DIAGNOSIS — D649 Anemia, unspecified: Secondary | ICD-10-CM | POA: Diagnosis not present

## 2021-07-21 DIAGNOSIS — I110 Hypertensive heart disease with heart failure: Secondary | ICD-10-CM | POA: Diagnosis not present

## 2021-07-21 DIAGNOSIS — M1610 Unilateral primary osteoarthritis, unspecified hip: Secondary | ICD-10-CM | POA: Diagnosis not present

## 2021-07-21 DIAGNOSIS — I051 Rheumatic mitral insufficiency: Secondary | ICD-10-CM | POA: Diagnosis not present

## 2021-07-21 DIAGNOSIS — Z6822 Body mass index (BMI) 22.0-22.9, adult: Secondary | ICD-10-CM | POA: Diagnosis not present

## 2021-07-21 DIAGNOSIS — M545 Low back pain, unspecified: Secondary | ICD-10-CM | POA: Diagnosis not present

## 2021-07-21 DIAGNOSIS — M810 Age-related osteoporosis without current pathological fracture: Secondary | ICD-10-CM | POA: Diagnosis not present

## 2021-07-21 DIAGNOSIS — F32A Depression, unspecified: Secondary | ICD-10-CM | POA: Diagnosis not present

## 2021-07-21 DIAGNOSIS — M47817 Spondylosis without myelopathy or radiculopathy, lumbosacral region: Secondary | ICD-10-CM | POA: Diagnosis not present

## 2021-07-21 DIAGNOSIS — K219 Gastro-esophageal reflux disease without esophagitis: Secondary | ICD-10-CM | POA: Diagnosis not present

## 2021-07-21 DIAGNOSIS — E039 Hypothyroidism, unspecified: Secondary | ICD-10-CM | POA: Diagnosis not present

## 2021-07-21 DIAGNOSIS — R7303 Prediabetes: Secondary | ICD-10-CM | POA: Diagnosis not present

## 2021-07-21 DIAGNOSIS — E785 Hyperlipidemia, unspecified: Secondary | ICD-10-CM | POA: Diagnosis not present

## 2021-07-21 DIAGNOSIS — I509 Heart failure, unspecified: Secondary | ICD-10-CM | POA: Diagnosis not present

## 2021-07-21 DIAGNOSIS — I4891 Unspecified atrial fibrillation: Secondary | ICD-10-CM | POA: Diagnosis not present

## 2021-07-24 ENCOUNTER — Telehealth: Payer: Self-pay | Admitting: Pain Medicine

## 2021-07-24 DIAGNOSIS — G8929 Other chronic pain: Secondary | ICD-10-CM | POA: Diagnosis not present

## 2021-07-24 DIAGNOSIS — I110 Hypertensive heart disease with heart failure: Secondary | ICD-10-CM | POA: Diagnosis not present

## 2021-07-24 DIAGNOSIS — Z6822 Body mass index (BMI) 22.0-22.9, adult: Secondary | ICD-10-CM | POA: Diagnosis not present

## 2021-07-24 DIAGNOSIS — M810 Age-related osteoporosis without current pathological fracture: Secondary | ICD-10-CM | POA: Diagnosis not present

## 2021-07-24 DIAGNOSIS — M47817 Spondylosis without myelopathy or radiculopathy, lumbosacral region: Secondary | ICD-10-CM | POA: Diagnosis not present

## 2021-07-24 DIAGNOSIS — E785 Hyperlipidemia, unspecified: Secondary | ICD-10-CM | POA: Diagnosis not present

## 2021-07-24 DIAGNOSIS — K219 Gastro-esophageal reflux disease without esophagitis: Secondary | ICD-10-CM | POA: Diagnosis not present

## 2021-07-24 DIAGNOSIS — D649 Anemia, unspecified: Secondary | ICD-10-CM | POA: Diagnosis not present

## 2021-07-24 DIAGNOSIS — E039 Hypothyroidism, unspecified: Secondary | ICD-10-CM | POA: Diagnosis not present

## 2021-07-24 DIAGNOSIS — Z9181 History of falling: Secondary | ICD-10-CM | POA: Diagnosis not present

## 2021-07-24 DIAGNOSIS — M5136 Other intervertebral disc degeneration, lumbar region: Secondary | ICD-10-CM | POA: Diagnosis not present

## 2021-07-24 DIAGNOSIS — M545 Low back pain, unspecified: Secondary | ICD-10-CM | POA: Diagnosis not present

## 2021-07-24 DIAGNOSIS — M1610 Unilateral primary osteoarthritis, unspecified hip: Secondary | ICD-10-CM | POA: Diagnosis not present

## 2021-07-24 DIAGNOSIS — I051 Rheumatic mitral insufficiency: Secondary | ICD-10-CM | POA: Diagnosis not present

## 2021-07-24 DIAGNOSIS — R7303 Prediabetes: Secondary | ICD-10-CM | POA: Diagnosis not present

## 2021-07-24 DIAGNOSIS — I509 Heart failure, unspecified: Secondary | ICD-10-CM | POA: Diagnosis not present

## 2021-07-24 DIAGNOSIS — F32A Depression, unspecified: Secondary | ICD-10-CM | POA: Diagnosis not present

## 2021-07-24 DIAGNOSIS — I4891 Unspecified atrial fibrillation: Secondary | ICD-10-CM | POA: Diagnosis not present

## 2021-07-24 DIAGNOSIS — M546 Pain in thoracic spine: Secondary | ICD-10-CM | POA: Diagnosis not present

## 2021-07-24 NOTE — Telephone Encounter (Signed)
Called patient and stated that the injection didn't work. Instructed her that she needs to give the steroid a little more time to work. Patient with understanding.

## 2021-07-24 NOTE — Telephone Encounter (Signed)
Wants someone to call her regarding her procedure she had Thurs.

## 2021-07-28 DIAGNOSIS — I4891 Unspecified atrial fibrillation: Secondary | ICD-10-CM | POA: Diagnosis not present

## 2021-07-28 DIAGNOSIS — D649 Anemia, unspecified: Secondary | ICD-10-CM | POA: Diagnosis not present

## 2021-07-28 DIAGNOSIS — M1610 Unilateral primary osteoarthritis, unspecified hip: Secondary | ICD-10-CM | POA: Diagnosis not present

## 2021-07-28 DIAGNOSIS — M545 Low back pain, unspecified: Secondary | ICD-10-CM | POA: Diagnosis not present

## 2021-07-28 DIAGNOSIS — I051 Rheumatic mitral insufficiency: Secondary | ICD-10-CM | POA: Diagnosis not present

## 2021-07-28 DIAGNOSIS — Z9181 History of falling: Secondary | ICD-10-CM | POA: Diagnosis not present

## 2021-07-28 DIAGNOSIS — F32A Depression, unspecified: Secondary | ICD-10-CM | POA: Diagnosis not present

## 2021-07-28 DIAGNOSIS — K219 Gastro-esophageal reflux disease without esophagitis: Secondary | ICD-10-CM | POA: Diagnosis not present

## 2021-07-28 DIAGNOSIS — G8929 Other chronic pain: Secondary | ICD-10-CM | POA: Diagnosis not present

## 2021-07-28 DIAGNOSIS — M810 Age-related osteoporosis without current pathological fracture: Secondary | ICD-10-CM | POA: Diagnosis not present

## 2021-07-28 DIAGNOSIS — E785 Hyperlipidemia, unspecified: Secondary | ICD-10-CM | POA: Diagnosis not present

## 2021-07-28 DIAGNOSIS — M546 Pain in thoracic spine: Secondary | ICD-10-CM | POA: Diagnosis not present

## 2021-07-28 DIAGNOSIS — M5136 Other intervertebral disc degeneration, lumbar region: Secondary | ICD-10-CM | POA: Diagnosis not present

## 2021-07-28 DIAGNOSIS — E039 Hypothyroidism, unspecified: Secondary | ICD-10-CM | POA: Diagnosis not present

## 2021-07-28 DIAGNOSIS — I110 Hypertensive heart disease with heart failure: Secondary | ICD-10-CM | POA: Diagnosis not present

## 2021-07-28 DIAGNOSIS — R7303 Prediabetes: Secondary | ICD-10-CM | POA: Diagnosis not present

## 2021-07-28 DIAGNOSIS — M47817 Spondylosis without myelopathy or radiculopathy, lumbosacral region: Secondary | ICD-10-CM | POA: Diagnosis not present

## 2021-07-28 DIAGNOSIS — Z6822 Body mass index (BMI) 22.0-22.9, adult: Secondary | ICD-10-CM | POA: Diagnosis not present

## 2021-07-28 DIAGNOSIS — I509 Heart failure, unspecified: Secondary | ICD-10-CM | POA: Diagnosis not present

## 2021-08-01 DIAGNOSIS — E039 Hypothyroidism, unspecified: Secondary | ICD-10-CM | POA: Diagnosis not present

## 2021-08-01 DIAGNOSIS — I509 Heart failure, unspecified: Secondary | ICD-10-CM | POA: Diagnosis not present

## 2021-08-01 DIAGNOSIS — K219 Gastro-esophageal reflux disease without esophagitis: Secondary | ICD-10-CM | POA: Diagnosis not present

## 2021-08-01 DIAGNOSIS — D649 Anemia, unspecified: Secondary | ICD-10-CM | POA: Diagnosis not present

## 2021-08-01 DIAGNOSIS — I051 Rheumatic mitral insufficiency: Secondary | ICD-10-CM | POA: Diagnosis not present

## 2021-08-01 DIAGNOSIS — M5136 Other intervertebral disc degeneration, lumbar region: Secondary | ICD-10-CM | POA: Diagnosis not present

## 2021-08-01 DIAGNOSIS — M810 Age-related osteoporosis without current pathological fracture: Secondary | ICD-10-CM | POA: Diagnosis not present

## 2021-08-01 DIAGNOSIS — M546 Pain in thoracic spine: Secondary | ICD-10-CM | POA: Diagnosis not present

## 2021-08-01 DIAGNOSIS — I110 Hypertensive heart disease with heart failure: Secondary | ICD-10-CM | POA: Diagnosis not present

## 2021-08-01 DIAGNOSIS — G8929 Other chronic pain: Secondary | ICD-10-CM | POA: Diagnosis not present

## 2021-08-01 DIAGNOSIS — M1610 Unilateral primary osteoarthritis, unspecified hip: Secondary | ICD-10-CM | POA: Diagnosis not present

## 2021-08-01 DIAGNOSIS — Z9181 History of falling: Secondary | ICD-10-CM | POA: Diagnosis not present

## 2021-08-01 DIAGNOSIS — F32A Depression, unspecified: Secondary | ICD-10-CM | POA: Diagnosis not present

## 2021-08-01 DIAGNOSIS — M47817 Spondylosis without myelopathy or radiculopathy, lumbosacral region: Secondary | ICD-10-CM | POA: Diagnosis not present

## 2021-08-01 DIAGNOSIS — E785 Hyperlipidemia, unspecified: Secondary | ICD-10-CM | POA: Diagnosis not present

## 2021-08-01 DIAGNOSIS — M545 Low back pain, unspecified: Secondary | ICD-10-CM | POA: Diagnosis not present

## 2021-08-01 DIAGNOSIS — R7303 Prediabetes: Secondary | ICD-10-CM | POA: Diagnosis not present

## 2021-08-01 DIAGNOSIS — I4891 Unspecified atrial fibrillation: Secondary | ICD-10-CM | POA: Diagnosis not present

## 2021-08-01 DIAGNOSIS — Z6822 Body mass index (BMI) 22.0-22.9, adult: Secondary | ICD-10-CM | POA: Diagnosis not present

## 2021-08-02 ENCOUNTER — Encounter: Payer: Medicare Other | Admitting: Pain Medicine

## 2021-08-03 ENCOUNTER — Ambulatory Visit: Payer: Medicare Other | Attending: Pain Medicine | Admitting: Pain Medicine

## 2021-08-03 ENCOUNTER — Other Ambulatory Visit: Payer: Self-pay

## 2021-08-03 DIAGNOSIS — M549 Dorsalgia, unspecified: Secondary | ICD-10-CM | POA: Diagnosis not present

## 2021-08-03 DIAGNOSIS — M4310 Spondylolisthesis, site unspecified: Secondary | ICD-10-CM | POA: Diagnosis not present

## 2021-08-03 DIAGNOSIS — M5136 Other intervertebral disc degeneration, lumbar region: Secondary | ICD-10-CM | POA: Diagnosis not present

## 2021-08-03 DIAGNOSIS — M5134 Other intervertebral disc degeneration, thoracic region: Secondary | ICD-10-CM | POA: Diagnosis not present

## 2021-08-03 DIAGNOSIS — M8088XA Other osteoporosis with current pathological fracture, vertebra(e), initial encounter for fracture: Secondary | ICD-10-CM

## 2021-08-03 DIAGNOSIS — M160 Bilateral primary osteoarthritis of hip: Secondary | ICD-10-CM

## 2021-08-03 DIAGNOSIS — R413 Other amnesia: Secondary | ICD-10-CM | POA: Insufficient documentation

## 2021-08-03 DIAGNOSIS — M47816 Spondylosis without myelopathy or radiculopathy, lumbar region: Secondary | ICD-10-CM

## 2021-08-03 DIAGNOSIS — Z7901 Long term (current) use of anticoagulants: Secondary | ICD-10-CM | POA: Diagnosis not present

## 2021-08-03 DIAGNOSIS — G8929 Other chronic pain: Secondary | ICD-10-CM

## 2021-08-03 DIAGNOSIS — M545 Low back pain, unspecified: Secondary | ICD-10-CM | POA: Diagnosis not present

## 2021-08-03 DIAGNOSIS — G894 Chronic pain syndrome: Secondary | ICD-10-CM

## 2021-08-03 NOTE — Progress Notes (Signed)
Patient: Amanda Soto  Service Category: E/M  Provider: Gaspar Cola, MD  DOB: August 02, 1924  DOS: 08/03/2021  Location: Office  MRN: [id number]  Setting: Ambulatory outpatient  Referring Provider: [name], MD  Type: Established Patient  Specialty: Interventional Pain Management  PCP: [name], MD  Location: Remote location  Delivery: TeleHealth     Virtual Encounter - Pain Management PROVIDER NOTE: Information contained herein reflects review and annotations entered in association with encounter. Interpretation of such information and data should be left to medically-trained personnel. Information provided to patient can be located elsewhere in the medical record under "Patient Instructions". Document created using STT-dictation technology, any transcriptional errors that may result from process are unintentional.    Contact & Pharmacy Preferred: [place]: [phone] (home) Mobile: [phone] (mobile) E-mail: [name]  [id number] [place] [id number] [id number]: [phone]: [phone]  Pre-screening  [name] offered "in-person" vs "virtual" encounter. She indicated preferring virtual for this encounter.   Reason COVID-19*   Social distancing based on CDC and AMA recommendations.   I contacted Amanda NAGELEon 08/03/2021 via telephone.      I clearly identified myself as FGaspar Cola MD. I verified that I was speaking with the correct person using two identifiers (Name: PBILLEE BALCERZAK and date of birth: [id number].  Consent I sought verbal advanced consent from PKatheren Pullerfor virtual visit interactions. I informed Amanda Soto of possible security and privacy concerns, risks, and limitations associated with providing "not-in-person" medical evaluation and management services. I also informed Amanda Soto of the availability of "in-person" appointments. Finally, I informed  her that there would be a charge for the virtual visit and that she could be  personally, fully or partially, financially responsible for it. Ms. HBernalesexpressed understanding and agreed to proceed.   Historic Elements   Ms. PKEISHANA KLINGERis a 88y.o. year old, female patient evaluated today after our last contact on 07/24/2021. Amanda Soto has a past medical history of Acute postoperative pain (12/17/2016), Anemia (11/10/2015), Arthritis, Atrial fibrillation ([place], CHF (congestive heart failure) ([place], Chickenpox, Chronic abdominal pain (01/16/2014), Closed Colles' fracture (12/17/2016), Degenerative arthritis of hip (08/19/2014), Degenerative arthritis of lumbar spine (11/18/2013), Depression, Diverticulitis, GERD (gastroesophageal reflux disease), Heart murmur, Heart rate slow, Hyperlipidemia, Hypertension, Hypothyroid, Neuritis or radiculitis due to rupture of lumbar intervertebral disc (11/18/2013), Skin cancer (2016 ?), Symptomatic anemia (11/09/2015), and Trochanteric bursitis of right hip (11/18/2013). She also  has a past surgical history that includes Cataract extraction; Partial hysterectomy; Appendectomy; and Tonsillectomy. Ms. HSleeperhas a current medication list which includes the following prescription(s): acetaminophen, amiodarone, apixaban, calcium citrate, vitamin d3, fluticasone, furosemide, levothyroxine, multiple vitamins-minerals, [START ON 08/06/2021] tramadol, and vitamin b-12. She  reports that she has never smoked. She has never used smokeless tobacco. She reports current alcohol use of about 1.0 standard drink per week. She reports that she does not use drugs. Ms. HStrickis allergic to hydrocodone, ciprofloxacin, and latex.   HPI  Today, she is being contacted for a post-procedure assessment.  In treating this patient since 11/14/2015, it has become very clear to me that she has a memory impairment, to be expected at age 88 which makes it very difficult for uKoreato attain adequate feedback from the  procedures that we have been doing for her.  To start with, she has unrealistic expectations that any type of treatment  that we do will be providing her with permanent relief of her pain.  More important to also is to get adequate and accurate information regarding the effects of the local anesthetics since clearly a lot of the problems that she is experiencing are secondary to mechanical changes in her spine secondary to degeneration.  An example of this is the procedure done on 07/20/2021 which we clearly documented that it provided her with 100% relief of the pain for the duration of the local anesthetic as seen by a drop in her VAS score from 8/10 to a 0/10 at the time of her discharge.  On follow-up 2 weeks later, she refers having attained 0% relief for the first hour, which is clearly inaccurate.  Again, this makes the assessment of the diagnostic portion of these procedures extremely difficult and inaccurate.  To further explore the possibility of something else going on, I have entered an order for x-rays of the thoracic spine and a CT scan of the lumbar spine.  Post-procedure evaluation     Procedure:          Anesthesia, Analgesia, Anxiolysis:  Type: Erector spinae muscle Trigger Point Injection (1-2 muscle groups) #1  CPT: 20552 Primary Purpose: Diagnostic/therapeutic Region: Posterolateral Thoracolumbar Level: Thoracolumbar Target Area: Erector spinae muscle Trigger Point Approach: Percutaneous, ipsilateral approach. Laterality: Right        Type: Local Anesthesia Local Anesthetic: Lidocaine 1-2% Sedation: None  Indication(s):  Analgesia Route: Infiltration ([place]/IM) IV Access: N/A   Position: Sitting   1. Trigger point with back pain (Right)   2. Chronic low back pain (1ry area of Pain) (Right)   3. DDD (degenerative disc disease), lumbar   4. Grade 1 Anterolisthesis of L3/L4 and L4/L5 (5 mm)   5. Lumbar facet arthropathy   6. Lumbar facet hypertrophy (Multilevel) (Bilateral)    7. Lumbar spondylosis   8. Osteoarthritis of hips (Bilateral)   9. Chronic pain syndrome   10. Pharmacologic therapy   11. Chronic use of opiate for therapeutic purpose    NAS-11 Pain score:   Pre-procedure: 8 /10   Post-procedure: 0-No pain/10     Effectiveness:  Initial hour after procedure: 0 %. Subsequent 4-6 hours post-procedure: 0 %. Analgesia past initial 6 hours: 0 % (Patient states she was pain free on 07-25-2021 only). Ongoing improvement:  Analgesic: According to the patient she did not get any benefit from the procedure.  However, the pain score that we obtain before she left the practice indicated that her pain had gone from an 8 to a 0. Function: No improvement.  She indicates that She is sitting down she has no pain but whenever she stands up the pain immediately starts. ROM: No improvement  Pharmacotherapy Assessment   Opioid Analgesic: Tramadol 50 mg, 1 tablet PO q 6 hrs (200 mg/day of tramadol) MME/day: 20 mg/day.   Monitoring: [place] PMP: PDMP reviewed during this encounter.       Pharmacotherapy: No side-effects or adverse reactions reported. Compliance: No problems identified. Effectiveness: Clinically acceptable. Plan: Refer to "POC". UDS:  Summary  Date Value Ref Range Status  10/24/2020 Note  Final    Comment:    ==================================================================== ToxASSURE Select 13 (MW) ==================================================================== Test                             Result       Flag       Units  Drug Present and Declared  for Prescription Verification   Tramadol                       >6667        EXPECTED   ng/mg creat   O-Desmethyltramadol            >6667        EXPECTED   ng/mg creat   N-Desmethyltramadol            >6667        EXPECTED   ng/mg creat    Source of tramadol is a prescription medication. O-desmethyltramadol    and N-desmethyltramadol are expected metabolites of  tramadol.  ==================================================================== Test                      Result    Flag   Units      Ref Range   Creatinine              75               mg/dL      >=20 ==================================================================== Declared Medications:  The flagging and interpretation on this report are based on the  following declared medications.  Unexpected results may arise from  inaccuracies in the declared medications.   **Note: The testing scope of this panel includes these medications:   Tramadol   **Note: The testing scope of this panel does not include the  following reported medications:   Acetaminophen  Apixaban  Cyanocobalamin  Fluticasone  Furosemide  Levothyroxine  Multivitamin  Omeprazole  Sotalol  Vitamin D3 ==================================================================== For clinical consultation, please call [phone]. ====================================================================      Laboratory Chemistry Profile   Renal Lab Results  Component Value Date   BUN 17 06/28/2021   CREATININE 0.70 06/28/2021   LABCREA 90 01/11/2020   BCR 24 (H) 04/26/2020   GFR 74.05 06/28/2021   GFRAA >60 12/18/2019   GFRNONAA >60 12/18/2019    Hepatic Lab Results  Component Value Date   AST 21 06/28/2021   ALT 9 06/28/2021   ALBUMIN 3.9 06/28/2021   ALKPHOS 42 06/28/2021   LIPASE 181 11/02/2013    Electrolytes Lab Results  Component Value Date   NA 136 06/28/2021   K 4.1 06/28/2021   CL 98 06/28/2021   CALCIUM 9.1 06/28/2021   MG 2.0 03/30/2019   PHOS 3.1 04/26/2020    Bone Lab Results  Component Value Date   VD25OH 28.73 (L) 01/11/2020   25OHVITD1 14 (L) 03/30/2019   25OHVITD2 <1.0 03/30/2019   25OHVITD3 14 03/30/2019    Inflammation (CRP: Acute Phase) (ESR: Chronic Phase) Lab Results  Component Value Date   CRP <1 03/30/2019   ESRSEDRATE 50 (H) 03/30/2019         Note: Above Lab  results reviewed.  Imaging  DG Ribs Unilateral Right CLINICAL DATA:  Right ribcage trauma.  EXAM: RIGHT RIBS - 2 VIEW  COMPARISON:  PA Lat chest 09/08/2018  FINDINGS: No displaced fracture or other bone lesions are seen involving the ribs. Moderate generalized osteopenia is again noted. Mild thoracic dextroscoliosis.  The lungs show chronic changes without infiltrate. Mild cardiomegaly without CHF.  There is aortic tortuosity with patchy calcification of the transverse segment. Low inspiration.  IMPRESSION: Osteopenia with no appreciable displaced rib fractures or other acute chest findings.  Electronically Signed   By: Telford Nab M.D.   On: 06/29/2021 20:39  Assessment  The primary encounter  diagnosis was Chronic low back pain (1ry area of Pain) (Right). Diagnoses of DDD (degenerative disc disease), lumbar, Grade 1 Anterolisthesis of L3/L4 and L4/L5 (5 mm), Lumbar facet arthropathy, Lumbar facet hypertrophy (Multilevel) (Bilateral), Lumbar spondylosis, Thoracic paracentral T7-8 disc protrusion, Pathological fracture of sacral vertebra due to secondary osteoporosis ([place]), Osteoarthritis of hips (Bilateral), Trigger point with back pain (Right), Chronic pain syndrome, Memory impairment, and Chronic anticoagulation (Eliquis) were also pertinent to this visit.  Plan of Care  Problem-specific:  No problem-specific Assessment & Plan notes found for this encounter.  Amanda Soto has a current medication list which includes the following long-term medication(s): apixaban, calcium citrate, fluticasone, levothyroxine, and [START ON 08/06/2021] tramadol.  Pharmacotherapy (Medications Ordered): No orders of the defined types were placed in this encounter.  Orders:  Orders Placed This Encounter  Procedures   DG Thoracic Spine 4V    Patient presents with axial pain with possible radicular component. Please assist [place] in identifying specific level(s) and laterality of any  additional findings such as: 1. Facet (Zygapophyseal) joint DJD (Hypertrophy, space narrowing, subchondral sclerosis, and/or osteophyte formation) 2. DDD and/or IVDD (Loss of disc height, desiccation, gas patterns, osteophytes, endplate sclerosis, or "Black disc disease") 3. Pars defects 4. Spondylolisthesis, spondylosis, and/or spondyloarthropathies (include Degree/Grade of displacement in mm) (stability) 5. Vertebral body Fractures (acute/chronic) (state percentage of collapse) 6. Demineralization (osteopenia/osteoporotic) 7. Bone pathology 8. Foraminal narrowing  9. Surgical changes    Standing Status:   Future    Standing Expiration Date:   10/31/2021    Order Specific Question:   Reason for Exam (SYMPTOM  OR DIAGNOSIS REQUIRED)    Answer:   Upper back pain and/or thoracic spine pain.    Order Specific Question:   Preferred imaging location?    Answer:   [facility]    Order Specific Question:   Call Results- Best Contact Number?    Answer:   [phone] ([facility])   [place]    Patient presents with axial pain with possible radicular component. Please assist [place] in identifying specific level(s) and laterality of any additional findings such as: 1. Facet (Zygapophyseal) joint DJD (Hypertrophy, space narrowing, subchondral sclerosis, and/or osteophyte formation) 2. DDD and/or IVDD (Loss of disc height, desiccation, gas patterns, osteophytes, endplate sclerosis, or "Black disc disease") 3. Pars defects 4. Spondylolisthesis, spondylosis, and/or spondyloarthropathies (include Degree/Grade of displacement in mm) (stability) 5. Vertebral body Fractures (acute/chronic) (state percentage of collapse) 6. Demineralization (osteopenia/osteoporotic) 7. Bone pathology 8. Foraminal narrowing  9. Surgical changes 10. Central, Lateral Recess, and/or Foraminal Stenosis (include AP diameter of stenosis in mm) 11. Surgical changes (hardware type, status, and presence  of fibrosis) 12. Modic Type Changes (MRI only) 13. IVDD (Disc bulge, protrusion, herniation, extrusion) (Level, laterality, extent)    Standing Status:   Future    Standing Expiration Date:   08/31/2021    Scheduling Instructions:     Imaging must be done as soon as possible. Inform patient that order will expire within 30 days and I will not renew it.    Order Specific Question:   Preferred imaging location?    Answer:   [facility]    Order Specific Question:   Call Results- Best Contact Number?    Answer:   [phone] ([facility])    Order Specific Question:   Radiology Contrast Protocol - do NOT remove file path    Answer:   \[email or web address]   Follow-up plan:   Return for  Eval-day (M,W), (VV), for review of ordered tests.     Interventional Therapies  Risk   Complexity Considerations:   Estimated body mass index is 22.15 kg/m as calculated from the following:   Height as of this encounter: _0  (1.473 m).   Weight as of this encounter: 106 lb (48.1 kg). NOTE: Eliquis Anticoagulation (Stop:3 days   Restart: 6 hrs)  Advanced age  Latex allergy   Planned   Pending:      Under consideration:   Palliative right lumbar facet RFA #3   Completed:   Diagnostic/therapeutic right erector spinae muscle trigger point x1 (07/20/2021) (0/0/0)  Palliative/therapeutic caudal ESI x1 (07/21/2019) (100/100/90/90)  Palliative right lumbar facet (L2, L3, L4, L5, & S1) MBB x9 (09/07/2020) (100/100/90/75)  Palliative/Therapeutic right lumbar facet RFA x3 (01/28/21) (2nd: 100/100/100/90)  Palliative right sacroiliac joint block x4 (01/24/2021) (100/100/0/0)  Palliative/therapeutic right-sided SI joint RFA x1 (03/27/2018) (100/90/0)    Therapeutic   Palliative (PRN) options:   Palliative/therapeutic caudal ESI #2  Palliative right lumbar facet block #10  Palliative/Therapeutic right lumbar facet RFA #3     Recent Visits Date Type Provider Dept   07/20/21 Office Visit Milinda Pointer, MD [facility]  Showing recent visits within past 90 days and meeting all other requirements Today's Visits Date Type Provider Dept  08/03/21 Office Visit Milinda Pointer, MD [facility]  Showing today's visits and meeting all other requirements Future Appointments No visits were found meeting these conditions. Showing future appointments within next 90 days and meeting all other requirements  I discussed the assessment and treatment plan with the patient. The patient was provided an opportunity to ask questions and all were answered. The patient agreed with the plan and demonstrated an understanding of the instructions.  Patient advised to call back or seek an in-person evaluation if the symptoms or condition worsens.  Duration of encounter: 12 minutes.  Note by: Gaspar Cola, MD Date: 08/03/2021; Time: 1:19 PM

## 2021-08-04 DIAGNOSIS — I051 Rheumatic mitral insufficiency: Secondary | ICD-10-CM | POA: Diagnosis not present

## 2021-08-04 DIAGNOSIS — F32A Depression, unspecified: Secondary | ICD-10-CM | POA: Diagnosis not present

## 2021-08-04 DIAGNOSIS — M5136 Other intervertebral disc degeneration, lumbar region: Secondary | ICD-10-CM | POA: Diagnosis not present

## 2021-08-04 DIAGNOSIS — M1610 Unilateral primary osteoarthritis, unspecified hip: Secondary | ICD-10-CM | POA: Diagnosis not present

## 2021-08-04 DIAGNOSIS — K219 Gastro-esophageal reflux disease without esophagitis: Secondary | ICD-10-CM | POA: Diagnosis not present

## 2021-08-04 DIAGNOSIS — I110 Hypertensive heart disease with heart failure: Secondary | ICD-10-CM | POA: Diagnosis not present

## 2021-08-04 DIAGNOSIS — I4891 Unspecified atrial fibrillation: Secondary | ICD-10-CM | POA: Diagnosis not present

## 2021-08-04 DIAGNOSIS — Z9181 History of falling: Secondary | ICD-10-CM | POA: Diagnosis not present

## 2021-08-04 DIAGNOSIS — Z6822 Body mass index (BMI) 22.0-22.9, adult: Secondary | ICD-10-CM | POA: Diagnosis not present

## 2021-08-04 DIAGNOSIS — I509 Heart failure, unspecified: Secondary | ICD-10-CM | POA: Diagnosis not present

## 2021-08-04 DIAGNOSIS — M546 Pain in thoracic spine: Secondary | ICD-10-CM | POA: Diagnosis not present

## 2021-08-04 DIAGNOSIS — G8929 Other chronic pain: Secondary | ICD-10-CM | POA: Diagnosis not present

## 2021-08-04 DIAGNOSIS — R7303 Prediabetes: Secondary | ICD-10-CM | POA: Diagnosis not present

## 2021-08-04 DIAGNOSIS — E785 Hyperlipidemia, unspecified: Secondary | ICD-10-CM | POA: Diagnosis not present

## 2021-08-04 DIAGNOSIS — M545 Low back pain, unspecified: Secondary | ICD-10-CM | POA: Diagnosis not present

## 2021-08-04 DIAGNOSIS — M810 Age-related osteoporosis without current pathological fracture: Secondary | ICD-10-CM | POA: Diagnosis not present

## 2021-08-04 DIAGNOSIS — E039 Hypothyroidism, unspecified: Secondary | ICD-10-CM | POA: Diagnosis not present

## 2021-08-04 DIAGNOSIS — M47817 Spondylosis without myelopathy or radiculopathy, lumbosacral region: Secondary | ICD-10-CM | POA: Diagnosis not present

## 2021-08-04 DIAGNOSIS — D649 Anemia, unspecified: Secondary | ICD-10-CM | POA: Diagnosis not present

## 2021-08-04 NOTE — Patient Instructions (Signed)
Someone from radiology will call you to schedule the CT of your lower back. The x-rays of the thoracic spine do not require an appt. Come to the hospital at your convenience to get that done.

## 2021-08-08 ENCOUNTER — Ambulatory Visit: Payer: Medicare Other

## 2021-08-09 ENCOUNTER — Encounter: Payer: Self-pay | Admitting: Family Medicine

## 2021-08-09 ENCOUNTER — Ambulatory Visit (INDEPENDENT_AMBULATORY_CARE_PROVIDER_SITE_OTHER): Payer: Medicare Other | Admitting: Family Medicine

## 2021-08-09 ENCOUNTER — Other Ambulatory Visit: Payer: Self-pay

## 2021-08-09 VITALS — BP 120/60 | HR 72 | Temp 98.4°F | Ht <= 58 in | Wt 101.6 lb

## 2021-08-09 DIAGNOSIS — R634 Abnormal weight loss: Secondary | ICD-10-CM | POA: Diagnosis not present

## 2021-08-09 DIAGNOSIS — M545 Low back pain, unspecified: Secondary | ICD-10-CM

## 2021-08-09 DIAGNOSIS — G8929 Other chronic pain: Secondary | ICD-10-CM | POA: Diagnosis not present

## 2021-08-09 DIAGNOSIS — E039 Hypothyroidism, unspecified: Secondary | ICD-10-CM | POA: Diagnosis not present

## 2021-08-09 LAB — COMPREHENSIVE METABOLIC PANEL
ALT: 15 U/L (ref 0–35)
AST: 16 U/L (ref 0–37)
Albumin: 3.5 g/dL (ref 3.5–5.2)
Alkaline Phosphatase: 43 U/L (ref 39–117)
BUN: 16 mg/dL (ref 6–23)
CO2: 26 mEq/L (ref 19–32)
Calcium: 9 mg/dL (ref 8.4–10.5)
Chloride: 99 mEq/L (ref 96–112)
Creatinine, Ser: 0.59 mg/dL (ref 0.40–1.20)
GFR: 77.11 mL/min (ref >60.00)
Glucose, Bld: 104 mg/dL — ABNORMAL HIGH (ref 70–99)
Potassium: 3.8 mEq/L (ref 3.5–5.1)
Sodium: 133 mEq/L — ABNORMAL LOW (ref 135–145)
Total Bilirubin: 0.9 mg/dL (ref 0.2–1.2)
Total Protein: 6.8 g/dL (ref 6.0–8.3)

## 2021-08-09 LAB — CBC WITH DIFFERENTIAL/PLATELET
Basophils Absolute: 0 10*3/uL (ref 0.0–0.1)
Basophils Relative: 0.4 % (ref 0.0–3.0)
Eosinophils Absolute: 0.1 10*3/uL (ref 0.0–0.7)
Eosinophils Relative: 0.9 % (ref 0.0–5.0)
HCT: 34.6 % — ABNORMAL LOW (ref 36.0–46.0)
Hemoglobin: 10.9 g/dL — ABNORMAL LOW (ref 12.0–15.0)
Lymphocytes Relative: 11.3 % — ABNORMAL LOW (ref 12.0–46.0)
Lymphs Abs: 0.9 10*3/uL (ref 0.7–4.0)
MCHC: 31.6 g/dL (ref 30.0–36.0)
MCV: 88.5 fl (ref 78.0–100.0)
Monocytes Absolute: 0.4 10*3/uL (ref 0.1–1.0)
Monocytes Relative: 5.2 % (ref 3.0–12.0)
Neutro Abs: 6.4 10*3/uL (ref 1.4–7.7)
Neutrophils Relative %: 82.2 % — ABNORMAL HIGH (ref 43.0–77.0)
Platelets: 282 10*3/uL (ref 150.0–400.0)
RBC: 3.9 Mil/uL (ref 3.87–5.11)
RDW: 17.5 % — ABNORMAL HIGH (ref 11.5–15.5)
WBC: 7.8 10*3/uL (ref 4.0–10.5)

## 2021-08-09 LAB — TSH: TSH: 2.04 u[IU]/mL (ref 0.35–5.50)

## 2021-08-09 NOTE — Assessment & Plan Note (Signed)
Patient continues to have issues with chronic low back pain.  She will continue tramadol through her pain specialist.  She can continue Tylenol over-the-counter.  She will proceed with imaging through her pain specialist.

## 2021-08-09 NOTE — Assessment & Plan Note (Signed)
The patient has had progressive weight loss.  Since going up on the dose of her Synthroid her weight has dropped further.  We will check a TSH to see if this has been overcorrected.  Her pain may also be playing a role with her decreased appetite and weight loss.  Lab work as outlined to evaluate this issue.

## 2021-08-09 NOTE — Patient Instructions (Signed)
Nice to see you. We will get lab work today. Please follow-up with your pain specialist as planned.

## 2021-08-09 NOTE — Assessment & Plan Note (Signed)
Check TSH.  The patient has had some weight loss since increasing her dose of Synthroid.  The weight loss may be related to that.  For now she will continue Synthroid 75 mcg once daily.

## 2021-08-09 NOTE — Progress Notes (Signed)
Amanda Rumps, MD Phone: [phone]  Amanda Soto is a 88 y.o. female who presents today for follow-up.  Hypothyroidism: Patient recently had her Synthroid increased for a TSH of 5.61.  She does note some dry skin.  She has had some weight loss.  No heat or cold intolerance.  Chronic pain: Patient notes the rib pain that she was evaluated for previously has improved.  She continues to have chronic low back pain.  Her pain specialist ordered a CT scan and an x-ray.  She continues on tramadol and has been taking Tylenol which does help a bit for pain.  Her back pain does not radiate down her legs.  Weight loss: The patient has been eating lots of frozen meals.  She eats cereal for breakfast.  Oftentimes does not eat lunch.  She has dinner around 4 PM or 5 PM.  She notes no abdominal pain, night sweats, vomiting, diarrhea, blood in her stool, hematuria, cough, congestion, fevers, or shortness of breath.  She does note her chronic pain makes her feel like she does not want to eat.  She occasionally has nausea though it only occurs when she takes the tramadol on an empty stomach.  Social History   Tobacco Use  Smoking Status Never  Smokeless Tobacco Never    Current Outpatient Medications on File Prior to Visit  Medication Sig Dispense Refill   acetaminophen (TYLENOL) 325 MG tablet Take 500 mg by mouth every 6 (six) hours as needed for moderate pain, fever or headache.     amiodarone (PACERONE) 200 MG tablet Take 200 mg by mouth daily.     apixaban (ELIQUIS) 2.5 MG TABS tablet Take 1 tablet (2.5 mg total) by mouth 2 (two) times daily. 180 tablet 3   Calcium Citrate (CITRACAL PO) Take 1 tablet by mouth daily.     Cholecalciferol (VITAMIN D3) 50 MCG (2000 UT) CHEW Chew 1 tablet by mouth daily.     fluticasone (FLONASE) 50 MCG/ACT nasal spray SPRAY 2 SPRAYS INTO EACH NOSTRIL EVERY DAY     furosemide (LASIX) 20 MG tablet Take 20 mg by mouth as needed.     levothyroxine (SYNTHROID) 75 MCG  tablet Take 1 tablet (75 mcg total) by mouth daily before breakfast. 90 tablet 1   Multiple Vitamins-Minerals (PRESERVISION AREDS PO) Take 1 tablet by mouth daily.     traMADol (ULTRAM) 50 MG tablet Take 2 tablets (100 mg total) by mouth every 6 (six) hours. Each refill must last 30 days 240 tablet 5   vitamin B-12 (CYANOCOBALAMIN) 1000 MCG tablet Take by mouth.     No current facility-administered medications on file prior to visit.     ROS see history of present illness  Objective  Physical Exam Vitals:   08/09/21 1335  BP: 120/60  Pulse: 72  Temp: 98.4 F (36.9 C)  SpO2: 98%    BP Readings from Last 3 Encounters:  08/09/21 120/60  07/20/21 118/69  06/28/21 128/70   Wt Readings from Last 3 Encounters:  08/09/21 101 lb 9.6 oz (46.1 kg)  07/20/21 106 lb (48.1 kg)  06/28/21 106 lb 6.4 oz (48.3 kg)    Physical Exam Constitutional:      General: She is not in acute distress.    Appearance: She is not diaphoretic.  Cardiovascular:     Rate and Rhythm: Normal rate and regular rhythm.     Heart sounds: Normal heart sounds.  Pulmonary:     Effort: Pulmonary effort is normal.  Breath sounds: Normal breath sounds.  Musculoskeletal:     Comments: No midline spine tenderness, no midline spine step-off, no muscular back tenderness  Skin:    General: Skin is warm and dry.  Neurological:     Mental Status: She is alert.     Assessment/Plan: Please see individual problem list.  Problem List Items Addressed This Visit     Chronic low back pain (1ry area of Pain) (Right) (Chronic)    Patient continues to have issues with chronic low back pain.  She will continue tramadol through her pain specialist.  She can continue Tylenol over-the-counter.  She will proceed with imaging through her pain specialist.      Hypothyroidism    Check TSH.  The patient has had some weight loss since increasing her dose of Synthroid.  The weight loss may be related to that.  For now she will  continue Synthroid 75 mcg once daily.      Weight loss - Primary    The patient has had progressive weight loss.  Since going up on the dose of her Synthroid her weight has dropped further.  We will check a TSH to see if this has been overcorrected.  Her pain may also be playing a role with her decreased appetite and weight loss.  Lab work as outlined to evaluate this issue.      Relevant Orders   Comp Met (CMET) (Completed)   CBC w/Diff (Completed)   TSH (Completed)    Return in about 6 weeks (around 09/20/2021) for Weight follow-up.  This visit occurred during the SARS-CoV-2 public health emergency.  Safety protocols were in place, including screening questions prior to the visit, additional usage of staff PPE, and extensive cleaning of exam room while observing appropriate contact time as indicated for disinfecting solutions.   I have spent 35 minutes in the care of this patient regarding history taking, documentation, review of plan from recent pain management note, discussion of plan, placing orders.   Amanda Rumps, MD [place]

## 2021-08-10 DIAGNOSIS — E785 Hyperlipidemia, unspecified: Secondary | ICD-10-CM | POA: Diagnosis not present

## 2021-08-10 DIAGNOSIS — M545 Low back pain, unspecified: Secondary | ICD-10-CM | POA: Diagnosis not present

## 2021-08-10 DIAGNOSIS — E039 Hypothyroidism, unspecified: Secondary | ICD-10-CM | POA: Diagnosis not present

## 2021-08-10 DIAGNOSIS — M5136 Other intervertebral disc degeneration, lumbar region: Secondary | ICD-10-CM | POA: Diagnosis not present

## 2021-08-10 DIAGNOSIS — M1610 Unilateral primary osteoarthritis, unspecified hip: Secondary | ICD-10-CM | POA: Diagnosis not present

## 2021-08-10 DIAGNOSIS — K219 Gastro-esophageal reflux disease without esophagitis: Secondary | ICD-10-CM | POA: Diagnosis not present

## 2021-08-10 DIAGNOSIS — R7303 Prediabetes: Secondary | ICD-10-CM | POA: Diagnosis not present

## 2021-08-10 DIAGNOSIS — M47817 Spondylosis without myelopathy or radiculopathy, lumbosacral region: Secondary | ICD-10-CM | POA: Diagnosis not present

## 2021-08-10 DIAGNOSIS — Z9181 History of falling: Secondary | ICD-10-CM | POA: Diagnosis not present

## 2021-08-10 DIAGNOSIS — F32A Depression, unspecified: Secondary | ICD-10-CM | POA: Diagnosis not present

## 2021-08-10 DIAGNOSIS — Z6822 Body mass index (BMI) 22.0-22.9, adult: Secondary | ICD-10-CM | POA: Diagnosis not present

## 2021-08-10 DIAGNOSIS — G8929 Other chronic pain: Secondary | ICD-10-CM | POA: Diagnosis not present

## 2021-08-10 DIAGNOSIS — I509 Heart failure, unspecified: Secondary | ICD-10-CM | POA: Diagnosis not present

## 2021-08-10 DIAGNOSIS — M546 Pain in thoracic spine: Secondary | ICD-10-CM | POA: Diagnosis not present

## 2021-08-10 DIAGNOSIS — I110 Hypertensive heart disease with heart failure: Secondary | ICD-10-CM | POA: Diagnosis not present

## 2021-08-10 DIAGNOSIS — I4891 Unspecified atrial fibrillation: Secondary | ICD-10-CM | POA: Diagnosis not present

## 2021-08-10 DIAGNOSIS — M810 Age-related osteoporosis without current pathological fracture: Secondary | ICD-10-CM | POA: Diagnosis not present

## 2021-08-10 DIAGNOSIS — D649 Anemia, unspecified: Secondary | ICD-10-CM | POA: Diagnosis not present

## 2021-08-10 DIAGNOSIS — I051 Rheumatic mitral insufficiency: Secondary | ICD-10-CM | POA: Diagnosis not present

## 2021-08-11 ENCOUNTER — Other Ambulatory Visit: Payer: Self-pay | Admitting: Family Medicine

## 2021-08-11 DIAGNOSIS — M545 Low back pain, unspecified: Secondary | ICD-10-CM | POA: Diagnosis not present

## 2021-08-11 DIAGNOSIS — M1610 Unilateral primary osteoarthritis, unspecified hip: Secondary | ICD-10-CM | POA: Diagnosis not present

## 2021-08-11 DIAGNOSIS — R7303 Prediabetes: Secondary | ICD-10-CM | POA: Diagnosis not present

## 2021-08-11 DIAGNOSIS — I509 Heart failure, unspecified: Secondary | ICD-10-CM | POA: Diagnosis not present

## 2021-08-11 DIAGNOSIS — E039 Hypothyroidism, unspecified: Secondary | ICD-10-CM | POA: Diagnosis not present

## 2021-08-11 DIAGNOSIS — D649 Anemia, unspecified: Secondary | ICD-10-CM

## 2021-08-11 DIAGNOSIS — G8929 Other chronic pain: Secondary | ICD-10-CM | POA: Diagnosis not present

## 2021-08-11 DIAGNOSIS — M5136 Other intervertebral disc degeneration, lumbar region: Secondary | ICD-10-CM | POA: Diagnosis not present

## 2021-08-11 DIAGNOSIS — K219 Gastro-esophageal reflux disease without esophagitis: Secondary | ICD-10-CM | POA: Diagnosis not present

## 2021-08-11 DIAGNOSIS — I051 Rheumatic mitral insufficiency: Secondary | ICD-10-CM | POA: Diagnosis not present

## 2021-08-11 DIAGNOSIS — Z6822 Body mass index (BMI) 22.0-22.9, adult: Secondary | ICD-10-CM | POA: Diagnosis not present

## 2021-08-11 DIAGNOSIS — Z9181 History of falling: Secondary | ICD-10-CM | POA: Diagnosis not present

## 2021-08-11 DIAGNOSIS — E785 Hyperlipidemia, unspecified: Secondary | ICD-10-CM | POA: Diagnosis not present

## 2021-08-11 DIAGNOSIS — M546 Pain in thoracic spine: Secondary | ICD-10-CM | POA: Diagnosis not present

## 2021-08-11 DIAGNOSIS — F32A Depression, unspecified: Secondary | ICD-10-CM | POA: Diagnosis not present

## 2021-08-11 DIAGNOSIS — I110 Hypertensive heart disease with heart failure: Secondary | ICD-10-CM | POA: Diagnosis not present

## 2021-08-11 DIAGNOSIS — M810 Age-related osteoporosis without current pathological fracture: Secondary | ICD-10-CM | POA: Diagnosis not present

## 2021-08-11 DIAGNOSIS — I4891 Unspecified atrial fibrillation: Secondary | ICD-10-CM | POA: Diagnosis not present

## 2021-08-11 DIAGNOSIS — M47817 Spondylosis without myelopathy or radiculopathy, lumbosacral region: Secondary | ICD-10-CM | POA: Diagnosis not present

## 2021-08-15 DIAGNOSIS — M546 Pain in thoracic spine: Secondary | ICD-10-CM | POA: Diagnosis not present

## 2021-08-15 DIAGNOSIS — D649 Anemia, unspecified: Secondary | ICD-10-CM | POA: Diagnosis not present

## 2021-08-15 DIAGNOSIS — E039 Hypothyroidism, unspecified: Secondary | ICD-10-CM | POA: Diagnosis not present

## 2021-08-15 DIAGNOSIS — I509 Heart failure, unspecified: Secondary | ICD-10-CM | POA: Diagnosis not present

## 2021-08-15 DIAGNOSIS — I051 Rheumatic mitral insufficiency: Secondary | ICD-10-CM | POA: Diagnosis not present

## 2021-08-15 DIAGNOSIS — I110 Hypertensive heart disease with heart failure: Secondary | ICD-10-CM | POA: Diagnosis not present

## 2021-08-15 DIAGNOSIS — Z9181 History of falling: Secondary | ICD-10-CM | POA: Diagnosis not present

## 2021-08-15 DIAGNOSIS — I4891 Unspecified atrial fibrillation: Secondary | ICD-10-CM | POA: Diagnosis not present

## 2021-08-15 DIAGNOSIS — Z6822 Body mass index (BMI) 22.0-22.9, adult: Secondary | ICD-10-CM | POA: Diagnosis not present

## 2021-08-15 DIAGNOSIS — M810 Age-related osteoporosis without current pathological fracture: Secondary | ICD-10-CM | POA: Diagnosis not present

## 2021-08-15 DIAGNOSIS — M47817 Spondylosis without myelopathy or radiculopathy, lumbosacral region: Secondary | ICD-10-CM | POA: Diagnosis not present

## 2021-08-15 DIAGNOSIS — F32A Depression, unspecified: Secondary | ICD-10-CM | POA: Diagnosis not present

## 2021-08-15 DIAGNOSIS — E785 Hyperlipidemia, unspecified: Secondary | ICD-10-CM | POA: Diagnosis not present

## 2021-08-15 DIAGNOSIS — G8929 Other chronic pain: Secondary | ICD-10-CM | POA: Diagnosis not present

## 2021-08-15 DIAGNOSIS — K219 Gastro-esophageal reflux disease without esophagitis: Secondary | ICD-10-CM | POA: Diagnosis not present

## 2021-08-15 DIAGNOSIS — M545 Low back pain, unspecified: Secondary | ICD-10-CM | POA: Diagnosis not present

## 2021-08-15 DIAGNOSIS — M1610 Unilateral primary osteoarthritis, unspecified hip: Secondary | ICD-10-CM | POA: Diagnosis not present

## 2021-08-15 DIAGNOSIS — M5136 Other intervertebral disc degeneration, lumbar region: Secondary | ICD-10-CM | POA: Diagnosis not present

## 2021-08-15 DIAGNOSIS — R7303 Prediabetes: Secondary | ICD-10-CM | POA: Diagnosis not present

## 2021-08-17 DIAGNOSIS — M810 Age-related osteoporosis without current pathological fracture: Secondary | ICD-10-CM | POA: Diagnosis not present

## 2021-08-17 DIAGNOSIS — Z6822 Body mass index (BMI) 22.0-22.9, adult: Secondary | ICD-10-CM | POA: Diagnosis not present

## 2021-08-17 DIAGNOSIS — M545 Low back pain, unspecified: Secondary | ICD-10-CM | POA: Diagnosis not present

## 2021-08-17 DIAGNOSIS — Z9181 History of falling: Secondary | ICD-10-CM | POA: Diagnosis not present

## 2021-08-17 DIAGNOSIS — D649 Anemia, unspecified: Secondary | ICD-10-CM | POA: Diagnosis not present

## 2021-08-17 DIAGNOSIS — M5136 Other intervertebral disc degeneration, lumbar region: Secondary | ICD-10-CM | POA: Diagnosis not present

## 2021-08-17 DIAGNOSIS — I509 Heart failure, unspecified: Secondary | ICD-10-CM | POA: Diagnosis not present

## 2021-08-17 DIAGNOSIS — F32A Depression, unspecified: Secondary | ICD-10-CM | POA: Diagnosis not present

## 2021-08-17 DIAGNOSIS — E785 Hyperlipidemia, unspecified: Secondary | ICD-10-CM | POA: Diagnosis not present

## 2021-08-17 DIAGNOSIS — K219 Gastro-esophageal reflux disease without esophagitis: Secondary | ICD-10-CM | POA: Diagnosis not present

## 2021-08-17 DIAGNOSIS — M47817 Spondylosis without myelopathy or radiculopathy, lumbosacral region: Secondary | ICD-10-CM | POA: Diagnosis not present

## 2021-08-17 DIAGNOSIS — I110 Hypertensive heart disease with heart failure: Secondary | ICD-10-CM | POA: Diagnosis not present

## 2021-08-17 DIAGNOSIS — I4891 Unspecified atrial fibrillation: Secondary | ICD-10-CM | POA: Diagnosis not present

## 2021-08-17 DIAGNOSIS — G8929 Other chronic pain: Secondary | ICD-10-CM | POA: Diagnosis not present

## 2021-08-17 DIAGNOSIS — M546 Pain in thoracic spine: Secondary | ICD-10-CM | POA: Diagnosis not present

## 2021-08-17 DIAGNOSIS — I051 Rheumatic mitral insufficiency: Secondary | ICD-10-CM | POA: Diagnosis not present

## 2021-08-17 DIAGNOSIS — R7303 Prediabetes: Secondary | ICD-10-CM | POA: Diagnosis not present

## 2021-08-17 DIAGNOSIS — E039 Hypothyroidism, unspecified: Secondary | ICD-10-CM | POA: Diagnosis not present

## 2021-08-17 DIAGNOSIS — M1610 Unilateral primary osteoarthritis, unspecified hip: Secondary | ICD-10-CM | POA: Diagnosis not present

## 2021-08-21 ENCOUNTER — Ambulatory Visit
Admission: RE | Admit: 2021-08-21 | Discharge: 2021-08-21 | Disposition: A | Discharge status: Home / Self Care | Payer: Medicare Other | Source: Home / Self Care | Attending: Pain Medicine | Admitting: Pain Medicine

## 2021-08-21 ENCOUNTER — Other Ambulatory Visit: Payer: Self-pay

## 2021-08-21 ENCOUNTER — Ambulatory Visit
Admission: RE | Admit: 2021-08-21 | Discharge: 2021-08-21 | Disposition: A | Discharge status: Home / Self Care | Payer: Medicare Other | Source: Ambulatory Visit | Attending: Pain Medicine | Admitting: Pain Medicine

## 2021-08-21 ENCOUNTER — Other Ambulatory Visit (INDEPENDENT_AMBULATORY_CARE_PROVIDER_SITE_OTHER): Payer: Medicare Other

## 2021-08-21 DIAGNOSIS — M160 Bilateral primary osteoarthritis of hip: Secondary | ICD-10-CM | POA: Diagnosis not present

## 2021-08-21 DIAGNOSIS — G8929 Other chronic pain: Secondary | ICD-10-CM

## 2021-08-21 DIAGNOSIS — M5134 Other intervertebral disc degeneration, thoracic region: Secondary | ICD-10-CM | POA: Insufficient documentation

## 2021-08-21 DIAGNOSIS — M47816 Spondylosis without myelopathy or radiculopathy, lumbar region: Secondary | ICD-10-CM | POA: Diagnosis not present

## 2021-08-21 DIAGNOSIS — M51369 Other intervertebral disc degeneration, lumbar region without mention of lumbar back pain or lower extremity pain: Secondary | ICD-10-CM

## 2021-08-21 DIAGNOSIS — M545 Low back pain, unspecified: Secondary | ICD-10-CM | POA: Diagnosis not present

## 2021-08-21 DIAGNOSIS — D649 Anemia, unspecified: Secondary | ICD-10-CM | POA: Diagnosis not present

## 2021-08-21 DIAGNOSIS — M546 Pain in thoracic spine: Secondary | ICD-10-CM | POA: Diagnosis not present

## 2021-08-21 DIAGNOSIS — G894 Chronic pain syndrome: Secondary | ICD-10-CM | POA: Diagnosis not present

## 2021-08-21 DIAGNOSIS — M4310 Spondylolisthesis, site unspecified: Secondary | ICD-10-CM

## 2021-08-21 DIAGNOSIS — M8088XA Other osteoporosis with current pathological fracture, vertebra(e), initial encounter for fracture: Secondary | ICD-10-CM

## 2021-08-21 DIAGNOSIS — M8588 Other specified disorders of bone density and structure, other site: Secondary | ICD-10-CM | POA: Diagnosis not present

## 2021-08-21 DIAGNOSIS — M5136 Other intervertebral disc degeneration, lumbar region: Secondary | ICD-10-CM | POA: Insufficient documentation

## 2021-08-21 DIAGNOSIS — M40204 Unspecified kyphosis, thoracic region: Secondary | ICD-10-CM | POA: Diagnosis not present

## 2021-08-21 LAB — IBC + FERRITIN
Ferritin: 195.9 ng/mL (ref 10.0–291.0)
Iron: 20 ug/dL — ABNORMAL LOW (ref 42–145)
Saturation Ratios: 5.7 % — ABNORMAL LOW (ref 20.0–50.0)
TIBC: 351.4 ug/dL (ref 250.0–450.0)
Transferrin: 251 mg/dL (ref 212.0–360.0)

## 2021-08-21 LAB — VITAMIN B12: Vitamin B-12: >1504 pg/mL — ABNORMAL HIGH (ref 211–911)

## 2021-08-21 LAB — FOLATE: Folate: 19.1 ng/mL (ref >5.9)

## 2021-08-22 DIAGNOSIS — I051 Rheumatic mitral insufficiency: Secondary | ICD-10-CM | POA: Diagnosis not present

## 2021-08-22 DIAGNOSIS — M47817 Spondylosis without myelopathy or radiculopathy, lumbosacral region: Secondary | ICD-10-CM | POA: Diagnosis not present

## 2021-08-22 DIAGNOSIS — E785 Hyperlipidemia, unspecified: Secondary | ICD-10-CM | POA: Diagnosis not present

## 2021-08-22 DIAGNOSIS — K219 Gastro-esophageal reflux disease without esophagitis: Secondary | ICD-10-CM | POA: Diagnosis not present

## 2021-08-22 DIAGNOSIS — I509 Heart failure, unspecified: Secondary | ICD-10-CM | POA: Diagnosis not present

## 2021-08-22 DIAGNOSIS — E039 Hypothyroidism, unspecified: Secondary | ICD-10-CM | POA: Diagnosis not present

## 2021-08-22 DIAGNOSIS — Z9181 History of falling: Secondary | ICD-10-CM | POA: Diagnosis not present

## 2021-08-22 DIAGNOSIS — D649 Anemia, unspecified: Secondary | ICD-10-CM | POA: Diagnosis not present

## 2021-08-22 DIAGNOSIS — G8929 Other chronic pain: Secondary | ICD-10-CM | POA: Diagnosis not present

## 2021-08-22 DIAGNOSIS — M5136 Other intervertebral disc degeneration, lumbar region: Secondary | ICD-10-CM | POA: Diagnosis not present

## 2021-08-22 DIAGNOSIS — M810 Age-related osteoporosis without current pathological fracture: Secondary | ICD-10-CM | POA: Diagnosis not present

## 2021-08-22 DIAGNOSIS — Z6822 Body mass index (BMI) 22.0-22.9, adult: Secondary | ICD-10-CM | POA: Diagnosis not present

## 2021-08-22 DIAGNOSIS — R7303 Prediabetes: Secondary | ICD-10-CM | POA: Diagnosis not present

## 2021-08-22 DIAGNOSIS — M1610 Unilateral primary osteoarthritis, unspecified hip: Secondary | ICD-10-CM | POA: Diagnosis not present

## 2021-08-22 DIAGNOSIS — M546 Pain in thoracic spine: Secondary | ICD-10-CM | POA: Diagnosis not present

## 2021-08-22 DIAGNOSIS — I110 Hypertensive heart disease with heart failure: Secondary | ICD-10-CM | POA: Diagnosis not present

## 2021-08-22 DIAGNOSIS — F32A Depression, unspecified: Secondary | ICD-10-CM | POA: Diagnosis not present

## 2021-08-22 DIAGNOSIS — I4891 Unspecified atrial fibrillation: Secondary | ICD-10-CM | POA: Diagnosis not present

## 2021-08-22 DIAGNOSIS — M545 Low back pain, unspecified: Secondary | ICD-10-CM | POA: Diagnosis not present

## 2021-08-23 ENCOUNTER — Telehealth: Payer: Self-pay | Admitting: Pain Medicine

## 2021-08-23 ENCOUNTER — Telehealth: Payer: Self-pay | Admitting: Family Medicine

## 2021-08-23 ENCOUNTER — Other Ambulatory Visit: Payer: Self-pay | Admitting: Family Medicine

## 2021-08-23 MED ORDER — FERROUS SULFATE 325 (65 FE) MG PO TABS: 325.0000 mg | ORAL_TABLET | Freq: Every day | ORAL | 1 refills | Status: AC

## 2021-08-23 NOTE — Telephone Encounter (Signed)
Please schedule her for an virtual visit as soon as possible.

## 2021-08-23 NOTE — Telephone Encounter (Signed)
Patient call and would like office to call her about her lab results.

## 2021-08-24 ENCOUNTER — Ambulatory Visit: Payer: Medicare Other | Admitting: Podiatry

## 2021-08-24 ENCOUNTER — Telehealth: Payer: Self-pay | Admitting: Pain Medicine

## 2021-08-24 NOTE — Telephone Encounter (Signed)
Patient had xrays Monday and has not heard anything about them. She is in so much pain and can hardly take it. She needs something for pain. Please call patient to advise

## 2021-08-24 NOTE — Telephone Encounter (Signed)
I called the patient and informed her that the provider sent in a RX for a iron supplement and she asked me to call the pain clinic and see if they would call her and give her xray results, I called the pain clinic and they stated they would call her.  Gerhart Ruggieri,cma

## 2021-08-25 DIAGNOSIS — Z9181 History of falling: Secondary | ICD-10-CM | POA: Diagnosis not present

## 2021-08-25 DIAGNOSIS — F32A Depression, unspecified: Secondary | ICD-10-CM | POA: Diagnosis not present

## 2021-08-25 DIAGNOSIS — R7303 Prediabetes: Secondary | ICD-10-CM | POA: Diagnosis not present

## 2021-08-25 DIAGNOSIS — M810 Age-related osteoporosis without current pathological fracture: Secondary | ICD-10-CM | POA: Diagnosis not present

## 2021-08-25 DIAGNOSIS — I051 Rheumatic mitral insufficiency: Secondary | ICD-10-CM | POA: Diagnosis not present

## 2021-08-25 DIAGNOSIS — M5136 Other intervertebral disc degeneration, lumbar region: Secondary | ICD-10-CM | POA: Diagnosis not present

## 2021-08-25 DIAGNOSIS — I4891 Unspecified atrial fibrillation: Secondary | ICD-10-CM | POA: Diagnosis not present

## 2021-08-25 DIAGNOSIS — M545 Low back pain, unspecified: Secondary | ICD-10-CM | POA: Diagnosis not present

## 2021-08-25 DIAGNOSIS — E039 Hypothyroidism, unspecified: Secondary | ICD-10-CM | POA: Diagnosis not present

## 2021-08-25 DIAGNOSIS — G8929 Other chronic pain: Secondary | ICD-10-CM | POA: Diagnosis not present

## 2021-08-25 DIAGNOSIS — M47817 Spondylosis without myelopathy or radiculopathy, lumbosacral region: Secondary | ICD-10-CM | POA: Diagnosis not present

## 2021-08-25 DIAGNOSIS — Z6822 Body mass index (BMI) 22.0-22.9, adult: Secondary | ICD-10-CM | POA: Diagnosis not present

## 2021-08-25 DIAGNOSIS — E785 Hyperlipidemia, unspecified: Secondary | ICD-10-CM | POA: Diagnosis not present

## 2021-08-25 DIAGNOSIS — M1610 Unilateral primary osteoarthritis, unspecified hip: Secondary | ICD-10-CM | POA: Diagnosis not present

## 2021-08-25 DIAGNOSIS — D649 Anemia, unspecified: Secondary | ICD-10-CM | POA: Diagnosis not present

## 2021-08-25 DIAGNOSIS — M546 Pain in thoracic spine: Secondary | ICD-10-CM | POA: Diagnosis not present

## 2021-08-25 DIAGNOSIS — I509 Heart failure, unspecified: Secondary | ICD-10-CM | POA: Diagnosis not present

## 2021-08-25 DIAGNOSIS — K219 Gastro-esophageal reflux disease without esophagitis: Secondary | ICD-10-CM | POA: Diagnosis not present

## 2021-08-25 DIAGNOSIS — I110 Hypertensive heart disease with heart failure: Secondary | ICD-10-CM | POA: Diagnosis not present

## 2021-08-27 NOTE — Progress Notes (Signed)
PROVIDER NOTE: Information contained herein reflects review and annotations entered in association with encounter. Interpretation of such information and data should be left to medically-trained personnel. Information provided to patient can be located elsewhere in the medical record under "Patient Instructions". Document created using STT-dictation technology, any transcriptional errors that may result from process are unintentional.    Patient: Amanda Soto  Service Category: E/M  Provider: Gaspar Cola, MD  DOB: 09-07-1924  DOS: 08/28/2021  Specialty: Interventional Pain Management  MRN: [id number]  Setting: Ambulatory outpatient  PCP: [name], MD  Type: Established Patient    Referring Provider: [name], MD  Location: Office  Delivery: Face-to-face     HPI  [name], a 88 y.o. year old female, is here today because of her Chronic bilateral low back pain without sciatica [M54.50, G89.29]. [name] primary complain today is Back Pain Last encounter: My last encounter with her was on 08/24/2021. Pertinent problems: [name] has Degeneration of intervertebral disc of lumbar region; H/O neoplasm; Chronic low back pain (1ry area of Pain) (Right); Lumbar facet syndrome (Right); Grade 1 Anterolisthesis of L3/L4 and L4/L5 (5 mm); Lumbar spondylosis; Scoliosis of lumbar spine (concave to right side); Lumbar facet arthropathy; Osteoarthritis of hip (Right); Thoracic paracentral T7-8 disc protrusion; Chronic pain syndrome; Chronic sacroiliac joint pain (Right); DDD (degenerative disc disease), lumbar; Spondylosis without myelopathy or radiculopathy, lumbosacral region; Other specified dorsopathies, sacral and sacrococcygeal region; Headache; Chronic hip pain (Left); Chronic elbow pain (Right); Neurogenic pain; Coccygodynia; Traumatic fracture of coccyx, sequela; 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 Pain Assessment: Severity of Chronic pain is reported as a 8 /10. Location: Back Lower, Right, Left/lower to mid back. Onset: More than a month ago. Quality: Aching, Sharp, Discomfort. Timing: Constant. Modifying factor(s): meds. Vitals:  height is $RemoveB'4\' 10"'jbqTGUyK$  (1.473 m) and weight is 101 lb (45.8 kg). Her temperature is 97.2 F (36.2 C) (abnormal). Her blood pressure is 117/69 and her pulse is 74. Her respiration is 16 and oxygen saturation is 99%.   Reason for encounter: evaluation of worsening, or previously known (established) problem.  Today the patient comes in accompanied by her daughter-in-law.  I have gone over the results of her thoracic x-rays and the lumbar CT and I have provided them with printed copies of the official report.  I have also taken the liberty of highlighting in yellow some of the findings such as the osteopenia and facet arthrosis.  I have reminded them that her condition is chronic and will not go away.  I have also reminded them that our job is to manage her pain.  I did touch on the subject of her memory and how her reports of the interventional therapies will change with time.  A perfect example of this would be the trigger point injection done on 07/20/2021 which upon discharge from the clinic on the day of the procedure she indicated that her pain had gone from an 8/10 to a 0/10.  On follow-up evaluation on 08/03/2021 she indicated 0 relief for the duration of the local anesthetic but then came back and said that she  only had 1 day where she was pain-free on 07/25/2021.  Today the  patient reports her pain to be on the right side of her lower back and therefore we will schedule her for a palliative/therapeutic right-sided lumbar facet to block the L3-4 and L4-5 levels.  Thoracic x-rays: Within normal limits Lumbar CT: Lumbar facet arthrosis affecting primarily the L3-4 and L4-5 levels (02/28/2021): Right lumbar facet RFA.  (100/100/70/80) (181 days = 6 months).  RTCB: 02/02/2022  Pharmacotherapy Assessment  Analgesic: Tramadol 50 mg, 1 tablet PO q 6 hrs (200 mg/day of tramadol) MME/day: 20 mg/day.   Monitoring: [place] PMP: PDMP not reviewed this encounter.       Pharmacotherapy: No side-effects or adverse reactions reported. Compliance: No problems identified. Effectiveness: Clinically acceptable.  Ignatius Specking, RN  08/28/2021 10:58 AM  Sign when Signing Visit Safety precautions to be maintained throughout the outpatient stay will include: orient to surroundings, keep bed in low position, maintain call bell within reach at all times, provide assistance with transfer out of bed and ambulation.     UDS:  Summary  Date Value Ref Range Status  10/24/2020 Note  Final    Comment:    ==================================================================== [employer] [facility] (MW) ==================================================================== Test                             Result       Flag       Units  Drug Present and Declared for Prescription Verification   Tramadol                       >6667        EXPECTED   ng/mg creat   O-Desmethyltramadol            >6667        EXPECTED   ng/mg creat   N-Desmethyltramadol            >6667        EXPECTED   ng/mg creat    Source of tramadol is a prescription medication. O-desmethyltramadol    and N-desmethyltramadol are expected metabolites of tramadol.  ==================================================================== Test                      Result    Flag   Units      Ref Range   Creatinine              75                mg/dL      >=20 ==================================================================== Declared Medications:  The flagging and interpretation on this report are based on the  following declared medications.  Unexpected results may arise from  inaccuracies in the declared medications.   **Note: The testing scope of this panel includes these medications:   Tramadol   **Note: The testing scope of this panel does not include the  following reported medications:   Acetaminophen  Apixaban  Cyanocobalamin  Fluticasone  Furosemide  Levothyroxine  Multivitamin  Omeprazole  Sotalol  Vitamin D3 ==================================================================== For clinical consultation, please call [phone]. ====================================================================      ROS  Constitutional: Denies any fever or chills Gastrointestinal: No reported hemesis, hematochezia, vomiting, or acute GI distress Musculoskeletal: Denies any acute onset joint swelling, redness, loss of ROM, or weakness Neurological: No reported episodes of acute onset apraxia, aphasia, dysarthria, agnosia, amnesia, paralysis, loss of  coordination, or loss of consciousness  Medication Review  Calcium Citrate, Multiple Vitamins-Minerals, Vitamin D3, acetaminophen, amiodarone, apixaban, ferrous sulfate, fluticasone, furosemide, levothyroxine, traMADol, and vitamin B-12  History Review  Allergy: [name] is allergic to hydrocodone, ciprofloxacin, and latex. Drug: [name]  reports no history of drug use. Alcohol:  reports current alcohol use of about 1.0 standard drink per week. Tobacco:  reports that she has never smoked. She has never used smokeless tobacco. Social: [name]  reports that she has never smoked. She has never used smokeless tobacco. She reports current alcohol use of about 1.0 standard drink per week. She reports that she does not use drugs. Medical:  has a past medical  history of Acute postoperative pain (12/17/2016), Anemia (11/10/2015), Arthritis, Atrial fibrillation ([place]), CHF (congestive heart failure) ([place]), Chickenpox, Chronic abdominal pain (01/16/2014), Closed Colles' fracture (12/17/2016), Degenerative arthritis of hip (08/19/2014), Degenerative arthritis of lumbar spine (11/18/2013), Depression, Diverticulitis, GERD (gastroesophageal reflux disease), Heart murmur, Heart rate slow, Hyperlipidemia, Hypertension, Hypothyroid, Neuritis or radiculitis due to rupture of lumbar intervertebral disc (11/18/2013), Skin cancer (2016 ?), Symptomatic anemia (11/09/2015), and Trochanteric bursitis of right hip (11/18/2013). Surgical: [name]  has a past surgical history that includes Cataract extraction; Partial hysterectomy; Appendectomy; and Tonsillectomy. Family: family history includes Breast cancer in her mother and sister; Heart disease in an other family member; Hypertension in an other family member; Multiple sclerosis in her sister; Stroke in her maternal grandmother and sister.  Laboratory Chemistry Profile   Renal Lab Results  Component Value Date   BUN 16 08/09/2021   CREATININE 0.59 08/09/2021   LABCREA 90 01/11/2020   BCR 24 (H) 04/26/2020   GFR 77.11 08/09/2021   GFRAA >60 12/18/2019   GFRNONAA >60 12/18/2019    Hepatic Lab Results  Component Value Date   AST 16 08/09/2021   ALT 15 08/09/2021   ALBUMIN 3.5 08/09/2021   ALKPHOS 43 08/09/2021   LIPASE 181 11/02/2013    Electrolytes Lab Results  Component Value Date   NA 133 (L) 08/09/2021   K 3.8 08/09/2021   CL 99 08/09/2021   CALCIUM 9.0 08/09/2021   MG 2.0 03/30/2019   PHOS 3.1 04/26/2020    Bone Lab Results  Component Value Date   VD25OH 28.73 (L) 01/11/2020   25OHVITD1 14 (L) 03/30/2019   25OHVITD2 <1.0 03/30/2019   25OHVITD3 14 03/30/2019    Inflammation (CRP: Acute Phase) (ESR: Chronic Phase) Lab Results  Component Value Date   CRP <1 03/30/2019   ESRSEDRATE 50 (H) 03/30/2019          Note: Above Lab results reviewed.  Recent Imaging Review  DG Thoracic Spine W/Swimmers CLINICAL DATA:  Upper back pain  EXAM: THORACIC SPINE - 3 VIEWS  COMPARISON:  09/08/2018  FINDINGS: Diffuse osseous demineralization. Mild S-shaped scoliotic curvature. Exaggerated thoracic kyphosis. No static listhesis. Vertebral body heights appear maintained without evidence of an acute fracture. Multilevel disc height loss.  IMPRESSION: Osseous demineralization without radiographic evidence of acute fracture or static listhesis in the thoracic spine.  Electronically Signed   By: Davina Poke D.O.   On: 08/22/2021 15:15 CT LUMBAR SPINE WO CONTRAST CLINICAL DATA:  Low back pain with symptoms persisting over 6 weeks. Fall several months ago  EXAM: CT LUMBAR SPINE WITHOUT CONTRAST  TECHNIQUE: Multidetector CT imaging of the lumbar spine was performed without intravenous contrast administration. Multiplanar CT image reconstructions were also generated.  RADIATION DOSE REDUCTION: This exam was performed according to the departmental dose-optimization  program which includes automated exposure control, adjustment of the mA and/or kV according to patient size and/or use of iterative reconstruction technique.  COMPARISON:  Lumbar MRI 03/06/2019  FINDINGS: Segmentation: 5 lumbar type vertebrae.  Alignment: Physiologic  Vertebrae: No acute fracture or focal pathologic process. Prominent generalized osteopenia.  Paraspinal and other soft tissues: Small bilateral pleural effusion. Extensive atheromatous calcification. Sigmoid diverticulosis.  Disc levels: Ordinary disc narrowing and fissuring with minor bulging. Degenerative facet spurring mainly at L3-4 and L4-5. Prominent atrophy of intrinsic back muscles.  IMPRESSION: No acute or subacute finding.  Osteopenia without visible fracture.  Electronically Signed   By: Jorje Guild M.D.   On: 08/22/2021  10:15 Note: Reviewed        Physical Exam  General appearance: Well nourished, well developed, and well hydrated. In no apparent acute distress Mental status: Alert, oriented x 3 (person, place, & time)       Respiratory: No evidence of acute respiratory distress Eyes: PERLA Vitals: BP 117/69    Pulse 74    Temp (!) 97.2 F (36.2 C)    Resp 16    Ht $R'4\' 10"'xo$  (1.473 m)    Wt 101 lb (45.8 kg)    SpO2 99%    BMI 21.11 kg/m  BMI: Estimated body mass index is 21.11 kg/m as calculated from the following:   Height as of this encounter: $RemoveBeforeD'4\' 10"'wXCIOrrmVoPJCr$  (1.473 m).   Weight as of this encounter: 101 lb (45.8 kg). Ideal: Patient must be at least 60 in tall to calculate ideal body weight  Assessment   Status Diagnosis  Controlled Controlled Controlled 1. Chronic low back pain (1ry area of Pain) (Right)   2. Chronic right-sided thoracic back pain   3. Grade 1 Anterolisthesis of L3/L4 and L4/L5 (5 mm)   4. Lumbar facet hypertrophy (Multilevel) (Bilateral)   5. Lumbar facet syndrome (Right)   6. Chronic anticoagulation (Eliquis)      Updated Problems: No problems updated.  Plan of Care  Problem-specific:  No problem-specific Assessment & Plan notes found for this encounter.  [name] has a current medication list which includes the following long-term medication(s): apixaban, calcium citrate, ferrous sulfate, fluticasone, levothyroxine, and tramadol.  Pharmacotherapy (Medications Ordered): No orders of the defined types were placed in this encounter.  Orders:  Orders Placed This Encounter  Procedures   LUMBAR FACET(MEDIAL BRANCH NERVE BLOCK) MBNB    Standing Status:   Future    Standing Expiration Date:   11/25/2021    Scheduling Instructions:     Procedure: Lumbar facet block (AKA.: Lumbosacral medial branch nerve block)     Side: Right-sided     Level: L3-4 & L5-S1 Facets (L2, L3, L4, L5, & S1 Medial Branch Nerves)     Sedation: Patient's choice.     Timeframe: ASAA    Order  Specific Question:   Where will this procedure be performed?    Answer:   [facility]   Blood Thinner Instructions to Nursing    Always make sure patient has clearance from prescribing physician to stop blood thinners for interventional therapies. If the patient requires a Lovenox-bridge therapy, make sure arrangements are made to institute it with the assistance of the PCP.    Scheduling Instructions:     Have [name] stop the Eliquis (Apixaban) x 3 days prior to procedure or surgery.   Follow-up plan:   Return for [facility]) procedure: (R) L-FCT Blk.     Interventional Therapies  Risk   Complexity Considerations:   Estimated body mass index is 22.15 kg/m as calculated from the following:   Height as of this encounter: _0  (1.473 m).   Weight as of this encounter: 106 lb (48.1 kg). NOTE: Eliquis Anticoagulation (Stop:3 days   Restart: 6 hrs)  Advanced age  Latex allergy   Planned   Pending:   Therapeutic right lumbar facet block    Under consideration:   Palliative right lumbar facet RFA #3   Completed:   Diagnostic/therapeutic right erector spinae muscle trigger point x1 (07/20/2021) (0/0/0)  Palliative/therapeutic caudal ESI x1 (07/21/2019) (100/100/90/90)  Palliative right lumbar facet (L2, L3, L4, L5, & S1) MBB x9 (09/07/2020) (100/100/90/75)  Palliative/Therapeutic right lumbar facet RFA x3 (01/28/21) (2nd: 100/100/100/90)  Palliative right sacroiliac joint block x4 (01/24/2021) (100/100/0/0)  Palliative/therapeutic right-sided SI joint RFA x1 (03/27/2018) (100/90/0)    Therapeutic   Palliative (PRN) options:   Palliative/therapeutic caudal ESI #2  Palliative right lumbar facet block #10  Palliative/Therapeutic right lumbar facet RFA #3     Recent Visits Date Type Provider Dept  08/03/21 Office Visit Milinda Pointer, MD [facility]  07/20/21 Office Visit Milinda Pointer, MD [facility]  Showing recent visits within past 90 days and  meeting all other requirements Today's Visits Date Type Provider Dept  08/28/21 Office Visit Milinda Pointer, MD [facility]  Showing today's visits and meeting all other requirements Future Appointments No visits were found meeting these conditions. Showing future appointments within next 90 days and meeting all other requirements  I discussed the assessment and treatment plan with the patient. The patient was provided an opportunity to ask questions and all were answered. The patient agreed with the plan and demonstrated an understanding of the instructions.  Patient advised to call back or seek an in-person evaluation if the symptoms or condition worsens.  Duration of encounter: 30 minutes.  Note by: Gaspar Cola, MD Date: 08/28/2021; Time: 11:26 AM

## 2021-08-28 ENCOUNTER — Other Ambulatory Visit: Payer: Self-pay

## 2021-08-28 ENCOUNTER — Ambulatory Visit: Payer: Medicare Other | Attending: Pain Medicine | Admitting: Pain Medicine

## 2021-08-28 VITALS — BP 117/69 | HR 74 | Temp 97.2°F | Resp 16 | Ht <= 58 in | Wt 101.0 lb

## 2021-08-28 DIAGNOSIS — M545 Low back pain, unspecified: Secondary | ICD-10-CM

## 2021-08-28 DIAGNOSIS — Z7901 Long term (current) use of anticoagulants: Secondary | ICD-10-CM | POA: Diagnosis not present

## 2021-08-28 DIAGNOSIS — M47816 Spondylosis without myelopathy or radiculopathy, lumbar region: Secondary | ICD-10-CM | POA: Diagnosis not present

## 2021-08-28 DIAGNOSIS — M4310 Spondylolisthesis, site unspecified: Secondary | ICD-10-CM | POA: Diagnosis not present

## 2021-08-28 DIAGNOSIS — G8929 Other chronic pain: Secondary | ICD-10-CM

## 2021-08-28 DIAGNOSIS — M546 Pain in thoracic spine: Secondary | ICD-10-CM | POA: Diagnosis not present

## 2021-08-28 NOTE — Progress Notes (Signed)
Safety precautions to be maintained throughout the outpatient stay will include: orient to surroundings, keep bed in low position, maintain call bell within reach at all times, provide assistance with transfer out of bed and ambulation.  

## 2021-08-28 NOTE — Patient Instructions (Addendum)
______________________________________________________________________ ° °Preparing for Procedure with Sedation ° °NOTICE: Due to recent regulatory changes, starting on January 30, 2021, procedures requiring intravenous (IV) sedation will no longer be performed at the [facility].  These types of procedures are required to be performed at [facility] ambulatory surgery facility.  We are very sorry for the inconvenience. ° °Procedure appointments are limited to planned procedures: °No Prescription Refills. °No disability issues will be discussed. °No medication changes will be discussed. ° °Instructions: °Oral Intake: Do not eat or drink anything for at least 8 hours prior to your procedure. (Exception: Blood Pressure Medication. See below.) °Transportation: A driver is required. You may not drive yourself after the procedure. °Blood Pressure Medicine: Do not forget to take your blood pressure medicine with a sip of water the morning of the procedure. If your Diastolic (lower reading) is above 100 mmHg, elective cases will be cancelled/rescheduled. °Blood thinners: These will need to be stopped for procedures. Notify our staff if you are taking any blood thinners. Depending on which one you take, there will be specific instructions on how and when to stop it. °Diabetics on insulin: Notify the staff so that you can be scheduled 1st case in the morning. If your diabetes requires high dose insulin, take only ½ of your normal insulin dose the morning of the procedure and notify the staff that you have done so. °Preventing infections: Shower with an antibacterial soap the morning of your procedure. °Build-up your immune system: Take 1000 mg of Vitamin C with every meal (3 times a day) the day prior to your procedure. °Antibiotics: Inform the staff if you have a condition or reason that requires you to take antibiotics before dental procedures. °Pregnancy: If you are pregnant, call and cancel the procedure. °Sickness: If  you have a cold, fever, or any active infections, call and cancel the procedure. °Arrival: You must be in the facility at least 30 minutes prior to your scheduled procedure. °Children: Do not bring children with you. °Dress appropriately: Bring dark clothing that you would not mind if they get stained. °Valuables: Do not bring any jewelry or valuables. ° °Reasons to call and reschedule or cancel your procedure: (Following these recommendations will minimize the risk of a serious complication.) °Surgeries: Avoid having procedures within 2 weeks of any surgery. (Avoid for 2 weeks before or after any surgery). °Flu Shots: Avoid having procedures within 2 weeks of a flu shots. (Avoid for 2 weeks before or after immunizations). °Barium: Avoid having a procedure within 7-10 days after having had a radiological study involving the use of radiological contrast. (Myelograms, Barium swallow or enema study). °Heart attacks: Avoid any elective procedures or surgeries for the initial 6 months after a "Myocardial Infarction" (Heart Attack). °Blood thinners: It is imperative that you stop these medications before procedures. Let us know if you if you take any blood thinner.  °Infection: Avoid procedures during or within two weeks of an infection (including chest colds or gastrointestinal problems). Symptoms associated with infections include: Localized redness, fever, chills, night sweats or profuse sweating, burning sensation when voiding, cough, congestion, stuffiness, runny nose, sore throat, diarrhea, nausea, vomiting, cold or Flu symptoms, recent or current infections. It is specially important if the infection is over the area that we intend to treat. °Heart and lung problems: Symptoms that may suggest an active cardiopulmonary problem include: cough, chest pain, breathing difficulties or shortness of breath, dizziness, ankle swelling, uncontrolled high or unusually low blood pressure, and/or palpitations. If you are    experiencing any of these symptoms, cancel your procedure and contact your primary care physician for an evaluation. ° °Remember:  °Regular Business hours are:  °Monday to Thursday 8:00 AM to 4:00 PM ° °Provider's Schedule: °Francisco Naveira, MD:  °Procedure days: Tuesday and Thursday 7:30 AM to 4:00 PM ° °Bilal Lateef, MD:  °Procedure days: Monday and Wednesday 7:30 AM to 4:00 PM °______________________________________________________________________ ° ____________________________________________________________________________________________ ° °General Risks and Possible Complications ° °Patient Responsibilities: It is important that you read this as it is part of your informed consent. It is our duty to inform you of the risks and possible complications associated with treatments offered to you. It is your responsibility as a patient to read this and to ask questions about anything that is not clear or that you believe was not covered in this document. ° °Patient’s Rights: You have the right to refuse treatment. You also have the right to change your mind, even after initially having agreed to have the treatment done. However, under this last option, if you wait until the last second to change your mind, you may be charged for the materials used up to that point. ° °Introduction: Medicine is not an exact science. Everything in Medicine, including the lack of treatment(s), carries the potential for danger, harm, or loss (which is by definition: Risk). In Medicine, a complication is a secondary problem, condition, or disease that can aggravate an already existing one. All treatments carry the risk of possible complications. The fact that a side effects or complications occurs, does not imply that the treatment was conducted incorrectly. It must be clearly understood that these can happen even when everything is done following the highest safety standards. ° °No treatment: You can choose not to proceed with the  proposed treatment alternative. The “PRO(s)” would include: avoiding the risk of complications associated with the therapy. The “CON(s)” would include: not getting any of the treatment benefits. These benefits fall under one of three categories: diagnostic; therapeutic; and/or palliative. Diagnostic benefits include: getting information which can ultimately lead to improvement of the disease or symptom(s). Therapeutic benefits are those associated with the successful treatment of the disease. Finally, palliative benefits are those related to the decrease of the primary symptoms, without necessarily curing the condition (example: decreasing the pain from a flare-up of a chronic condition, such as incurable terminal cancer). ° °General Risks and Complications: These are associated to most interventional treatments. They can occur alone, or in combination. They fall under one of the following six (6) categories: no benefit or worsening of symptoms; bleeding; infection; nerve damage; allergic reactions; and/or death. °No benefits or worsening of symptoms: In Medicine there are no guarantees, only probabilities. No healthcare provider can ever guarantee that a medical treatment will work, they can only state the probability that it may. Furthermore, there is always the possibility that the condition may worsen, either directly, or indirectly, as a consequence of the treatment. °Bleeding: This is more common if the patient is taking a blood thinner, either prescription or over the counter (example: Goody Powders, Fish oil, Aspirin, Garlic, etc.), or if suffering a condition associated with impaired coagulation (example: Hemophilia, cirrhosis of the liver, low platelet counts, etc.). However, even if you do not have one on these, it can still happen. If you have any of these conditions, or take one of these drugs, make sure to notify your treating physician. °Infection: This is more common in patients with a compromised  immune system, either due to disease (example:   diabetes, cancer, human immunodeficiency virus [HIV], etc.), or due to medications or treatments (example: therapies used to treat cancer and rheumatological diseases). However, even if you do not have one on these, it can still happen. If you have any of these conditions, or take one of these drugs, make sure to notify your treating physician. Nerve Damage: This is more common when the treatment is an invasive one, but it can also happen with the use of medications, such as those used in the treatment of cancer. The damage can occur to small secondary nerves, or to large primary ones, such as those in the spinal cord and brain. This damage may be temporary or permanent and it may lead to impairments that can range from temporary numbness to permanent paralysis and/or brain death. Allergic Reactions: Any time a substance or material comes in contact with our body, there is the possibility of an allergic reaction. These can range from a mild skin rash (contact dermatitis) to a severe systemic reaction (anaphylactic reaction), which can result in death. Death: In general, any medical intervention can result in death, most of the time due to an unforeseen complication. ____________________________________________________________________________________________ ______________________________________________________________________  Preparing for Procedure with Sedation  NOTICE: Due to recent regulatory changes, starting on January 30, 2021, procedures requiring intravenous (IV) sedation will no longer be performed at the [facility].  These types of procedures are required to be performed at [facility] ambulatory surgery facility.  We are very sorry for the inconvenience.  Procedure appointments are limited to planned procedures: No Prescription Refills. No disability issues will be discussed. No medication changes will be discussed.  Instructions: Oral  Intake: Do not eat or drink anything for at least 8 hours prior to your procedure. (Exception: Blood Pressure Medication. See below.) Transportation: A driver is required. You may not drive yourself after the procedure. Blood Pressure Medicine: Do not forget to take your blood pressure medicine with a sip of water the morning of the procedure. If your Diastolic (lower reading) is above 100 mmHg, elective cases will be cancelled/rescheduled. Blood thinners: These will need to be stopped for procedures. Notify our staff if you are taking any blood thinners. Depending on which one you take, there will be specific instructions on how and when to stop it. Diabetics on insulin: Notify the staff so that you can be scheduled 1st case in the morning. If your diabetes requires high dose insulin, take only  of your normal insulin dose the morning of the procedure and notify the staff that you have done so. Preventing infections: Shower with an antibacterial soap the morning of your procedure. Build-up your immune system: Take 1000 mg of Vitamin C with every meal (3 times a day) the day prior to your procedure. Antibiotics: Inform the staff if you have a condition or reason that requires you to take antibiotics before dental procedures. Pregnancy: If you are pregnant, call and cancel the procedure. Sickness: If you have a cold, fever, or any active infections, call and cancel the procedure. Arrival: You must be in the facility at least 30 minutes prior to your scheduled procedure. Children: Do not bring children with you. Dress appropriately: Bring dark clothing that you would not mind if they get stained. Valuables: Do not bring any jewelry or valuables.  Reasons to call and reschedule or cancel your procedure: (Following these recommendations will minimize the risk of a serious complication.) Surgeries: Avoid having procedures within 2 weeks of any surgery. (Avoid for 2 weeks before  or after any  surgery). Flu Shots: Avoid having procedures within 2 weeks of a flu shots. (Avoid for 2 weeks before or after immunizations). Barium: Avoid having a procedure within 7-10 days after having had a radiological study involving the use of radiological contrast. (Myelograms, Barium swallow or enema study). Heart attacks: Avoid any elective procedures or surgeries for the initial 6 months after a "Myocardial Infarction" (Heart Attack). Blood thinners: It is imperative that you stop these medications before procedures. Let us know if you if you take any blood thinner.  Infection: Avoid procedures during or within two weeks of an infection (including chest colds or gastrointestinal problems). Symptoms associated with infections include: Localized redness, fever, chills, night sweats or profuse sweating, burning sensation when voiding, cough, congestion, stuffiness, runny nose, sore throat, diarrhea, nausea, vomiting, cold or Flu symptoms, recent or current infections. It is specially important if the infection is over the area that we intend to treat. Heart and lung problems: Symptoms that may suggest an active cardiopulmonary problem include: cough, chest pain, breathing difficulties or shortness of breath, dizziness, ankle swelling, uncontrolled high or unusually low blood pressure, and/or palpitations. If you are experiencing any of these symptoms, cancel your procedure and contact your primary care physician for an evaluation.  Remember:  Regular Business hours are:  Monday to Thursday 8:00 AM to 4:00 PM  Provider's Schedule: Milinda Pointer, MD:  Procedure days: Tuesday and Thursday 7:30 AM to 4:00 PM  Gillis Santa, MD:  Procedure days: Monday and Wednesday 7:30 AM to 4:00 PM ______________________________________________________________________

## 2021-09-01 DIAGNOSIS — Z9181 History of falling: Secondary | ICD-10-CM | POA: Diagnosis not present

## 2021-09-01 DIAGNOSIS — D649 Anemia, unspecified: Secondary | ICD-10-CM | POA: Diagnosis not present

## 2021-09-01 DIAGNOSIS — G8929 Other chronic pain: Secondary | ICD-10-CM | POA: Diagnosis not present

## 2021-09-01 DIAGNOSIS — M5136 Other intervertebral disc degeneration, lumbar region: Secondary | ICD-10-CM | POA: Diagnosis not present

## 2021-09-01 DIAGNOSIS — I051 Rheumatic mitral insufficiency: Secondary | ICD-10-CM | POA: Diagnosis not present

## 2021-09-01 DIAGNOSIS — M47817 Spondylosis without myelopathy or radiculopathy, lumbosacral region: Secondary | ICD-10-CM | POA: Diagnosis not present

## 2021-09-01 DIAGNOSIS — M545 Low back pain, unspecified: Secondary | ICD-10-CM | POA: Diagnosis not present

## 2021-09-01 DIAGNOSIS — E785 Hyperlipidemia, unspecified: Secondary | ICD-10-CM | POA: Diagnosis not present

## 2021-09-01 DIAGNOSIS — I4891 Unspecified atrial fibrillation: Secondary | ICD-10-CM | POA: Diagnosis not present

## 2021-09-01 DIAGNOSIS — Z6822 Body mass index (BMI) 22.0-22.9, adult: Secondary | ICD-10-CM | POA: Diagnosis not present

## 2021-09-01 DIAGNOSIS — I509 Heart failure, unspecified: Secondary | ICD-10-CM | POA: Diagnosis not present

## 2021-09-01 DIAGNOSIS — K219 Gastro-esophageal reflux disease without esophagitis: Secondary | ICD-10-CM | POA: Diagnosis not present

## 2021-09-01 DIAGNOSIS — M810 Age-related osteoporosis without current pathological fracture: Secondary | ICD-10-CM | POA: Diagnosis not present

## 2021-09-01 DIAGNOSIS — F32A Depression, unspecified: Secondary | ICD-10-CM | POA: Diagnosis not present

## 2021-09-01 DIAGNOSIS — I110 Hypertensive heart disease with heart failure: Secondary | ICD-10-CM | POA: Diagnosis not present

## 2021-09-01 DIAGNOSIS — R7303 Prediabetes: Secondary | ICD-10-CM | POA: Diagnosis not present

## 2021-09-01 DIAGNOSIS — M1610 Unilateral primary osteoarthritis, unspecified hip: Secondary | ICD-10-CM | POA: Diagnosis not present

## 2021-09-01 DIAGNOSIS — E039 Hypothyroidism, unspecified: Secondary | ICD-10-CM | POA: Diagnosis not present

## 2021-09-01 DIAGNOSIS — M546 Pain in thoracic spine: Secondary | ICD-10-CM | POA: Diagnosis not present

## 2021-09-12 ENCOUNTER — Other Ambulatory Visit: Payer: Self-pay

## 2021-09-12 ENCOUNTER — Ambulatory Visit: Payer: Medicare Other

## 2021-09-12 ENCOUNTER — Ambulatory Visit (HOSPITAL_BASED_OUTPATIENT_CLINIC_OR_DEPARTMENT_OTHER): Payer: Medicare Other | Admitting: Pain Medicine

## 2021-09-12 ENCOUNTER — Ambulatory Visit
Admission: RE | Admit: 2021-09-12 | Discharge: 2021-09-12 | Disposition: A | Discharge status: Home / Self Care | Payer: Medicare Other | Source: Ambulatory Visit | Attending: Pain Medicine | Admitting: Pain Medicine

## 2021-09-12 ENCOUNTER — Encounter: Payer: Self-pay | Admitting: Pain Medicine

## 2021-09-12 VITALS — BP 121/83 | HR 87 | Temp 97.3°F | Resp 27 | Ht <= 58 in | Wt 101.0 lb

## 2021-09-12 DIAGNOSIS — M47817 Spondylosis without myelopathy or radiculopathy, lumbosacral region: Secondary | ICD-10-CM | POA: Diagnosis not present

## 2021-09-12 DIAGNOSIS — Z7901 Long term (current) use of anticoagulants: Secondary | ICD-10-CM

## 2021-09-12 DIAGNOSIS — R208 Other disturbances of skin sensation: Secondary | ICD-10-CM | POA: Insufficient documentation

## 2021-09-12 DIAGNOSIS — M5136 Other intervertebral disc degeneration, lumbar region: Secondary | ICD-10-CM

## 2021-09-12 DIAGNOSIS — M546 Pain in thoracic spine: Secondary | ICD-10-CM | POA: Insufficient documentation

## 2021-09-12 DIAGNOSIS — M47816 Spondylosis without myelopathy or radiculopathy, lumbar region: Secondary | ICD-10-CM | POA: Diagnosis not present

## 2021-09-12 DIAGNOSIS — G8929 Other chronic pain: Secondary | ICD-10-CM | POA: Diagnosis not present

## 2021-09-12 DIAGNOSIS — Z91040 Latex allergy status: Secondary | ICD-10-CM | POA: Diagnosis not present

## 2021-09-12 DIAGNOSIS — R937 Abnormal findings on diagnostic imaging of other parts of musculoskeletal system: Secondary | ICD-10-CM | POA: Insufficient documentation

## 2021-09-12 DIAGNOSIS — M545 Low back pain, unspecified: Secondary | ICD-10-CM | POA: Insufficient documentation

## 2021-09-12 DIAGNOSIS — M4310 Spondylolisthesis, site unspecified: Secondary | ICD-10-CM | POA: Insufficient documentation

## 2021-09-12 DIAGNOSIS — M51369 Other intervertebral disc degeneration, lumbar region without mention of lumbar back pain or lower extremity pain: Secondary | ICD-10-CM

## 2021-09-12 MED ORDER — TRIAMCINOLONE ACETONIDE 40 MG/ML IJ SUSP
INTRAMUSCULAR | Status: AC
  Filled 2021-09-12: qty 1

## 2021-09-12 MED ORDER — ROPIVACAINE HCL 2 MG/ML IJ SOLN
9.0000 mL | Freq: Once | INTRAMUSCULAR | Status: AC
  Administered 2021-09-12: 9 mL via PERINEURAL

## 2021-09-12 MED ORDER — ROPIVACAINE HCL 2 MG/ML IJ SOLN
INTRAMUSCULAR | Status: AC
  Filled 2021-09-12: qty 20

## 2021-09-12 MED ORDER — LIDOCAINE 5 % EX OINT: 1.0000 "application " | TOPICAL_OINTMENT | Freq: Four times a day (QID) | CUTANEOUS | 2 refills | Status: AC | PRN

## 2021-09-12 MED ORDER — TRIAMCINOLONE ACETONIDE 40 MG/ML IJ SUSP
40.0000 mg | Freq: Once | INTRAMUSCULAR | Status: AC
  Administered 2021-09-12: 40 mg

## 2021-09-12 MED ORDER — LIDOCAINE HCL 2 % IJ SOLN
20.0000 mL | Freq: Once | INTRAMUSCULAR | Status: AC
  Administered 2021-09-12: 400 mg

## 2021-09-12 MED ORDER — MIDAZOLAM HCL 5 MG/5ML IJ SOLN
INTRAMUSCULAR | Status: AC
  Filled 2021-09-12: qty 5

## 2021-09-12 MED ORDER — LIDOCAINE HCL 2 % IJ SOLN
INTRAMUSCULAR | Status: AC
  Filled 2021-09-12: qty 20

## 2021-09-12 MED ORDER — PENTAFLUOROPROP-TETRAFLUOROETH EX AERO
INHALATION_SPRAY | Freq: Once | CUTANEOUS | Status: AC
  Filled 2021-09-12: qty 116

## 2021-09-12 NOTE — Progress Notes (Signed)
PROVIDER NOTE: Interpretation of information contained herein should be left to medically-trained personnel. Specific patient instructions are provided elsewhere under "Patient Instructions" section of medical record. This document was created in part using STT-dictation technology, any transcriptional errors that may result from this process are unintentional.  Patient: Amanda Soto Type: Established DOB: September 23, 1924 MRN: [id number] PCP: Glori Luis, MD  Service: Procedure DOS: 09/12/2021 Setting: Ambulatory Location: Ambulatory outpatient facility Delivery: Face-to-face Provider: Oswaldo Done, MD Specialty: Interventional Pain Management Specialty designation: 09 Location: Outpatient facility Ref. Prov.: Delano Metz, MD    Primary Reason for Visit: Interventional Pain Management Treatment. CC: Back Pain (Low and mid on the right side)   Procedure:           Type: Lumbar Facet, Medial Branch Block(s)  #10   Laterality: Right  Level: L2, L3, L4, L5, & S1 Medial Branch Level(s). Injecting these levels blocks the L3-4 and L5-S1 lumbar facet joints. Imaging: Fluoroscopic guidance Anesthesia: Local anesthesia (1-2% Lidocaine) Anxiolysis: None                 Sedation: None. DOS: 09/12/2021 Performed by: Oswaldo Done, MD  Primary Purpose: Diagnostic/Therapeutic Indications: Low back pain severe enough to impact quality of life or function. 1. Lumbar facet syndrome (Right)   2. Grade 1 Anterolisthesis of L3/L4 and L4/L5 (5 mm)   3. Lumbar facet hypertrophy (Multilevel) (Bilateral)   4. Lumbar facet arthropathy   5. Spondylosis without myelopathy or radiculopathy, lumbosacral region   6. DDD (degenerative disc disease), lumbar   7. Chronic low back pain (1ry area of Pain) (Right)   8. Abnormal MRI, lumbar spine (03/06/2019)    Chronic anticoagulation (Eliquis)    Latex precautions, history of latex allergy    NAS-11 Pain score:   Pre-procedure: 8 /10    Post-procedure: 1 /10     Position / Prep / Materials:  Position: Prone  Prep solution: DuraPrep (Iodine Povacrylex [0.7% available iodine] and Isopropyl Alcohol, 74% w/w) Area Prepped: Posterolateral Lumbosacral Spine (Wide prep: From the lower border of the scapula down to the end of the tailbone and from flank to flank.)  Materials:  Tray: Block Needle(s):  Type: Spinal  Gauge (G): 22  Length: 5-in Qty: 4  Pre-op H&P Assessment:  Ms. Carothers is a 88 y.o. (year old), female patient, seen today for interventional treatment. She  has a past surgical history that includes Cataract extraction; Partial hysterectomy; Appendectomy; and Tonsillectomy. Ms. Amann has a current medication list which includes the following prescription(s): acetaminophen, amiodarone, apixaban, calcium citrate, vitamin d3, ferrous sulfate, fluticasone, furosemide, levothyroxine, lidocaine, multiple vitamins-minerals, tramadol, and vitamin b-12, and the following Facility-Administered Medications: lidocaine, pentafluoroprop-tetrafluoroeth, ropivacaine (pf) 2 mg/ml (0.2%), and triamcinolone acetonide. Her primarily concern today is the Back Pain (Low and mid on the right side)  Initial Vital Signs:  Pulse/HCG Rate: 89  Temp: (!) 97.3 F (36.3 C) Resp: 18 BP: 120/67 SpO2: 97 %  BMI: Estimated body mass index is 21.86 kg/m as calculated from the following:   Height as of this encounter: 4\' 9"  (1.448 m).   Weight as of this encounter: 101 lb (45.8 kg).  Risk Assessment: Allergies: Reviewed. She is allergic to hydrocodone, ciprofloxacin, and latex.  Allergy Precautions: None required Coagulopathies: Reviewed. None identified.  Blood-thinner therapy: None at this time Active Infection(s): Reviewed. None identified. Ms. Desouza is afebrile  Site Confirmation: Ms. Streeper was asked to confirm the procedure and laterality before marking the site Procedure checklist: Completed  Consent: Before the procedure and under  the influence of no sedative(s), amnesic(s), or anxiolytics, the patient was informed of the treatment options, risks and possible complications. To fulfill our ethical and legal obligations, as recommended by the American Medical Association's Code of Ethics, I have informed the patient of my clinical impression; the nature and purpose of the treatment or procedure; the risks, benefits, and possible complications of the intervention; the alternatives, including doing nothing; the risk(s) and benefit(s) of the alternative treatment(s) or procedure(s); and the risk(s) and benefit(s) of doing nothing. The patient was provided information about the general risks and possible complications associated with the procedure. These may include, but are not limited to: failure to achieve desired goals, infection, bleeding, organ or nerve damage, allergic reactions, paralysis, and death. In addition, the patient was informed of those risks and complications associated to Spine-related procedures, such as failure to decrease pain; infection (i.e.: Meningitis, epidural or intraspinal abscess); bleeding (i.e.: epidural hematoma, subarachnoid hemorrhage, or any other type of intraspinal or peri-dural bleeding); organ or nerve damage (i.e.: Any type of peripheral nerve, nerve root, or spinal cord injury) with subsequent damage to sensory, motor, and/or autonomic systems, resulting in permanent pain, numbness, and/or weakness of one or several areas of the body; allergic reactions; (i.e.: anaphylactic reaction); and/or death. Furthermore, the patient was informed of those risks and complications associated with the medications. These include, but are not limited to: allergic reactions (i.e.: anaphylactic or anaphylactoid reaction(s)); adrenal axis suppression; blood sugar elevation that in diabetics may result in ketoacidosis or comma; water retention that in patients with history of congestive heart failure may result in  shortness of breath, pulmonary edema, and decompensation with resultant heart failure; weight gain; swelling or edema; medication-induced neural toxicity; particulate matter embolism and blood vessel occlusion with resultant organ, and/or nervous system infarction; and/or aseptic necrosis of one or more joints. Finally, the patient was informed that Medicine is not an exact science; therefore, there is also the possibility of unforeseen or unpredictable risks and/or possible complications that may result in a catastrophic outcome. The patient indicated having understood very clearly. We have given the patient no guarantees and we have made no promises. Enough time was given to the patient to ask questions, all of which were answered to the patient's satisfaction. Ms. Goodlet has indicated that she wanted to continue with the procedure. Attestation: I, the ordering provider, attest that I have discussed with the patient the benefits, risks, side-effects, alternatives, likelihood of achieving goals, and potential problems during recovery for the procedure that I have provided informed consent. Date  Time: 09/12/2021 10:29 AM  Pre-Procedure Preparation:  Monitoring: As per clinic protocol. Respiration, ETCO2, SpO2, BP, heart rate and rhythm monitor placed and checked for adequate function Safety Precautions: Patient was assessed for positional comfort and pressure points before starting the procedure. Time-out: I initiated and conducted the "Time-out" before starting the procedure, as per protocol. The patient was asked to participate by confirming the accuracy of the "Time Out" information. Verification of the correct person, site, and procedure were performed and confirmed by me, the nursing staff, and the patient. "Time-out" conducted as per Joint Commission's Universal Protocol (UP.01.01.01). Time: 1055  Description of Procedure:          Laterality: Right Targeted Levels:  L2, L3, L4, L5, & S1 Medial  Branch Level(s)  Safety Precautions: Aspiration looking for blood return was conducted prior to all injections. At no point did we inject any substances, as a  needle was being advanced. Before injecting, the patient was told to immediately notify me if she was experiencing any new onset of "ringing in the ears, or metallic taste in the mouth". No attempts were made at seeking any paresthesias. Safe injection practices and needle disposal techniques used. Medications properly checked for expiration dates. SDV (single dose vial) medications used. After the completion of the procedure, all disposable equipment used was discarded in the proper designated medical waste containers. Local Anesthesia: Protocol guidelines were followed. The patient was positioned over the fluoroscopy table. The area was prepped in the usual manner. The time-out was completed. The target area was identified using fluoroscopy. A 12-in long, straight, sterile hemostat was used with fluoroscopic guidance to locate the targets for each level blocked. Once located, the skin was marked with an approved surgical skin marker. Once all sites were marked, the skin (epidermis, dermis, and hypodermis), as well as deeper tissues (fat, connective tissue and muscle) were infiltrated with a small amount of a short-acting local anesthetic, loaded on a 10cc syringe with a 25G, 1.5-in  Needle. An appropriate amount of time was allowed for local anesthetics to take effect before proceeding to the next step. Local Anesthetic: Lidocaine 2.0% The unused portion of the local anesthetic was discarded in the proper designated containers. Technical description of process:  L2 Medial Branch Nerve Block (MBB): The target area for the L2 medial branch is at the junction of the postero-lateral aspect of the superior articular process and the superior, posterior, and medial edge of the transverse process of L3. Under fluoroscopic guidance, a Quincke needle was  inserted until contact was made with os over the superior postero-lateral aspect of the pedicular shadow (target area). After negative aspiration for blood, 0.5 mL of the nerve block solution was injected without difficulty or complication. The needle was removed intact. L3 Medial Branch Nerve Block (MBB): The target area for the L3 medial branch is at the junction of the postero-lateral aspect of the superior articular process and the superior, posterior, and medial edge of the transverse process of L4. Under fluoroscopic guidance, a Quincke needle was inserted until contact was made with os over the superior postero-lateral aspect of the pedicular shadow (target area). After negative aspiration for blood, 0.5 mL of the nerve block solution was injected without difficulty or complication. The needle was removed intact. L4 Medial Branch Nerve Block (MBB): The target area for the L4 medial branch is at the junction of the postero-lateral aspect of the superior articular process and the superior, posterior, and medial edge of the transverse process of L5. Under fluoroscopic guidance, a Quincke needle was inserted until contact was made with os over the superior postero-lateral aspect of the pedicular shadow (target area). After negative aspiration for blood, 0.5 mL of the nerve block solution was injected without difficulty or complication. The needle was removed intact. L5 Medial Branch Nerve Block (MBB): The target area for the L5 medial branch is at the junction of the postero-lateral aspect of the superior articular process and the superior, posterior, and medial edge of the sacral ala. Under fluoroscopic guidance, a Quincke needle was inserted until contact was made with os over the superior postero-lateral aspect of the pedicular shadow (target area). After negative aspiration for blood, 0.5 mL of the nerve block solution was injected without difficulty or complication. The needle was removed intact. S1  Medial Branch Nerve Block (MBB): The target area for the S1 medial branch is at the posterior and inferior  6 o'clock position of the L5-S1 facet joint. Under fluoroscopic guidance, the Quincke needle inserted for the L5 MBB was redirected until contact was made with os over the inferior and postero aspect of the sacrum, at the 6 o' clock position under the L5-S1 facet joint (Target area). After negative aspiration for blood, 0.5 mL of the nerve block solution was injected without difficulty or complication. The needle was removed intact.  Once the entire procedure was completed, the treated area was cleaned, making sure to leave some of the prepping solution back to take advantage of its long term bactericidal properties.      Illustration of the posterior view of the lumbar spine and the posterior neural structures. Laminae of L2 through S1 are labeled. DPRL5, dorsal primary ramus of L5; DPRS1, dorsal primary ramus of S1; DPR3, dorsal primary ramus of L3; FJ, facet (zygapophyseal) joint L3-L4; I, inferior articular process of L4; LB1, lateral branch of dorsal primary ramus of L1; IAB, inferior articular branches from L3 medial branch (supplies L4-L5 facet joint); IBP, intermediate branch plexus; MB3, medial branch of dorsal primary ramus of L3; NR3, third lumbar nerve root; S, superior articular process of L5; SAB, superior articular branches from L4 (supplies L4-5 facet joint also); TP3, transverse process of L3.  Vitals:   09/12/21 1027 09/12/21 1050 09/12/21 1055 09/12/21 1100  BP: 120/67 123/71 119/81 121/83  Pulse: 89 93 89 87  Resp: 18 13 20  (!) 27  Temp: (!) 97.3 F (36.3 C)     TempSrc: Temporal     SpO2: 97% 98% 100% 98%  Weight: 101 lb (45.8 kg)     Height: 4\' 9"  (1.448 m)        Start Time: 1055 hrs. End Time: 1059 hrs.  Imaging Guidance (Spinal):          Type of Imaging Technique: Fluoroscopy Guidance (Spinal) Indication(s): Assistance in needle guidance and placement for  procedures requiring needle placement in or near specific anatomical locations not easily accessible without such assistance. Exposure Time: Please see nurses notes. Contrast: None used. Fluoroscopic Guidance: I was personally present during the use of fluoroscopy. "Tunnel Vision Technique" used to obtain the best possible view of the target area. Parallax error corrected before commencing the procedure. "Direction-depth-direction" technique used to introduce the needle under continuous pulsed fluoroscopy. Once target was reached, antero-posterior, oblique, and lateral fluoroscopic projection used confirm needle placement in all planes. Images permanently stored in EMR. Interpretation: No contrast injected. I personally interpreted the imaging intraoperatively. Adequate needle placement confirmed in multiple planes. Permanent images saved into the patient's record.  Antibiotic Prophylaxis:   Anti-infectives (From admission, onward)    None      Indication(s): None identified  Post-operative Assessment:  Post-procedure Vital Signs:  Pulse/HCG Rate: 87 (afib)  Temp: (!) 97.3 F (36.3 C) Resp: (!) 27 BP: 121/83 SpO2: 98 %  EBL: None  Complications: No immediate post-treatment complications observed by team, or reported by patient.  Note: The patient tolerated the entire procedure well. A repeat set of vitals were taken after the procedure and the patient was kept under observation following institutional policy, for this type of procedure. Post-procedural neurological assessment was performed, showing return to baseline, prior to discharge. The patient was provided with post-procedure discharge instructions, including a section on how to identify potential problems. Should any problems arise concerning this procedure, the patient was given instructions to immediately contact us, at any time, without hesitation. In any case, we plan to contact  the patient by telephone for a follow-up status  report regarding this interventional procedure.  Comments:  No additional relevant information.  Plan of Care  Orders:  Orders Placed This Encounter  Procedures   LUMBAR FACET(MEDIAL BRANCH NERVE BLOCK) MBNB    Scheduling Instructions:     Procedure: Lumbar facet block (AKA.: Lumbosacral medial branch nerve block)     Side: Right-sided     Level: L3-4 & L5-S1 Facets (L2, L3, L4, L5, & S1 Medial Branch Nerves)     Sedation: Patient's choice.     Timeframe: Today    Order Specific Question:   Where will this procedure be performed?    Answer:   [facility]   [facility] C-ARM 1-60 MIN NO REPORT    Intraoperative interpretation by procedural physician at [facility].    Standing Status:   Standing    Number of Occurrences:   1    Order Specific Question:   Reason for exam:    Answer:   Assistance in needle guidance and placement for procedures requiring needle placement in or near specific anatomical locations not easily accessible without such assistance.   Informed Consent Details: Physician/Practitioner Attestation; Transcribe to consent form and obtain patient signature    Nursing Order: Transcribe to consent form and obtain patient signature. Note: Always confirm laterality of pain with Ms. Schutter, before procedure.    Order Specific Question:   Physician/Practitioner attestation of informed consent for procedure/surgical case    Answer:   I, the physician/practitioner, attest that I have discussed with the patient the benefits, risks, side effects, alternatives, likelihood of achieving goals and potential problems during recovery for the procedure that I have provided informed consent.    Order Specific Question:   Procedure    Answer:   Lumbar Facet Block  under fluoroscopic guidance    Order Specific Question:   Physician/Practitioner performing the procedure    Answer:   Pauline Pegues A. Laban Emperor MD    Order Specific Question:   Indication/Reason    Answer:    Low Back Pain, with our without leg pain, due to Facet Joint Arthralgia (Joint Pain) Spondylosis (Arthritis of the Spine), without myelopathy or radiculopathy (Nerve Damage).   Care order/instruction: Please confirm that the patient has stopped the Eliquis (Apixaban) x 3 days prior to procedure or surgery.    Please confirm that the patient has stopped the Eliquis (Apixaban) x 3 days prior to procedure or surgery.    Standing Status:   Standing    Number of Occurrences:   1   Provide equipment / supplies at bedside    "Block Tray" (Disposable  single use) Needle type: SpinalSpinal Amount/quantity: 4 Size: Regular (3.5-inch) Gauge: 22G    Standing Status:   Standing    Number of Occurrences:   1    Order Specific Question:   Specify    Answer:   Block Tray   Bleeding precautions    Standing Status:   Standing    Number of Occurrences:   1   Latex precautions    Activate Latex-Free Protocol.    Standing Status:   Standing    Number of Occurrences:   1   Chronic Opioid Analgesic:  Tramadol 50 mg, 1 tablet PO q 6 hrs (200 mg/day of tramadol) MME/day: 20 mg/day.   Medications ordered for procedure: Meds ordered this encounter  Medications   lidocaine (XYLOCAINE) 2 % (with pres) injection 400 mg   pentafluoroprop-tetrafluoroeth (GEBAUERS)  aerosol   ropivacaine (PF) 2 mg/mL (0.2%) (NAROPIN) injection 9 mL   triamcinolone acetonide (KENALOG-40) injection 40 mg   lidocaine (XYLOCAINE) 5 % ointment    Sig: Apply 1 application. topically 4 (four) times daily as needed for moderate pain. Maximum dose: 5 g/application (approximately 6 inches of ointment); 20 g/day    Dispense:  35.44 g    Refill:  2    Fill one day early if pharmacy is closed on scheduled refill date. May substitute for generic if available.   Medications administered: Meriel Pica had no medications administered during this visit.  See the medical record for exact dosing, route, and time of  administration.  Follow-up plan:   Return in about 2 weeks (around 09/26/2021) for Proc-day (T,Th), (VV), (PPE).       Interventional Therapies  Risk  Complexity Considerations:   Estimated body mass index is 22.15 kg/m as calculated from the following:   Height as of this encounter: 4\' 10"  (1.473 m).   Weight as of this encounter: 106 lb (48.1 kg). NOTE: Eliquis Anticoagulation (Stop:3 days  Restart: 6 hrs)  Advanced age  Latex allergy   Planned  Pending:   Therapeutic right lumbar facet block    Under consideration:   Palliative right lumbar facet RFA #3   Completed:   Diagnostic/therapeutic right erector spinae muscle trigger point x1 (07/20/2021) (0/0/0)  Palliative/therapeutic caudal ESI x1 (07/21/2019) (100/100/90/90)  Palliative right lumbar facet (L2, L3, L4, L5, & S1) MBB x9 (09/07/2020) (100/100/90/75)  Palliative/Therapeutic right lumbar facet RFA x3 (01/28/21) (2nd: 100/100/100/90)  Palliative right sacroiliac joint block x4 (01/24/2021) (100/100/0/0)  Palliative/therapeutic right-sided SI joint RFA x1 (03/27/2018) (100/90/0)    Therapeutic  Palliative (PRN) options:   Palliative/therapeutic caudal ESI #2  Palliative right lumbar facet block #10  Palliative/Therapeutic right lumbar facet RFA #3      Recent Visits Date Type Provider Dept  08/28/21 Office Visit Delano Metz, MD [facility]  08/03/21 Office Visit Delano Metz, MD [facility]  07/20/21 Office Visit Delano Metz, MD [facility]  Showing recent visits within past 90 days and meeting all other requirements Today's Visits Date Type Provider Dept  09/12/21 Procedure visit Delano Metz, MD [facility]  Showing today's visits and meeting all other requirements Future Appointments Date Type Provider Dept  09/28/21 Appointment Delano Metz, MD [facility]  Showing future appointments within next 90 days and meeting all other  requirements  Disposition: Discharge home  Discharge (Date  Time): 09/12/2021; 1105 hrs.   Primary Care Physician: Glori Luis, MD Location: [facility] Note by: Oswaldo Done, MD Date: 09/12/2021; Time: 11:11 AM  Disclaimer:  Medicine is not an Visual merchandiser. The only guarantee in medicine is that nothing is guaranteed. It is important to note that the decision to proceed with this intervention was based on the information collected from the patient. The Data and conclusions were drawn from the patient's questionnaire, the interview, and the physical examination. Because the information was provided in large part by the patient, it cannot be guaranteed that it has not been purposely or unconsciously manipulated. Every effort has been made to obtain as much relevant data as possible for this evaluation. It is important to note that the conclusions that lead to this procedure are derived in large part from the available data. Always take into account that the treatment will also be dependent on availability of resources and existing treatment guidelines,  considered by other Pain Management Practitioners as being common knowledge and practice, at the time of the intervention. For Medico-Legal purposes, it is also important to point out that variation in procedural techniques and pharmacological choices are the acceptable norm. The indications, contraindications, technique, and results of the above procedure should only be interpreted and judged by a Board-Certified Interventional Pain Specialist with extensive familiarity and expertise in the same exact procedure and technique.

## 2021-09-12 NOTE — Patient Instructions (Signed)
____________________________________________________________________________________________ ? ?Virtual Visits  ? ?What is a Manufacturing systems engineer Visit"? ?It is a Metallurgist (medical visit) that takes place on real time (NOT TEXT or E-MAIL) over the telephone or computer device (desktop, laptop, tablet, smart phone, etc.). It allows for more location flexibility between the patient and the healthcare provider. ? ?Who decides when these types of visits will be used? ?The physician. ? ?Who is eligible for these types of visits? ?Only those patients that can be reliably reached over the telephone. ? ?What do you mean by reliably? ?We do not have time to call everyone multiple times, therefore those that tend to screen calls and then call back later are not suitable candidates for this system. We understand how people are reluctant to pickup on "unknown" calls, therefore, we suggest adding our telephone numbers to your list of "CONTACT(s)". This way, you should be able to readily identify our calls when you receive one. All of our numbers are available below.  ? ?Who is not eligible? ?This option is not available for medication management encounters, specially for controlled substances. Patients on pain medications that fall under the category of controlled substances have to come in for "Face-to-Face" encounters. This is required for mandatory monitoring of these substances. You may be asked to provide a sample for an unannounced urine drug screening test (UDS), and we will need to count your pain pills. Not bringing your pills to be counted may result in no refill. Obviously, neither one of these can be done over the phone. ? ?When will this type of visits be used? ?You can request a virtual visit whenever you are physically unable to attend a regular appointment. The decision will be made by the physician (or healthcare provider) on a case by case basis.  ? ?At what time will I be called? ?This is an  excellent question. The providers will try to call you whenever they have time available. Do not expect to be called at any specific time. The secretaries will assign you a time for your virtual visit appointment, but this is done simply to keep a list of those patients that need to be called, but not for the purpose of keeping a time schedule. Be advised that the call may come in anytime during the day, between the hours of 8:00 AM and 8::00 PM, depending on provider availability. We do understand that the system is not perfect. If you are unable to be available that day on a moments notice, then request an "in-person" appointment rather than a "virtual visit". ? ?Can I request my medication visits to be "Virtual"? ?Yes you may request it, but the decision is entirely up to the healthcare provider. Control substances require specific monitoring that requires Face-to-Face encounters. The number of encounters  and the extent of the monitoring is determined on a case by case basis. ? ?Add a new contact to your smart phone and label it "PAIN CLINIC" ?Under this contact add the following numbers: ?Main: [phone] (Official Contact Number) ?Nurses: [phone] (These are outgoing only calling systems. Do not call this number.) ?Dr. Dossie Arbour: [phone] or [phone] (Outgoing calls only. Do not call this number.) ? ?____________________________________________________________________________________________ ? ____________________________________________________________________________________________ ? ?Post-Procedure Discharge Instructions ? ?Instructions: ?Apply ice:  ?Purpose: This will minimize any swelling and discomfort after procedure.  ?When: Day of procedure, as soon as you get home. ?How: Fill a plastic sandwich bag with crushed ice. Cover it with a small towel and apply to injection  site. ?How long: (15 min on, 15 min off) Apply for 15 minutes then remove x 15 minutes.  Repeat sequence on day of  procedure, until you go to bed. ?Apply heat:  ?Purpose: To treat any soreness and discomfort from the procedure. ?When: Starting the next day after the procedure. ?How: Apply heat to procedure site starting the day following the procedure. ?How long: May continue to repeat daily, until discomfort goes away. ?Food intake: Start with clear liquids (like water) and advance to regular food, as tolerated.  ?Physical activities: Keep activities to a minimum for the first 8 hours after the procedure. After that, then as tolerated. ?Driving: If you have received any sedation, be responsible and do not drive. You are not allowed to drive for 24 hours after having sedation. ?Blood thinner: (Applies only to those taking blood thinners) You may restart your blood thinner 6 hours after your procedure. ?Insulin: (Applies only to Diabetic patients taking insulin) As soon as you can eat, you may resume your normal dosing schedule. ?Infection prevention: Keep procedure site clean and dry. Shower daily and clean area with soap and water. ?Post-procedure Pain Diary: Extremely important that this be done correctly and accurately. Recorded information will be used to determine the next step in treatment. For the purpose of accuracy, follow these rules: ?Evaluate only the area treated. Do not report or include pain from an untreated area. For the purpose of this evaluation, ignore all other areas of pain, except for the treated area. ?After your procedure, avoid taking a long nap and attempting to complete the pain diary after you wake up. Instead, set your alarm clock to go off every hour, on the hour, for the initial 8 hours after the procedure. Document the duration of the numbing medicine, and the relief you are getting from it. ?Do not go to sleep and attempt to complete it later. It will not be accurate. If you received sedation, it is likely that you were given a medication that may cause amnesia. Because of this, completing the  diary at a later time may cause the information to be inaccurate. This information is needed to plan your care. ?Follow-up appointment: Keep your post-procedure follow-up evaluation appointment after the procedure (usually 2 weeks for most procedures, 6 weeks for radiofrequencies). DO NOT FORGET to bring you pain diary with you.  ? ?Expect: (What should I expect to see with my procedure?) ?From numbing medicine (AKA: Local Anesthetics): Numbness or decrease in pain. You may also experience some weakness, which if present, could last for the duration of the local anesthetic. ?Onset: Full effect within 15 minutes of injected. ?Duration: It will depend on the type of local anesthetic used. On the average, 1 to 8 hours.  ?From steroids (Applies only if steroids were used): Decrease in swelling or inflammation. Once inflammation is improved, relief of the pain will follow. ?Onset of benefits: Depends on the amount of swelling present. The more swelling, the longer it will take for the benefits to be seen. In some cases, up to 10 days. ?Duration: Steroids will stay in the system x 2 weeks. Duration of benefits will depend on multiple posibilities including persistent irritating factors. ?Side-effects: If present, they may typically last 2 weeks (the duration of the steroids). ?Frequent: Cramps (if they occur, drink Gatorade and take over-the-counter Magnesium 450-500 mg once to twice a day); water retention with temporary weight gain; increases in blood sugar; decreased immune system response; increased appetite. ?Occasional: Facial flushing (red, warm  cheeks); mood swings; menstrual changes. ?Uncommon: Long-term decrease or suppression of natural hormones; bone thinning. (These are more common with higher doses or more frequent use. This is why we prefer that our patients avoid having any injection therapies in other practices.)  ?Very Rare: Severe mood changes; psychosis; aseptic necrosis. ?From procedure: Some  discomfort is to be expected once the numbing medicine wears off. This should be minimal if ice and heat are applied as instructed. ? ?Call if: (When should I call?) ?You experience numbness and weakness that get

## 2021-09-12 NOTE — Progress Notes (Addendum)
Safety precautions to be maintained throughout the outpatient stay will include: orient to surroundings, keep bed in low position, maintain call bell within reach at all times, provide assistance with transfer out of bed and ambulation.  ? ?8768 IV attempt in right forearm unsuccessful and extended time needed for site to stop bleeding. Pressure applied to site and Dr. Dossie Arbour notified. Orders to use 25gu needles and not to restart IV given per MD. Patient states she stopped her ELIQUIS 3 days ago. ?

## 2021-09-13 ENCOUNTER — Telehealth: Payer: Self-pay | Admitting: *Deleted

## 2021-09-13 NOTE — Telephone Encounter (Signed)
No problems post procedure. 

## 2021-09-16 ENCOUNTER — Other Ambulatory Visit: Payer: Self-pay | Admitting: Family Medicine

## 2021-09-17 ENCOUNTER — Telehealth: Payer: Self-pay | Admitting: Family Medicine

## 2021-09-18 NOTE — Telephone Encounter (Signed)
I will await her death certificate.  ?

## 2021-09-18 NOTE — Telephone Encounter (Signed)
Amanda Soto From [place] called in wondering if Dr. Caryl Bis is aware of Pt passing.... Advise Amanda Soto of note below... Amanda Soto stated that she will send over everything... Amanda Soto stated that Dr. Caryl Bis should be able to see the information now...  ?

## 2021-09-22 ENCOUNTER — Ambulatory Visit: Payer: Medicare Other | Admitting: Family Medicine

## 2021-09-28 ENCOUNTER — Telehealth: Payer: Medicare Other | Admitting: Pain Medicine

## 2021-09-30 NOTE — Telephone Encounter (Signed)
Received a call from access nurse to report that Ms Amanda Soto has died. ?I spoke with officer at the scene, Ms Amanda Soto. According to caller, her son found her asleep at 12:30 pm today, she did not wake up, so he called 911.  ?No signs of trauma. ?Body will be transport to the funeral home and death certificate documentation will be sent to her PCP. ?Dlisa Barnwell [place], MD ? ? ?

## 2021-09-30 DEATH — deceased

## 2021-10-02 ENCOUNTER — Ambulatory Visit: Payer: Medicare Other

## 2022-01-31 ENCOUNTER — Encounter: Payer: Medicare Other | Admitting: Pain Medicine
